# Patient Record
Sex: Male | Born: 1940 | Race: White | Hispanic: No | Smoking: Former smoker
Health system: Southern US, Community
[De-identification: ages and names within clinical notes are randomized; demographics above are authoritative.]

## PROBLEM LIST (undated history)

## (undated) DIAGNOSIS — I723 Aneurysm of iliac artery: Secondary | ICD-10-CM

## (undated) DIAGNOSIS — I10 Essential (primary) hypertension: Secondary | ICD-10-CM

## (undated) DIAGNOSIS — I6529 Occlusion and stenosis of unspecified carotid artery: Secondary | ICD-10-CM

## (undated) DIAGNOSIS — I219 Acute myocardial infarction, unspecified: Secondary | ICD-10-CM

## (undated) DIAGNOSIS — I2510 Atherosclerotic heart disease of native coronary artery without angina pectoris: Secondary | ICD-10-CM

## (undated) DIAGNOSIS — I779 Disorder of arteries and arterioles, unspecified: Secondary | ICD-10-CM

## (undated) DIAGNOSIS — K635 Polyp of colon: Secondary | ICD-10-CM

## (undated) DIAGNOSIS — I739 Peripheral vascular disease, unspecified: Secondary | ICD-10-CM

## (undated) DIAGNOSIS — E785 Hyperlipidemia, unspecified: Secondary | ICD-10-CM

## (undated) DIAGNOSIS — J449 Chronic obstructive pulmonary disease, unspecified: Secondary | ICD-10-CM

## (undated) HISTORY — DX: Peripheral vascular disease, unspecified: I73.9

## (undated) HISTORY — PX: HIATAL HERNIA REPAIR: SHX195

## (undated) HISTORY — DX: Essential (primary) hypertension: I10

## (undated) HISTORY — DX: Occlusion and stenosis of unspecified carotid artery: I65.29

## (undated) HISTORY — DX: Aneurysm of iliac artery: I72.3

## (undated) HISTORY — DX: Hyperlipidemia, unspecified: E78.5

## (undated) HISTORY — DX: Acute myocardial infarction, unspecified: I21.9

## (undated) HISTORY — DX: Disorder of arteries and arterioles, unspecified: I77.9

## (undated) HISTORY — DX: Polyp of colon: K63.5

---

## 2003-07-18 ENCOUNTER — Ambulatory Visit (HOSPITAL_COMMUNITY): Admission: RE | Admit: 2003-07-18 | Discharge: 2003-07-18 | Payer: Self-pay | Admitting: Internal Medicine

## 2008-05-31 ENCOUNTER — Encounter: Payer: Self-pay | Admitting: Gastroenterology

## 2008-05-31 ENCOUNTER — Encounter (INDEPENDENT_AMBULATORY_CARE_PROVIDER_SITE_OTHER): Payer: Self-pay | Admitting: *Deleted

## 2008-06-27 ENCOUNTER — Encounter: Payer: Self-pay | Admitting: Internal Medicine

## 2008-06-27 ENCOUNTER — Ambulatory Visit: Payer: Self-pay | Admitting: Gastroenterology

## 2008-06-27 DIAGNOSIS — F1010 Alcohol abuse, uncomplicated: Secondary | ICD-10-CM | POA: Insufficient documentation

## 2008-06-27 DIAGNOSIS — R161 Splenomegaly, not elsewhere classified: Secondary | ICD-10-CM | POA: Insufficient documentation

## 2008-06-27 DIAGNOSIS — Z860100 Personal history of colon polyps, unspecified: Secondary | ICD-10-CM | POA: Insufficient documentation

## 2008-06-27 DIAGNOSIS — I10 Essential (primary) hypertension: Secondary | ICD-10-CM | POA: Insufficient documentation

## 2008-06-27 DIAGNOSIS — E785 Hyperlipidemia, unspecified: Secondary | ICD-10-CM | POA: Insufficient documentation

## 2008-06-29 ENCOUNTER — Encounter: Payer: Self-pay | Admitting: Internal Medicine

## 2008-07-11 ENCOUNTER — Ambulatory Visit (HOSPITAL_COMMUNITY): Admission: RE | Admit: 2008-07-11 | Discharge: 2008-07-11 | Payer: Self-pay | Admitting: Internal Medicine

## 2008-07-11 ENCOUNTER — Encounter: Payer: Self-pay | Admitting: Internal Medicine

## 2008-07-11 ENCOUNTER — Ambulatory Visit: Payer: Self-pay | Admitting: Internal Medicine

## 2008-07-12 ENCOUNTER — Encounter: Payer: Self-pay | Admitting: Internal Medicine

## 2008-07-26 ENCOUNTER — Telehealth: Payer: Self-pay | Admitting: Gastroenterology

## 2008-07-27 ENCOUNTER — Encounter: Payer: Self-pay | Admitting: Internal Medicine

## 2008-08-02 ENCOUNTER — Ambulatory Visit (HOSPITAL_COMMUNITY): Admission: RE | Admit: 2008-08-02 | Discharge: 2008-08-02 | Payer: Self-pay | Admitting: Internal Medicine

## 2009-04-12 ENCOUNTER — Ambulatory Visit: Payer: Self-pay | Admitting: Vascular Surgery

## 2009-04-23 ENCOUNTER — Ambulatory Visit: Payer: Self-pay | Admitting: Vascular Surgery

## 2009-04-23 ENCOUNTER — Ambulatory Visit (HOSPITAL_COMMUNITY)
Admission: RE | Admit: 2009-04-23 | Discharge: 2009-04-23 | Payer: Self-pay | Source: Home / Self Care | Admitting: Vascular Surgery

## 2009-04-23 HISTORY — PX: OTHER SURGICAL HISTORY: SHX169

## 2009-05-10 ENCOUNTER — Ambulatory Visit: Payer: Self-pay | Admitting: Vascular Surgery

## 2009-11-13 ENCOUNTER — Ambulatory Visit: Payer: Self-pay | Admitting: Vascular Surgery

## 2010-04-30 NOTE — Progress Notes (Signed)
Summary: large liver  ---- Converted from flag ---- ---- 07/26/2008 10:02 AM, Cloria Spring LPN wrote: Dr. Sherryll Burger left vm and asked for copy of Abd [place], lab work, and wanted to  know if we will be following up on pt's enlarged liver. I faxed copy of the  [place] to Dr. Sherryll Burger. I do not see that labs were ordered. ------------------------------  Yes, we will f/u on enlarged Right hepatic lobe.  We did not do any labs here.  Discussed with Dr. Jena Gauss, he recommends CT a/p with IV/Oral contrast for further evaluation of enlarged Right hepatic lobe in setting of etoh use. Please let Dr. Margaretmary Eddy nurse know.  Spoke with patient and wife.  Patient cut back alcohol to 2 beers per day, goal of no alcohol. Please schedule CT A/P with IV/ORAL contrast, patient not available May 12-16, May 26, June 5-12.  He will need f/u OV with RMR to go over results.    Appended Document: large liver Dr. Sherryll Burger aware- didnt receive U/S fax. Faxed U/S report again. He is requesting a copy of CT when available as well.   Appended Document: large liver Pt scheduled for CT on 07/31/08 at 1:00 pm

## 2010-06-17 LAB — POCT I-STAT, CHEM 8
BUN: 19 mg/dL (ref 6–23)
Calcium, Ion: 1.14 mmol/L (ref 1.12–1.32)
Chloride: 103 mEq/L (ref 96–112)
Creatinine, Ser: 1 mg/dL (ref 0.4–1.5)
Glucose, Bld: 104 mg/dL — ABNORMAL HIGH (ref 70–99)
HCT: 35 % — ABNORMAL LOW (ref 39.0–52.0)
Hemoglobin: 11.9 g/dL — ABNORMAL LOW (ref 13.0–17.0)
Potassium: 4.3 mEq/L (ref 3.5–5.1)
Sodium: 134 mEq/L — ABNORMAL LOW (ref 135–145)
TCO2: 26 mmol/L (ref 0–100)

## 2010-07-09 LAB — CREATININE, SERUM
Creatinine, Ser: 0.97 mg/dL (ref 0.4–1.5)
GFR calc Af Amer: >60 mL/min (ref >60)
GFR calc non Af Amer: >60 mL/min (ref >60)

## 2010-08-13 NOTE — Procedures (Signed)
CAROTID DUPLEX EXAM   INDICATION:  Evaluate carotid artery disease.   HISTORY:  Diabetes:  No.  Cardiac:  No.  Hypertension:  Yes.  Smoking:  Previous.  Previous Surgery:  No.  CV History:  No.  Amaurosis Fugax No, Paresthesias No, Hemiparesis No                                       RIGHT             LEFT  Brachial systolic pressure:         146               155  Brachial Doppler waveforms:         Triphasic         Triphasic  Vertebral direction of flow:        Antegrade         Antegrade  DUPLEX VELOCITIES (cm/sec)  CCA peak systolic                   101               101  ECA peak systolic                   242               197  ICA peak systolic                   215 mid           126 mid  ICA end diastolic                   77                52  PLAQUE MORPHOLOGY:                  Calcific          Heterogeneous  PLAQUE AMOUNT:                      Moderate          Moderate  PLAQUE LOCATION:                    ICA / ECA           ICA / ECA   IMPRESSION:  1. The right internal carotid artery suggests 60%-79% stenosis.  The      proximal internal carotid artery was not imaged due to shadowing.  2. Left internal carotid artery suggests 40%-59% stenosis.  3. Bilateral external carotid artery stenosis.  4. Antegrade flow in bilateral vertebrals.   ___________________________________________  Di Kindle. Edilia Bo, M.D.   CB/MEDQ  D:  05/10/2009  T:  05/10/2009  Job:  [id number]

## 2010-08-13 NOTE — Procedures (Signed)
CAROTID DUPLEX EXAM   INDICATION:  Follow up known carotid disease.   HISTORY:  Diabetes:  No.  Cardiac:  No.  Hypertension:  Yes.  Smoking:  Previous.  Previous Surgery:  No.  CV History:  Asymptomatic.  Amaurosis Fugax No, Paresthesias No, Hemiparesis No                                       RIGHT             LEFT  Brachial systolic pressure:         182               181  Brachial Doppler waveforms:         Normal.           Normal.  Vertebral direction of flow:        Antegrade.        Antegrade.  DUPLEX VELOCITIES (cm/sec)  CCA peak systolic                   95                104  ECA peak systolic                   193               230  ICA peak systolic                   205               128  ICA end diastolic                   67                55  PLAQUE MORPHOLOGY:                  Calcific          Calcific  PLAQUE AMOUNT:                      Moderate to severe                  Moderate  PLAQUE LOCATION:                    ICA/ECA           ICA/ECA   IMPRESSION:  1. Doppler velocities suggest 60-79% stenosis in the right ICA;      however, difficult to image due to calcific shadowing and high      bifurcation.  2. Doppler velocities suggest 40-59% stenosis in the left ICA.  3. Antegrade flow noted in the bilateral vertebral arteries.  4. No significant changes from previous exam.   ___________________________________________  Di Kindle. Edilia Bo, M.D.   NT/MEDQ  D:  11/13/2009  T:  11/13/2009  Job:  [id number]

## 2010-08-13 NOTE — Op Note (Signed)
NAME:  William [name], William [name]               ACCOUNT NO.:  [id number]   MEDICAL RECORD NO.:  [id number]          PATIENT TYPE:  AMB   LOCATION:  DAY                           FACILITY:  [facility]   PHYSICIAN:  R. Roetta Sessions, M.D. DATE OF BIRTH:  1940-08-24   DATE OF PROCEDURE:  07/10/2008  DATE OF DISCHARGE:                               OPERATIVE REPORT   PROCEDURE:  Colonoscopy with biopsy.   INDICATIONS FOR PROCEDURE:  A 69 year old gentleman with a history  colonic adenomas.  Last colonoscopy was 5 years ago.  He is devoid of  any lower GI tract symptoms.  Now, he is here for surveillance.  The  risks, benefits, alternatives and limitations have been reviewed,  questions answered.  Please see the documentation in the medical record.   PROCEDURE NOTE:  O2 saturation, blood pressure, pulse and respirations  were monitored throughout the entire procedure.  Conscious sedation;  Versed 4 mg IV, Demerol 75 mg IV in divided doses.   INSTRUMENT:  Pentax video chip system.   FINDINGS:  Digital rectal exam revealed no abnormalities.   ENDOSCOPIC FINDINGS:  The prep was adequate.  Colon:  The colonic mucosa  was surveyed from the rectosigmoid junction through the left transverse  and right colon, appendiceal orifice, ileocecal valve and cecum.  These  structures well seen and photographed for the record.  From this level,  the scope was slowly and cautiously withdrawn.  All previous mentioned  mucosal surfaces were again seen.  The patient was noted to have a  pancolonic diverticula and three diminutive polyps in the mid sigmoid.  The remainder colonic mucosa appeared normal.  These polyps were cold  biopsy/removed.  The scope was pulled down the rectum where a thorough  examination of the rectal mucosa, including a retroflexed view of the  anal verge demonstrated no abnormalities.  The patient tolerated the  procedure well and was reacted in endoscopy.  Cecal withdrawal time 9  minutes.   IMPRESSION:  1. Normal rectum.  2. Diminutive sigmoid polyp, status post cold biopsy removal.  3. Pancolonic diverticula.  The remaining colonic mucosa appeared      normal.   RECOMMENDATIONS:  1. Diverticulosis literature provided to Mr. Heckmann.  2. Follow-up on path.  3. Further recommendations to follow.      Jonathon Bellows, M.D.  Electronically Signed     RMR/MEDQ  D:  07/11/2008  T:  07/11/2008  Job:  [id number]   cc:   Kirstie Peri, MD  Fax: [phone]

## 2010-08-13 NOTE — Assessment & Plan Note (Signed)
OFFICE VISIT   William [name], William [name]  DOB:  [birth year]                                       05/10/2009  ZOXWR#:[id number]   I saw the patient in the office today for continued follow-up of his  peripheral vascular disease.  This is a pleasant 70 year old gentleman  who I had seen in consultation on April 12, 2009 with bilateral lower  extremity claudication.  His symptoms have progressed significantly and  he had an ABI of 50% on the right and 40% on the left.  Therefore we  pursued arteriography to further evaluate his peripheral vascular  disease.  He underwent an arteriogram on April 23, 2009.  He comes in  for follow-up visit.  He states that actually his symptoms in his legs  have improved slightly especially on the right side.  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  He states that he can walk approximately a quarter of mile  before experiencing bilateral calf claudication symptoms.  His symptoms  are more significant on the left side at this point.  He has no  significant thigh or hip claudication.  He has no significant rest pain.  He can now walk further than when he was initially evaluated in January.  The symptoms are aggravated by walking and alleviated by rest.  There  are no other associated symptoms.  Of note, he also has had a carotid  duplex scan today as he had a left carotid bruit previously.  He has no  history of stroke, TIAs, expressive or receptive aphasia, or amaurosis  fugax.   SOCIAL HISTORY:  He is married, he has two children.  He quit tobacco in  1993.   REVIEW OF SYSTEMS:  CARDIOVASCULAR:  He has had no recent chest pain,  chest pressure, palpitations or arrhythmias.  He has had no history of  stroke or TIAs.  He has had no DVT or phlebitis.  PULMONARY:  He has had no productive cough, bronchitis, asthma or  wheezing recently.   PHYSICAL EXAMINATION:  This is a  pleasant 70 year old gentleman who  appears stated age.  Blood pressure 157/64, heart rate is 80,  temperature is 98.2.  Lungs are clear bilaterally to auscultation  without rales, rhonchi or wheezing.  Cardiovascular examination; he has  a left carotid bruit.  He has a regular rate and rhythm without murmur  appreciated.  He has palpable femoral pulses.  I cannot palpate  popliteal or pedal pulses on either side.  He has no significant lower  extremity swelling.  Abdomen:  Soft and nontender with normal pitched  bowel sounds.  No masses appreciated.  Neurologic:  He has no focal  weakness or paresthesias.   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  He had some  moderate superficial femoral artery occlusive disease on the right with  reconstitution of the below knee popliteal artery and three-vessel  runoff.  On the left side he had more diffuse disease with a long  segment superficial femoral artery occlusion.  There was reconstitution  of the distal SFA in above knee popliteal artery which had moderate  diffuse disease,  but then a reoccluded popliteal artery at the level of  the knee and only reconstituted his distal posterior tibial on the left.   Carotid duplex scan was done today.  I independently interpreted it and  this shows a 60%-79% right carotid stenosis with a 40%-59% left carotid  stenosis.  Both vertebral arteries are patent with normally directed  flow and arm pressures are essentially equal.   With respect to his carotid stenosis he understands we would not  consider carotid endarterectomy unless the stenosis progressed to  greater than 80% or he developed new neurologic symptoms.  I have  ordered follow-up duplex scan in 6 months and I will see him back at  that time.  He does know to continue taking his  aspirin.   With respect to his peripheral vascular disease, his symptoms have  actually improved and I have encouraged him to do as much walking as  possible.  If symptoms progress on the left side, he could potentially  require a fem to distal posterior tibial bypass versus bypass into a  blind above knee popliteal segment.  However, currently his symptoms are  improving and hopefully will continue to improve with continued  structured walking program.   I will see him back in 6 months with follow-up ABIs and carotid duplex  scan.  He knows to call sooner if he has problems.     Di Kindle. Edilia Bo, M.D.  Electronically Signed   CSD/MEDQ  D:  05/10/2009  T:  05/11/2009  Job:  [id number]   cc:   Kirstie Peri, MD

## 2010-08-13 NOTE — Consult Note (Signed)
NEW PATIENT CONSULTATION   William [name], William [name]  DOB:  02-25-1941                                       04/12/2009  ZOXWR#:[id number]   I saw the patient in the office today in consultation concerning his  bilateral lower extremity claudication.  He was referred by Dr. Sherryll Burger.  I  had actually seen him in the past with bilateral extremity claudication  which was quite stable and he was last seen in October of 2007.  He was  then lost to followup.  He presents now with a 4-5 year history of  bilateral calf claudication which is equal on both sides.  He has no  significant thigh or hip claudication.  His symptoms occur at  approximately 1 block and are brought on by ambulation and relieved with  rest.  There are no other aggravating or alleviating factors.  There are  no associated symptoms.  Over the last several months the claudication  symptoms have gradually progressed and he can only walk a short  distance.  He has had no history of rest pain and no history of  nonhealing ulcers.   PAST MEDICAL HISTORY:  Is significant for type 2 diabetes, hypertension  and hypercholesterolemia.  He denies any history of previous myocardial  infarction, history of congestive heart failure or history of COPD.   FAMILY HISTORY:  His father died from a pulmonary embolus.  He is  unaware of any history of premature cardiovascular disease.   SOCIAL HISTORY:  He is married.  He has two children.  He works as an  Theatre manager.  He quit tobacco in August of 1993.  He has two to  four drinks a day.   MEDICATIONS:  Are documented on the medical history form in his chart.  He is on aspirin.   REVIEW OF SYSTEMS:  GENERAL:  He has had some weight gain recently  because he has been unable to walk because of his claudication symptoms.  He has had no change in his appetite.  He is 165 pounds and 5 feet 7  inches tall.  CARDIOVASCULAR:  He has had no chest pain, chest pressure,  palpitations  or arrhythmias.  He has had no history of stroke, TIAs or amaurosis  fugax.  He has had no history of DVT or phlebitis.  Pulmonary, GI, GU, neurologic, musculoskeletal, psychiatric, ENT,  hematologic and integumentary review of systems is unremarkable and is  documented on the medical history form in his chart.   PHYSICAL EXAMINATION:  General:  This is a pleasant 70 year old  gentleman who appears his stated age.  Vital signs:  Heart rate is 72,  blood pressure 158/81, sat is 94%.  HEENT:  Pupils are equal, round and  reactive to light and accommodation.  Extraocular motions are intact.  Conjunctivae are normal.  Neck:  Neck is supple with no jugular venous  distention.  Lungs:  Are clear bilaterally to auscultation without  rales, rhonchi or wheezing.  Cardiovascular:  He has a soft left carotid  bruit.  He has a regular rate and rhythm without murmur or gallop  appreciated.  He has palpable radial pulses.  He has a slightly  diminished right femoral pulse and a normal left femoral pulse.  Cannot  palpate popliteal or pedal pulses on either side.  Abdomen:  Soft and  nontender with normal pitched bowel sounds.  No masses appreciated.  I  cannot appreciate an aneurysm.  Musculoskeletal:  He has no major  deformities or cyanosis.  Neurological:  He has no focal weakness or  paresthesias.  Lymphatics:  He has no significant cervical, axillary or  inguinal lymphadenopathy.  Skin:  He has no ulcers or rashes.   I have reviewed his arterial Doppler study which was done at [facility] and this shows an ABI of 50% on the right and 40% on the left.  He has diffuse monophasic waveforms throughout the right lower extremity  with monophasic waveforms in the right groin consistent with some  proximal iliac disease.  On the left side he has a triphasic signal in  the groin with dampened signals below this.   Given the progression of his symptoms and now with an ABI of 50% on  the  right and 40% on the left I think he has had progression of his  peripheral vascular disease.  I suspect he has proximal iliac artery  occlusive disease on the right and perhaps some infrainguinal disease  below this.  On the left side I think he has predominantly infrainguinal  arterial occlusive disease.  His symptoms are preventing him from  working some and also caused him to gain some weight because he is  unable to ambulate which he used to do quite a bit.  For this reason he  would like to pursue arteriography to see what options he has for  revascularization.  We have discussed the indications for arteriography  and the potential complications including but not limited to bleeding,  arterial injury or renal insufficiency.  In addition, if he has an iliac  lesion amenable to angioplasty I have explained that we could  potentially address this at the same time.  We have discussed the  indications for iliac angioplasty and potential stenting and the  potential complications including but not limited to arterial injury,  dissection or arterial thrombosis.  All of his questions were answered  and he is agreeable to proceed.  The procedure has been scheduled for  04/23/2009.  We will make further recommendations pending these results.  If he does have an iliac lesion amenable to angioplasty and stenting  then he will probably need to be on Plavix for 3-4 months after.  In  addition, when he comes in for his next visit he will need to have a  carotid duplex scan to follow up his left carotid bruit.     Di Kindle. Edilia Bo, M.[name].  Electronically Signed   CSD/MEDQ  [name]:  04/12/2009  T:  04/13/2009  Job:  [id number]   cc:   Kirstie Peri, MD

## 2010-08-16 NOTE — Op Note (Signed)
NAME:  William [name], William [name]                         ACCOUNT NO.:  [id number]   MEDICAL RECORD NO.:  [id number]                   PATIENT TYPE:  AMB   LOCATION:  DAY                                  FACILITY:  [facility]   PHYSICIAN:  R. Roetta Sessions, M.D.              DATE OF BIRTH:  [birth year]   DATE OF PROCEDURE:  07/18/2003  DATE OF DISCHARGE:                                 OPERATIVE REPORT   PROCEDURE:  Colonoscopy and snare polypectomy.   ENDOSCOPIST:  Gerrit Friends. Rourk, M.D.   INDICATIONS FOR PROCEDURE:  The patient is a 70 year old gentleman sent over  by Dr. Sherryll Burger for colonoscopy for cancer screening purposes.  He had  sigmoidoscopy with Dr. Angela Nevin in [place], 5 years ago.  He was told that  he had polyps but did not require colonoscopy.  He is devoid of any lower GI  tract symptoms.  There is no family history of colorectal neoplasia.  Colonoscopy is now being done.  This approach has been discussed with the  patient at length.  The potential risks, benefits, and alternatives have  been reviewed; questions answered.  Please see my handwritten H&P.   PROCEDURE NOTE:  O2 saturation, blood pressure, pulse and respirations were  monitored throughout the entirety of the procedure.  Conscious sedation:  Versed 3 mg IV, Demerol 50 mg IV in divided doses.   INSTRUMENT:  Olympus adult __________ colonoscope.   FINDINGS:  A digital rectal exam revealed no abnormalities.   ENDOSCOPIC FINDINGS:  The prep was good.   RECTUM:  Examination of the rectal mucosa including the retroflex view of  the anal verge revealed no abnormalities.   COLON:  The colonic mucosa was surveyed from the rectosigmoid junction  through the left transverse and right colon to the area of the appendiceal  orifice, ileocecal valve, and cecum.  These structures were well seen and  photographed for the record.   From this level the scope was slowly withdrawn.  All previously mentioned  mucosal surfaces were again  seen.  The patient was noted to have left-sided  and right-sided diverticula.  There was a 1.5 cm pedunculated polyp on a  fold just immediately distal to the ileocecal valve.  There were 2 smaller 6  mm polyps on opposite walls at the hepatic flexure.  The 1.5 cm polyp, just  distal to the ileocecal valve, was removed with a single pass of snare  cautery.  Two cc of sterile saline was used to lift the polyp away from the  colonic wall prior to polypectomy.  It was recovered with a Lucina Mellow net through  the scope.  The 2 smaller polyps at the hepatic flexure were cold snared.  There was a questionable, small fleshy sessile polyp between the 2 folds in  the midascending colon which could not be confirmed.  The remainder of the  colonic mucosa appeared normal.   The patient  tolerated the procedure well and was reacted in endoscopy.   IMPRESSION:  1. Normal rectum.  2. Left-sided and right-sided diverticula.  3. Polyps at the hepatic flexure and proximal right colon as described     above.  Removed with a snare.  4. Persisting small, sessile polyp at midright colon could not be confirmed.  5. The remainder of the colonic mucosa appeared normal.   RECOMMENDATIONS:  1. No aspirin or arthritis medications for the next 10 days.  2. Follow up on path.  3. Diverticulosis literature provided to Mr. Nanna.  4. Further recommendations to follow.      ___________________________________________                                            Jonathon Bellows, M.D.   RMR/MEDQ  D:  07/18/2003  T:  07/18/2003  Job:  [id number]   cc:   R. Roetta Sessions, M.D.  [address]  [place]  [place] [address]  Fax: [phone]   Kirstie Peri, MD  [address]  [place] [address]  Fax: [phone]

## 2010-11-19 ENCOUNTER — Encounter (INDEPENDENT_AMBULATORY_CARE_PROVIDER_SITE_OTHER): Payer: Medicare Other

## 2010-11-19 ENCOUNTER — Other Ambulatory Visit (INDEPENDENT_AMBULATORY_CARE_PROVIDER_SITE_OTHER): Payer: Medicare Other

## 2010-11-19 DIAGNOSIS — Z48812 Encounter for surgical aftercare following surgery on the circulatory system: Secondary | ICD-10-CM

## 2010-11-19 DIAGNOSIS — I6529 Occlusion and stenosis of unspecified carotid artery: Secondary | ICD-10-CM

## 2010-11-19 DIAGNOSIS — I70219 Atherosclerosis of native arteries of extremities with intermittent claudication, unspecified extremity: Secondary | ICD-10-CM

## 2010-11-28 NOTE — Procedures (Unsigned)
CAROTID DUPLEX EXAM  INDICATION:  Carotid disease.  HISTORY: Diabetes:  No Cardiac:  No Hypertension:  Yes Smoking:  Previous Previous Surgery:  No CV History:  Currently asymptomatic Amaurosis Fugax No, Paresthesias No, Hemiparesis No                                      RIGHT             LEFT Brachial systolic pressure:         157               153 Brachial Doppler waveforms:         Antegrade         Antegrade Vertebral direction of flow: DUPLEX VELOCITIES (cm/sec) CCA peak systolic                   81                82 ECA peak systolic                   161               224 ICA peak systolic                   172               119 ICA end diastolic                   56                40 PLAQUE MORPHOLOGY:                  Calcific          Calcific PLAQUE AMOUNT:                      Moderate          Moderate PLAQUE LOCATION:                    ICA/ECA/CCA       ICA/ECA/CCA  IMPRESSION: 1. Doppler velocities suggest 40% to 59% stenoses of the bilateral     proximal internal carotid arteries.  However, the percentage of     stenosis of the right internal carotid artery may be underestimated     due to calcific plaque shadowing. 2. Mild decrease of the right internal carotid artery velocities noted     when compared to the previous exam on 11/13/2009 with the left     internal carotid artery remaining stable.  ___________________________________________ Di Kindle. Edilia Bo, M.D.  CH/MEDQ  D:  11/20/2010  T:  11/20/2010  Job:  [id number]

## 2010-11-28 NOTE — Procedures (Unsigned)
AORTA-ILIAC DUPLEX EVALUATION  INDICATION:  Right external iliac artery stent.  HISTORY: Diabetes:  No. Cardiac:  No. Hypertension:  Yes. Smoking:  Previous. Previous Surgery:  Right external iliac artery stent on 04/23/2009.              SINGLE LEVEL ARTERIAL EXAM                             RIGHT                  LEFT Brachial: Anterior tibial: Posterior tibial: Peroneal: Ankle/brachial index: Previous ABI/date:  AORTA-ILIAC DUPLEX EXAM Aorta - Proximal     35 cm/s Aorta - Mid          52 cm/s Aorta - Distal       70 cm/s  RIGHT                                   LEFT 138 cm/s          CIA-PROXIMAL 90 cm/s           CIA-DISTAL                   HYPOGASTRIC 74 cm/s           EIA-PROXIMAL 73 cm/s           EIA-MID 105 cm/s          EIA-DISTAL  IMPRESSION: 1. Patent right external iliac artery stent with no increase in     Doppler velocities noted. 2. Bilateral ankle brachial indices noted on separate report.  ___________________________________________ Di Kindle. Edilia Bo, M.D.  CH/MEDQ  D:  11/20/2010  T:  11/20/2010  Job:  [id number]

## 2011-11-26 ENCOUNTER — Other Ambulatory Visit: Payer: Medicare Other

## 2011-11-26 ENCOUNTER — Ambulatory Visit: Payer: Medicare Other | Admitting: Vascular Surgery

## 2011-12-02 ENCOUNTER — Encounter: Payer: Self-pay | Admitting: Neurosurgery

## 2011-12-03 ENCOUNTER — Other Ambulatory Visit (INDEPENDENT_AMBULATORY_CARE_PROVIDER_SITE_OTHER): Payer: Medicare Other | Admitting: *Deleted

## 2011-12-03 ENCOUNTER — Ambulatory Visit (INDEPENDENT_AMBULATORY_CARE_PROVIDER_SITE_OTHER): Payer: Medicare Other | Admitting: Neurosurgery

## 2011-12-03 ENCOUNTER — Encounter: Payer: Self-pay | Admitting: Neurosurgery

## 2011-12-03 ENCOUNTER — Encounter (INDEPENDENT_AMBULATORY_CARE_PROVIDER_SITE_OTHER): Payer: Medicare Other | Admitting: *Deleted

## 2011-12-03 ENCOUNTER — Ambulatory Visit (INDEPENDENT_AMBULATORY_CARE_PROVIDER_SITE_OTHER): Payer: Medicare Other | Admitting: *Deleted

## 2011-12-03 VITALS — BP 130/84 | HR 68 | Resp 18 | Ht 66.0 in | Wt 168.5 lb

## 2011-12-03 DIAGNOSIS — I739 Peripheral vascular disease, unspecified: Secondary | ICD-10-CM

## 2011-12-03 DIAGNOSIS — Z48812 Encounter for surgical aftercare following surgery on the circulatory system: Secondary | ICD-10-CM

## 2011-12-03 DIAGNOSIS — I6529 Occlusion and stenosis of unspecified carotid artery: Secondary | ICD-10-CM

## 2011-12-03 NOTE — Progress Notes (Signed)
[facility] [place] Carotid Office Note  CC: Iliac stent, carotid surveillance with ABIs Referring Physician: Edilia Bo  History of Present Illness: 71 year old male patient of Dr. Edilia Bo who status post right external iliac artery stent placement in January 2011. Patient states he can ambulate but developed some claudication type symptoms around 100 yards but is able to continue to complete his ADLs in life without interruption. The patient denies any signs or symptoms of CVA, TIA, amaurosis fugax or any neural deficit. The patient has no rest pain or open ulcerations in his lower extremities.  Past Medical History  Diagnosis Date  . Hypertension   . Hyperlipidemia   . Peripheral arterial disease   . Diabetes mellitus     Type II  . Colon polyp   . Carotid artery occlusion     ROS: [x]  Positive   [ ]  Denies    General: [ ]  Weight loss, [ ]  Fever, [ ]  chills Neurologic: [ ]  Dizziness, [ ]  Blackouts, [ ]  Seizure [ ]  Stroke, [ ]  "Mini stroke", [ ]  Slurred speech, [ ]  Temporary blindness; [ ]  weakness in arms or legs, [ ]  Hoarseness Cardiac: [ ]  Chest pain/pressure, [ ]  Shortness of breath at rest [ ]  Shortness of breath with exertion, [ ]  Atrial fibrillation or irregular heartbeat Vascular: [x ] Pain in legs with walking, [ ]  Pain in legs at rest, [ ]  Pain in legs at night,  [ ]  Non-healing ulcer, [ ]  Blood clot in vein/DVT,   Pulmonary: [ ]  Home oxygen, [x ] Productive cough, [ ]  Coughing up blood, [ ]  Asthma,  [ ]  Wheezing Musculoskeletal:  [ ]  Arthritis, [ ]  Low back pain, [ ]  Joint pain Hematologic: [ ]  Easy Bruising, [ ]  Anemia; [ ]  Hepatitis Gastrointestinal: [ ]  Blood in stool, [ ]  Gastroesophageal Reflux/heartburn, [ ]  Trouble swallowing Urinary: [ ]  chronic Kidney disease, [ ]  on HD - [ ]  MWF or [ ]  TTHS, [ ]  Burning with urination, [ ]  Difficulty urinating Skin: [ ]  Rashes, [ ]  Wounds Psychological: [ ]  Anxiety, [ ]  Depression   Social  History History  Substance Use Topics  . Smoking status: Former Smoker -- 2.0 packs/day    Types: Cigarettes    Quit date: 11/17/1991  . Smokeless tobacco: Not on file  . Alcohol Use: 0.0 oz/week    2-4 drink(s) per week    Family History Family History  Problem Relation Age of Onset  . Heart disease Father     Heart Disease before age 18  . Pulmonary embolism Father   . Deep vein thrombosis Father   . Cancer Mother     Not on File  Current Outpatient Prescriptions  Medication Sig Dispense Refill  . Ascorbic Acid (VITAMIN C) 1000 MG tablet Take 1,000 mg by mouth daily.      Marland Kitchen aspirin 81 MG tablet Take 81 mg by mouth daily.      Marland Kitchen atorvastatin (LIPITOR) 10 MG tablet Take 10 mg by mouth daily.      . fish oil-omega-3 fatty acids 1000 MG capsule Take 1,000 g by mouth 2 (two) times daily.      . folic acid (FOLVITE) 800 MCG tablet Take 400 mcg by mouth daily.      Marland Kitchen gabapentin (NEURONTIN) 300 MG capsule Take 300 mg by mouth 3 (three) times daily.      . Garlic 1250 MG TABS Take by mouth.      Marland Kitchen  irbesartan-hydrochlorothiazide (AVALIDE) 300-12.5 MG per tablet Take 1 tablet by mouth daily.      Marland Kitchen labetalol (NORMODYNE) 300 MG tablet Take 300 mg by mouth 2 (two) times daily.      . Multiple Vitamin (MULTIVITAMIN) tablet Take 1 tablet by mouth daily.      Marland Kitchen thiamine (VITAMIN B-1) 100 MG tablet Take 100 mg by mouth daily.      . vitamin E 400 UNIT capsule Take 400 Units by mouth daily.      . cloNIDine (CATAPRES) 0.1 MG tablet Take 0.1 mg by mouth 2 (two) times daily.      . clopidogrel (PLAVIX) 75 MG tablet Take 75 mg by mouth daily.      Marland Kitchen NIFEdipine (PROCARDIA XL/ADALAT-CC) 90 MG 24 hr tablet Take 90 mg by mouth daily.      Marland Kitchen olmesartan-hydrochlorothiazide (BENICAR HCT) 40-25 MG per tablet Take 1 tablet by mouth daily.      . simvastatin (ZOCOR) 20 MG tablet Take 20 mg by mouth every evening.        Physical Examination  Filed Vitals:   12/03/11 1143  BP: 130/84  Pulse: 68   Resp:     Body mass index is 27.20 kg/(m^2).  General:  WDWN in NAD Gait: Normal HEENT: WNL Eyes: Pupils equal Pulmonary: normal non-labored breathing , without Rales, rhonchi,  wheezing Cardiac: RRR, without  Murmurs, rubs or gallops; Abdomen: soft, NT, no masses Skin: no rashes, ulcers noted  Vascular Exam Pulses: 2+ radial pulses bilaterally, femoral pulses are palpable bilaterally lower extremity pulses are not palpable but the lower stream these are well-perfused. Carotid bruits: Carotid pulses to auscultation no bruits are heard Extremities without ischemic changes, no Gangrene , no cellulitis; no open wounds;  Musculoskeletal: no muscle wasting or atrophy   Neurologic: A&O X 3; Appropriate Affect ; SENSATION: normal; MOTOR FUNCTION:  moving all extremities equally. Speech is fluent/normal  Non-Invasive Vascular Imaging CAROTID DUPLEX 12/03/2011  Right ICA 40 - 59 % stenosis Left ICA 20 - 39 % stenosis ABIs today are 0.56 and biphasic to monophasic on the right, 0.53 and monophasic on the left, aortoiliac duplex shows a patent right external iliac artery with 1 increased velocity of 203 cm/s noted at the distal right EIA/CFA  ASSESSMENT/PLAN: This is a patient with multiple vascular issues overall doing fairly well, the patient will followup in 6 months for repeat ABIs and iliac stent duplex in one year for repeat carotid duplex. The patient's questions were encouraged and answered, he is in agreement with this plan.   [name] ANP   Clinic M.D.: Edilia Bo

## 2012-06-02 ENCOUNTER — Other Ambulatory Visit: Payer: Medicare Other

## 2012-06-02 ENCOUNTER — Ambulatory Visit: Payer: Medicare Other | Admitting: Neurosurgery

## 2012-06-30 ENCOUNTER — Ambulatory Visit: Payer: Medicare Other | Admitting: Neurosurgery

## 2012-06-30 ENCOUNTER — Other Ambulatory Visit (INDEPENDENT_AMBULATORY_CARE_PROVIDER_SITE_OTHER): Payer: Medicare Other | Admitting: *Deleted

## 2012-06-30 ENCOUNTER — Encounter (INDEPENDENT_AMBULATORY_CARE_PROVIDER_SITE_OTHER): Payer: Medicare Other | Admitting: *Deleted

## 2012-06-30 DIAGNOSIS — Z48812 Encounter for surgical aftercare following surgery on the circulatory system: Secondary | ICD-10-CM

## 2012-06-30 DIAGNOSIS — I739 Peripheral vascular disease, unspecified: Secondary | ICD-10-CM

## 2012-07-02 ENCOUNTER — Other Ambulatory Visit: Payer: Self-pay

## 2012-07-02 DIAGNOSIS — I739 Peripheral vascular disease, unspecified: Secondary | ICD-10-CM

## 2012-07-05 ENCOUNTER — Encounter: Payer: Self-pay | Admitting: Vascular Surgery

## 2012-12-08 ENCOUNTER — Other Ambulatory Visit: Payer: Self-pay

## 2012-12-08 ENCOUNTER — Ambulatory Visit: Payer: Medicare Other | Admitting: Neurosurgery

## 2012-12-08 ENCOUNTER — Other Ambulatory Visit (INDEPENDENT_AMBULATORY_CARE_PROVIDER_SITE_OTHER): Payer: Medicare Other | Admitting: *Deleted

## 2012-12-08 DIAGNOSIS — I6529 Occlusion and stenosis of unspecified carotid artery: Secondary | ICD-10-CM

## 2012-12-09 ENCOUNTER — Other Ambulatory Visit: Payer: Self-pay | Admitting: *Deleted

## 2012-12-10 ENCOUNTER — Encounter: Payer: Self-pay | Admitting: Vascular Surgery

## 2013-04-29 ENCOUNTER — Other Ambulatory Visit: Payer: Self-pay | Admitting: Vascular Surgery

## 2013-04-29 DIAGNOSIS — I739 Peripheral vascular disease, unspecified: Secondary | ICD-10-CM

## 2013-04-29 DIAGNOSIS — Z48812 Encounter for surgical aftercare following surgery on the circulatory system: Secondary | ICD-10-CM

## 2013-05-23 ENCOUNTER — Other Ambulatory Visit: Payer: Self-pay | Admitting: Vascular Surgery

## 2013-05-23 DIAGNOSIS — I6529 Occlusion and stenosis of unspecified carotid artery: Secondary | ICD-10-CM

## 2013-07-05 ENCOUNTER — Encounter: Payer: Self-pay | Admitting: Family

## 2013-07-06 ENCOUNTER — Ambulatory Visit (INDEPENDENT_AMBULATORY_CARE_PROVIDER_SITE_OTHER): Payer: Medicare Other | Admitting: Family

## 2013-07-06 ENCOUNTER — Ambulatory Visit (INDEPENDENT_AMBULATORY_CARE_PROVIDER_SITE_OTHER)
Admission: RE | Admit: 2013-07-06 | Discharge: 2013-07-06 | Disposition: A | Discharge status: Home / Self Care | Payer: Medicare Other | Source: Ambulatory Visit | Attending: Vascular Surgery | Admitting: Vascular Surgery

## 2013-07-06 ENCOUNTER — Encounter: Payer: Self-pay | Admitting: Family

## 2013-07-06 ENCOUNTER — Ambulatory Visit (HOSPITAL_COMMUNITY)
Admission: RE | Admit: 2013-07-06 | Discharge: 2013-07-06 | Disposition: A | Discharge status: Home / Self Care | Payer: Medicare Other | Source: Ambulatory Visit | Attending: Family | Admitting: Family

## 2013-07-06 VITALS — BP 118/84 | HR 61 | Resp 14 | Ht 67.0 in | Wt 174.0 lb

## 2013-07-06 DIAGNOSIS — Z48812 Encounter for surgical aftercare following surgery on the circulatory system: Secondary | ICD-10-CM

## 2013-07-06 DIAGNOSIS — I701 Atherosclerosis of renal artery: Secondary | ICD-10-CM | POA: Insufficient documentation

## 2013-07-06 DIAGNOSIS — I739 Peripheral vascular disease, unspecified: Secondary | ICD-10-CM | POA: Insufficient documentation

## 2013-07-06 DIAGNOSIS — I723 Aneurysm of iliac artery: Secondary | ICD-10-CM | POA: Insufficient documentation

## 2013-07-06 NOTE — Progress Notes (Signed)
[facility] [place] HISTORY AND PHYSICAL -PAD  History of Present Illness William [name] is a 73 y.o. male patient of Dr. Scot Dock who is status post right external iliac artery stent placement in January 2011. He returns today for surveillance of PAD. Claudication pain in both calves after walking about 400 feet, admits that he has not been walking much this Winter, states he will. He denies non healing wounds. He denies any history of stroke of TIA, denies steal symptoms in either arm, denies dizziness.  The patient denies New Medical or Surgical History.  Pt Diabetic: No Pt smoker: former smoker, quit 20 years ago  Pt meds include: Statin :Yes ASA: Yes Other anticoagulants/antiplatelets: Plavix  Past Medical History  Diagnosis Date  . Hypertension   . Hyperlipidemia   . Peripheral arterial disease   . Diabetes mellitus     Type II  . Colon polyp   . Carotid artery occlusion     Social History History  Substance Use Topics  . Smoking status: Former Smoker -- 2.00 packs/day    Types: Cigarettes    Quit date: 11/17/1991  . Smokeless tobacco: Never Used  . Alcohol Use: 0.0 oz/week    2-4 drink(s) per week    Family History Family History  Problem Relation Age of Onset  . Heart disease Father     Heart Disease before age 69  . Pulmonary embolism Father   . Deep vein thrombosis Father   . Cancer Mother     Past Surgical History  Procedure Laterality Date  . Hiatal hernia repair    . Percutaneous translumnial angioplasty  04/23/2009    Catheterization of [place] external iliac artery with Left lower extremity runoff  . Bilateral renal  artery stenoses  04/23/2009    Not on File  Current Outpatient Prescriptions  Medication Sig Dispense Refill  . Ascorbic Acid (VITAMIN C) 1000 MG tablet Take 1,000 mg by mouth daily.      Marland Kitchen aspirin 81 MG tablet Take 81 mg by mouth daily.      Marland Kitchen atorvastatin (LIPITOR) 10 MG tablet Take 10 mg by mouth daily.       . cloNIDine (CATAPRES) 0.1 MG tablet Take 0.1 mg by mouth 2 (two) times daily.      . clopidogrel (PLAVIX) 75 MG tablet Take 75 mg by mouth daily.      . fish oil-omega-3 fatty acids 1000 MG capsule Take 1,000 g by mouth 2 (two) times daily.      . folic acid (FOLVITE) 379 MCG tablet Take 400 mcg by mouth daily.      Marland Kitchen gabapentin (NEURONTIN) 300 MG capsule Take 300 mg by mouth 3 (three) times daily.      . Garlic 0240 MG TABS Take by mouth.      . irbesartan-hydrochlorothiazide (AVALIDE) 300-12.5 MG per tablet Take 1 tablet by mouth daily.      Marland Kitchen labetalol (NORMODYNE) 300 MG tablet Take 300 mg by mouth 2 (two) times daily.      . Multiple Vitamin (MULTIVITAMIN) tablet Take 1 tablet by mouth daily.      Marland Kitchen NIFEdipine (PROCARDIA XL/ADALAT-CC) 90 MG 24 hr tablet Take 90 mg by mouth daily.      Marland Kitchen olmesartan-hydrochlorothiazide (BENICAR HCT) 40-25 MG per tablet Take 1 tablet by mouth daily.      . simvastatin (ZOCOR) 20 MG tablet Take 20 mg by mouth every evening.      . thiamine (VITAMIN B-1)  100 MG tablet Take 100 mg by mouth daily.      . vitamin E 400 UNIT capsule Take 400 Units by mouth daily.       No current facility-administered medications for this visit.    ROS: See HPI for pertinent positives and negatives.   Physical Examination  Filed Vitals:   07/06/13 1035  BP: 118/84  Pulse: 61  Resp: 14   Filed Weights   07/06/13 1035  Weight: 174 lb (78.926 kg)   Body mass index is 27.25 kg/(m^2).   General: A&O x 3, WDWN. Gait: normal Eyes: PERRLA. Pulmonary: CTAB, without wheezes , rales or rhonchi. Cardiac: regular Rythm , without detected murmur.         Carotid Bruits Left Right   Negative Negative  Aorta is not palpable. Radial pulses: 2+ right, 1+ Left palpable                           VASCULAR EXAM: Extremities without ischemic changes  without Gangrene; without open wounds.                                                                                                           LE Pulses LEFT RIGHT       FEMORAL   palpable  not palpable        POPLITEAL  not palpable   not palpable       POSTERIOR TIBIAL  not palpable   not palpable        DORSALIS PEDIS      ANTERIOR TIBIAL not palpable  Not  palpable    Abdomen: soft, NT, no masses. Skin: no rashes, no ulcers noted. Musculoskeletal: no muscle wasting or atrophy.  Neurologic: A&O X 3; Appropriate Affect ; SENSATION: normal; MOTOR FUNCTION:  moving all extremities equally, motor strength 5/5 throughout. Speech is fluent/normal. CN 2-12 intact.    Non-Invasive Vascular Imaging: DATE: 07/06/2013 ILIAC ARTERY STENT EVALUATION    INDICATION: Follow-up right external iliac artery stent    PREVIOUS INTERVENTION(S): Right external iliac artery stent placed 04/23/2009    DUPLEX EXAM:     RIGHT  LEFT   Peak Systolic Velocity (cm/s) Ratio (if abnormal) Waveform  Peak Systolic Velocity (cm/s) Ratio (if abnormal) Waveform  59  B Aorta - Distal     183  T Artery - Proximal to Stent     108  T Stent - Proximal     78  B Stent - Mid     72  B Stent - Distal     69  B Artery - Distal to Stent     .82 Today's ABI / TBI .58  .70 Previous ABI / TBI (06/30/2012 ) .68    Waveform:    M - Monophasic       B - Biphasic       T - Triphasic  If Ankle Brachial Index (ABI) or Toe Brachial Index (TBI) performed, please see complete report     ADDITIONAL  FINDINGS:     IMPRESSION: 1. Widely patent right external iliac artery stent without evidence of restenosis or hyperplasia.  2. There is mild (<50%) disease observed in the right common iliac artery.    Compared to the previous exam:  No significant change compared to prior exam.      ASSESSMENT: William [name] is a 73 y.o. male who is status post right external iliac artery stent placement in January 2011. Widely patent right external iliac artery stent without evidence of restenosis or hyperplasia.  There is mild (<50%) disease observed in  the right common iliac artery 12/03/2011 Carotid Duplex demonstrated mild/moderate right ICA stenosis and minimal left ICA stenosis, no stroke or TIA history. He does not have buttocks or thigh claudication, but does have moderate bilateral calf claudication with ABI's indicating severe bilateral arterial occlusive disease, which means his stenoses are likely in both SFA's; will add bilateral LE arterial Duplex on his return in one year.  Consider intensive statin therapy which is associated with a greater reduction in CVD risk and improved endothelial function , will defer to PCP.  PLAN:  Graduated walking program. I discussed in depth with the patient the nature of atherosclerosis, and emphasized the importance of maximal medical management including strict control of blood pressure, blood glucose, and lipid levels, obtaining regular exercise, and continuedcessation of smoking.  The patient is aware that without maximal medical management the underlying atherosclerotic disease process will progress, limiting the benefit of any interventions.  Based on the patient's vascular studies and examination, pt will return to clinic in 1 year for carotid Duplex, ABI's, bilateral LE arterial Duplex, and right iliac artery Duplex. He was advised to return sooner should he develop non healing wounds or worsening claudication.  The patient was given information about PAD including signs, symptoms, treatment, what symptoms should prompt the patient to seek immediate medical care, and risk reduction measures to take.  Clemon Chambers, RN, MSN, FNP-C [facility] Phone: [phone]  Clinic MD: Early  07/06/2013 10:50 AM

## 2013-07-06 NOTE — Patient Instructions (Signed)
Peripheral Vascular Disease Peripheral Vascular Disease (PVD), also called Peripheral Arterial Disease (PAD), is a circulation problem caused by cholesterol (atherosclerotic plaque) deposits in the arteries. PVD commonly occurs in the lower extremities (legs) but it can occur in other areas of the body, such as your arms. The cholesterol buildup in the arteries reduces blood flow which can cause pain and other serious problems. The presence of PVD can place a person at risk for Coronary Artery Disease (CAD).  CAUSES  Causes of PVD can be many. It is usually associated with more than one risk factor such as:   High Cholesterol.  Smoking.  Diabetes.  Lack of exercise or inactivity.  High blood pressure (hypertension).  Obesity.  Family history. SYMPTOMS   When the lower extremities are affected, patients with PVD may experience:  Leg pain with exertion or physical activity. This is called INTERMITTENT CLAUDICATION. This may present as cramping or numbness with physical activity. The location of the pain is associated with the level of blockage. For example, blockage at the abdominal level (distal abdominal aorta) may result in buttock or hip pain. Lower leg arterial blockage may result in calf pain.  As PVD becomes more severe, pain can develop with less physical activity.  In people with severe PVD, leg pain may occur at rest.  Other PVD signs and symptoms:  Leg numbness or weakness.  Coldness in the affected leg or foot, especially when compared to the other leg.  A change in leg color.  Patients with significant PVD are more prone to ulcers or sores on toes, feet or legs. These may take longer to heal or may reoccur. The ulcers or sores can become infected.  If signs and symptoms of PVD are ignored, gangrene may occur. This can result in the loss of toes or loss of an entire limb.  Not all leg pain is related to PVD. Other medical conditions can cause leg pain such  as:  Blood clots (embolism) or Deep Vein Thrombosis.  Inflammation of the blood vessels (vasculitis).  Spinal stenosis. DIAGNOSIS  Diagnosis of PVD can involve several different types of tests. These can include:  Pulse Volume Recording Method (PVR). This test is simple, painless and does not involve the use of X-rays. PVR involves measuring and comparing the blood pressure in the arms and legs. An ABI (Ankle-Brachial Index) is calculated. The normal ratio of blood pressures is 1. As this number becomes smaller, it indicates more severe disease.  < 0.95  indicates significant narrowing in one or more leg vessels.  <0.8 there will usually be pain in the foot, leg or buttock with exercise.  <0.4 will usually have pain in the legs at rest.  <0.25  usually indicates limb threatening PVD.  Doppler detection of pulses in the legs. This test is painless and checks to see if you have a pulses in your legs/feet.  A dye or contrast material (a substance that highlights the blood vessels so they show up on x-ray) may be given to help your caregiver better see the arteries for the following tests. The dye is eliminated from your body by the kidney's. Your caregiver may order blood work to check your kidney function and other laboratory values before the following tests are performed:  Magnetic Resonance Angiography (MRA). An MRA is a picture study of the blood vessels and arteries. The MRA machine uses a large magnet to produce images of the blood vessels.  Computed Tomography Angiography (CTA). A CTA is a   specialized x-ray that looks at how the blood flows in your blood vessels. An IV may be inserted into your arm so contrast dye can be injected.  Angiogram. Is a procedure that uses x-rays to look at your blood vessels. This procedure is minimally invasive, meaning a small incision (cut) is made in your groin. A small tube (catheter) is then inserted into the artery of your groin. The catheter is  guided to the blood vessel or artery your caregiver wants to examine. Contrast dye is injected into the catheter. X-rays are then taken of the blood vessel or artery. After the images are obtained, the catheter is taken out. TREATMENT  Treatment of PVD involves many interventions which may include:  Lifestyle changes:  Quitting smoking.  Exercise.  Following a low fat, low cholesterol diet.  Control of diabetes.  Foot care is very important to the PVD patient. Good foot care can help prevent infection.  Medication:  Cholesterol-lowering medicine.  Blood pressure medicine.  Anti-platelet drugs.  Certain medicines may reduce symptoms of Intermittent Claudication.  Interventional/Surgical options:  Angioplasty. An Angioplasty is a procedure that inflates a balloon in the blocked artery. This opens the blocked artery to improve blood flow.  Stent Implant. A wire mesh tube (stent) is placed in the artery. The stent expands and stays in place, allowing the artery to remain open.  Peripheral Bypass Surgery. This is a surgical procedure that reroutes the blood around a blocked artery to help improve blood flow. This type of procedure may be performed if Angioplasty or stent implants are not an option. SEEK IMMEDIATE MEDICAL CARE IF:   You develop pain or numbness in your arms or legs.  Your arm or leg turns cold, becomes blue in color.  You develop redness, warmth, swelling and pain in your arms or legs. MAKE SURE YOU:   Understand these instructions.  Will watch your condition.  Will get help right away if you are not doing well or get worse. Document Released: 04/24/2004 Document Revised: 06/09/2011 Document Reviewed: 03/21/2008 [employer] Patient Information 2014 [employer].  

## 2013-07-08 ENCOUNTER — Other Ambulatory Visit: Payer: Self-pay

## 2013-07-08 DIAGNOSIS — I70219 Atherosclerosis of native arteries of extremities with intermittent claudication, unspecified extremity: Secondary | ICD-10-CM

## 2013-07-08 DIAGNOSIS — I7090 Unspecified atherosclerosis: Secondary | ICD-10-CM

## 2013-12-08 ENCOUNTER — Other Ambulatory Visit (HOSPITAL_COMMUNITY): Payer: Medicare Other

## 2013-12-08 ENCOUNTER — Ambulatory Visit: Payer: Medicare Other | Admitting: Family

## 2014-04-06 DIAGNOSIS — J449 Chronic obstructive pulmonary disease, unspecified: Secondary | ICD-10-CM | POA: Diagnosis not present

## 2014-04-07 DIAGNOSIS — I10 Essential (primary) hypertension: Secondary | ICD-10-CM | POA: Diagnosis not present

## 2014-04-07 DIAGNOSIS — E780 Pure hypercholesterolemia: Secondary | ICD-10-CM | POA: Diagnosis not present

## 2014-04-07 DIAGNOSIS — Z87891 Personal history of nicotine dependence: Secondary | ICD-10-CM | POA: Diagnosis not present

## 2014-04-07 DIAGNOSIS — R5383 Other fatigue: Secondary | ICD-10-CM | POA: Diagnosis not present

## 2014-04-07 DIAGNOSIS — Z418 Encounter for other procedures for purposes other than remedying health state: Secondary | ICD-10-CM | POA: Diagnosis not present

## 2014-04-07 DIAGNOSIS — Z6829 Body mass index (BMI) 29.0-29.9, adult: Secondary | ICD-10-CM | POA: Diagnosis not present

## 2014-04-07 DIAGNOSIS — Z0000 Encounter for general adult medical examination without abnormal findings: Secondary | ICD-10-CM | POA: Diagnosis not present

## 2014-04-07 DIAGNOSIS — Z125 Encounter for screening for malignant neoplasm of prostate: Secondary | ICD-10-CM | POA: Diagnosis not present

## 2014-07-11 ENCOUNTER — Encounter: Payer: Self-pay | Admitting: Family

## 2014-07-12 ENCOUNTER — Other Ambulatory Visit (HOSPITAL_COMMUNITY): Payer: Medicare Other

## 2014-07-12 ENCOUNTER — Ambulatory Visit: Payer: Medicare Other | Admitting: Family

## 2014-07-12 ENCOUNTER — Encounter: Payer: Self-pay | Admitting: Family

## 2014-07-12 ENCOUNTER — Encounter (HOSPITAL_COMMUNITY): Payer: Medicare Other

## 2014-07-12 ENCOUNTER — Ambulatory Visit (INDEPENDENT_AMBULATORY_CARE_PROVIDER_SITE_OTHER): Payer: Medicare Other | Admitting: Family

## 2014-07-12 ENCOUNTER — Ambulatory Visit (INDEPENDENT_AMBULATORY_CARE_PROVIDER_SITE_OTHER)
Admission: RE | Admit: 2014-07-12 | Discharge: 2014-07-12 | Disposition: A | Discharge status: Home / Self Care | Payer: Medicare Other | Source: Ambulatory Visit | Attending: Family | Admitting: Family

## 2014-07-12 ENCOUNTER — Ambulatory Visit (HOSPITAL_COMMUNITY)
Admission: RE | Admit: 2014-07-12 | Discharge: 2014-07-12 | Disposition: A | Discharge status: Home / Self Care | Payer: Medicare Other | Source: Ambulatory Visit | Attending: Family | Admitting: Family

## 2014-07-12 VITALS — BP 100/60 | HR 71 | Resp 16 | Ht 65.25 in | Wt 165.0 lb

## 2014-07-12 DIAGNOSIS — I739 Peripheral vascular disease, unspecified: Secondary | ICD-10-CM

## 2014-07-12 DIAGNOSIS — Z95828 Presence of other vascular implants and grafts: Secondary | ICD-10-CM

## 2014-07-12 DIAGNOSIS — Z87891 Personal history of nicotine dependence: Secondary | ICD-10-CM | POA: Diagnosis not present

## 2014-07-12 DIAGNOSIS — I70219 Atherosclerosis of native arteries of extremities with intermittent claudication, unspecified extremity: Secondary | ICD-10-CM | POA: Diagnosis not present

## 2014-07-12 DIAGNOSIS — I749 Embolism and thrombosis of unspecified artery: Secondary | ICD-10-CM

## 2014-07-12 DIAGNOSIS — Z48812 Encounter for surgical aftercare following surgery on the circulatory system: Secondary | ICD-10-CM | POA: Diagnosis not present

## 2014-07-12 DIAGNOSIS — I70201 Unspecified atherosclerosis of native arteries of extremities, right leg: Secondary | ICD-10-CM | POA: Diagnosis not present

## 2014-07-12 DIAGNOSIS — I701 Atherosclerosis of renal artery: Secondary | ICD-10-CM | POA: Insufficient documentation

## 2014-07-12 DIAGNOSIS — I6523 Occlusion and stenosis of bilateral carotid arteries: Secondary | ICD-10-CM | POA: Diagnosis not present

## 2014-07-12 DIAGNOSIS — Z9889 Other specified postprocedural states: Secondary | ICD-10-CM

## 2014-07-12 DIAGNOSIS — I7090 Unspecified atherosclerosis: Secondary | ICD-10-CM

## 2014-07-12 NOTE — Progress Notes (Signed)
Filed Vitals:   07/12/14 1027 07/12/14 1029 07/12/14 1041 07/12/14 1047  BP: 160/72 118/62 128/66 100/60  Pulse: 65 65 71 71  Resp:  16    Height: 5' 5.25" (1.657 m) 5' 5.25" (1.657 m)    Weight: 165 lb (74.844 kg) 165 lb (74.844 kg)    SpO2: 98% 98%

## 2014-07-12 NOTE — Addendum Note (Signed)
Addended by: Mena Goes on: 07/12/2014 04:57 PM   Modules accepted: Orders

## 2014-07-12 NOTE — Progress Notes (Signed)
[facility] [place] HISTORY AND PHYSICAL   MRN : [id number]  History of Present Illness:   William [name] is a 74 y.o. male patient of Dr. Scot Dock who is status post right external iliac artery stent placement in January 2011. He also has bilateral carotid artery stenosis. He returns today for follow up. Claudication pain in both lower legs after walking about 50 feet, worse than a year ago, pain in feet at night which is alleviated by walking.  He denies non healing wounds. He denies any history of stroke or TIA, denies steal symptoms in either arm, denies dizziness.  The patient reports that Dr. Brigitte Pulse thinks that pt had [facility] spotted fever the Summer of 2015 for about a month, took an antibx tx.   Pt Diabetic: No Pt smoker: former smoker, quit in the 1990's  Pt meds include: Statin :Yes ASA: Yes Other anticoagulants/antiplatelets: Plavix    Current Outpatient Prescriptions  Medication Sig Dispense Refill  . Ascorbic Acid (VITAMIN C) 1000 MG tablet Take 1,000 mg by mouth daily.    Marland Kitchen aspirin 81 MG tablet Take 81 mg by mouth daily.    Marland Kitchen atorvastatin (LIPITOR) 10 MG tablet Take 10 mg by mouth daily.    . fish oil-omega-3 fatty acids 1000 MG capsule Take 1,000 g by mouth 2 (two) times daily.    . folic acid (FOLVITE) 132 MCG tablet Take 400 mcg by mouth daily.    Marland Kitchen gabapentin (NEURONTIN) 300 MG capsule Take 300 mg by mouth 3 (three) times daily.    . Garlic 4401 MG TABS Take by mouth.    . irbesartan-hydrochlorothiazide (AVALIDE) 300-12.5 MG per tablet Take 1 tablet by mouth daily.    Marland Kitchen labetalol (NORMODYNE) 300 MG tablet Take 300 mg by mouth 2 (two) times daily.    . Multiple Vitamin (MULTIVITAMIN) tablet Take 1 tablet by mouth daily.    Marland Kitchen SPIRIVA HANDIHALER 18 MCG inhalation capsule   8  . thiamine (VITAMIN B-1) 100 MG tablet Take 100 mg by mouth daily.    . vitamin E 400 UNIT capsule Take 400 Units by mouth daily.    . cloNIDine (CATAPRES) 0.1 MG  tablet Take 0.1 mg by mouth 2 (two) times daily.    . clopidogrel (PLAVIX) 75 MG tablet Take 75 mg by mouth daily.    Marland Kitchen NIFEdipine (PROCARDIA XL/ADALAT-CC) 90 MG 24 hr tablet Take 90 mg by mouth daily.    Marland Kitchen olmesartan-hydrochlorothiazide (BENICAR HCT) 40-25 MG per tablet Take 1 tablet by mouth daily.    . simvastatin (ZOCOR) 20 MG tablet Take 20 mg by mouth every evening.     No current facility-administered medications for this visit.    Past Medical History  Diagnosis Date  . Hypertension   . Hyperlipidemia   . Peripheral arterial disease   . Diabetes mellitus     Type II  . Colon polyp   . Carotid artery occlusion   . Iliac artery aneurysm     Social History History  Substance Use Topics  . Smoking status: Former Smoker -- 2.00 packs/day    Types: Cigarettes    Quit date: 11/17/1991  . Smokeless tobacco: Never Used  . Alcohol Use: 0.0 oz/week    2-4 Standard drinks or equivalent per week    Family History Family History  Problem Relation Age of Onset  . Heart disease Father     Heart Disease before age 62  . Pulmonary embolism Father   .  Deep vein thrombosis Father   . Cancer Mother     Sarcoma    Surgical History Past Surgical History  Procedure Laterality Date  . Hiatal hernia repair    . Percutaneous translumnial angioplasty  04/23/2009    Catheterization of [place] external iliac artery with Left lower extremity runoff  . Bilateral renal  artery stenoses  04/23/2009    Not on File  Current Outpatient Prescriptions  Medication Sig Dispense Refill  . Ascorbic Acid (VITAMIN C) 1000 MG tablet Take 1,000 mg by mouth daily.    Marland Kitchen aspirin 81 MG tablet Take 81 mg by mouth daily.    Marland Kitchen atorvastatin (LIPITOR) 10 MG tablet Take 10 mg by mouth daily.    . fish oil-omega-3 fatty acids 1000 MG capsule Take 1,000 g by mouth 2 (two) times daily.    . folic acid (FOLVITE) 229 MCG tablet Take 400 mcg by mouth daily.    Marland Kitchen gabapentin (NEURONTIN) 300 MG capsule Take 300 mg by  mouth 3 (three) times daily.    . Garlic 7989 MG TABS Take by mouth.    . irbesartan-hydrochlorothiazide (AVALIDE) 300-12.5 MG per tablet Take 1 tablet by mouth daily.    Marland Kitchen labetalol (NORMODYNE) 300 MG tablet Take 300 mg by mouth 2 (two) times daily.    . Multiple Vitamin (MULTIVITAMIN) tablet Take 1 tablet by mouth daily.    Marland Kitchen SPIRIVA HANDIHALER 18 MCG inhalation capsule   8  . thiamine (VITAMIN B-1) 100 MG tablet Take 100 mg by mouth daily.    . vitamin E 400 UNIT capsule Take 400 Units by mouth daily.    . cloNIDine (CATAPRES) 0.1 MG tablet Take 0.1 mg by mouth 2 (two) times daily.    . clopidogrel (PLAVIX) 75 MG tablet Take 75 mg by mouth daily.    Marland Kitchen NIFEdipine (PROCARDIA XL/ADALAT-CC) 90 MG 24 hr tablet Take 90 mg by mouth daily.    Marland Kitchen olmesartan-hydrochlorothiazide (BENICAR HCT) 40-25 MG per tablet Take 1 tablet by mouth daily.    . simvastatin (ZOCOR) 20 MG tablet Take 20 mg by mouth every evening.     No current facility-administered medications for this visit.     REVIEW OF SYSTEMS: See HPI for pertinent positives and negatives.  Physical Examination Filed Vitals:   07/12/14 1027 07/12/14 1029  BP: 160/72 118/62  Pulse: 65 65  Resp:  16  Height: 5' 5.25" (1.657 m) 5' 5.25" (1.657 m)  Weight: 165 lb (74.844 kg) 165 lb (74.844 kg)  SpO2: 98% 98%   Body mass index is 27.26 kg/(m^2).  General: A&O x 3, WDWN. Gait: normal Eyes: PERRLA. Pulmonary: CTAB, without wheezes , rales or rhonchi. Cardiac: regular Rythm , without detected murmur.     Carotid Bruits Left Right   Negative Negative  Aorta is not palpable. Radial pulses: 2+ right, 1+ Left palpable   VASCULAR EXAM: Extremities without ischemic changes  without Gangrene; without open wounds.     LE Pulses LEFT RIGHT   FEMORAL   palpable  palpable    POPLITEAL not palpable  not palpable   POSTERIOR TIBIAL not palpable  not palpable    DORSALIS PEDIS  ANTERIOR TIBIAL not palpable  Not palpable    Abdomen: soft, NT, no palpable masses. Skin: no rashes, no ulcers. Musculoskeletal: no muscle wasting or atrophy. Neurologic: A&O X 3; Appropriate Affect ; SENSATION: normal; MOTOR FUNCTION: moving all extremities equally, motor strength 5/5 throughout. Speech is fluent/normal. CN 2-12 intact.  Non-Invasive Vascular Imaging (07/12/2014):    ILIAC ARTERY STENT EVALUATION    INDICATION: Peripheral vascular disease     PREVIOUS INTERVENTION(S): Right external iliac artery stent placement 04/23/2009.    DUPLEX EXAM:     RIGHT  LEFT   Peak Systolic Velocity (cm/s) Ratio (if abnormal) Waveform  Peak Systolic Velocity (cm/s) Ratio (if abnormal) Waveform  67  B  Aorta - Distal     160  B Artery - Proximal to Stent     130  B Stent - Proximal     69  B Stent - Mid     76  B Stent - Distal     94  B Artery - Distal to Stent     0.34 Today's ABI / TBI 0.38  0.47 Previous ABI / TBI (07/03/2013  ) 0.58    Waveform:    M - Monophasic       B - Biphasic       T - Triphasic  If Ankle Brachial Index (ABI) or Toe Brachial Index (TBI) performed, please see complete report     ADDITIONAL FINDINGS:     IMPRESSION: Patent right external iliac artery stent without evidence of restenosis. The right common iliac artery was not well visualized due to overlying bowel gas.     Compared to the previous exam:  No significant change in comparison to the last exam on 07/03/2013.     LOWER EXTREMITY ARTERIAL DUPLEX EVALUATION    INDICATION: Claudication.    PREVIOUS INTERVENTION(S): Right external iliac artery stent 04/23/2009.    DUPLEX EXAM:     RIGHT  LEFT   Peak Systolic Velocity (cm/s) Ratio (if abnormal) Waveform  Peak Systolic Velocity (cm/s) Ratio (if abnormal)  Waveform  51:457 8.96 B  Common Femoral Artery 173  B   41  M  Deep Femoral Artery 136  B  52  M Superficial Femoral Artery Proximal 27  M  146 4.29 M Superficial Femoral Artery Mid 0    40  M Superficial Femoral Artery Distal 0    27  M Popliteal Artery 0    NV   Posterior Tibial Artery Dist NV    NV   Anterior Tibial Artery Distal NV    NV   Peroneal Artery Distal NV    0.34 Today's ABI / TBI 0.38  0.47 Previous ABI / TBI (07/06/2013  ) 0.58    Waveform:    M - Monophasic       B - Biphasic       T - Triphasic  If Ankle Brachial Index (ABI) or Toe Brachial Index (TBI) performed, please see complete report     ADDITIONAL FINDINGS:     IMPRESSION: 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 Patent right superficial femoral artery with evidence of hemodynamically stenosis in the mid segment; diffuse plaque throughout. There is no color or Doppler flow noted in the left superficial femoral artery.    Compared to the previous exam:  No prior exam performed at this facility for comparison.     CEREBROVASCULAR DUPLEX EVALUATION    INDICATION: Carotid artery disease     PREVIOUS INTERVENTION(S):     DUPLEX EXAM:     RIGHT  LEFT  Peak Systolic Velocities (cm/s) End Diastolic Velocities (cm/s) Plaque LOCATION Peak Systolic Velocities (cm/s) End Diastolic Velocities (cm/s) Plaque  76 14  CCA PROXIMAL 96 23   100 19 HT CCA MID 101 27 HT  87 14  HT CCA DISTAL 108 23 HT  161 17 CP ECA 548 31 HT    CP ICA PROXIMAL 53 7 CP  227 81 CP ICA MID 159 61   234 73  ICA DISTAL 124 29     2.34 ICA / CCA Ratio (PSV) 1.57  Antegrade  Vertebral Flow Retrograde  518 Brachial Systolic Pressure (mmHg) 841  Triphasic  Brachial Artery Waveforms Monophasic     Plaque Morphology:  HM = Homogeneous, HT = Heterogeneous, CP = Calcific Plaque, SP = Smooth Plaque, IP = Irregular Plaque     ADDITIONAL FINDINGS: Brachial pressure difference noted.    IMPRESSION: Right internal carotid artery  velocities suggest a 60-79% stenosis; however, the proximal segment velocities could not be obtained due to a large calcified plaque with acoustic shadowing which prevented visualization. Left internal carotid artery velocities suggest a 40-59% stenosis.      Compared to the previous exam:  No prior exam performed at this facility for comparison.       ASSESSMENT William [name] is a 74 y.o. male  who is status post right external iliac artery stent placement in January 2011. He has moderate to severe claudication in both lower legs with walking, no rest pain, no tissue loss. 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 He also has bilateral carotid artery stenosis with no history of stroke or TIA.  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 Patent right superficial femoral artery with evidence of hemodynamically stenosis in the mid segment; diffuse plaque throughout. There is no color or Doppler flow noted in the left superficial femoral artery suggesting occlusion.  Today's right iliac artery stent Duplex suggests a patent right external iliac artery stent without evidence of restenosis. The right common iliac artery was not well visualized due to overlying bowel gas. No significant change in comparison to the last Duplex exam on 07/03/2013. Bilateral ABI's indicate worsening arterial occlusive disease compared to a year ago, bilateral critical limb ischemia, no tissue loss however, no rest pain.  Today's carotid Duplex suggests 60-79% right ICA stenosis and 40-59% left ICA stenosis. 12/03/2011 Carotid Duplex demonstrated 40-59% right ICA stenosis and <40% left ICA stenosis; progression of bilateral ICA stenoses.    PLAN:   Graduated walking program.  Based on today's exam and non-invasive vascular lab results, and after discussing with Dr. Scot Dock, the patient will follow up in 6 months with the following tests: aortoiliac  Duplex, ABI's, and carotid Duplex.  I discussed in depth with the patient the nature of atherosclerosis, and emphasized the importance of maximal medical management including strict control of blood pressure, blood glucose, and lipid levels, obtaining regular exercise, and cessation of smoking.  The patient is aware that without maximal medical management the underlying atherosclerotic disease process will progress, limiting the benefit of any interventions.  The patient was given information about stroke prevention and what symptoms should prompt the patient to seek immediate medical care.  The patient was given information about PAD including signs, symptoms, treatment, what symptoms should prompt the patient to seek immediate medical care, and risk reduction measures to take. Thank you for allowing [place] to participate in this patient's care.  Clemon Chambers, RN, MSN, FNP-C [facility] Office: [phone]  Clinic MD: Scot Dock  07/12/2014 10:44 AM

## 2014-07-12 NOTE — Patient Instructions (Signed)

## 2014-08-16 DIAGNOSIS — I10 Essential (primary) hypertension: Secondary | ICD-10-CM | POA: Diagnosis not present

## 2014-10-06 DIAGNOSIS — I10 Essential (primary) hypertension: Secondary | ICD-10-CM | POA: Diagnosis not present

## 2014-10-06 DIAGNOSIS — Z87891 Personal history of nicotine dependence: Secondary | ICD-10-CM | POA: Diagnosis not present

## 2014-10-06 DIAGNOSIS — I739 Peripheral vascular disease, unspecified: Secondary | ICD-10-CM | POA: Diagnosis not present

## 2014-10-06 DIAGNOSIS — Z6827 Body mass index (BMI) 27.0-27.9, adult: Secondary | ICD-10-CM | POA: Diagnosis not present

## 2014-10-06 DIAGNOSIS — E780 Pure hypercholesterolemia: Secondary | ICD-10-CM | POA: Diagnosis not present

## 2015-01-03 DIAGNOSIS — I10 Essential (primary) hypertension: Secondary | ICD-10-CM | POA: Diagnosis not present

## 2015-01-05 DIAGNOSIS — Z23 Encounter for immunization: Secondary | ICD-10-CM | POA: Diagnosis not present

## 2015-01-16 ENCOUNTER — Encounter: Payer: Self-pay | Admitting: Family

## 2015-01-16 DIAGNOSIS — I10 Essential (primary) hypertension: Secondary | ICD-10-CM | POA: Diagnosis not present

## 2015-01-16 DIAGNOSIS — E7800 Pure hypercholesterolemia, unspecified: Secondary | ICD-10-CM | POA: Diagnosis not present

## 2015-01-17 ENCOUNTER — Ambulatory Visit (INDEPENDENT_AMBULATORY_CARE_PROVIDER_SITE_OTHER): Payer: Medicare Other | Admitting: Family

## 2015-01-17 ENCOUNTER — Ambulatory Visit (HOSPITAL_COMMUNITY)
Admission: RE | Admit: 2015-01-17 | Discharge: 2015-01-17 | Disposition: A | Discharge status: Home / Self Care | Payer: Medicare Other | Source: Ambulatory Visit | Attending: Family | Admitting: Family

## 2015-01-17 ENCOUNTER — Encounter: Payer: Self-pay | Admitting: Family

## 2015-01-17 ENCOUNTER — Ambulatory Visit (INDEPENDENT_AMBULATORY_CARE_PROVIDER_SITE_OTHER)
Admission: RE | Admit: 2015-01-17 | Discharge: 2015-01-17 | Disposition: A | Discharge status: Home / Self Care | Payer: Medicare Other | Source: Ambulatory Visit | Attending: Family | Admitting: Family

## 2015-01-17 VITALS — BP 154/71 | HR 71 | Temp 98.3°F | Resp 18 | Ht 67.0 in | Wt 161.0 lb

## 2015-01-17 DIAGNOSIS — Z95828 Presence of other vascular implants and grafts: Secondary | ICD-10-CM | POA: Diagnosis not present

## 2015-01-17 DIAGNOSIS — I6523 Occlusion and stenosis of bilateral carotid arteries: Secondary | ICD-10-CM | POA: Insufficient documentation

## 2015-01-17 DIAGNOSIS — I739 Peripheral vascular disease, unspecified: Secondary | ICD-10-CM

## 2015-01-17 DIAGNOSIS — Z87891 Personal history of nicotine dependence: Secondary | ICD-10-CM

## 2015-01-17 DIAGNOSIS — I70213 Atherosclerosis of native arteries of extremities with intermittent claudication, bilateral legs: Secondary | ICD-10-CM

## 2015-01-17 DIAGNOSIS — I749 Embolism and thrombosis of unspecified artery: Secondary | ICD-10-CM

## 2015-01-17 DIAGNOSIS — I70219 Atherosclerosis of native arteries of extremities with intermittent claudication, unspecified extremity: Secondary | ICD-10-CM

## 2015-01-17 NOTE — Patient Instructions (Signed)
Stroke Prevention Some medical conditions and behaviors are associated with an increased chance of having a stroke. You may prevent a stroke by making healthy choices and managing medical conditions. HOW CAN I REDUCE MY RISK OF HAVING A STROKE?   Stay physically active. Get at least 30 minutes of activity on most or all days.  Do not smoke. It may also be helpful to avoid exposure to secondhand smoke.  Limit alcohol use. Moderate alcohol use is considered to be:  No more than 2 drinks per day for men.  No more than 1 drink per day for nonpregnant women.  Eat healthy foods. This involves:  Eating 5 or more servings of fruits and vegetables a day.  Making dietary changes that address high blood pressure (hypertension), high cholesterol, diabetes, or obesity.  Manage your cholesterol levels.  Making food choices that are high in fiber and low in saturated fat, trans fat, and cholesterol may control cholesterol levels.  Take any prescribed medicines to control cholesterol as directed by your health care provider.  Manage your diabetes.  Controlling your carbohydrate and sugar intake is recommended to manage diabetes.  Take any prescribed medicines to control diabetes as directed by your health care provider.  Control your hypertension.  Making food choices that are low in salt (sodium), saturated fat, trans fat, and cholesterol is recommended to manage hypertension.  Ask your health care provider if you need treatment to lower your blood pressure. Take any prescribed medicines to control hypertension as directed by your health care provider.  If you are 18-39 years of age, have your blood pressure checked every 3-5 years. If you are 40 years of age or older, have your blood pressure checked every year.  Maintain a healthy weight.  Reducing calorie intake and making food choices that are low in sodium, saturated fat, trans fat, and cholesterol are recommended to manage  weight.  Stop drug abuse.  Avoid taking birth control pills.  Talk to your health care provider about the risks of taking birth control pills if you are over 35 years old, smoke, get migraines, or have ever had a blood clot.  Get evaluated for sleep disorders (sleep apnea).  Talk to your health care provider about getting a sleep evaluation if you snore a lot or have excessive sleepiness.  Take medicines only as directed by your health care provider.  For some people, aspirin or blood thinners (anticoagulants) are helpful in reducing the risk of forming abnormal blood clots that can lead to stroke. If you have the irregular heart rhythm of atrial fibrillation, you should be on a blood thinner unless there is a good reason you cannot take them.  Understand all your medicine instructions.  Make sure that other conditions (such as anemia or atherosclerosis) are addressed. SEEK IMMEDIATE MEDICAL CARE IF:   You have sudden weakness or numbness of the face, arm, or leg, especially on one side of the body.  Your face or eyelid droops to one side.  You have sudden confusion.  You have trouble speaking (aphasia) or understanding.  You have sudden trouble seeing in one or both eyes.  You have sudden trouble walking.  You have dizziness.  You have a loss of balance or coordination.  You have a sudden, severe headache with no known cause.  You have new chest pain or an irregular heartbeat. Any of these symptoms may represent a serious problem that is an emergency. Do not wait to see if the symptoms will   go away. Get medical help at once. Call your local emergency services (911 in [place] Do not drive yourself to the hospital.   This information is not intended to replace advice given to you by your health care provider. Make sure you discuss any questions you have with your health care provider.   Document Released: 04/24/2004 Document Revised: 04/07/2014 Document Reviewed:  09/17/2012 Elsevier Interactive Patient Education 2016 [employer] Inc[employer]    Peripheral Vascular Disease Peripheral vascular disease (PVD) is a disease of the blood vessels that are not part of your heart and brain. A simple term for PVD is poor circulation. In most cases, PVD narrows the blood vessels that carry blood from your heart to the rest of your body. This can result in a decreased supply of blood to your arms, legs, and internal organs, like your stomach or kidneys. However, it most often affects a person's lower legs and feet. There are two types of PVD.  Organic PVD. This is the more common type. It is caused by damage to the structure of blood vessels.  Functional PVD. This is caused by conditions that make blood vessels contract and tighten (spasm). Without treatment, PVD tends to get worse over time. PVD can also lead to acute ischemic limb. This is when an arm or limb suddenly has trouble getting enough blood. This is a medical emergency. CAUSES Each type of PVD has many different causes. The most common cause of PVD is buildup of a fatty material (plaque) inside of your arteries (atherosclerosis). Small amounts of plaque can break off from the walls of the blood vessels and become lodged in a smaller artery. This blocks blood flow and can cause acute ischemic limb. Other common causes of PVD include:  Blood clots that form inside of blood vessels.  Injuries to blood vessels.  Diseases that cause inflammation of blood vessels or cause blood vessel spasms.  Health behaviors and health history that increase your risk of developing PVD. RISK FACTORS  You may have a greater risk of PVD if you:  Have a family history of PVD.  Have certain medical conditions, including:  High cholesterol.  Diabetes.  High blood pressure (hypertension).  Coronary heart disease.  Past problems with blood clots.  Past injury, such as burns or a broken bone. These may have damaged blood  vessels in your limbs.  Buerger disease. This is caused by inflamed blood vessels in your hands and feet.  Some forms of arthritis.  Rare birth defects that affect the arteries in your legs.  Use tobacco.  Do not get enough exercise.  Are obese.  Are age 50 or older. SIGNS AND SYMPTOMS  PVD may cause many different symptoms. Your symptoms depend on what part of your body is not getting enough blood. Some common signs and symptoms include:  Cramps in your lower legs. This may be a symptom of poor leg circulation (claudication).  Pain and weakness in your legs while you are physically active that goes away when you rest (intermittent claudication).  Leg pain when at rest.  Leg numbness, tingling, or weakness.  Coldness in a leg or foot, especially when compared with the other leg.  Skin or hair changes. These can include:  Hair loss.  Shiny skin.  Pale or bluish skin.  Thick toenails.  Inability to get or maintain an erection (erectile dysfunction). People with PVD are more prone to developing ulcers and sores on their toes, feet, or legs. These may take longer than   normal to heal. DIAGNOSIS Your health care provider may diagnose PVD from your signs and symptoms. The health care provider will also do a physical exam. You may have tests to find out what is causing your PVD and determine its severity. Tests may include:  Blood pressure recordings from your arms and legs and measurements of the strength of your pulses (pulse volume recordings).  Imaging studies using sound waves to take pictures of the blood flow through your blood vessels (Doppler ultrasound).  Injecting a dye into your blood vessels before having imaging studies using:  X-rays (angiogram or arteriogram).  Computer-generated X-rays (CT angiogram).  A powerful electromagnetic field and a computer (magnetic resonance angiogram or MRA). TREATMENT Treatment for PVD depends on the cause of your condition  and the severity of your symptoms. It also depends on your age. Underlying causes need to be treated and controlled. These include long-lasting (chronic) conditions, such as diabetes, high cholesterol, and high blood pressure. You may need to first try making lifestyle changes and taking medicines. Surgery may be needed if these do not work. Lifestyle changes may include:  Quitting smoking.  Exercising regularly.  Following a low-fat, low-cholesterol diet. Medicines may include:  Blood thinners to prevent blood clots.  Medicines to improve blood flow.  Medicines to improve your blood cholesterol levels. Surgical procedures may include:  A procedure that uses an inflated balloon to open a blocked artery and improve blood flow (angioplasty).  A procedure to put in a tube (stent) to keep a blocked artery open (stent implant).  Surgery to reroute blood flow around a blocked artery (peripheral bypass surgery).  Surgery to remove dead tissue from an infected wound on the affected limb.  Amputation. This is surgical removal of the affected limb. This may be necessary in cases of acute ischemic limb that are not improved through medical or surgical treatments. HOME CARE INSTRUCTIONS  Take medicines only as directed by your health care provider.  Do not use any tobacco products, including cigarettes, chewing tobacco, or electronic cigarettes. If you need help quitting, ask your health care provider.  Lose weight if you are overweight, and maintain a healthy weight as directed by your health care provider.  Eat a diet that is low in fat and cholesterol. If you need help, ask your health care provider.  Exercise regularly. Ask your health care provider to suggest some good activities for you.  Use compression stockings or other mechanical devices as directed by your health care provider.  Take good care of your feet.  Wear comfortable shoes that fit well.  Check your feet often for  any cuts or sores. SEEK MEDICAL CARE IF:  You have cramps in your legs while walking.  You have leg pain when you are at rest.  You have coldness in a leg or foot.  Your skin changes.  You have erectile dysfunction.  You have cuts or sores on your feet that are not healing. SEEK IMMEDIATE MEDICAL CARE IF:  Your arm or leg turns cold and blue.  Your arms or legs become red, warm, swollen, painful, or numb.  You have chest pain or trouble breathing.  You suddenly have weakness in your face, arm, or leg.  You become very confused or lose the ability to speak.  You suddenly have a very bad headache or lose your vision.   This information is not intended to replace advice given to you by your health care provider. Make sure you discuss any questions   you have with your health care provider.   Document Released: 04/24/2004 Document Revised: 04/07/2014 Document Reviewed: 08/25/2013 Elsevier Interactive Patient Education 2016 [employer]vier Inc.  

## 2015-01-17 NOTE — Progress Notes (Signed)
[facility] [place] HISTORY AND PHYSICAL   MRN : [id number]  History of Present Illness:   William [name] is a 74 y.o. male patient of Dr. Scot Dock who is status post right external iliac artery stent placement in January 2011.  He also has bilateral carotid artery stenosis.  He returns today for follow up.  Claudication pain in both lower legs after walking about 50 feet, worse than a year ago, pain in feet at night which is alleviated by walking.  He denies non healing wounds.  He denies any history of stroke or TIA, denies steal symptoms in either arm, denies dizziness.  The patient reports that Dr. Brigitte Pulse thinks that pt had [facility] spotted fever the Summer of 2015 for about a month, treated with antibiotics.   Pt Diabetic: No  Pt smoker: former smoker, quit in the 1990's   Pt meds include:  Statin :Yes  ASA: Yes  Other anticoagulants/antiplatelets: no     Current Outpatient Prescriptions  Medication Sig Dispense Refill  . Ascorbic Acid (VITAMIN C) 1000 MG tablet Take 1,000 mg by mouth daily.    Marland Kitchen aspirin 81 MG tablet Take 81 mg by mouth daily.    Marland Kitchen atorvastatin (LIPITOR) 10 MG tablet Take 10 mg by mouth daily.    . fish oil-omega-3 fatty acids 1000 MG capsule Take 1,000 g by mouth 2 (two) times daily.    . folic acid (FOLVITE) 818 MCG tablet Take 400 mcg by mouth daily.    Marland Kitchen gabapentin (NEURONTIN) 300 MG capsule Take 300 mg by mouth 3 (three) times daily.    . Garlic 2993 MG TABS Take by mouth.    . irbesartan-hydrochlorothiazide (AVALIDE) 300-12.5 MG per tablet Take 1 tablet by mouth daily.    Marland Kitchen labetalol (NORMODYNE) 300 MG tablet Take 300 mg by mouth 2 (two) times daily.    . Multiple Vitamin (MULTIVITAMIN) tablet Take 1 tablet by mouth daily.    Marland Kitchen SPIRIVA HANDIHALER 18 MCG inhalation capsule   8  . thiamine (VITAMIN B-1) 100 MG tablet Take 100 mg by mouth daily.    . vitamin E 400 UNIT capsule Take 400 Units by mouth daily.    . cloNIDine  (CATAPRES) 0.1 MG tablet Take 0.1 mg by mouth 2 (two) times daily.    . clopidogrel (PLAVIX) 75 MG tablet Take 75 mg by mouth daily.    Marland Kitchen NIFEdipine (PROCARDIA XL/ADALAT-CC) 90 MG 24 hr tablet Take 90 mg by mouth daily.    Marland Kitchen olmesartan-hydrochlorothiazide (BENICAR HCT) 40-25 MG per tablet Take 1 tablet by mouth daily.    . simvastatin (ZOCOR) 20 MG tablet Take 20 mg by mouth every evening.     No current facility-administered medications for this visit.    Past Medical History  Diagnosis Date  . Hypertension   . Hyperlipidemia   . Peripheral arterial disease ([place])   . Diabetes mellitus     Type II  . Colon polyp   . Carotid artery occlusion   . 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 Smoking status: Former Smoker -- 2.00 packs/day    Types: Cigarettes    Quit date: 11/17/1991  . Smokeless tobacco: Never Used  . Alcohol Use: 1.2 - 2.4 oz/week    2-4 Standard drinks or equivalent per week    Family History Family History  Problem Relation Age of Onset  . Heart disease Father  Heart Disease before age 77  . Pulmonary embolism Father   . Deep vein thrombosis Father   . Cancer Mother     Sarcoma    Surgical History Past Surgical History  Procedure Laterality Date  . Hiatal hernia repair    . Percutaneous translumnial angioplasty  04/23/2009    Catheterization of [place]st external iliac artery with Left lower extremity runoff  . Bilateral renal  artery stenoses  04/23/2009    Not on File  Current Outpatient Prescriptions  Medication Sig Dispense Refill  . Ascorbic Acid (VITAMIN C) 1000 MG tablet Take 1,000 mg by mouth daily.    Marland Kitchen aspirin 81 MG tablet Take 81 mg by mouth daily.    Marland Kitchen atorvastatin (LIPITOR) 10 MG tablet Take 10 mg by mouth daily.    . fish oil-omega-3 fatty acids 1000 MG capsule Take 1,000 g by mouth 2 (two) times daily.    . folic acid (FOLVITE) 341 MCG tablet Take 400 mcg by mouth daily.    Marland Kitchen  gabapentin (NEURONTIN) 300 MG capsule Take 300 mg by mouth 3 (three) times daily.    . Garlic 9379 MG TABS Take by mouth.    . irbesartan-hydrochlorothiazide (AVALIDE) 300-12.5 MG per tablet Take 1 tablet by mouth daily.    Marland Kitchen labetalol (NORMODYNE) 300 MG tablet Take 300 mg by mouth 2 (two) times daily.    . Multiple Vitamin (MULTIVITAMIN) tablet Take 1 tablet by mouth daily.    Marland Kitchen SPIRIVA HANDIHALER 18 MCG inhalation capsule   8  . thiamine (VITAMIN B-1) 100 MG tablet Take 100 mg by mouth daily.    . vitamin E 400 UNIT capsule Take 400 Units by mouth daily.    . cloNIDine (CATAPRES) 0.1 MG tablet Take 0.1 mg by mouth 2 (two) times daily.    . clopidogrel (PLAVIX) 75 MG tablet Take 75 mg by mouth daily.    Marland Kitchen NIFEdipine (PROCARDIA XL/ADALAT-CC) 90 MG 24 hr tablet Take 90 mg by mouth daily.    Marland Kitchen olmesartan-hydrochlorothiazide (BENICAR HCT) 40-25 MG per tablet Take 1 tablet by mouth daily.    . simvastatin (ZOCOR) 20 MG tablet Take 20 mg by mouth every evening.     No current facility-administered medications for this visit.     REVIEW OF SYSTEMS: See HPI for pertinent positives and negatives.  Physical Examination Filed Vitals:   01/17/15 1122 01/17/15 1124 01/17/15 1128  BP: 126/78 157/74 154/71  Pulse: 70 71 71  Temp: 98.3 F (36.8 C)    Resp: 18    Height: 5\' 7"  (1.702 m)    Weight: 161 lb (73.029 kg)    SpO2: 95%     Body mass index is 25.21 kg/(m^2).  General: A&O x 3, WDWN. Gait: normal Eyes: PERRLA. Pulmonary: CTAB, without wheezes , rales or rhonchi. Cardiac: regular rhythm, no detected murmur.     Carotid Bruits Left Right   Negative Negative  Aorta is not palpable. Radial pulses: 2+ right, 1+ Left palpable   VASCULAR EXAM: Extremities without ischemic changes  without Gangrene; without open wounds.      LE Pulses LEFT RIGHT   FEMORAL  palpable palpable    POPLITEAL not palpable  not palpable   POSTERIOR TIBIAL not palpable  not palpable    DORSALIS PEDIS  ANTERIOR TIBIAL not palpable  Not palpable    Abdomen: soft, NT, no palpable masses. Skin: no rashes, no ulcers. Musculoskeletal: no muscle wasting or atrophy. Neurologic: A&O X 3; Appropriate Affect ;  SENSATION: normal; MOTOR FUNCTION: moving all extremities equally, motor strength 5/5 throughout. Speech is fluent/normal. CN 2-12 intact.                Non-Invasive Vascular Imaging (01/17/2015):  Aortoiliac Duplex: Suboptimal exam due to bowel gas and known distal occlusions that affect waveform analysis. Bilateral iliac arteries are patent with disease present in the right proximal CIA, unable to categorize disease severity.  Right external iliac artery stent edges cannot be identified, flow is monophasic and dampened throughout the external iliac artery without significant stenosis. Comparison to previous exam is not appropriate due to technical limitations of today's exam.   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 No significant change compared to prior exam.     ASSESSMENT:  William [name] is a 74 y.o. male who is status post right external iliac artery stent placement in January 2011. He has moderate to severe claudication in both lower legs with walking, no rest pain, no tissue loss. 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 He also has  bilateral carotid artery stenosis with no history of stroke or TIA.  Today's carotid duplex suggests 60-79% right ICA stenosis, 40-59% left ICA stenosis, and retrogradel eft vertebral artery with monophasic subclavian artery consistent with significant proximal stenosis/occlusion. No significant change compared to prior exam six months prior.  Aortoiliac duplex today was suboptimal due to bowel gas and known distal occlusions that affect waveform analysis. Bilateral iliac arteries are patent with disease present in the right proximal CIA, unable to categorize disease severity.  Right external iliac artery stent edges cannot be identified, flow is monophasic and dampened throughout the external iliac artery without significant stenosis. Comparison to previous exam is not appropriate due to technical limitations of today's exam.   There has been a slight improvement in the left ABI from severe to moderate arterial occlusive disease, right ABI remains at a severe decrease in arterial perfusion. All monophasic waveforms.    Face to face time with patient was 25 minutes. Over 50% of this time was spent on counseling and coordination of care.   PLAN:   Based on today's exam and non-invasive vascular lab results, the patient will follow up in 6 months with the following tests: ABI's and carotid duplex. I discussed in depth with the patient the nature of atherosclerosis, and emphasized the importance of maximal medical management including strict control of blood pressure, blood glucose, and lipid levels, obtaining regular exercise, and continued cessation of smoking.  The patient is aware that without maximal medical management the underlying atherosclerotic disease process will progress, limiting the benefit of any interventions.  The patient was given information about stroke prevention and what symptoms should prompt the patient to seek immediate medical care.  The patient was given information about  PAD including signs, symptoms, treatment, what symptoms should prompt the patient to seek immediate medical care, and risk reduction measures to take. Thank you for allowing [place] to participate in this patient's care.  Clemon Chambers, RN, MSN, FNP-C [facility] Office: [phone]  Clinic MD: [facility]  01/17/2015 11:37 AM

## 2015-01-23 NOTE — Addendum Note (Signed)
Addended by: Dorthula Rue L on: 01/23/2015 10:20 AM   Modules accepted: Orders

## 2015-04-09 DIAGNOSIS — Z79899 Other long term (current) drug therapy: Secondary | ICD-10-CM | POA: Diagnosis not present

## 2015-04-09 DIAGNOSIS — Z1389 Encounter for screening for other disorder: Secondary | ICD-10-CM | POA: Diagnosis not present

## 2015-04-09 DIAGNOSIS — R5383 Other fatigue: Secondary | ICD-10-CM | POA: Diagnosis not present

## 2015-04-09 DIAGNOSIS — Z125 Encounter for screening for malignant neoplasm of prostate: Secondary | ICD-10-CM | POA: Diagnosis not present

## 2015-04-09 DIAGNOSIS — Z1211 Encounter for screening for malignant neoplasm of colon: Secondary | ICD-10-CM | POA: Diagnosis not present

## 2015-04-09 DIAGNOSIS — Z0000 Encounter for general adult medical examination without abnormal findings: Secondary | ICD-10-CM | POA: Diagnosis not present

## 2015-04-09 DIAGNOSIS — Z418 Encounter for other procedures for purposes other than remedying health state: Secondary | ICD-10-CM | POA: Diagnosis not present

## 2015-04-09 DIAGNOSIS — Z7189 Other specified counseling: Secondary | ICD-10-CM | POA: Diagnosis not present

## 2015-04-09 DIAGNOSIS — E7800 Pure hypercholesterolemia, unspecified: Secondary | ICD-10-CM | POA: Diagnosis not present

## 2015-05-11 DIAGNOSIS — Z87891 Personal history of nicotine dependence: Secondary | ICD-10-CM | POA: Diagnosis not present

## 2015-05-11 DIAGNOSIS — E7800 Pure hypercholesterolemia, unspecified: Secondary | ICD-10-CM | POA: Diagnosis not present

## 2015-05-11 DIAGNOSIS — J449 Chronic obstructive pulmonary disease, unspecified: Secondary | ICD-10-CM | POA: Diagnosis not present

## 2015-05-11 DIAGNOSIS — Z6830 Body mass index (BMI) 30.0-30.9, adult: Secondary | ICD-10-CM | POA: Diagnosis not present

## 2015-05-11 DIAGNOSIS — R079 Chest pain, unspecified: Secondary | ICD-10-CM | POA: Diagnosis not present

## 2015-05-11 DIAGNOSIS — K219 Gastro-esophageal reflux disease without esophagitis: Secondary | ICD-10-CM | POA: Diagnosis not present

## 2015-05-18 DIAGNOSIS — Z79899 Other long term (current) drug therapy: Secondary | ICD-10-CM | POA: Diagnosis not present

## 2015-05-18 DIAGNOSIS — I110 Hypertensive heart disease with heart failure: Secondary | ICD-10-CM | POA: Diagnosis not present

## 2015-05-18 DIAGNOSIS — R079 Chest pain, unspecified: Secondary | ICD-10-CM | POA: Diagnosis not present

## 2015-05-18 DIAGNOSIS — J449 Chronic obstructive pulmonary disease, unspecified: Secondary | ICD-10-CM | POA: Diagnosis not present

## 2015-05-18 DIAGNOSIS — E7800 Pure hypercholesterolemia, unspecified: Secondary | ICD-10-CM | POA: Diagnosis not present

## 2015-05-18 DIAGNOSIS — K219 Gastro-esophageal reflux disease without esophagitis: Secondary | ICD-10-CM | POA: Diagnosis not present

## 2015-05-18 DIAGNOSIS — I214 Non-ST elevation (NSTEMI) myocardial infarction: Secondary | ICD-10-CM | POA: Diagnosis not present

## 2015-05-18 DIAGNOSIS — I509 Heart failure, unspecified: Secondary | ICD-10-CM | POA: Diagnosis not present

## 2015-05-18 DIAGNOSIS — Z87891 Personal history of nicotine dependence: Secondary | ICD-10-CM | POA: Diagnosis not present

## 2015-05-19 ENCOUNTER — Encounter (HOSPITAL_COMMUNITY): Payer: Self-pay | Admitting: Neurology

## 2015-05-19 ENCOUNTER — Inpatient Hospital Stay (HOSPITAL_COMMUNITY): Payer: Medicare Other

## 2015-05-19 ENCOUNTER — Inpatient Hospital Stay (HOSPITAL_COMMUNITY)
Admission: AD | Admit: 2015-05-19 | Discharge: 2015-05-29 | DRG: 234 | Disposition: A | Discharge status: Home Health | Payer: Medicare Other | Source: Other Acute Inpatient Hospital | Attending: Cardiothoracic Surgery | Admitting: Cardiothoracic Surgery

## 2015-05-19 DIAGNOSIS — I252 Old myocardial infarction: Secondary | ICD-10-CM | POA: Diagnosis not present

## 2015-05-19 DIAGNOSIS — I2510 Atherosclerotic heart disease of native coronary artery without angina pectoris: Secondary | ICD-10-CM

## 2015-05-19 DIAGNOSIS — I739 Peripheral vascular disease, unspecified: Secondary | ICD-10-CM | POA: Diagnosis not present

## 2015-05-19 DIAGNOSIS — Z6826 Body mass index (BMI) 26.0-26.9, adult: Secondary | ICD-10-CM | POA: Diagnosis not present

## 2015-05-19 DIAGNOSIS — Z09 Encounter for follow-up examination after completed treatment for conditions other than malignant neoplasm: Secondary | ICD-10-CM

## 2015-05-19 DIAGNOSIS — I340 Nonrheumatic mitral (valve) insufficiency: Secondary | ICD-10-CM | POA: Diagnosis present

## 2015-05-19 DIAGNOSIS — I214 Non-ST elevation (NSTEMI) myocardial infarction: Secondary | ICD-10-CM | POA: Diagnosis not present

## 2015-05-19 DIAGNOSIS — Z8249 Family history of ischemic heart disease and other diseases of the circulatory system: Secondary | ICD-10-CM

## 2015-05-19 DIAGNOSIS — I351 Nonrheumatic aortic (valve) insufficiency: Secondary | ICD-10-CM | POA: Diagnosis present

## 2015-05-19 DIAGNOSIS — R079 Chest pain, unspecified: Secondary | ICD-10-CM | POA: Diagnosis not present

## 2015-05-19 DIAGNOSIS — E785 Hyperlipidemia, unspecified: Secondary | ICD-10-CM | POA: Diagnosis present

## 2015-05-19 DIAGNOSIS — Z79899 Other long term (current) drug therapy: Secondary | ICD-10-CM | POA: Diagnosis not present

## 2015-05-19 DIAGNOSIS — I071 Rheumatic tricuspid insufficiency: Secondary | ICD-10-CM | POA: Diagnosis present

## 2015-05-19 DIAGNOSIS — I10 Essential (primary) hypertension: Secondary | ICD-10-CM | POA: Diagnosis not present

## 2015-05-19 DIAGNOSIS — E1151 Type 2 diabetes mellitus with diabetic peripheral angiopathy without gangrene: Secondary | ICD-10-CM | POA: Diagnosis present

## 2015-05-19 DIAGNOSIS — I080 Rheumatic disorders of both mitral and aortic valves: Secondary | ICD-10-CM | POA: Diagnosis not present

## 2015-05-19 DIAGNOSIS — I6523 Occlusion and stenosis of bilateral carotid arteries: Secondary | ICD-10-CM | POA: Diagnosis present

## 2015-05-19 DIAGNOSIS — K5900 Constipation, unspecified: Secondary | ICD-10-CM | POA: Diagnosis present

## 2015-05-19 DIAGNOSIS — Z87891 Personal history of nicotine dependence: Secondary | ICD-10-CM | POA: Diagnosis not present

## 2015-05-19 DIAGNOSIS — I701 Atherosclerosis of renal artery: Secondary | ICD-10-CM | POA: Diagnosis present

## 2015-05-19 DIAGNOSIS — Z7982 Long term (current) use of aspirin: Secondary | ICD-10-CM

## 2015-05-19 DIAGNOSIS — J449 Chronic obstructive pulmonary disease, unspecified: Secondary | ICD-10-CM | POA: Diagnosis not present

## 2015-05-19 DIAGNOSIS — Z4682 Encounter for fitting and adjustment of non-vascular catheter: Secondary | ICD-10-CM | POA: Diagnosis not present

## 2015-05-19 DIAGNOSIS — J90 Pleural effusion, not elsewhere classified: Secondary | ICD-10-CM | POA: Diagnosis not present

## 2015-05-19 DIAGNOSIS — I708 Atherosclerosis of other arteries: Secondary | ICD-10-CM | POA: Diagnosis present

## 2015-05-19 DIAGNOSIS — J9811 Atelectasis: Secondary | ICD-10-CM | POA: Diagnosis not present

## 2015-05-19 DIAGNOSIS — Z951 Presence of aortocoronary bypass graft: Secondary | ICD-10-CM

## 2015-05-19 DIAGNOSIS — D62 Acute posthemorrhagic anemia: Secondary | ICD-10-CM | POA: Diagnosis not present

## 2015-05-19 DIAGNOSIS — Z9689 Presence of other specified functional implants: Secondary | ICD-10-CM

## 2015-05-19 DIAGNOSIS — E871 Hypo-osmolality and hyponatremia: Secondary | ICD-10-CM | POA: Diagnosis not present

## 2015-05-19 DIAGNOSIS — I25110 Atherosclerotic heart disease of native coronary artery with unstable angina pectoris: Secondary | ICD-10-CM | POA: Diagnosis not present

## 2015-05-19 DIAGNOSIS — I493 Ventricular premature depolarization: Secondary | ICD-10-CM | POA: Diagnosis present

## 2015-05-19 DIAGNOSIS — R918 Other nonspecific abnormal finding of lung field: Secondary | ICD-10-CM | POA: Diagnosis not present

## 2015-05-19 HISTORY — DX: Chronic obstructive pulmonary disease, unspecified: J44.9

## 2015-05-19 LAB — COMPREHENSIVE METABOLIC PANEL
ALT: 23 U/L (ref 17–63)
AST: 103 U/L — ABNORMAL HIGH (ref 15–41)
Albumin: 3.5 g/dL (ref 3.5–5.0)
Alkaline Phosphatase: 45 U/L (ref 38–126)
Anion gap: 13 (ref 5–15)
BUN: 15 mg/dL (ref 6–20)
CO2: 28 mmol/L (ref 22–32)
Calcium: 9.4 mg/dL (ref 8.9–10.3)
Chloride: 92 mmol/L — ABNORMAL LOW (ref 101–111)
Creatinine, Ser: 1.06 mg/dL (ref 0.61–1.24)
GFR calc Af Amer: >60 mL/min (ref >60)
GFR calc non Af Amer: >60 mL/min (ref >60)
Glucose, Bld: 99 mg/dL (ref 65–99)
Potassium: 3.9 mmol/L (ref 3.5–5.1)
Sodium: 133 mmol/L — ABNORMAL LOW (ref 135–145)
Total Bilirubin: 0.7 mg/dL (ref 0.3–1.2)
Total Protein: 5.9 g/dL — ABNORMAL LOW (ref 6.5–8.1)

## 2015-05-19 LAB — LIPID PANEL
Cholesterol: 137 mg/dL (ref 0–200)
HDL: 33 mg/dL — ABNORMAL LOW (ref >40)
LDL Cholesterol: 89 mg/dL (ref 0–99)
Total CHOL/HDL Ratio: 4.2 RATIO
Triglycerides: 74 mg/dL (ref <150)
VLDL: 15 mg/dL (ref 0–40)

## 2015-05-19 LAB — APTT: aPTT: 108 seconds — ABNORMAL HIGH (ref 24–37)

## 2015-05-19 LAB — CBC WITH DIFFERENTIAL/PLATELET
Basophils Absolute: 0 10*3/uL (ref 0.0–0.1)
Basophils Relative: 0 %
Eosinophils Absolute: 0.7 10*3/uL (ref 0.0–0.7)
Eosinophils Relative: 9 %
HCT: 35.6 % — ABNORMAL LOW (ref 39.0–52.0)
Hemoglobin: 12.1 g/dL — ABNORMAL LOW (ref 13.0–17.0)
Lymphocytes Relative: 20 %
Lymphs Abs: 1.6 10*3/uL (ref 0.7–4.0)
MCH: 30.2 pg (ref 26.0–34.0)
MCHC: 34 g/dL (ref 30.0–36.0)
MCV: 88.8 fL (ref 78.0–100.0)
Monocytes Absolute: 0.7 10*3/uL (ref 0.1–1.0)
Monocytes Relative: 8 %
Neutro Abs: 5 10*3/uL (ref 1.7–7.7)
Neutrophils Relative %: 63 %
Platelets: 307 10*3/uL (ref 150–400)
RBC: 4.01 MIL/uL — ABNORMAL LOW (ref 4.22–5.81)
RDW: 12.6 % (ref 11.5–15.5)
WBC: 8 10*3/uL (ref 4.0–10.5)

## 2015-05-19 LAB — TSH: TSH: 2.894 u[IU]/mL (ref 0.350–4.500)

## 2015-05-19 LAB — MAGNESIUM: Magnesium: 1.9 mg/dL (ref 1.7–2.4)

## 2015-05-19 LAB — TROPONIN I
Troponin I: 12.59 ng/mL (ref <0.031)
Troponin I: 24.01 ng/mL (ref <0.031)
Troponin I: 25.15 ng/mL (ref <0.031)

## 2015-05-19 LAB — HEPARIN LEVEL (UNFRACTIONATED)
Heparin Unfractionated: 0.28 IU/mL — ABNORMAL LOW (ref 0.30–0.70)
Heparin Unfractionated: 0.33 IU/mL (ref 0.30–0.70)

## 2015-05-19 LAB — BRAIN NATRIURETIC PEPTIDE: B Natriuretic Peptide: 406 pg/mL — ABNORMAL HIGH (ref 0.0–100.0)

## 2015-05-19 LAB — MRSA PCR SCREENING: MRSA by PCR: NEGATIVE

## 2015-05-19 LAB — PROTIME-INR
INR: 1.25 (ref 0.00–1.49)
Prothrombin Time: 15.9 seconds — ABNORMAL HIGH (ref 11.6–15.2)

## 2015-05-19 MED ORDER — LABETALOL HCL 200 MG PO TABS: 300.0000 mg | ORAL_TABLET | Freq: Two times a day (BID) | ORAL | Status: DC

## 2015-05-19 MED ORDER — TIOTROPIUM BROMIDE MONOHYDRATE 18 MCG IN CAPS
18.0000 ug | ORAL_CAPSULE | Freq: Every day | RESPIRATORY_TRACT | Status: DC
  Administered 2015-05-19 – 2015-05-21 (×3): 18 ug via RESPIRATORY_TRACT
  Filled 2015-05-19: qty 5

## 2015-05-19 MED ORDER — ASPIRIN EC 81 MG PO TBEC
81.0000 mg | DELAYED_RELEASE_TABLET | Freq: Every day | ORAL | Status: DC
  Administered 2015-05-19 – 2015-05-20 (×2): 81 mg via ORAL
  Filled 2015-05-19 (×2): qty 1

## 2015-05-19 MED ORDER — HEPARIN (PORCINE) IN NACL 100-0.45 UNIT/ML-% IJ SOLN
1200.0000 [IU]/h | INTRAMUSCULAR | Status: DC
  Administered 2015-05-19: 1200 [IU]/h via INTRAVENOUS
  Administered 2015-05-19: 1000 [IU]/h via INTRAVENOUS
  Administered 2015-05-20: 1200 [IU]/h via INTRAVENOUS
  Filled 2015-05-19 (×2): qty 250

## 2015-05-19 MED ORDER — ROSUVASTATIN CALCIUM 10 MG PO TABS
10.0000 mg | ORAL_TABLET | Freq: Every day | ORAL | Status: DC
  Administered 2015-05-19 – 2015-05-21 (×4): 10 mg via ORAL
  Filled 2015-05-19 (×5): qty 1

## 2015-05-19 MED ORDER — METOPROLOL TARTRATE 25 MG PO TABS
25.0000 mg | ORAL_TABLET | Freq: Four times a day (QID) | ORAL | Status: DC
  Administered 2015-05-19 – 2015-05-22 (×13): 25 mg via ORAL
  Filled 2015-05-19 (×14): qty 1

## 2015-05-19 MED ORDER — NITROGLYCERIN 0.4 MG SL SUBL: 0.4000 mg | SUBLINGUAL_TABLET | SUBLINGUAL | Status: DC | PRN

## 2015-05-19 MED ORDER — NITROGLYCERIN IN D5W 200-5 MCG/ML-% IV SOLN
0.0000 ug/min | INTRAVENOUS | Status: DC
  Administered 2015-05-19: 10 ug/min via INTRAVENOUS
  Administered 2015-05-20: 25 ug/min via INTRAVENOUS
  Filled 2015-05-19: qty 250

## 2015-05-19 MED ORDER — LABETALOL HCL 200 MG PO TABS
300.0000 mg | ORAL_TABLET | Freq: Two times a day (BID) | ORAL | Status: DC
  Filled 2015-05-19: qty 1

## 2015-05-19 MED ORDER — CETYLPYRIDINIUM CHLORIDE 0.05 % MT LIQD
7.0000 mL | Freq: Two times a day (BID) | OROMUCOSAL | Status: DC
Start: 2015-05-19 — End: 2015-05-22
  Administered 2015-05-19 – 2015-05-20 (×2): 7 mL via OROMUCOSAL

## 2015-05-19 MED ORDER — GABAPENTIN 300 MG PO CAPS
300.0000 mg | ORAL_CAPSULE | Freq: Three times a day (TID) | ORAL | Status: DC
  Administered 2015-05-19 – 2015-05-21 (×9): 300 mg via ORAL
  Filled 2015-05-19 (×9): qty 1

## 2015-05-19 MED ORDER — ACETAMINOPHEN 325 MG PO TABS
650.0000 mg | ORAL_TABLET | ORAL | Status: DC | PRN
  Administered 2015-05-20: 650 mg via ORAL
  Filled 2015-05-19: qty 2

## 2015-05-19 MED ORDER — ONDANSETRON HCL 4 MG/2ML IJ SOLN: 4.0000 mg | Freq: Four times a day (QID) | INTRAMUSCULAR | Status: DC | PRN

## 2015-05-19 NOTE — H&P (Addendum)
Cardiology History and Physical  HPI:  Patient is a 75 yo [place] M with h/o HTN, HLD, COPD, tobacco abuse, previous alcohol abuse, PVD s/p right external iliac stent, and carotid stenosis who presented to [facility] with chest pain.  Patient reports chest pain over the last month, however, his episode of chest pain was more severe and lasted longer today, prompting him to seek medical care.  This episode started at 4 pm on 2/17 and feels like a pressure across his chest, radiating to his left shoulder/arm and occasionally to the back of his neck.  He has SOB at baseline, which he attributes to his COPD, but denies increased SOB.  He denies N/V or diaphoresis.  He denies any history of coronary disease.  He took 1 aspirin on the way to the ED and the chest pain improved.  In the ED, he was started on a nitro gtt at 10 mcg and his symptoms completely resolved.  On arrival to [facility], he is chest pain free and has no complaints.  Work-up at OSH ED: Troponin T 0.35 (uln 0.01) Pro-BNP 1950 ABG: 7.48/40/72 (RA) CXR - no acute process Initial EKG at 2124 with inferolateral ST depressions with elevated in V1 and aVR; repeat EKG at 2205 and 2324 - ST depressions had resolved He was given morphine, Zofran, ASA 162 mg, metoprolol 5 mg IV x1, and nitro   Review of Systems:     Cardiac Review of Systems: {Y] = yes [ ]  = no  Chest Pain [ y   ]  Resting SOB [ n  ] Exertional SOB  [ y ]  Orthopnea [ n ]   Pedal Edema [ n  ]    Palpitations [n  ] Syncope  n[  ]   Presyncope [   ]  General Review of Systems: [Y] = yes [  ]=no Constitional: recent weight change [  ]; anorexia [n  ]; fatigue [ n ]; nausea [  ]; night sweats [  ]; fever [  ]; or chills [  ];                                                                     Eyes : blurred vision [  ]; diplopia [   ]; vision changes [  ];  Amaurosis fugax[  ]; Resp: cough [  ];  wheezing[  y];  hemoptysis[  ];  PND [  ];  GI:  gallstones[   ], vomiting[  ];  dysphagia[  ]; melena[  ];  hematochezia [  ]; heartburn[  ];   GU: kidney stones [  ]; hematuria[  ];   dysuria [  ];  nocturia[  ]; incontinence [  ];             Skin: rash, swelling[  ];, hair loss[  ];  peripheral edema[  ];  or itching[  ]; Musculosketetal: myalgias[  ];  joint swelling[  ];  joint erythema[  ];  joint pain[  ];  back pain[  ];  Heme/Lymph: bruising[  ];  bleeding[  ];  anemia[  ];  Neuro: TIA[  ];  headaches[  ];  stroke[  ];  vertigo[  ];  seizures[  ];   paresthesias[  ];  difficulty walking[  ];  Psych:depression[  ]; anxiety[  ];  Endocrine: diabetes[n  ];  thyroid dysfunction[  ];  Other:  Past Medical History  Diagnosis Date  . Hypertension   . Hyperlipidemia   . Peripheral arterial disease ([place])   . Diabetes mellitus     Type II  . Colon polyp   . Carotid artery occlusion   . 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 Marital Status: Married    Spouse Name: N/A  . Number of Children: N/A  . Years of Education: N/A   Occupational History  . Not on file.   Social History Main Topics  . Smoking status: Former Smoker -- 2.00 packs/day    Types: Cigarettes    Quit date: 11/17/1991  . Smokeless tobacco: Never Used  . Alcohol Use: 1.2 - 2.4 oz/week    2-4 Standard drinks or equivalent per week  . Drug Use: No  . Sexual Activity: Not on file   Other Topics Concern  . Not on file   Social History Narrative    Family History  Problem Relation Age of Onset  . Heart disease Father     Heart Disease before age 84  . Pulmonary embolism Father   . Deep vein thrombosis Father   . Cancer Mother     Sarcoma    PHYSICAL EXAM: Filed Vitals:   05/19/15  0100  BP: 134/108  Pulse: 100  Temp: 97.3 F (36.3 C)  Resp: 15   BP left arm 89/60; right arm 139/79  General:  Well appearing. No respiratory difficulty HEENT: normal Neck: supple. no JVD. Carotids 2+ bilat. Cor: PMI nondisplaced. Distant heart sounds.Tachycardic, regular rhythm. No rubs, gallops or murmurs. Lungs: clear, but decreased breath sounds at the bases Abdomen: soft, nontender, obese. No hepatosplenomegaly. No bruits or masses. Good bowel sounds. Extremities: no cyanosis, clubbing, rash, edema. Well-perfused.2+ right radial pulse, 1+ left radial pulse Neuro: alert & oriented x 3, cranial nerves grossly intact. moves all 4 extremities w/o difficulty. Affect pleasant.  ECG: 05/19/15 at 0121 - NSR, non-specific ST abnormality  CXR at OSH: No focal consolidation.  Linear lingular and left lung base densities, likely atelectasis/scarring.  Pneumonia less likely.  ASSESSMENT: 1. NSTEMI 2. Hypertension 3. Hyperlipidemia 4. PVD 5. Caroid stenosis 6. 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 NSTEMI. Chest pain, inferolateral ST depressions, and mildly elevated troponin at OSH. EKG normalized once patient was pain free.  Patient has many risk factors for coronary disease. TIMI score = 6. No chest pain free on low dose nitro gtt.  ASA 81 mg Continue heparin gtt (ACS nomogram) Continue nitroglycerin gtt, titrate to remain chest pain free Continue home Crestor Start metoprolol 25 mg q6hr Will defer Plavix loading, as patient is high risk for triple vessel or LM disease given risk factors and EKG. Trend troponins Check pro-BNP, lipid panel, HgbA1c, and TSH NPO for cath later today, will move toward urgent cath if chest pain recurs Perform TTE  2. Hypertension. Mildly elevated on admission.  Patient and family are unsure of home medications  Metoprolol and nitro gtt, as  above Confirm home medications  3. Hyperlipidemia.  Continue home  Crestor Check lipid panel  4. PVD s/p post right external iliac artery stent placement in January 201.1 Continue asa, statin.  5. Carotid stenosis. Continue asa, statin, antihypertensives.  6. BP and pulse differential - significantly lower on left arm compared to right, raising the concern for left subclavian stenosis.  Consider upper extremity duplex for further evaluation  FULL CODE Daughter and Son would be medical decision makers, if he was unable to speak for himself - he wants to meet with case management to establish an advanced directive

## 2015-05-19 NOTE — Progress Notes (Signed)
Pt. Troponin 12.59.  Cardiology made aware, pt. not having chest pain ans is asymptomatic.

## 2015-05-19 NOTE — Progress Notes (Signed)
*  PRELIMINARY RESULTS* Echocardiogram 2D Echocardiogram has been performed.  William [name] 05/19/2015, 3:07 PM

## 2015-05-19 NOTE — Progress Notes (Signed)
ANTICOAGULATION CONSULT NOTE - Initial Consult  Pharmacy Consult for Heparin Indication: chest pain/ACS  No Known Allergies  Patient Measurements: Height: 5\' 6"  (167.6 cm) Weight: 164 lb 12.8 oz (74.753 kg) IBW/kg (Calculated) : 63.8  Vital Signs: Temp: 97.3 F (36.3 C) (02/18 0100) Temp Source: Oral (02/18 0100) BP: 134/108 mmHg (02/18 0100) Pulse Rate: 100 (02/18 0100)  Labs (at [facility]): WBC  9.0 Hgb  13.1 Hct  37.9 Plt  340  SCr  0.94 No results for input(s): HGB, HCT, PLT, APTT, LABPROT, INR, HEPARINUNFRC, HEPRLOWMOCWT, CREATININE, CKTOTAL, CKMB, TROPONINI in the last 72 hours.  CrCl cannot be calculated (Patient has no serum creatinine result on file.).   Medical History: Past Medical History  Diagnosis Date  . Hypertension   . Hyperlipidemia   . Peripheral arterial disease ([place])   . Diabetes mellitus     Type II  . Colon polyp   . Carotid artery occlusion   . 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 male with chest pain/elevated cardiac markers for heparin.  Heparin 1000 units/hr started at [facility] at 10 pm  Goal of Therapy:  Heparin level 0.3-0.7 units/ml Monitor platelets by anticoagulation protocol: Yes   Plan:  Continue Heparin 1000 units/hr Follow-up am labs.   Deshanda Molitor, Bronson Curb 05/19/2015,1:58 AM

## 2015-05-19 NOTE — Progress Notes (Signed)
Patient's son, Nicole Kindred, given advance directives packet. [place], Ardeth Sportsman

## 2015-05-19 NOTE — Progress Notes (Signed)
Pt seen, examined, and admit note reviewed.  Chest pain free though significantly elevated troponin.  Will monitor closely but can transfer to stepdown once bed available. If he has additional chest pain, would have low threshold for urgent cath.  As long as he remains stable, will plan cath for Monday.  Thompson Grayer MD, [facility] 05/19/2015 6:53 AM

## 2015-05-19 NOTE — Progress Notes (Signed)
[place] for Heparin Indication: chest pain/ACS  Allergies  Allergen Reactions  . Lipitor [Atorvastatin] Other (See Comments)    Leg pain  . Zocor [Simvastatin] Other (See Comments)    Leg pain    Patient Measurements: Height: 5\' 6"  (167.6 cm) Weight: 164 lb 12.8 oz (74.753 kg) IBW/kg (Calculated) : 63.8  Vital Signs: Temp: 97.6 F (36.4 C) (02/18 1548) Temp Source: Oral (02/18 1548) BP: 94/46 mmHg (02/18 1600) Pulse Rate: 64 (02/18 1600)   Recent Labs  05/19/15 0224 05/19/15 0657 05/19/15 1440  HGB 12.1*  --   --   HCT 35.6*  --   --   PLT 307  --   --   APTT 108*  --   --   LABPROT 15.9*  --   --   INR 1.25  --   --   HEPARINUNFRC  --  0.28* 0.33  CREATININE 1.06  --   --   TROPONINI 12.59* 24.01* 25.15*    Estimated Creatinine Clearance: 56 mL/min (by C-G formula based on Cr of 1.06).  Assessment: 75 y.o. male presented w/ chest pain, found to have NSTEMI w/ trop elevated to 12.59. Planning for cath on Monday. Heparin level is now at goal on 1200 units/hr.   Goal of Therapy:  Heparin level 0.3-0.7 units/ml Monitor platelets by anticoagulation protocol: Yes   Plan:  -no heparin changes needed -Daily heparin level and CBC  Hildred Laser, Pharm D 05/19/2015 5:31 PM

## 2015-05-19 NOTE — Progress Notes (Signed)
ANTICOAGULATION CONSULT NOTE - Initial Consult  Pharmacy Consult for Heparin Indication: chest pain/ACS  No Known Allergies  Patient Measurements: Height: 5\' 6"  (167.6 cm) Weight: 164 lb 12.8 oz (74.753 kg) IBW/kg (Calculated) : 63.8  Vital Signs: Temp: 96.8 F (36 C) (02/18 0800) Temp Source: Axillary (02/18 0800) BP: 125/56 mmHg (02/18 0800) Pulse Rate: 66 (02/18 0800)  Labs (at [facility]): WBC  9.0 Hgb  13.1 Hct  37.9 Plt  340  SCr  0.94  Recent Labs  05/19/15 0224 05/19/15 0657  HGB 12.1*  --   HCT 35.6*  --   PLT 307  --   APTT 108*  --   LABPROT 15.9*  --   INR 1.25  --   HEPARINUNFRC  --  0.28*  CREATININE 1.06  --   TROPONINI 12.59*  --     Estimated Creatinine Clearance: 56 mL/min (by C-G formula based on Cr of 1.06).  Assessment: 75 y.o. male presented w/ chest pain, found to have NSTEMI w/ trop elevated to 12.59. Planning for cath on Monday. Heparin level subtherapeutic on 1000 units/hr. Hgb slightly low, Plts wnl. No bleeding noted.  Goal of Therapy:  Heparin level 0.3-0.7 units/ml Monitor platelets by anticoagulation protocol: Yes   Plan:  Increase heparin to 1200 units/hr F/u heparin level Monitor for s/sx bleeding  Joya San, PharmD Clinical Pharmacy Resident Pager # [phone] 05/19/2015 8:58 AM

## 2015-05-20 DIAGNOSIS — I10 Essential (primary) hypertension: Secondary | ICD-10-CM

## 2015-05-20 DIAGNOSIS — I214 Non-ST elevation (NSTEMI) myocardial infarction: Principal | ICD-10-CM

## 2015-05-20 DIAGNOSIS — E785 Hyperlipidemia, unspecified: Secondary | ICD-10-CM

## 2015-05-20 LAB — BASIC METABOLIC PANEL
Anion gap: 9 (ref 5–15)
BUN: 16 mg/dL (ref 6–20)
CO2: 27 mmol/L (ref 22–32)
Calcium: 9 mg/dL (ref 8.9–10.3)
Chloride: 98 mmol/L — ABNORMAL LOW (ref 101–111)
Creatinine, Ser: 1.05 mg/dL (ref 0.61–1.24)
GFR calc Af Amer: >60 mL/min (ref >60)
GFR calc non Af Amer: >60 mL/min (ref >60)
Glucose, Bld: 98 mg/dL (ref 65–99)
Potassium: 4 mmol/L (ref 3.5–5.1)
Sodium: 134 mmol/L — ABNORMAL LOW (ref 135–145)

## 2015-05-20 LAB — CBC
HCT: 34.7 % — ABNORMAL LOW (ref 39.0–52.0)
Hemoglobin: 11.6 g/dL — ABNORMAL LOW (ref 13.0–17.0)
MCH: 30.3 pg (ref 26.0–34.0)
MCHC: 33.4 g/dL (ref 30.0–36.0)
MCV: 90.6 fL (ref 78.0–100.0)
Platelets: 317 10*3/uL (ref 150–400)
RBC: 3.83 MIL/uL — ABNORMAL LOW (ref 4.22–5.81)
RDW: 12.9 % (ref 11.5–15.5)
WBC: 10 10*3/uL (ref 4.0–10.5)

## 2015-05-20 LAB — MAGNESIUM: Magnesium: 1.8 mg/dL (ref 1.7–2.4)

## 2015-05-20 LAB — HEPARIN LEVEL (UNFRACTIONATED): Heparin Unfractionated: 0.65 IU/mL (ref 0.30–0.70)

## 2015-05-20 MED ORDER — SODIUM CHLORIDE 0.9 % IV SOLN: 250.0000 mL | INTRAVENOUS | Status: DC | PRN

## 2015-05-20 MED ORDER — SODIUM CHLORIDE 0.9% FLUSH
3.0000 mL | Freq: Two times a day (BID) | INTRAVENOUS | Status: DC
  Administered 2015-05-20 – 2015-05-21 (×2): 3 mL via INTRAVENOUS

## 2015-05-20 MED ORDER — ASPIRIN 81 MG PO CHEW
81.0000 mg | CHEWABLE_TABLET | ORAL | Status: AC
  Administered 2015-05-21: 81 mg via ORAL
  Filled 2015-05-20: qty 1

## 2015-05-20 MED ORDER — SODIUM CHLORIDE 0.9 % WEIGHT BASED INFUSION
1.0000 mL/kg/h | INTRAVENOUS | Status: DC
  Administered 2015-05-21: 150 mL via INTRAVENOUS

## 2015-05-20 MED ORDER — SODIUM CHLORIDE 0.9% FLUSH: 3.0000 mL | INTRAVENOUS | Status: DC | PRN

## 2015-05-20 MED ORDER — SODIUM CHLORIDE 0.9 % WEIGHT BASED INFUSION
3.0000 mL/kg/h | INTRAVENOUS | Status: DC
  Administered 2015-05-21: 3 mL/kg/h via INTRAVENOUS

## 2015-05-20 NOTE — Progress Notes (Signed)
SUBJECTIVE: The patient is doing well today.  At this time, he denies chest pain, shortness of breath, or any new concerns.  Marland Kitchen antiseptic oral rinse  7 mL Mouth Rinse BID  . aspirin EC  81 mg Oral Daily  . gabapentin  300 mg Oral TID  . metoprolol tartrate  25 mg Oral 4 times per day  . rosuvastatin  10 mg Oral q1800  . tiotropium  18 mcg Inhalation Daily   . heparin 1,200 Units/hr (05/20/15 0700)  . nitroGLYCERIN 25 mcg/min (05/20/15 0700)    OBJECTIVE: Physical Exam: Filed Vitals:   05/20/15 0425 05/20/15 0426 05/20/15 0800 05/20/15 0857  BP:  134/68 122/78   Pulse:  70 64   Temp:  97.5 F (36.4 C) 97.5 F (36.4 C)   TempSrc:  Oral Oral   Resp:  17 12   Height:      Weight: 162 lb 0.6 oz (73.5 kg)     SpO2:  93% 96% 96%    Intake/Output Summary (Last 24 hours) at 05/20/15 0914 Last data filed at 05/20/15 0800  Gross per 24 hour  Intake 1101.96 ml  Output    900 ml  Net 201.96 ml    Telemetry reveals sinus rhythm  GEN- The patient is well appearing, alert and oriented x 3 today.   Head- normocephalic, atraumatic Eyes-  Sclera clear, conjunctiva pink Ears- hearing intact Oropharynx- clear Neck- supple,   Lungs- Clear to ausculation bilaterally, normal work of breathing Heart- Regular rate and rhythm, no murmurs, rubs or gallops, PMI not laterally displaced GI- soft, NT, ND, + BS Extremities- no clubbing, cyanosis, or edema Skin- no rash or lesion Psych- euthymic mood, full affect Neuro- strength and sensation are intact  LABS: Basic Metabolic Panel:  Recent Labs  05/19/15 0224 05/20/15 0231  NA 133* 134*  K 3.9 4.0  CL 92* 98*  CO2 28 27  GLUCOSE 99 98  BUN 15 16  CREATININE 1.06 1.05  CALCIUM 9.4 9.0  MG 1.9 1.8   Liver Function Tests:  Recent Labs  05/19/15 0224  AST 103*  ALT 23  ALKPHOS 45  BILITOT 0.7  PROT 5.9*  ALBUMIN 3.5   No results for input(s): LIPASE, AMYLASE in the last 72 hours. CBC:  Recent Labs  05/19/15 0224  05/20/15 0231  WBC 8.0 10.0  NEUTROABS 5.0  --   HGB 12.1* 11.6*  HCT 35.6* 34.7*  MCV 88.8 90.6  PLT 307 317   Cardiac Enzymes:  Recent Labs  05/19/15 0224 05/19/15 0657 05/19/15 1440  TROPONINI 12.59* 24.01* 25.15*   Fasting Lipid Panel:  Recent Labs  05/19/15 0224  CHOL 137  HDL 33*  LDLCALC 89  TRIG 74  CHOLHDL 4.2   Thyroid Function Tests:  Recent Labs  05/19/15 0224  TSH 2.894    ASSESSMENT AND PLAN:  1. NSTEMI. Chest pain, inferolateral ST depressions, and robust elevated in troponin. Now chest pain free on low dose nitro gtt.  EKG has improved. ASA 81 mg Continue heparin gtt (ACS nomogram) Continue nitroglycerin gtt, titrate to remain chest pain free Continue home Crestor Continue metoprolol 25 mg q6hr Will defer Plavix loading, as patient is high risk for triple vessel or LM disease given risk factors and EKG. Perform TTE  I would recommend left heart catheterization with possible PCI.  Discussed the cath with the patient. The patient understands that risks included but are not limited to stroke (1 in 1000), death (1 in 62),  kidney failure [usually temporary] (1 in 500), bleeding (1 in 200), allergic reaction [possibly serious] (1 in 200). The patient understands and agrees to proceed.  Orders placed and patient is on cath board.   2. Hypertension. Stable No change required today   3. Hyperlipidemia. Continue home Crestor  4. PVD s/p post right external iliac artery stent placement in January 201.1 Continue asa, statin.  5. Carotid stenosis. Continue asa, statin, antihypertensives.   6. BP and pulse differential - significantly lower on left arm compared to right, raising the concern for left subclavian stenosis.    Thompson Grayer, MD 05/20/2015 9:14 AM

## 2015-05-20 NOTE — Progress Notes (Signed)
Appeared to have EKG changes on monitor. Patient denies any chest pain. Obtained a 12 Lead EKG to compare to AM EKG. No signes of changes from AM. Will continue to monitor patient. Gildardo Cranker, RN

## 2015-05-20 NOTE — Progress Notes (Signed)
[facility] [name] for Heparin Indication: chest pain/ACS  Allergies  Allergen Reactions  . Lipitor [Atorvastatin] Other (See Comments)    Leg pain  . Zocor [Simvastatin] Other (See Comments)    Leg pain    Patient Measurements: Height: 5\' 6"  (167.6 cm) Weight: 162 lb 0.6 oz (73.5 kg) IBW/kg (Calculated) : 63.8  Vital Signs: Temp: 97.5 F (36.4 C) (02/19 0800) Temp Source: Oral (02/19 0800) BP: 122/78 mmHg (02/19 0800) Pulse Rate: 64 (02/19 0800)   Recent Labs  05/19/15 0224 05/19/15 0657 05/19/15 1440 05/20/15 0231  HGB 12.1*  --   --  11.6*  HCT 35.6*  --   --  34.7*  PLT 307  --   --  317  APTT 108*  --   --   --   LABPROT 15.9*  --   --   --   INR 1.25  --   --   --   HEPARINUNFRC  --  0.28* 0.33 0.65  CREATININE 1.06  --   --  1.05  TROPONINI 12.59* 24.01* 25.15*  --     Estimated Creatinine Clearance: 56.5 mL/min (by C-G formula based on Cr of 1.05).  Assessment: 75 y.o. male presented w/ chest pain, found to have NSTEMI w/ trop elevated to 25.15. Planning for cath on Monday. Heparin level is now at goal on 1200 units/hr.   Goal of Therapy:  Heparin level 0.3-0.7 units/ml Monitor platelets by anticoagulation protocol: Yes   Plan:  - Continue heparin 1200 units/hr -Daily heparin level and CBC  Joya San, PharmD Clinical Pharmacy Resident Pager # [phone] 05/20/2015 9:06 AM

## 2015-05-21 ENCOUNTER — Encounter (HOSPITAL_COMMUNITY)
Admission: AD | Disposition: A | Discharge status: Home Health | Payer: Self-pay | Source: Other Acute Inpatient Hospital | Attending: Cardiothoracic Surgery

## 2015-05-21 ENCOUNTER — Inpatient Hospital Stay (HOSPITAL_COMMUNITY): Payer: Medicare Other

## 2015-05-21 ENCOUNTER — Encounter (HOSPITAL_COMMUNITY): Payer: Self-pay | Admitting: Certified Registered Nurse Anesthetist

## 2015-05-21 ENCOUNTER — Other Ambulatory Visit: Payer: Self-pay | Admitting: *Deleted

## 2015-05-21 DIAGNOSIS — I25110 Atherosclerotic heart disease of native coronary artery with unstable angina pectoris: Secondary | ICD-10-CM

## 2015-05-21 DIAGNOSIS — I2510 Atherosclerotic heart disease of native coronary artery without angina pectoris: Secondary | ICD-10-CM

## 2015-05-21 HISTORY — PX: CARDIAC CATHETERIZATION: SHX172

## 2015-05-21 LAB — SURGICAL PCR SCREEN
MRSA, PCR: NEGATIVE
Staphylococcus aureus: NEGATIVE

## 2015-05-21 LAB — SPIROMETRY WITH GRAPH
FEF 25-75 Post: 0.97 L/sec
FEF 25-75 Pre: 0.97 L/sec
FEF2575-%Change-Post: 0 %
FEF2575-%Pred-Post: 50 %
FEF2575-%Pred-Pre: 50 %
FEV1-%Change-Post: 0 %
FEV1-%Pred-Post: 74 %
FEV1-%Pred-Pre: 73 %
FEV1-Post: 1.95 L
FEV1-Pre: 1.93 L
FEV1FVC-%Change-Post: 1 %
FEV1FVC-%Pred-Pre: 88 %
FEV6-%Change-Post: 0 %
FEV6-%Pred-Post: 85 %
FEV6-%Pred-Pre: 86 %
FEV6-Post: 2.9 L
FEV6-Pre: 2.92 L
FEV6FVC-%Change-Post: 0 %
FEV6FVC-%Pred-Post: 105 %
FEV6FVC-%Pred-Pre: 105 %
FVC-%Change-Post: 0 %
FVC-%Pred-Post: 81 %
FVC-%Pred-Pre: 82 %
FVC-Post: 2.96 L
FVC-Pre: 2.98 L
Post FEV1/FVC ratio: 66 %
Post FEV6/FVC ratio: 98 %
Pre FEV1/FVC ratio: 65 %
Pre FEV6/FVC Ratio: 98 %

## 2015-05-21 LAB — URINALYSIS, ROUTINE W REFLEX MICROSCOPIC
Bilirubin Urine: NEGATIVE
Glucose, UA: NEGATIVE mg/dL
Hgb urine dipstick: NEGATIVE
Ketones, ur: NEGATIVE mg/dL
Nitrite: NEGATIVE
Protein, ur: NEGATIVE mg/dL
Specific Gravity, Urine: 1.013 (ref 1.005–1.030)
pH: 7.5 (ref 5.0–8.0)

## 2015-05-21 LAB — CBC
HCT: 36.9 % — ABNORMAL LOW (ref 39.0–52.0)
Hemoglobin: 12.3 g/dL — ABNORMAL LOW (ref 13.0–17.0)
MCH: 30.3 pg (ref 26.0–34.0)
MCHC: 33.3 g/dL (ref 30.0–36.0)
MCV: 90.9 fL (ref 78.0–100.0)
Platelets: 315 10*3/uL (ref 150–400)
RBC: 4.06 MIL/uL — ABNORMAL LOW (ref 4.22–5.81)
RDW: 12.9 % (ref 11.5–15.5)
WBC: 9.5 10*3/uL (ref 4.0–10.5)

## 2015-05-21 LAB — URINE MICROSCOPIC-ADD ON

## 2015-05-21 LAB — BASIC METABOLIC PANEL
Anion gap: 10 (ref 5–15)
BUN: 18 mg/dL (ref 6–20)
CO2: 26 mmol/L (ref 22–32)
Calcium: 9.2 mg/dL (ref 8.9–10.3)
Chloride: 98 mmol/L — ABNORMAL LOW (ref 101–111)
Creatinine, Ser: 1.13 mg/dL (ref 0.61–1.24)
GFR calc Af Amer: >60 mL/min (ref >60)
GFR calc non Af Amer: >60 mL/min (ref >60)
Glucose, Bld: 100 mg/dL — ABNORMAL HIGH (ref 65–99)
Potassium: 4.4 mmol/L (ref 3.5–5.1)
Sodium: 134 mmol/L — ABNORMAL LOW (ref 135–145)

## 2015-05-21 LAB — TYPE AND SCREEN
ABO/RH(D): A POS
Antibody Screen: NEGATIVE

## 2015-05-21 LAB — BLOOD GAS, ARTERIAL
Acid-base deficit: 1.1 mmol/L (ref 0.0–2.0)
Bicarbonate: 22.4 mEq/L (ref 20.0–24.0)
Drawn by: 23588
FIO2: 0.21
O2 Saturation: 93.1 %
Patient temperature: 98.6
TCO2: 23.4 mmol/L (ref 0–100)
pCO2 arterial: 32.6 mmHg — ABNORMAL LOW (ref 35.0–45.0)
pH, Arterial: 7.451 — ABNORMAL HIGH (ref 7.350–7.450)
pO2, Arterial: 66.3 mmHg — ABNORMAL LOW (ref 80.0–100.0)

## 2015-05-21 LAB — HEMOGLOBIN A1C
Hgb A1c MFr Bld: 5.9 % — ABNORMAL HIGH (ref 4.8–5.6)
Mean Plasma Glucose: 123 mg/dL

## 2015-05-21 LAB — MAGNESIUM: Magnesium: 1.9 mg/dL (ref 1.7–2.4)

## 2015-05-21 LAB — ABO/RH: ABO/RH(D): A POS

## 2015-05-21 LAB — HEPARIN LEVEL (UNFRACTIONATED): Heparin Unfractionated: 0.68 IU/mL (ref 0.30–0.70)

## 2015-05-21 SURGERY — LEFT HEART CATH AND CORONARY ANGIOGRAPHY
Anesthesia: LOCAL

## 2015-05-21 MED ORDER — ACETAMINOPHEN 325 MG PO TABS: 650.0000 mg | ORAL_TABLET | ORAL | Status: DC | PRN

## 2015-05-21 MED ORDER — LIDOCAINE HCL (PF) 1 % IJ SOLN
INTRAMUSCULAR | Status: DC | PRN
  Administered 2015-05-21: 2 mL via SUBCUTANEOUS

## 2015-05-21 MED ORDER — HEPARIN SODIUM (PORCINE) 1000 UNIT/ML IJ SOLN
INTRAMUSCULAR | Status: DC | PRN
  Administered 2015-05-21: 3500 [IU] via INTRAVENOUS

## 2015-05-21 MED ORDER — SODIUM CHLORIDE 0.9 % IV SOLN
INTRAVENOUS | Status: DC
  Filled 2015-05-21: qty 30

## 2015-05-21 MED ORDER — PLASMA-LYTE 148 IV SOLN
INTRAVENOUS | Status: AC
  Administered 2015-05-22: 500 mL
  Filled 2015-05-21: qty 2.5

## 2015-05-21 MED ORDER — SODIUM CHLORIDE 0.9 % IV SOLN: INTRAVENOUS | Status: AC

## 2015-05-21 MED ORDER — VANCOMYCIN HCL 10 G IV SOLR
1250.0000 mg | INTRAVENOUS | Status: AC
  Administered 2015-05-22: 1000 mg via INTRAVENOUS
  Filled 2015-05-21: qty 1250

## 2015-05-21 MED ORDER — CHLORHEXIDINE GLUCONATE CLOTH 2 % EX PADS
6.0000 | MEDICATED_PAD | Freq: Once | CUTANEOUS | Status: AC
  Administered 2015-05-22: 6 via TOPICAL

## 2015-05-21 MED ORDER — MAGNESIUM SULFATE 50 % IJ SOLN
40.0000 meq | INTRAMUSCULAR | Status: DC
  Filled 2015-05-21: qty 10

## 2015-05-21 MED ORDER — HEPARIN (PORCINE) IN NACL 2-0.9 UNIT/ML-% IJ SOLN
INTRAMUSCULAR | Status: DC | PRN
  Administered 2015-05-21: 1500 mL via INTRA_ARTERIAL

## 2015-05-21 MED ORDER — HEPARIN (PORCINE) IN NACL 100-0.45 UNIT/ML-% IJ SOLN
1100.0000 [IU]/h | INTRAMUSCULAR | Status: DC
  Administered 2015-05-21: 1100 [IU]/h via INTRAVENOUS
  Filled 2015-05-21: qty 250

## 2015-05-21 MED ORDER — SODIUM CHLORIDE 0.9 % IV SOLN: 250.0000 mL | INTRAVENOUS | Status: DC | PRN

## 2015-05-21 MED ORDER — HEPARIN SODIUM (PORCINE) 1000 UNIT/ML IJ SOLN
INTRAMUSCULAR | Status: AC
  Filled 2015-05-21: qty 1

## 2015-05-21 MED ORDER — ONDANSETRON HCL 4 MG/2ML IJ SOLN: 4.0000 mg | Freq: Four times a day (QID) | INTRAMUSCULAR | Status: DC | PRN

## 2015-05-21 MED ORDER — SODIUM CHLORIDE 0.9% FLUSH: 3.0000 mL | INTRAVENOUS | Status: DC | PRN

## 2015-05-21 MED ORDER — METOPROLOL TARTRATE 12.5 MG HALF TABLET
12.5000 mg | ORAL_TABLET | Freq: Once | ORAL | Status: AC
  Administered 2015-05-22: 12.5 mg via ORAL
  Filled 2015-05-21: qty 1

## 2015-05-21 MED ORDER — DEXMEDETOMIDINE HCL IN NACL 400 MCG/100ML IV SOLN
0.1000 ug/kg/h | INTRAVENOUS | Status: AC
  Administered 2015-05-22: 11:00:00 via INTRAVENOUS
  Administered 2015-05-22: .3 ug/kg/h via INTRAVENOUS
  Filled 2015-05-21: qty 100

## 2015-05-21 MED ORDER — CHLORHEXIDINE GLUCONATE 0.12 % MT SOLN
15.0000 mL | Freq: Once | OROMUCOSAL | Status: AC
  Administered 2015-05-22: 15 mL via OROMUCOSAL

## 2015-05-21 MED ORDER — SODIUM CHLORIDE 0.9% FLUSH
3.0000 mL | Freq: Two times a day (BID) | INTRAVENOUS | Status: DC
  Administered 2015-05-21: 3 mL via INTRAVENOUS

## 2015-05-21 MED ORDER — DOPAMINE-DEXTROSE 3.2-5 MG/ML-% IV SOLN
0.0000 ug/kg/min | INTRAVENOUS | Status: AC
  Administered 2015-05-22: 3 ug/kg/min via INTRAVENOUS
  Filled 2015-05-21: qty 250

## 2015-05-21 MED ORDER — EPINEPHRINE HCL 1 MG/ML IJ SOLN
0.0000 ug/min | INTRAVENOUS | Status: DC
  Filled 2015-05-21: qty 4

## 2015-05-21 MED ORDER — HEPARIN (PORCINE) IN NACL 2-0.9 UNIT/ML-% IJ SOLN
INTRAMUSCULAR | Status: AC
  Filled 2015-05-21: qty 1500

## 2015-05-21 MED ORDER — SODIUM CHLORIDE 0.9 % IV BOLUS (SEPSIS)
250.0000 mL | Freq: Once | INTRAVENOUS | Status: AC
Start: 2015-05-21 — End: 2015-05-21
  Administered 2015-05-21: 250 mL via INTRAVENOUS

## 2015-05-21 MED ORDER — NITROGLYCERIN IN D5W 200-5 MCG/ML-% IV SOLN
2.0000 ug/min | INTRAVENOUS | Status: DC
  Filled 2015-05-21: qty 250

## 2015-05-21 MED ORDER — PHENYLEPHRINE HCL 10 MG/ML IJ SOLN
30.0000 ug/min | INTRAMUSCULAR | Status: DC
  Filled 2015-05-21: qty 2

## 2015-05-21 MED ORDER — CEFUROXIME SODIUM 1.5 G IJ SOLR
1.5000 g | INTRAMUSCULAR | Status: AC
  Administered 2015-05-22: 1.5 g via INTRAVENOUS
  Administered 2015-05-22: .75 g via INTRAVENOUS
  Filled 2015-05-21: qty 1.5

## 2015-05-21 MED ORDER — ASPIRIN 81 MG PO CHEW: 81.0000 mg | CHEWABLE_TABLET | Freq: Every day | ORAL | Status: DC

## 2015-05-21 MED ORDER — POTASSIUM CHLORIDE 2 MEQ/ML IV SOLN
80.0000 meq | INTRAVENOUS | Status: DC
  Filled 2015-05-21: qty 40

## 2015-05-21 MED ORDER — ALBUTEROL SULFATE (2.5 MG/3ML) 0.083% IN NEBU
2.5000 mg | INHALATION_SOLUTION | Freq: Once | RESPIRATORY_TRACT | Status: AC
  Administered 2015-05-21: 2.5 mg via RESPIRATORY_TRACT

## 2015-05-21 MED ORDER — VERAPAMIL HCL 2.5 MG/ML IV SOLN
INTRA_ARTERIAL | Status: DC | PRN
  Administered 2015-05-21 (×2): via INTRA_ARTERIAL

## 2015-05-21 MED ORDER — SODIUM CHLORIDE 0.9 % IV SOLN
INTRAVENOUS | Status: AC
  Administered 2015-05-22: 69.8 mL/h via INTRAVENOUS
  Filled 2015-05-21: qty 40

## 2015-05-21 MED ORDER — BISACODYL 5 MG PO TBEC: 5.0000 mg | DELAYED_RELEASE_TABLET | Freq: Once | ORAL | Status: DC

## 2015-05-21 MED ORDER — NITROGLYCERIN 1 MG/10 ML FOR IR/CATH LAB
INTRA_ARTERIAL | Status: AC
  Filled 2015-05-21: qty 10

## 2015-05-21 MED ORDER — VERAPAMIL HCL 2.5 MG/ML IV SOLN
INTRAVENOUS | Status: AC
  Filled 2015-05-21: qty 2

## 2015-05-21 MED ORDER — CHLORHEXIDINE GLUCONATE CLOTH 2 % EX PADS
6.0000 | MEDICATED_PAD | Freq: Once | CUTANEOUS | Status: AC
  Administered 2015-05-21: 6 via TOPICAL

## 2015-05-21 MED ORDER — TEMAZEPAM 15 MG PO CAPS
15.0000 mg | ORAL_CAPSULE | Freq: Once | ORAL | Status: AC | PRN
  Administered 2015-05-22: 15 mg via ORAL
  Filled 2015-05-21: qty 1

## 2015-05-21 MED ORDER — LIDOCAINE HCL (PF) 1 % IJ SOLN
INTRAMUSCULAR | Status: AC
  Filled 2015-05-21: qty 30

## 2015-05-21 MED ORDER — SALINE SPRAY 0.65 % NA SOLN
1.0000 | NASAL | Status: DC | PRN
  Administered 2015-05-21: 1 via NASAL
  Filled 2015-05-21: qty 44

## 2015-05-21 MED ORDER — SODIUM CHLORIDE 0.9 % IV SOLN
INTRAVENOUS | Status: AC
  Administered 2015-05-22: 1 [IU]/h via INTRAVENOUS
  Filled 2015-05-21: qty 2.5

## 2015-05-21 MED ORDER — MORPHINE SULFATE (PF) 2 MG/ML IV SOLN: 2.0000 mg | INTRAVENOUS | Status: DC | PRN

## 2015-05-21 MED ORDER — DEXTROSE 5 % IV SOLN
750.0000 mg | INTRAVENOUS | Status: DC
  Filled 2015-05-21: qty 750

## 2015-05-21 MED FILL — Nitroglycerin IV Soln 200 MCG/ML in D5W: INTRAVENOUS | Qty: 250 | Status: AC

## 2015-05-21 MED FILL — Heparin Sodium (Porcine) 100 Unt/ML in Sodium Chloride 0.45%: INTRAMUSCULAR | Qty: 250 | Status: AC

## 2015-05-21 SURGICAL SUPPLY — 12 items
CATH INFINITI 5FR ANG PIGTAIL (CATHETERS) ×1 IMPLANT
CATH INFINITI JR4 5F (CATHETERS) ×1 IMPLANT
CATH OPTITORQUE TIG 4.0 5F (CATHETERS) ×1 IMPLANT
DEVICE RAD COMP TR BAND LRG (VASCULAR PRODUCTS) ×2 IMPLANT
GLIDESHEATH SLEND A-KIT 6F 22G (SHEATH) ×2 IMPLANT
KIT HEART LEFT (KITS) ×2 IMPLANT
PACK CARDIAC CATHETERIZATION (CUSTOM PROCEDURE TRAY) ×2 IMPLANT
SYR MEDRAD MARK V 150ML (SYRINGE) ×1 IMPLANT
TRANSDUCER W/STOPCOCK (MISCELLANEOUS) ×2 IMPLANT
TUBING CIL FLEX 10 FLL-RA (TUBING) ×2 IMPLANT
WIRE HI TORQ VERSACORE-J 145CM (WIRE) ×2 IMPLANT
WIRE SAFE-T 1.5MM-J .035X260CM (WIRE) ×2 IMPLANT

## 2015-05-21 NOTE — Progress Notes (Signed)
[place] for Heparin Indication: chest pain/ACS  Allergies  Allergen Reactions  . Lipitor [Atorvastatin] Other (See Comments)    Leg pain  . Zocor [Simvastatin] Other (See Comments)    Leg pain    Patient Measurements: Height: 5\' 6"  (167.6 cm) Weight: 162 lb 0.6 oz (73.5 kg) IBW/kg (Calculated) : 63.8  Vital Signs: Temp: 97.8 F (36.6 C) (02/20 1200) Temp Source: Oral (02/20 1200) BP: 106/85 mmHg (02/20 1400) Pulse Rate: 79 (02/20 1400)   Recent Labs  05/19/15 0224  05/19/15 0657 05/19/15 1440 05/20/15 0231 05/21/15 0252  HGB 12.1*  --   --   --  11.6* 12.3*  HCT 35.6*  --   --   --  34.7* 36.9*  PLT 307  --   --   --  317 315  APTT 108*  --   --   --   --   --   LABPROT 15.9*  --   --   --   --   --   INR 1.25  --   --   --   --   --   HEPARINUNFRC  --   < > 0.28* 0.33 0.65 0.68  CREATININE 1.06  --   --   --  1.05 1.13  TROPONINI 12.59*  --  24.01* 25.15*  --   --   < > = values in this interval not displayed.  Estimated Creatinine Clearance: 52.5 mL/min (by C-G formula based on Cr of 1.13).  Assessment: 75 y.o. male presented w/ chest pain, found to have NSTEMI w/ trop elevated to 25.15.  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 Orders to restart heparin tonight for planned CABG in am. HL at upper end of goal this am prior to cath will reduce rate at restart post-cath.   Goal of Therapy:  Heparin level 0.3-0.7 units/ml Monitor platelets by anticoagulation protocol: Yes   Plan:  - Reduce heparin to 1100 units/hr at restart - Daily heparin level and CBC  Erin Hearing PharmD., BCPS Clinical Pharmacist Pager [phone] 05/21/2015 3:49 PM

## 2015-05-21 NOTE — Interval H&P Note (Signed)
Cath Lab Visit (complete for each Cath Lab visit)  Clinical Evaluation Leading to the Procedure:   ACS: Yes.    Non-ACS:    Anginal Classification: CCS IV  Anti-ischemic medical therapy: No Therapy  Non-Invasive Test Results: No non-invasive testing performed  Prior CABG: No previous CABG      History and Physical Interval Note:  05/21/2015 11:03 AM  William [name]  has presented today for surgery, with the diagnosis of HF  The various methods of treatment have been discussed with the patient and family. After consideration of risks, benefits and other options for treatment, the patient has consented to  Procedure(s): Left Heart Cath and Coronary Angiography (N/A) as a surgical intervention .  The patient's history has been reviewed, patient examined, no change in status, stable for surgery.  I have reviewed the patient's chart and labs.  Questions were answered to the patient's satisfaction.     [name]

## 2015-05-21 NOTE — Progress Notes (Addendum)
Patient ID: William [name], male   DOB: January 02, 1941, 75 y.o.   MRN: [id number]      [address]       [place] [address]             [id number]        [facility] Record [id number] Date of Birth: 06-06-1940  Referring: No ref. provider found Primary Care: Monico Blitz, MD  Chief Complaint:   No chief complaint on file.   History of Present Illness:     Patient is a 75 yo  Male  with known  HTN, HLD, COPD, tobacco abuse stopped 1993, previous alcohol abuse, PVD s/p right external iliac stent, and carotid stenosis who presented to [facility] with chest pain on 2/18. Patient reports chest pain over the last month, episode on the 2/17 was more severe and lasted longer today, prompting him to seek medical care in the early morning of 2/18. This episode started at 4 pm on 2/17 and feels like a pressure across his chest, radiating to his left shoulder/arm and occasionally to the back of his neck. He has SOB at baseline, which he attributes to his COPD, but denies increased SOB. He denies N/V or diaphoresis. He denies any history of coronary disease.  He took 1 aspirin on the way to the ED and the chest pain improved. In the ED, he was started on a nitro gtt at 10 mcg and his symptoms completely resolved.  On arrival to [facility], he is chest pain free and has no complaints.  Work-up at  [facility] ED on the 2/18 : Troponin T 0.35 (uln 0.01) Pro-BNP 1950 ABG: 7.48/40/72 (RA) CXR - no acute process Initial EKG at 2124 with inferolateral ST depressions with elevated in V1 and aVR; repeat EKG at 2205 and 2324 - ST depressions had resolved He was given morphine, Zofran, ASA 162 mg, metoprolol 5 mg IV x1, and nitro, No Plavix was given  Lab Results  Component Value Date   TROPONINI 25.15* 05/19/2015   He has  Previous history of  right external iliac artery stent placement in January 2011.  He also has bilateral carotid artery  stenosis.   Claudication pain in both lower legs after walking about 50 feet, worse than year ago.  Current Activity/ Functional Status: Patient is independent with mobility/ambulation, transfers, ADL's, IADL's.   Zubrod Score: At the time of surgery this patient's most appropriate activity status/level should be described as: []     0    Normal activity, no symptoms [x]     1    Restricted in physical strenuous activity but ambulatory, able to do out light work []     2    Ambulatory and capable of self care, unable to do work activities, up and about                 more than 50%  Of the time                            []     3    Only limited self care, in bed greater than 50% of waking hours []     4    Completely disabled, no self care, confined to bed or chair []     5    Moribund  Past Medical History  Diagnosis Date  . Hypertension   . Hyperlipidemia   . Peripheral  arterial disease ([place])   . Diabetes mellitus     Type II  . Colon polyp   . Carotid artery occlusion   . 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 COPD (chronic obstructive pulmonary disease) [facility])     Past Surgical History  Procedure Laterality Date  . Hiatal hernia repair    . Percutaneous translumnial angioplasty  04/23/2009    Catheterization of [place] external iliac artery with Left lower extremity runoff  . Bilateral renal  artery stenoses  04/23/2009    History  Smoking status  . Former Smoker -- 2.00 packs/day  . Types: Cigarettes  . Quit date: 11/17/1991  Smokeless tobacco  . Never Used    History  Alcohol Use  . 1.2 - 2.4 oz/week  . 2-4 Standard drinks or equivalent per week    Social History   Social History  . Marital Status: Married    Spouse Name: N/A  . Number of Children: N/A  . Years of Education: N/A   Occupational History  . 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 Smoking status: Former Smoker -- 2.00 packs/day    Types: Cigarettes     Quit date: 11/17/1991  . Smokeless tobacco: Never Used  . Alcohol Use: 1.2 - 2.4 oz/week    2-4 Standard drinks or equivalent per week  . Drug Use: No  . Sexual Activity: Not on file            Allergies  Allergen Reactions  . Lipitor [Atorvastatin] Other (See Comments)    Leg pain  . Zocor [Simvastatin] Other (See Comments)    Leg pain    Current Facility-Administered Medications  Medication Dose Route Frequency Provider Last Rate Last Dose  . 0.9 %  sodium chloride infusion  250 mL Intravenous PRN Lorretta Harp, MD      . 0.9 %  sodium chloride infusion   Intravenous Continuous Lorretta Harp, MD      . acetaminophen (TYLENOL) tablet 650 mg  650 mg Oral Q4H PRN Soyla Dryer, MD   650 mg at 05/20/15 1832  . acetaminophen (TYLENOL) tablet 650 mg  650 mg Oral Q4H PRN Lorretta Harp, MD      . antiseptic oral rinse (CPC / CETYLPYRIDINIUM CHLORIDE 0.05%) solution 7 mL  7 mL Mouth Rinse BID Soyla Dryer, MD   7 mL at 05/20/15 2205  . aspirin chewable tablet 81 mg  81 mg Oral Daily Lorretta Harp, MD      . aspirin EC tablet 81 mg  81 mg Oral Daily Soyla Dryer, MD   81 mg at 05/20/15 1014  . gabapentin (NEURONTIN) capsule 300 mg  300 mg Oral TID Soyla Dryer, MD   300 mg at 05/21/15 [id number]  . heparin ADULT infusion 100 units/mL (25000 units/250 mL)  1,200 Units/hr Intravenous Continuous Roma Schanz, RPH   Stopped at 05/21/15 1000  . metoprolol tartrate (LOPRESSOR) tablet 25 mg  25 mg Oral 4 times per day Soyla Dryer, MD   25 mg at 05/21/15 0604  . morphine 2 MG/ML injection 2 mg  2 mg Intravenous Q1H PRN Lorretta Harp, MD      . nitroGLYCERIN (NITROSTAT) SL tablet 0.4 mg  0.4 mg Sublingual Q5 Min x 3 PRN Soyla Dryer, MD      . nitroGLYCERIN 50 mg in dextrose 5 % 250 mL (0.2 mg/mL) infusion  0-200 mcg/min Intravenous Titrated Soyla Dryer, MD 3 mL/hr at 05/21/15 1149 10 mcg/min at 05/21/15 1149  . ondansetron (ZOFRAN) injection 4 mg  4 mg  Intravenous Q6H PRN Soyla Dryer, MD      . ondansetron [facility]) injection 4 mg  4 mg Intravenous Q6H PRN Lorretta Harp, MD      . rosuvastatin (CRESTOR) tablet 10 mg  10 mg Oral q1800 Soyla Dryer, MD   10 mg at 05/20/15 1713  . sodium chloride (OCEAN) 0.65 % nasal spray 1 spray  1 spray Each Nare PRN Dellia Nims, MD   1 spray at 05/21/15 0904  . sodium chloride flush (NS) 0.9 % injection 3 mL  3 mL Intravenous Q12H Lorretta Harp, MD      . sodium chloride flush (NS) 0.9 % injection 3 mL  3 mL Intravenous PRN Lorretta Harp, MD      . tiotropium [facility]) inhalation capsule 18 mcg  18 mcg Inhalation Daily Soyla Dryer, MD   18 mcg at 05/21/15 0900    Prescriptions prior to admission  Medication Sig Dispense Refill Last Dose  . Ascorbic Acid (VITAMIN C) 1000 MG tablet Take 1,000 mg by mouth daily.   05/18/2015 at Unknown time  . aspirin 81 MG tablet Take 81 mg by mouth every evening.      . fish oil-omega-3 fatty acids 1000 MG capsule Take 1,000 g by mouth 2 (two) times daily.   05/18/2015 at Unknown time  . Garlic [id number] MG TABS Take 1,250 mg by mouth daily.    05/18/2015 at Unknown time  . losartan-hydrochlorothiazide (HYZAAR) 100-25 MG tablet Take 1 tablet by mouth daily.  3 05/17/2015  . Multiple Vitamin (MULTIVITAMIN) tablet Take 1 tablet by mouth daily.   05/18/2015 at Unknown time  . omeprazole (PRILOSEC) 20 MG capsule Take 20 mg by mouth daily.  5 05/18/2015 at Unknown time  . rosuvastatin (CRESTOR) 5 MG tablet Take 5 mg by mouth daily.   05/18/2015 at Unknown time  . SPIRIVA HANDIHALER 18 MCG inhalation capsule Place 18 mcg into inhaler and inhale daily.   8   . thiamine (VITAMIN B-1) 100 MG tablet Take 100 mg by mouth daily.   05/18/2015 at Unknown time  . vitamin E 400 UNIT capsule Take 400 Units by mouth daily.   05/18/2015 at Unknown time  . gabapentin (NEURONTIN) 300 MG capsule Take 600 mg by mouth every evening.    05/17/2015  . labetalol (NORMODYNE) 300 MG tablet Take  300 mg by mouth 2 (two) times daily.   unknown  . [DISCONTINUED] atorvastatin (LIPITOR) 10 MG tablet Take 10 mg by mouth daily.   Taking    Family History  Problem Relation Age of Onset  . Heart disease Father     Heart Disease before age 32  . Pulmonary embolism Father   . Deep vein thrombosis Father   . Cancer Mother     Sarcoma     Review of Systems:      Cardiac Review of Systems: Y or N  Chest Pain [  y  ]  Resting SOB [  n ] Exertional SOB  [ y ]  Orthopnea [ n ]   Pedal Edema [  n ]    Palpitations [ n ] Syncope  [ n ]   Presyncope [n   ]  General Review of Systems: [Y] = yes [  ]=no Constitional: recent weight change [ n ];  anorexia [  ]; fatigue [ y ]; nausea [  ]; night sweats [  ]; fever [  ]; or chills [  ]                                                               Dental: poor dentition[  ]; Last Dentist visit:   Eye : blurred vision [  ]; diplopia [   ]; vision changes [  ];  Amaurosis fugax[  ]; Resp: cough [ y ];  wheezing[y  ];  hemoptysis[  n]; shortness of breath[ y ]; paroxysmal nocturnal dyspnea[ n ]; dyspnea on exertion[y  ]; or orthopnea[  ];  GI:  gallstones[ n ], vomiting[n  ];  dysphagia[ n ]; melena[  ];  hematochezia [n  ]; heartburn[ y ];   Hx of  Colonoscopy[  ]; GU: kidney stones [  ]; hematuria[  ];   dysuria [  ];  nocturia[  ];  history of     obstruction [  ]; urinary frequency [  ]             Skin: rash, swelling[  ];, hair loss[  ];  peripheral edema[  ];  or itching[  ]; Musculosketetal: myalgias[ y ];  joint swelling[  ];  joint erythema[  ];  joint pain[  ];  back pain[  ];  Heme/Lymph: bruising[  ];  bleeding[  ];  anemia[  ];  Neuro: TIA[ n ];  headaches[  ];  stroke[  ];  vertigo[  ];  seizures[ n ];   paresthesias[  ];  difficulty walking[ n ];  Psych:depression[  ]; anxiety[  ];  Endocrine: diabetes[n  ];  thyroid dysfunction[n  ];  Immunizations: Flu [ n ]; Pneumococcal[ n ];  Other:  Physical Exam: BP 106/79 mmHg  Pulse 93   Temp(Src) 97.6 F (36.4 C) (Oral)  Resp 14  Ht 5\' 6"  (1.676 m)  Wt 162 lb 0.6 oz (73.5 kg)  BMI 26.17 kg/m2  SpO2 98%   General appearance: alert, cooperative, appears older than stated age and no distress Head: Normocephalic, without obvious abnormality, atraumatic Neck: no adenopathy, no carotid bruit, no JVD, supple, symmetrical, trachea midline and thyroid not enlarged, symmetric, no tenderness/mass/nodules Lymph nodes: Cervical, supraclavicular, and axillary nodes normal. Resp: diminished breath sounds bibasilar Back: symmetric, no curvature. ROM normal. No CVA tenderness. Cardio: regular rate and rhythm, S1, S2 normal, no murmur, click, rub or gallop GI: soft, non-tender; bowel sounds normal; no masses,  no organomegaly Extremities: see below Neurologic: Grossly normal  Appears to have adequate vein both legs  Right radial band on from cath Extremities without ischemic changes  without Gangrene; without open wounds.     LE Pulses LEFT RIGHT   FEMORAL  palpable palpable    POPLITEAL not palpable  not palpable   POSTERIOR TIBIAL not palpable  not palpable    DORSALIS PEDIS  ANTERIOR TIBIAL not palpable  Not palpable                       Diagnostic Studies & Laboratory data:     Recent Radiology Findings:  Dg Chest Port 1 View  05/19/2015  CLINICAL DATA:  MI EXAM: PORTABLE CHEST 1  VIEW COMPARISON:  Yesterday FINDINGS: Upper normal heart size. Left mid lung atelectasis for scar. Vascular congestion and basilar edema have resolved. No pneumothorax. IMPRESSION: Resolved vascular congestion and basilar edema. Electronically Signed   By: Marybelle Killings M.D.   On: 05/19/2015 08:20    I have independently reviewed the above radiologic studies.  Recent Lab  Findings: Lab Results  Component Value Date   WBC 9.5 05/21/2015   HGB 12.3* 05/21/2015   HCT 36.9* 05/21/2015   PLT 315 05/21/2015   GLUCOSE 100* 05/21/2015   CHOL 137 05/19/2015   TRIG 74 05/19/2015   HDL 33* 05/19/2015   LDLCALC 89 05/19/2015   ALT 23 05/19/2015   AST 103* 05/19/2015   NA 134* 05/21/2015   K 4.4 05/21/2015   CL 98* 05/21/2015   CREATININE 1.13 05/21/2015   BUN 18 05/21/2015   CO2 26 05/21/2015   TSH 2.894 05/19/2015   INR 1.25 05/19/2015   HGBA1C 5.9* 05/19/2015      ECHO: 05/19/2015 Study Conclusions  - Left ventricle: The cavity size was normal. Wall thickness was increased in a pattern of mild LVH. Systolic function was normal. The estimated ejection fraction was in the range of 50% to 55%. Severe hypokinesis of the mid-apicalanteroseptal myocardium. Doppler parameters are consistent with abnormal left ventricular relaxation (grade 1 diastolic dysfunction). - Aortic valve: There was mild to moderate regurgitation. - Left atrium: The atrium was moderately dilated. - Right atrium: The atrium was mildly dilated.  Transthoracic echocardiography. M-mode, complete 2D, spectral Doppler, and color Doppler. Birthdate: Patient birthdate: 1940/07/01. Age: Patient is 75 yr old. Sex: Gender: male. BMI: 25.7 kg/m^2. Blood pressure:   116/63 Patient status: Inpatient. Study date: Study date: 05/19/2015. Study time: 02:41 PM. Location: Bedside.  -------------------------------------------------------------------  ------------------------------------------------------------------- Left ventricle: The cavity size was normal. Wall thickness was increased in a pattern of mild LVH. Systolic function was normal. The estimated ejection fraction was in the range of 50% to 55%. Regional wall motion abnormalities:  Severe hypokinesis of the mid-apicalanteroseptal myocardium. Doppler parameters are consistent with abnormal left ventricular  relaxation (grade 1 diastolic dysfunction).  ------------------------------------------------------------------- Aortic valve:  The valve appears to be grossly normal.  Doppler: There was mild to moderate regurgitation.  ------------------------------------------------------------------- Aorta: Aortic root: The aortic root was normal in size. Ascending aorta: The ascending aorta was normal in size.  ------------------------------------------------------------------- Mitral valve:  The valve appears to be grossly normal.  Doppler: There was trivial regurgitation.  ------------------------------------------------------------------- Left atrium: The atrium was moderately dilated.  ------------------------------------------------------------------- Right ventricle: The cavity size was normal. Systolic function was normal.  ------------------------------------------------------------------- Pulmonic valve:  The valve appears to be grossly normal. Doppler: There was no significant regurgitation.  ------------------------------------------------------------------- Tricuspid valve:  Structurally normal valve.  Leaflet separation was normal. Doppler: Transvalvular velocity was within the normal range. There was no significant regurgitation.  ------------------------------------------------------------------- Right atrium: The atrium was mildly dilated.  ------------------------------------------------------------------- Pericardium: There was no pericardial effusion.  ------------------------------------------------------------------- Measurements  Left ventricle              Value    Reference LV ID, ED, PLAX chordal     (L)   41  mm   43 - 52 LV ID, ES, PLAX chordal         27  mm   23 - 38 LV fx shortening, PLAX chordal      34  %   >=29 LV PW thickness, ED  14  mm    --------- IVS/LV PW ratio, ED           1.07     <=1.3 LV e&', lateral              4.79 cm/s  --------- LV e&', medial              5.33 cm/s  --------- LV e&', average              5.06 cm/s  ---------  Ventricular septum            Value    Reference IVS thickness, ED            15  mm   ---------  Aortic valve               Value    Reference Aortic regurg pressure half-time     359  ms   ---------  Aorta                  Value    Reference Aortic root ID, ED            32  mm   ---------  Left atrium               Value    Reference LA ID, A-P, ES              49  mm   --------- LA ID/bsa, A-P          (H)   2.6  cm/m^2 <=2.2 LA volume, S               78.8 ml   --------- LA volume/bsa, S             41.8 ml/m^2 --------- LA volume, ES, 1-p A4C          84  ml   --------- LA volume/bsa, ES, 1-p A4C        44.5 ml/m^2 --------- LA volume, ES, 1-p A2C          62.6 ml   --------- LA volume/bsa, ES, 1-p A2C        33.2 ml/m^2 ---------  Mitral valve               Value    Reference Mitral deceleration time         183  ms   150 - 230 Mitral E/A ratio, peak          0.8     ---------  Systemic veins              Value    Reference Estimated CVP              3   mm Hg ---------  Right ventricle             Value    Reference TAPSE                  21.9 mm   ---------  Legend: (L) and (H) mark values outside specified reference range.  ------------------------------------------------------------------- Prepared and Electronically Authenticated by  Mertie Moores, M.D. 2017-02-18T17:02:45  CATH:   by Dr Gwenlyn Found  Coronary Findings    Dominance: Right   Left Main   . Ost LM lesion, 99% stenosed.     Left Anterior Descending   . Ost LAD to Prox LAD lesion, 95% stenosed.   . Mid LAD lesion,  95% stenosed.     I have independently reviewed the above  cath films and reviewed the findings with the  patient . 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 The risks of the procedure including death, infection, stroke, myocardial infarction, bleeding, blood transfusion have all been discussed specifically.  I have quoted Verlee Monte a 4 % of perioperative mortality and a complication rate as high as 40 %. The patient's questions have been answered.Verlee Monte is willing  to proceed with the planned procedure.  In addition to other potential risks and complications from the surgery, I have made the patient aware of the recent [place] concerning the risk of infection by Myocobacterium chimaera related to the use of [employer]ert 3T heater-cooler equipment during cardiac surgery. I discussed with the patient the low risk of infection, as well as our compliance with the most current FDA recommendations to minimize infection and testing of all devices for contamination. The patient has been made aware of the limited alternatives to immediately replacing the current equipment. The patient has been informed regarding  the risks associated with waiting to proceed with needed surgery and that such risks are greater than the risk of infection related to the use of the heater-cooler device. I did make the patient aware that after careful review of the patients having cardiac surgery at [facility] we have no evidence that heater/cooler related infections have occurred at [facility]. We discussed that this is a slow-growing bacterium, such that it can take some period of time for symptoms to develop.  I  spent 40 minutes counseling the patient face to face and 50% or more the  time was spent in counseling and coordination of care. The total time spent in the appointment was 60 minutes.    [name] MD      [address] [place] [address] Office [phone]   Murlean Hark [phone]  05/21/2015 12:18 PM    Follow up vascular study and review of previous outpatient studies reviewed, Patient has decreased BP in left arm , 2016 study had 70 mmHG difference , left bp lower then right. 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 Patience of IMA not assessed in cath lab today.

## 2015-05-21 NOTE — Progress Notes (Signed)
Paged PA [facility] regarding patient's blood pressure.  Patient had cardiac catheterization so unable to take blood pressure in right arm and it is known that blood pressure is lower in left arm compared to right as patient may have left subclavian stenosis, however BP significantly lower than it was originally.  Patient asymptomatic  PA [facility] requested patient receive a 250 NS bolus and post bolus to take BP in right arm once and then resume in left arm to compare BP readings.  PA Meng advised ok for SBP to be 90 or greater.  PA Eulas Post also advised that if patient experiences any chest pain to obtain an EKG and paged cardiology due to patient has left main disease.

## 2015-05-21 NOTE — Progress Notes (Addendum)
CARDIAC REHAB PHASE I   Pre-op education completed with pt and family at bedside. Reviewed sternal precautions, IS, activity progression, cardiac surgery booklet and cardiac surgery guidelines. Gave pt instructions to view cardiac surgery videos. Pt verbalized understanding. Pt states he lives alone, pt's daughter plans to provide care for him upon discharge from hospital. Pt in bed, call bell within reach. Will follow post-op.   [place], RN, BSN 05/21/2015 2:24 PM

## 2015-05-21 NOTE — Progress Notes (Signed)
[place] for Heparin Indication: chest pain/ACS  Allergies  Allergen Reactions  . Lipitor [Atorvastatin] Other (See Comments)    Leg pain  . Zocor [Simvastatin] Other (See Comments)    Leg pain    Patient Measurements: Height: 5\' 6"  (167.6 cm) Weight: 162 lb 0.6 oz (73.5 kg) IBW/kg (Calculated) : 63.8  Vital Signs: Temp: 97.6 F (36.4 C) (02/20 0800) Temp Source: Oral (02/20 0800) BP: 121/83 mmHg (02/20 0604) Pulse Rate: 75 (02/20 0604)   Recent Labs  05/19/15 0224  05/19/15 0657 05/19/15 1440 05/20/15 0231 05/21/15 0252  HGB 12.1*  --   --   --  11.6* 12.3*  HCT 35.6*  --   --   --  34.7* 36.9*  PLT 307  --   --   --  317 315  APTT 108*  --   --   --   --   --   LABPROT 15.9*  --   --   --   --   --   INR 1.25  --   --   --   --   --   HEPARINUNFRC  --   < > 0.28* 0.33 0.65 0.68  CREATININE 1.06  --   --   --  1.05 1.13  TROPONINI 12.59*  --  24.01* 25.15*  --   --   < > = values in this interval not displayed.  Estimated Creatinine Clearance: 52.5 mL/min (by C-G formula based on Cr of 1.13).  Assessment: 75 y.o. male presented w/ chest pain, found to have NSTEMI w/ trop elevated to 25.15. Planning for cath today. Heparin level is now at goal (0.68) on 1200 units/hr.   Hgb 12.2, PLT 315, no bleeding   Goal of Therapy:  Heparin level 0.3-0.7 units/ml Monitor platelets by anticoagulation protocol: Yes   Plan:  - Continue heparin 1200 units/hr - F/U heparin plans after cath   [place] Kirchner C. Lennox Grumbles, PharmD Pharmacy Resident  Pager: [phone] 05/21/2015 10:33 AM

## 2015-05-21 NOTE — H&P (View-Only) (Signed)
SUBJECTIVE: denies any chest pain, sob, n/v, cough, or any other symptom currently. Has been chest pain free since EMS arrived at his house on 2/17 night after giving him aspirin.   BP 121/83 mmHg  Pulse 75  Temp(Src) 97.6 F (36.4 C) (Oral)  Resp 17  Ht 5\' 6"  (1.676 m)  Wt 73.5 kg (162 lb 0.6 oz)  BMI 26.17 kg/m2  SpO2 95%  Intake/Output Summary (Last 24 hours) at 05/21/15 [id number] Last data filed at 05/21/15 0818  Gross per 24 hour  Intake   1696 ml  Output   2675 ml  Net   -979 ml    PHYSICAL EXAM General: Well developed, well nourished, in no acute distress. Alert and oriented x 3.  Psych:  Good affect, responds appropriately Neck: No JVD. No masses noted.  Lungs: Clear bilaterally with no wheezes or rhonci noted.  Heart: RRR with no murmurs noted. Abdomen: Bowel sounds are present. Soft, non-tender.  Extremities: No lower extremity edema.   LABS: Basic Metabolic Panel:  Recent Labs  05/20/15 0231 05/21/15 0252  NA 134* 134*  K 4.0 4.4  CL 98* 98*  CO2 27 26  GLUCOSE 98 100*  BUN 16 18  CREATININE 1.05 1.13  CALCIUM 9.0 9.2  MG 1.8 1.9   CBC:  Recent Labs  05/19/15 0224 05/20/15 0231 05/21/15 0252  WBC 8.0 10.0 9.5  NEUTROABS 5.0  --   --   HGB 12.1* 11.6* 12.3*  HCT 35.6* 34.7* 36.9*  MCV 88.8 90.6 90.9  PLT 307 317 315   Cardiac Enzymes:  Recent Labs  05/19/15 0224 05/19/15 0657 05/19/15 1440  TROPONINI 12.59* 24.01* 25.15*   Fasting Lipid Panel:  Recent Labs  05/19/15 0224  CHOL 137  HDL 33*  LDLCALC 89  TRIG 74  CHOLHDL 4.2    Current Meds: . antiseptic oral rinse  7 mL Mouth Rinse BID  . aspirin EC  81 mg Oral Daily  . gabapentin  300 mg Oral TID  . metoprolol tartrate  25 mg Oral 4 times per day  . rosuvastatin  10 mg Oral q1800  . sodium chloride flush  3 mL Intravenous Q12H  . tiotropium  18 mcg Inhalation Daily     ASSESSMENT AND PLAN:  Active Problems:   NSTEMI (non-ST elevated myocardial infarction)  ([place])   75 yo male with hx of COPD, HTN, HLD, PVD,  (ex smoker, quit 20 years ago), no prior cardiac hx, here with CP across top of his chest b/l with occasional radiation to the left arm resented to [place] initially on 2/17 night. His CP resolved with 1 ASA en route by EMS. At that time his EKG had inferolateral ST depression but resolved on repeat EKG. Initial trop was 0.35, and transferred to [place] for NSTEMI.  NSTEMI - TIMI score of 5. Initially had ST depression but has resolved on repeat and currently does not have any ST changes on EKG. Significant trop 25.15. Has been chest pain free in the hospital. Plan is for [facility] later this afternoon.  - continue heparin, asa, can d/c nitro gtt as currenlty CP free. Cont metoprolol  - on crestor 10mg , will need to increase this to 20mg  if CAD is diagnosed on Cath.   HTN - on home he's on losartan-HCTZ and labetalol. currenlty on metoprolol. Hold losartan-hctz for cath. HLD - on crestor 10mg  currenlty. May consdier increasing to 20mg  daily.  COPD - cont spiriva + prn  SABA  Ahmed, Chesley Mires  2/20/20178:24 AM  Attending Note:   The patient was seen and examined.  Agree with assessment and plan as noted above.  Changes made to the above note as needed.  Pt is pain free at this point.  Going for cath right now.    Agree with plans to increase his statin. LDL of 89 - goal of < 70 .   His HR is a bit fast for someone on metoprolol 100 mg a day .   May need to be increased.     Thayer Headings, Brooke Bonito., MD, [facility] 05/21/2015, 10:29 AM [address],  [place] Pager [phone]

## 2015-05-21 NOTE — Progress Notes (Signed)
SUBJECTIVE: denies any chest pain, sob, n/v, cough, or any other symptom currently. Has been chest pain free since EMS arrived at his house on 2/17 night after giving him aspirin.   BP 121/83 mmHg  Pulse 75  Temp(Src) 97.6 F (36.4 C) (Oral)  Resp 17  Ht 5\' 6"  (1.676 m)  Wt 73.5 kg (162 lb 0.6 oz)  BMI 26.17 kg/m2  SpO2 95%  Intake/Output Summary (Last 24 hours) at 05/21/15 [id number] Last data filed at 05/21/15 0818  Gross per 24 hour  Intake   1696 ml  Output   2675 ml  Net   -979 ml    PHYSICAL EXAM General: Well developed, well nourished, in no acute distress. Alert and oriented x 3.  Psych:  Good affect, responds appropriately Neck: No JVD. No masses noted.  Lungs: Clear bilaterally with no wheezes or rhonci noted.  Heart: RRR with no murmurs noted. Abdomen: Bowel sounds are present. Soft, non-tender.  Extremities: No lower extremity edema.   LABS: Basic Metabolic Panel:  Recent Labs  05/20/15 0231 05/21/15 0252  NA 134* 134*  K 4.0 4.4  CL 98* 98*  CO2 27 26  GLUCOSE 98 100*  BUN 16 18  CREATININE 1.05 1.13  CALCIUM 9.0 9.2  MG 1.8 1.9   CBC:  Recent Labs  05/19/15 0224 05/20/15 0231 05/21/15 0252  WBC 8.0 10.0 9.5  NEUTROABS 5.0  --   --   HGB 12.1* 11.6* 12.3*  HCT 35.6* 34.7* 36.9*  MCV 88.8 90.6 90.9  PLT 307 317 315   Cardiac Enzymes:  Recent Labs  05/19/15 0224 05/19/15 0657 05/19/15 1440  TROPONINI 12.59* 24.01* 25.15*   Fasting Lipid Panel:  Recent Labs  05/19/15 0224  CHOL 137  HDL 33*  LDLCALC 89  TRIG 74  CHOLHDL 4.2    Current Meds: . antiseptic oral rinse  7 mL Mouth Rinse BID  . aspirin EC  81 mg Oral Daily  . gabapentin  300 mg Oral TID  . metoprolol tartrate  25 mg Oral 4 times per day  . rosuvastatin  10 mg Oral q1800  . sodium chloride flush  3 mL Intravenous Q12H  . tiotropium  18 mcg Inhalation Daily     ASSESSMENT AND PLAN:  Active Problems:   NSTEMI (non-ST elevated myocardial infarction)  ([place])   75 yo male with hx of COPD, HTN, HLD, PVD,  (ex smoker, quit 20 years ago), no prior cardiac hx, here with CP across top of his chest b/l with occasional radiation to the left arm resented to [place] initially on 2/17 night. His CP resolved with 1 ASA en route by EMS. At that time his EKG had inferolateral ST depression but resolved on repeat EKG. Initial trop was 0.35, and transferred to [place] for NSTEMI.  NSTEMI - TIMI score of 5. Initially had ST depression but has resolved on repeat and currently does not have any ST changes on EKG. Significant trop 25.15. Has been chest pain free in the hospital. Plan is for [facility] later this afternoon.  - continue heparin, asa, can d/c nitro gtt as currenlty CP free. Cont metoprolol  - on crestor 10mg , will need to increase this to 20mg  if CAD is diagnosed on Cath.   HTN - on home he's on losartan-HCTZ and labetalol. currenlty on metoprolol. Hold losartan-hctz for cath. HLD - on crestor 10mg  currenlty. May consdier increasing to 20mg  daily.  COPD - cont spiriva + prn  SABA  Ahmed, Chesley Mires  2/20/20178:24 AM  Attending Note:   The patient was seen and examined.  Agree with assessment and plan as noted above.  Changes made to the above note as needed.  Pt is pain free at this point.  Going for cath right now.    Agree with plans to increase his statin. LDL of 89 - goal of < 70 .   His HR is a bit fast for someone on metoprolol 100 mg a day .   May need to be increased.     Thayer Headings, Brooke Bonito., MD, [facility] 05/21/2015, 10:29 AM [address]  [place] Pager [phone]

## 2015-05-21 NOTE — Progress Notes (Signed)
   05/21/15 0940  Clinical Encounter Type  Visited With Patient and family together;Health care provider  Visit Type Initial;Pre-op  Referral From Nurse;Patient  [place] responded to a request to complete an advance directive. Chaplain helped patient fill out forms and answered questions along the way. Chaplain will secure notary this morning before surgery to finalize. Chaplain support available as needed.   Jeri Lager, Chaplain 05/21/2015 9:42 AM

## 2015-05-21 NOTE — Progress Notes (Signed)
   05/21/15 1015  Clinical Encounter Type  Visited With Patient and family together;Health care provider  Visit Type Follow-up;Pre-op  Referral From Patient  William [name] was able to secure a notary and hospital volunteers to complete the advance directive paperwork. Patient's RN is aware of paperwork, and indicated she would handle the process of making copies, placing the form in patient's chart, etc. Patient expressed no other needs at this time, and chaplain services are available as needed.   Jeri Lager, Chaplain 05/21/2015 10:17 AM

## 2015-05-21 NOTE — Progress Notes (Signed)
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 Antegrade right vertebral. Retrograde left vertebral.  Upper Extremity Right Left  Brachial Pressures 135 89  Radial Waveforms Tri mono  Ulnar Waveforms Tri mono  Palmar Arch (Allen's Test) Obliterates with radial compression, normal with ulnar compression Obliterates with radial compression, normal with ulnar compression       Lower  Extremity Right Left  Dorsalis Pedis    Anterior Tibial 46, damp mono 28, damp mono  Posterior Tibial 30, damp mono 73, mono  Ankle/Brachial Indices 0.34 0.54    Landry Mellow, RDMS, RVT 05/21/2015

## 2015-05-21 NOTE — Anesthesia Preprocedure Evaluation (Addendum)
Anesthesia Evaluation  Patient identified by MRN, date of birth, ID band Patient awake    Reviewed: Allergy & Precautions, NPO status , Patient's Chart, lab work & pertinent test results, reviewed documented beta blocker date and time   Airway Mallampati: II  TM Distance: >3 FB Neck ROM: Full    Dental  (+) Teeth Intact   Pulmonary COPD, former smoker,    breath sounds clear to auscultation       Cardiovascular hypertension, Pt. on home beta blockers and Pt. on medications + Past MI and + Peripheral Vascular Disease   Rhythm:Regular Rate:Normal  Echo 05/19/2015 - Left ventricle: The cavity size was normal. Wall thickness wasincreased in a pattern of mild LVH. Systolic function was normal.The estimated ejection fraction was in the range of 50% to 55%.Severe hypokinesis of the mid-apicalanteroseptal myocardium. Doppler parameters are consistent with abnormal left ventricularrelaxation (grade 1 diastolic dysfunction). - Aortic valve: There was mild to moderate regurgitation. - Left atrium: The atrium was moderately dilated. - Right atrium: The atrium was mildly dilated.   Neuro/Psych negative neurological ROS  negative psych ROS   GI/Hepatic negative GI ROS, Neg liver ROS,   Endo/Other  diabetes  Renal/GU Renal disease     Musculoskeletal negative musculoskeletal ROS (+)   Abdominal   Peds  Hematology negative hematology ROS (+)   Anesthesia Other Findings   Reproductive/Obstetrics                          Anesthesia Physical Anesthesia Plan  ASA: IV  Anesthesia Plan: General   Post-op Pain Management:    Induction: Intravenous  Airway Management Planned: Oral ETT  Additional Equipment: Arterial line, PA Cath, CVP, TEE and Ultrasound Guidance Line Placement  Intra-op Plan:   Post-operative Plan: Post-operative intubation/ventilation  Informed Consent: I have reviewed the  patients History and Physical, chart, labs and discussed the procedure including the risks, benefits and alternatives for the proposed anesthesia with the patient or authorized representative who has indicated his/her understanding and acceptance.   Dental advisory given  Plan Discussed with: CRNA  Anesthesia Plan Comments:        Anesthesia Quick Evaluation

## 2015-05-22 ENCOUNTER — Encounter (HOSPITAL_COMMUNITY)
Admission: AD | Disposition: A | Discharge status: Home Health | Payer: Self-pay | Source: Other Acute Inpatient Hospital | Attending: Cardiothoracic Surgery

## 2015-05-22 ENCOUNTER — Inpatient Hospital Stay (HOSPITAL_COMMUNITY): Payer: Medicare Other

## 2015-05-22 ENCOUNTER — Inpatient Hospital Stay (HOSPITAL_COMMUNITY): Payer: Medicare Other | Admitting: Certified Registered Nurse Anesthetist

## 2015-05-22 DIAGNOSIS — Z951 Presence of aortocoronary bypass graft: Secondary | ICD-10-CM

## 2015-05-22 HISTORY — PX: TEE WITHOUT CARDIOVERSION: SHX5443

## 2015-05-22 HISTORY — PX: CORONARY ARTERY BYPASS GRAFT: SHX141

## 2015-05-22 LAB — POCT I-STAT 3, ART BLOOD GAS (G3+)
Acid-base deficit: 3 mmol/L — ABNORMAL HIGH (ref 0.0–2.0)
Acid-base deficit: 5 mmol/L — ABNORMAL HIGH (ref 0.0–2.0)
Acid-base deficit: 6 mmol/L — ABNORMAL HIGH (ref 0.0–2.0)
Acid-base deficit: 7 mmol/L — ABNORMAL HIGH (ref 0.0–2.0)
Bicarbonate: 23.1 mEq/L (ref 20.0–24.0)
Bicarbonate: 20.3 mEq/L (ref 20.0–24.0)
Bicarbonate: 19.3 mEq/L — ABNORMAL LOW (ref 20.0–24.0)
Bicarbonate: 17.9 mEq/L — ABNORMAL LOW (ref 20.0–24.0)
O2 Saturation: 100 %
O2 Saturation: 87 %
O2 Saturation: 89 %
O2 Saturation: 90 %
Patient temperature: 36.2
Patient temperature: 37.1
Patient temperature: 37.3
TCO2: 24 mmol/L (ref 0–100)
TCO2: 22 mmol/L (ref 0–100)
TCO2: 20 mmol/L (ref 0–100)
TCO2: 19 mmol/L (ref 0–100)
pCO2 arterial: 44.9 mmHg (ref 35.0–45.0)
pCO2 arterial: 37.3 mmHg (ref 35.0–45.0)
pCO2 arterial: 38.6 mmHg (ref 35.0–45.0)
pCO2 arterial: 35.2 mmHg (ref 35.0–45.0)
pH, Arterial: 7.319 — ABNORMAL LOW (ref 7.350–7.450)
pH, Arterial: 7.341 — ABNORMAL LOW (ref 7.350–7.450)
pH, Arterial: 7.308 — ABNORMAL LOW (ref 7.350–7.450)
pH, Arterial: 7.316 — ABNORMAL LOW (ref 7.350–7.450)
pO2, Arterial: 338 mmHg — ABNORMAL HIGH (ref 80.0–100.0)
pO2, Arterial: 54 mmHg — ABNORMAL LOW (ref 80.0–100.0)
pO2, Arterial: 62 mmHg — ABNORMAL LOW (ref 80.0–100.0)
pO2, Arterial: 63 mmHg — ABNORMAL LOW (ref 80.0–100.0)

## 2015-05-22 LAB — POCT I-STAT, CHEM 8
BUN: 14 mg/dL (ref 6–20)
BUN: 11 mg/dL (ref 6–20)
BUN: 12 mg/dL (ref 6–20)
BUN: 12 mg/dL (ref 6–20)
BUN: 12 mg/dL (ref 6–20)
BUN: 11 mg/dL (ref 6–20)
Calcium, Ion: 1.21 mmol/L (ref 1.13–1.30)
Calcium, Ion: 0.99 mmol/L — ABNORMAL LOW (ref 1.13–1.30)
Calcium, Ion: 1.11 mmol/L — ABNORMAL LOW (ref 1.13–1.30)
Calcium, Ion: 1.08 mmol/L — ABNORMAL LOW (ref 1.13–1.30)
Calcium, Ion: 1.1 mmol/L — ABNORMAL LOW (ref 1.13–1.30)
Calcium, Ion: 1.16 mmol/L (ref 1.13–1.30)
Chloride: 102 mmol/L (ref 101–111)
Chloride: 104 mmol/L (ref 101–111)
Chloride: 96 mmol/L — ABNORMAL LOW (ref 101–111)
Chloride: 102 mmol/L (ref 101–111)
Chloride: 100 mmol/L — ABNORMAL LOW (ref 101–111)
Chloride: 104 mmol/L (ref 101–111)
Creatinine, Ser: 0.7 mg/dL (ref 0.61–1.24)
Creatinine, Ser: 0.5 mg/dL — ABNORMAL LOW (ref 0.61–1.24)
Creatinine, Ser: 0.6 mg/dL — ABNORMAL LOW (ref 0.61–1.24)
Creatinine, Ser: 0.6 mg/dL — ABNORMAL LOW (ref 0.61–1.24)
Creatinine, Ser: 0.7 mg/dL (ref 0.61–1.24)
Creatinine, Ser: 0.8 mg/dL (ref 0.61–1.24)
Glucose, Bld: 102 mg/dL — ABNORMAL HIGH (ref 65–99)
Glucose, Bld: 108 mg/dL — ABNORMAL HIGH (ref 65–99)
Glucose, Bld: 89 mg/dL (ref 65–99)
Glucose, Bld: 102 mg/dL — ABNORMAL HIGH (ref 65–99)
Glucose, Bld: 146 mg/dL — ABNORMAL HIGH (ref 65–99)
Glucose, Bld: 128 mg/dL — ABNORMAL HIGH (ref 65–99)
HCT: 34 % — ABNORMAL LOW (ref 39.0–52.0)
HCT: 24 % — ABNORMAL LOW (ref 39.0–52.0)
HCT: 29 % — ABNORMAL LOW (ref 39.0–52.0)
HCT: 25 % — ABNORMAL LOW (ref 39.0–52.0)
HCT: 26 % — ABNORMAL LOW (ref 39.0–52.0)
HCT: 31 % — ABNORMAL LOW (ref 39.0–52.0)
Hemoglobin: 11.6 g/dL — ABNORMAL LOW (ref 13.0–17.0)
Hemoglobin: 8.2 g/dL — ABNORMAL LOW (ref 13.0–17.0)
Hemoglobin: 9.9 g/dL — ABNORMAL LOW (ref 13.0–17.0)
Hemoglobin: 8.5 g/dL — ABNORMAL LOW (ref 13.0–17.0)
Hemoglobin: 8.8 g/dL — ABNORMAL LOW (ref 13.0–17.0)
Hemoglobin: 10.5 g/dL — ABNORMAL LOW (ref 13.0–17.0)
Potassium: 3.8 mmol/L (ref 3.5–5.1)
Potassium: 3.9 mmol/L (ref 3.5–5.1)
Potassium: 4 mmol/L (ref 3.5–5.1)
Potassium: 4.7 mmol/L (ref 3.5–5.1)
Potassium: 4.3 mmol/L (ref 3.5–5.1)
Potassium: 4.5 mmol/L (ref 3.5–5.1)
Sodium: 136 mmol/L (ref 135–145)
Sodium: 134 mmol/L — ABNORMAL LOW (ref 135–145)
Sodium: 135 mmol/L (ref 135–145)
Sodium: 134 mmol/L — ABNORMAL LOW (ref 135–145)
Sodium: 134 mmol/L — ABNORMAL LOW (ref 135–145)
Sodium: 136 mmol/L (ref 135–145)
TCO2: 23 mmol/L (ref 0–100)
TCO2: 23 mmol/L (ref 0–100)
TCO2: 24 mmol/L (ref 0–100)
TCO2: 23 mmol/L (ref 0–100)
TCO2: 22 mmol/L (ref 0–100)
TCO2: 19 mmol/L (ref 0–100)

## 2015-05-22 LAB — GLUCOSE, CAPILLARY
Glucose-Capillary: 90 mg/dL (ref 65–99)
Glucose-Capillary: 102 mg/dL — ABNORMAL HIGH (ref 65–99)
Glucose-Capillary: 107 mg/dL — ABNORMAL HIGH (ref 65–99)
Glucose-Capillary: 96 mg/dL (ref 65–99)
Glucose-Capillary: 104 mg/dL — ABNORMAL HIGH (ref 65–99)
Glucose-Capillary: 119 mg/dL — ABNORMAL HIGH (ref 65–99)

## 2015-05-22 LAB — CREATININE, SERUM
Creatinine, Ser: 0.86 mg/dL (ref 0.61–1.24)
GFR calc Af Amer: >60 mL/min (ref >60)
GFR calc non Af Amer: >60 mL/min (ref >60)

## 2015-05-22 LAB — BASIC METABOLIC PANEL
Anion gap: 10 (ref 5–15)
BUN: 15 mg/dL (ref 6–20)
CO2: 21 mmol/L — ABNORMAL LOW (ref 22–32)
Calcium: 9 mg/dL (ref 8.9–10.3)
Chloride: 103 mmol/L (ref 101–111)
Creatinine, Ser: 0.92 mg/dL (ref 0.61–1.24)
GFR calc Af Amer: >60 mL/min (ref >60)
GFR calc non Af Amer: >60 mL/min (ref >60)
Glucose, Bld: 102 mg/dL — ABNORMAL HIGH (ref 65–99)
Potassium: 4 mmol/L (ref 3.5–5.1)
Sodium: 134 mmol/L — ABNORMAL LOW (ref 135–145)

## 2015-05-22 LAB — CBC
HCT: 36.2 % — ABNORMAL LOW (ref 39.0–52.0)
HCT: 29.9 % — ABNORMAL LOW (ref 39.0–52.0)
HCT: 31.7 % — ABNORMAL LOW (ref 39.0–52.0)
Hemoglobin: 12.2 g/dL — ABNORMAL LOW (ref 13.0–17.0)
Hemoglobin: 10.1 g/dL — ABNORMAL LOW (ref 13.0–17.0)
Hemoglobin: 10.5 g/dL — ABNORMAL LOW (ref 13.0–17.0)
MCH: 30.3 pg (ref 26.0–34.0)
MCH: 30.7 pg (ref 26.0–34.0)
MCH: 29.9 pg (ref 26.0–34.0)
MCHC: 33.7 g/dL (ref 30.0–36.0)
MCHC: 33.8 g/dL (ref 30.0–36.0)
MCHC: 33.1 g/dL (ref 30.0–36.0)
MCV: 90 fL (ref 78.0–100.0)
MCV: 90.9 fL (ref 78.0–100.0)
MCV: 90.3 fL (ref 78.0–100.0)
Platelets: 315 10*3/uL (ref 150–400)
Platelets: 174 10*3/uL (ref 150–400)
Platelets: 221 10*3/uL (ref 150–400)
RBC: 4.02 MIL/uL — ABNORMAL LOW (ref 4.22–5.81)
RBC: 3.29 MIL/uL — ABNORMAL LOW (ref 4.22–5.81)
RBC: 3.51 MIL/uL — ABNORMAL LOW (ref 4.22–5.81)
RDW: 12.9 % (ref 11.5–15.5)
RDW: 12.9 % (ref 11.5–15.5)
RDW: 12.9 % (ref 11.5–15.5)
WBC: 10.2 10*3/uL (ref 4.0–10.5)
WBC: 12.7 10*3/uL — ABNORMAL HIGH (ref 4.0–10.5)
WBC: 13.5 10*3/uL — ABNORMAL HIGH (ref 4.0–10.5)

## 2015-05-22 LAB — POCT I-STAT 4, (NA,K, GLUC, HGB,HCT)
Glucose, Bld: 113 mg/dL — ABNORMAL HIGH (ref 65–99)
HCT: 29 % — ABNORMAL LOW (ref 39.0–52.0)
Hemoglobin: 9.9 g/dL — ABNORMAL LOW (ref 13.0–17.0)
Potassium: 3.8 mmol/L (ref 3.5–5.1)
Sodium: 137 mmol/L (ref 135–145)

## 2015-05-22 LAB — PLATELET COUNT: Platelets: 191 10*3/uL (ref 150–400)

## 2015-05-22 LAB — PROTIME-INR
INR: 1.74 — ABNORMAL HIGH (ref 0.00–1.49)
Prothrombin Time: 20.3 seconds — ABNORMAL HIGH (ref 11.6–15.2)

## 2015-05-22 LAB — MAGNESIUM
Magnesium: 1.8 mg/dL (ref 1.7–2.4)
Magnesium: 2.5 mg/dL — ABNORMAL HIGH (ref 1.7–2.4)

## 2015-05-22 LAB — HEPARIN LEVEL (UNFRACTIONATED): Heparin Unfractionated: 0.19 IU/mL — ABNORMAL LOW (ref 0.30–0.70)

## 2015-05-22 LAB — HEMOGLOBIN AND HEMATOCRIT, BLOOD
HCT: 23.9 % — ABNORMAL LOW (ref 39.0–52.0)
Hemoglobin: 8.2 g/dL — ABNORMAL LOW (ref 13.0–17.0)

## 2015-05-22 LAB — HEMOGLOBIN A1C
Hgb A1c MFr Bld: 6 % — ABNORMAL HIGH (ref 4.8–5.6)
Mean Plasma Glucose: 126 mg/dL

## 2015-05-22 LAB — APTT: aPTT: 43 seconds — ABNORMAL HIGH (ref 24–37)

## 2015-05-22 SURGERY — CORONARY ARTERY BYPASS GRAFTING (CABG)
Anesthesia: General | Site: Chest

## 2015-05-22 MED ORDER — FAMOTIDINE IN NACL 20-0.9 MG/50ML-% IV SOLN
20.0000 mg | Freq: Two times a day (BID) | INTRAVENOUS | Status: AC
  Administered 2015-05-22 (×2): 20 mg via INTRAVENOUS

## 2015-05-22 MED ORDER — LIDOCAINE HCL (CARDIAC) 20 MG/ML IV SOLN
INTRAVENOUS | Status: AC
  Filled 2015-05-22: qty 5

## 2015-05-22 MED ORDER — SODIUM CHLORIDE 0.9% FLUSH: 3.0000 mL | INTRAVENOUS | Status: DC | PRN

## 2015-05-22 MED ORDER — VANCOMYCIN HCL IN DEXTROSE 1-5 GM/200ML-% IV SOLN
1000.0000 mg | Freq: Once | INTRAVENOUS | Status: AC
  Administered 2015-05-22: 1000 mg via INTRAVENOUS
  Filled 2015-05-22: qty 200

## 2015-05-22 MED ORDER — SODIUM CHLORIDE 0.9% FLUSH
3.0000 mL | Freq: Two times a day (BID) | INTRAVENOUS | Status: DC
  Administered 2015-05-23 – 2015-05-24 (×3): 3 mL via INTRAVENOUS

## 2015-05-22 MED ORDER — PHENYLEPHRINE HCL 10 MG/ML IJ SOLN
20.0000 mg | INTRAVENOUS | Status: DC | PRN
  Administered 2015-05-22: 20 ug/min via INTRAVENOUS

## 2015-05-22 MED ORDER — HEPARIN SODIUM (PORCINE) 1000 UNIT/ML IJ SOLN
INTRAMUSCULAR | Status: AC
  Filled 2015-05-22: qty 1

## 2015-05-22 MED ORDER — PHENYLEPHRINE HCL 10 MG/ML IJ SOLN
10.0000 mg | INTRAVENOUS | Status: DC | PRN
  Administered 2015-05-22: 60 ug/min via INTRAVENOUS

## 2015-05-22 MED ORDER — SODIUM CHLORIDE 0.9 % IV SOLN
INTRAVENOUS | Status: DC | PRN
  Administered 2015-05-22 (×3): via INTRAVENOUS

## 2015-05-22 MED ORDER — SODIUM CHLORIDE 0.9 % IV SOLN
INTRAVENOUS | Status: DC
  Filled 2015-05-22: qty 2.5

## 2015-05-22 MED ORDER — LACTATED RINGERS IV SOLN
INTRAVENOUS | Status: DC | PRN
  Administered 2015-05-22: 07:00:00 via INTRAVENOUS

## 2015-05-22 MED ORDER — FENTANYL CITRATE (PF) 250 MCG/5ML IJ SOLN
INTRAMUSCULAR | Status: AC
  Filled 2015-05-22: qty 25

## 2015-05-22 MED ORDER — ARTIFICIAL TEARS OP OINT
TOPICAL_OINTMENT | OPHTHALMIC | Status: DC | PRN
  Administered 2015-05-22: 1 via OPHTHALMIC

## 2015-05-22 MED ORDER — GLYCOPYRROLATE 0.2 MG/ML IJ SOLN
INTRAMUSCULAR | Status: AC
  Filled 2015-05-22: qty 1

## 2015-05-22 MED ORDER — ROCURONIUM BROMIDE 50 MG/5ML IV SOLN
INTRAVENOUS | Status: AC
  Filled 2015-05-22: qty 2

## 2015-05-22 MED ORDER — MAGNESIUM SULFATE 4 GM/100ML IV SOLN
4.0000 g | Freq: Once | INTRAVENOUS | Status: AC
  Administered 2015-05-22: 4 g via INTRAVENOUS
  Filled 2015-05-22: qty 100

## 2015-05-22 MED ORDER — ASPIRIN 81 MG PO CHEW: 324.0000 mg | CHEWABLE_TABLET | Freq: Every day | ORAL | Status: DC

## 2015-05-22 MED ORDER — ACETAMINOPHEN 500 MG PO TABS
1000.0000 mg | ORAL_TABLET | Freq: Four times a day (QID) | ORAL | Status: DC
  Administered 2015-05-23 – 2015-05-25 (×10): 1000 mg via ORAL
  Filled 2015-05-22 (×10): qty 2

## 2015-05-22 MED ORDER — LEVALBUTEROL HCL 0.63 MG/3ML IN NEBU
0.6300 mg | INHALATION_SOLUTION | Freq: Three times a day (TID) | RESPIRATORY_TRACT | Status: DC
  Administered 2015-05-22 – 2015-05-25 (×9): 0.63 mg via RESPIRATORY_TRACT
  Filled 2015-05-22 (×9): qty 3

## 2015-05-22 MED ORDER — SODIUM CHLORIDE 0.9 % IV SOLN: 250.0000 mL | INTRAVENOUS | Status: DC

## 2015-05-22 MED ORDER — DOCUSATE SODIUM 100 MG PO CAPS
200.0000 mg | ORAL_CAPSULE | Freq: Every day | ORAL | Status: DC
  Administered 2015-05-23 – 2015-05-24 (×2): 200 mg via ORAL
  Filled 2015-05-22 (×2): qty 2

## 2015-05-22 MED ORDER — PROTAMINE SULFATE 10 MG/ML IV SOLN
INTRAVENOUS | Status: AC
  Filled 2015-05-22: qty 5

## 2015-05-22 MED ORDER — PROPOFOL 10 MG/ML IV BOLUS
INTRAVENOUS | Status: AC
  Filled 2015-05-22: qty 40

## 2015-05-22 MED ORDER — HEMOSTATIC AGENTS (NO CHARGE) OPTIME
TOPICAL | Status: DC | PRN
  Administered 2015-05-22 (×2): 1 via TOPICAL

## 2015-05-22 MED ORDER — SODIUM CHLORIDE 0.9 % IJ SOLN
INTRAMUSCULAR | Status: AC
  Filled 2015-05-22: qty 10

## 2015-05-22 MED ORDER — NITROGLYCERIN IN D5W 200-5 MCG/ML-% IV SOLN: 0.0000 ug/min | INTRAVENOUS | Status: DC

## 2015-05-22 MED ORDER — CHLORHEXIDINE GLUCONATE 0.12 % MT SOLN
15.0000 mL | OROMUCOSAL | Status: AC
  Administered 2015-05-22: 15 mL via OROMUCOSAL
  Filled 2015-05-22: qty 15

## 2015-05-22 MED ORDER — BISACODYL 10 MG RE SUPP: 10.0000 mg | Freq: Every day | RECTAL | Status: DC

## 2015-05-22 MED ORDER — PHENYLEPHRINE 40 MCG/ML (10ML) SYRINGE FOR IV PUSH (FOR BLOOD PRESSURE SUPPORT)
PREFILLED_SYRINGE | INTRAVENOUS | Status: AC
  Filled 2015-05-22: qty 10

## 2015-05-22 MED ORDER — PHENYLEPHRINE HCL 10 MG/ML IJ SOLN
0.0000 ug/min | INTRAMUSCULAR | Status: DC
  Administered 2015-05-22: 15 ug/min via INTRAVENOUS
  Filled 2015-05-22 (×3): qty 2

## 2015-05-22 MED ORDER — EPHEDRINE SULFATE 50 MG/ML IJ SOLN
INTRAMUSCULAR | Status: AC
  Filled 2015-05-22: qty 2

## 2015-05-22 MED ORDER — ALBUTEROL SULFATE HFA 108 (90 BASE) MCG/ACT IN AERS
INHALATION_SPRAY | RESPIRATORY_TRACT | Status: DC | PRN
  Administered 2015-05-22: 8 via RESPIRATORY_TRACT

## 2015-05-22 MED ORDER — METOPROLOL TARTRATE 12.5 MG HALF TABLET
12.5000 mg | ORAL_TABLET | Freq: Two times a day (BID) | ORAL | Status: DC
  Administered 2015-05-23 (×2): 12.5 mg via ORAL
  Filled 2015-05-22 (×2): qty 1

## 2015-05-22 MED ORDER — LACTATED RINGERS IV SOLN
INTRAVENOUS | Status: DC
Start: 2015-05-22 — End: 2015-05-25

## 2015-05-22 MED ORDER — TRAMADOL HCL 50 MG PO TABS
50.0000 mg | ORAL_TABLET | ORAL | Status: DC | PRN
Start: 2015-05-22 — End: 2015-05-25

## 2015-05-22 MED ORDER — CEFUROXIME SODIUM 1.5 G IJ SOLR
1.5000 g | Freq: Two times a day (BID) | INTRAMUSCULAR | Status: AC
  Administered 2015-05-22 – 2015-05-24 (×4): 1.5 g via INTRAVENOUS
  Filled 2015-05-22 (×4): qty 1.5

## 2015-05-22 MED ORDER — FENTANYL CITRATE (PF) 100 MCG/2ML IJ SOLN
INTRAMUSCULAR | Status: DC | PRN
  Administered 2015-05-22 (×2): 50 ug via INTRAVENOUS
  Administered 2015-05-22 (×2): 100 ug via INTRAVENOUS
  Administered 2015-05-22: 50 ug via INTRAVENOUS
  Administered 2015-05-22: 100 ug via INTRAVENOUS
  Administered 2015-05-22: 25 ug via INTRAVENOUS
  Administered 2015-05-22 (×3): 100 ug via INTRAVENOUS
  Administered 2015-05-22: 150 ug via INTRAVENOUS
  Administered 2015-05-22: 100 ug via INTRAVENOUS
  Administered 2015-05-22: 50 ug via INTRAVENOUS
  Administered 2015-05-22: 25 ug via INTRAVENOUS
  Administered 2015-05-22 (×2): 100 ug via INTRAVENOUS
  Administered 2015-05-22: 200 ug via INTRAVENOUS

## 2015-05-22 MED ORDER — PROTAMINE SULFATE 10 MG/ML IV SOLN
INTRAVENOUS | Status: DC | PRN
Start: 2015-05-22 — End: 2015-05-22
  Administered 2015-05-22: 250 mg via INTRAVENOUS

## 2015-05-22 MED ORDER — SODIUM CHLORIDE 0.45 % IV SOLN
INTRAVENOUS | Status: DC | PRN
  Administered 2015-05-22: 10 mL/h via INTRAVENOUS

## 2015-05-22 MED ORDER — POTASSIUM CHLORIDE 10 MEQ/50ML IV SOLN
10.0000 meq | INTRAVENOUS | Status: AC
  Administered 2015-05-22 (×3): 10 meq via INTRAVENOUS

## 2015-05-22 MED ORDER — PROTAMINE SULFATE 10 MG/ML IV SOLN
INTRAVENOUS | Status: AC
  Filled 2015-05-22: qty 25

## 2015-05-22 MED ORDER — DEXMEDETOMIDINE HCL IN NACL 200 MCG/50ML IV SOLN
INTRAVENOUS | Status: AC
  Filled 2015-05-22: qty 50

## 2015-05-22 MED ORDER — LACTATED RINGERS IV SOLN
INTRAVENOUS | Status: DC
  Administered 2015-05-22: 14:00:00 via INTRAVENOUS

## 2015-05-22 MED ORDER — SODIUM CHLORIDE 0.9 % IJ SOLN
OROMUCOSAL | Status: DC | PRN
  Administered 2015-05-22 (×3): 4 mL via TOPICAL

## 2015-05-22 MED ORDER — SUCCINYLCHOLINE CHLORIDE 20 MG/ML IJ SOLN
INTRAMUSCULAR | Status: AC
  Filled 2015-05-22: qty 1

## 2015-05-22 MED ORDER — MILRINONE IN DEXTROSE 20 MG/100ML IV SOLN
0.1250 ug/kg/min | INTRAVENOUS | Status: AC
  Administered 2015-05-22: .3 ug/kg/min via INTRAVENOUS
  Filled 2015-05-22: qty 100

## 2015-05-22 MED ORDER — ACETAMINOPHEN 160 MG/5ML PO SOLN: 650.0000 mg | Freq: Once | ORAL | Status: AC

## 2015-05-22 MED ORDER — LACTATED RINGERS IV SOLN
INTRAVENOUS | Status: DC | PRN
  Administered 2015-05-22 (×2): via INTRAVENOUS

## 2015-05-22 MED ORDER — PHENYLEPHRINE HCL 10 MG/ML IJ SOLN
INTRAMUSCULAR | Status: DC | PRN
  Administered 2015-05-22: 80 ug via INTRAVENOUS
  Administered 2015-05-22: 160 ug via INTRAVENOUS

## 2015-05-22 MED ORDER — METOPROLOL TARTRATE 25 MG/10 ML ORAL SUSPENSION: 12.5000 mg | Freq: Two times a day (BID) | ORAL | Status: DC

## 2015-05-22 MED ORDER — INSULIN ASPART 100 UNIT/ML ~~LOC~~ SOLN
0.0000 [IU] | SUBCUTANEOUS | Status: DC
  Administered 2015-05-22: 2 [IU] via SUBCUTANEOUS

## 2015-05-22 MED ORDER — METOPROLOL TARTRATE 1 MG/ML IV SOLN
2.5000 mg | INTRAVENOUS | Status: DC | PRN
  Administered 2015-05-23 (×2): 2.5 mg via INTRAVENOUS
  Administered 2015-05-24: 4 mg via INTRAVENOUS
  Filled 2015-05-22 (×3): qty 5

## 2015-05-22 MED ORDER — MORPHINE SULFATE (PF) 2 MG/ML IV SOLN
2.0000 mg | INTRAVENOUS | Status: DC | PRN
Start: 2015-05-22 — End: 2015-05-25
  Administered 2015-05-22 – 2015-05-24 (×10): 2 mg via INTRAVENOUS
  Filled 2015-05-22 (×10): qty 1

## 2015-05-22 MED ORDER — MIDAZOLAM HCL 2 MG/2ML IJ SOLN: 2.0000 mg | INTRAMUSCULAR | Status: DC | PRN

## 2015-05-22 MED ORDER — DOPAMINE-DEXTROSE 3.2-5 MG/ML-% IV SOLN: 0.0000 ug/kg/min | INTRAVENOUS | Status: DC

## 2015-05-22 MED ORDER — BISACODYL 5 MG PO TBEC
10.0000 mg | DELAYED_RELEASE_TABLET | Freq: Every day | ORAL | Status: DC
  Administered 2015-05-23 – 2015-05-24 (×2): 10 mg via ORAL
  Filled 2015-05-22 (×2): qty 2

## 2015-05-22 MED ORDER — ARTIFICIAL TEARS OP OINT
TOPICAL_OINTMENT | OPHTHALMIC | Status: AC
  Filled 2015-05-22: qty 3.5

## 2015-05-22 MED ORDER — INSULIN REGULAR BOLUS VIA INFUSION
0.0000 [IU] | Freq: Three times a day (TID) | INTRAVENOUS | Status: DC
  Filled 2015-05-22: qty 10

## 2015-05-22 MED ORDER — 0.9 % SODIUM CHLORIDE (POUR BTL) OPTIME
TOPICAL | Status: DC | PRN
  Administered 2015-05-22: 1000 mL
  Administered 2015-05-22: 5000 mL

## 2015-05-22 MED ORDER — MIDAZOLAM HCL 10 MG/2ML IJ SOLN
INTRAMUSCULAR | Status: AC
  Filled 2015-05-22: qty 2

## 2015-05-22 MED ORDER — ALBUMIN HUMAN 5 % IV SOLN
250.0000 mL | INTRAVENOUS | Status: AC | PRN
  Administered 2015-05-22 (×2): 250 mL via INTRAVENOUS

## 2015-05-22 MED ORDER — DEXMEDETOMIDINE HCL IN NACL 200 MCG/50ML IV SOLN
0.0000 ug/kg/h | INTRAVENOUS | Status: DC
  Filled 2015-05-22: qty 50

## 2015-05-22 MED ORDER — ROCURONIUM BROMIDE 100 MG/10ML IV SOLN
INTRAVENOUS | Status: DC | PRN
  Administered 2015-05-22: 30 mg via INTRAVENOUS
  Administered 2015-05-22: 50 mg via INTRAVENOUS
  Administered 2015-05-22: 20 mg via INTRAVENOUS
  Administered 2015-05-22: 50 mg via INTRAVENOUS

## 2015-05-22 MED ORDER — ACETAMINOPHEN 650 MG RE SUPP
650.0000 mg | Freq: Once | RECTAL | Status: AC
  Administered 2015-05-22: 650 mg via RECTAL

## 2015-05-22 MED ORDER — PROPOFOL 10 MG/ML IV BOLUS
INTRAVENOUS | Status: DC | PRN
  Administered 2015-05-22: 40 mg via INTRAVENOUS

## 2015-05-22 MED ORDER — ROCURONIUM BROMIDE 50 MG/5ML IV SOLN
INTRAVENOUS | Status: AC
  Filled 2015-05-22: qty 1

## 2015-05-22 MED ORDER — ONDANSETRON HCL 4 MG/2ML IJ SOLN
4.0000 mg | Freq: Four times a day (QID) | INTRAMUSCULAR | Status: DC | PRN
  Administered 2015-05-22 – 2015-05-25 (×3): 4 mg via INTRAVENOUS
  Filled 2015-05-22 (×3): qty 2

## 2015-05-22 MED ORDER — MIDAZOLAM HCL 5 MG/5ML IJ SOLN
INTRAMUSCULAR | Status: DC | PRN
  Administered 2015-05-22: 2 mg via INTRAVENOUS
  Administered 2015-05-22: 1 mg via INTRAVENOUS
  Administered 2015-05-22 (×2): 2 mg via INTRAVENOUS
  Administered 2015-05-22: 1 mg via INTRAVENOUS

## 2015-05-22 MED ORDER — SODIUM CHLORIDE 0.9 % IV SOLN
INTRAVENOUS | Status: DC
  Administered 2015-05-22: 14:00:00 via INTRAVENOUS

## 2015-05-22 MED ORDER — ACETAMINOPHEN 160 MG/5ML PO SOLN: 1000.0000 mg | Freq: Four times a day (QID) | ORAL | Status: DC

## 2015-05-22 MED ORDER — HEPARIN SODIUM (PORCINE) 1000 UNIT/ML IJ SOLN
INTRAMUSCULAR | Status: DC | PRN
  Administered 2015-05-22: 27000 [IU] via INTRAVENOUS

## 2015-05-22 MED ORDER — LACTATED RINGERS IV SOLN: 500.0000 mL | Freq: Once | INTRAVENOUS | Status: DC | PRN

## 2015-05-22 MED ORDER — MILRINONE IN DEXTROSE 20 MG/100ML IV SOLN
0.3000 ug/kg/min | INTRAVENOUS | Status: AC
  Administered 2015-05-22 – 2015-05-24 (×3): 0.3 ug/kg/min via INTRAVENOUS
  Filled 2015-05-22 (×3): qty 100

## 2015-05-22 MED ORDER — OXYCODONE HCL 5 MG PO TABS
5.0000 mg | ORAL_TABLET | ORAL | Status: DC | PRN
Start: 2015-05-22 — End: 2015-05-25
  Administered 2015-05-23 (×3): 10 mg via ORAL
  Administered 2015-05-23: 5 mg via ORAL
  Administered 2015-05-24 – 2015-05-25 (×3): 10 mg via ORAL
  Filled 2015-05-22: qty 1
  Filled 2015-05-22 (×6): qty 2

## 2015-05-22 MED ORDER — MORPHINE SULFATE (PF) 2 MG/ML IV SOLN
1.0000 mg | INTRAVENOUS | Status: DC | PRN
Start: 2015-05-22 — End: 2015-05-23

## 2015-05-22 MED ORDER — FENTANYL CITRATE (PF) 250 MCG/5ML IJ SOLN
INTRAMUSCULAR | Status: AC
  Filled 2015-05-22: qty 5

## 2015-05-22 MED ORDER — PANTOPRAZOLE SODIUM 40 MG PO TBEC
40.0000 mg | DELAYED_RELEASE_TABLET | Freq: Every day | ORAL | Status: DC
  Administered 2015-05-24: 40 mg via ORAL
  Filled 2015-05-22: qty 1

## 2015-05-22 MED ORDER — ASPIRIN EC 325 MG PO TBEC
325.0000 mg | DELAYED_RELEASE_TABLET | Freq: Every day | ORAL | Status: DC
  Administered 2015-05-23 – 2015-05-24 (×2): 325 mg via ORAL
  Filled 2015-05-22 (×2): qty 1

## 2015-05-22 MED FILL — Heparin Sodium (Porcine) Inj 1000 Unit/ML: INTRAMUSCULAR | Qty: 30 | Status: AC

## 2015-05-22 MED FILL — Magnesium Sulfate Inj 50%: INTRAMUSCULAR | Qty: 10 | Status: AC

## 2015-05-22 MED FILL — Potassium Chloride Inj 2 mEq/ML: INTRAVENOUS | Qty: 40 | Status: AC

## 2015-05-22 SURGICAL SUPPLY — 73 items
ADAPTER CARDIO PERF ANTE/RETRO (ADAPTER) ×1 IMPLANT
ADPR PRFSN 84XANTGRD RTRGD (ADAPTER) ×2
ADPR TBG 2 MALE LL ART (MISCELLANEOUS) ×2
BAG DECANTER FOR FLEXI CONT (MISCELLANEOUS) ×3 IMPLANT
BANDAGE ELASTIC 4 VELCRO ST LF (GAUZE/BANDAGES/DRESSINGS) ×3 IMPLANT
BANDAGE ELASTIC 6 VELCRO ST LF (GAUZE/BANDAGES/DRESSINGS) ×3 IMPLANT
BLADE STERNUM SYSTEM 6 (BLADE) ×3 IMPLANT
BNDG GAUZE ELAST 4 BULKY (GAUZE/BANDAGES/DRESSINGS) ×4 IMPLANT
CANISTER SUCTION 2500CC (MISCELLANEOUS) ×3 IMPLANT
CANNULA GUNDRY RCSP 15FR (MISCELLANEOUS) ×1 IMPLANT
CATH CPB KIT GERHARDT (MISCELLANEOUS) ×3 IMPLANT
CATH THORACIC 28FR (CATHETERS) ×3 IMPLANT
CRADLE DONUT ADULT HEAD (MISCELLANEOUS) ×3 IMPLANT
DRAIN CHANNEL 28F RND 3/8 FF (WOUND CARE) ×3 IMPLANT
DRAPE CARDIOVASCULAR INCISE (DRAPES) ×3
DRAPE SLUSH/WARMER DISC (DRAPES) ×3 IMPLANT
DRAPE SRG 135X102X78XABS (DRAPES) ×2 IMPLANT
DRSG AQUACEL AG ADV 3.5X14 (GAUZE/BANDAGES/DRESSINGS) ×3 IMPLANT
ELECT BLADE 4.0 EZ CLEAN MEGAD (MISCELLANEOUS) ×3
ELECT REM PT RETURN 9FT ADLT (ELECTROSURGICAL) ×6
ELECTRODE BLDE 4.0 EZ CLN MEGD (MISCELLANEOUS) ×2 IMPLANT
ELECTRODE REM PT RTRN 9FT ADLT (ELECTROSURGICAL) ×4 IMPLANT
GAUZE SPONGE 4X4 12PLY STRL (GAUZE/BANDAGES/DRESSINGS) ×6 IMPLANT
GLOVE BIO SURGEON STRL SZ 6.5 (GLOVE) ×21 IMPLANT
GLOVE BIO SURGEON STRL SZ7 (GLOVE) ×8 IMPLANT
GLOVE BIO SURGEON STRL SZ7.5 (GLOVE) ×4 IMPLANT
GLOVE BIOGEL PI IND STRL 6 (GLOVE) IMPLANT
GLOVE BIOGEL PI INDICATOR 6 (GLOVE) ×6
GOWN STRL REUS W/ TWL LRG LVL3 (GOWN DISPOSABLE) ×8 IMPLANT
GOWN STRL REUS W/TWL LRG LVL3 (GOWN DISPOSABLE) ×45
HEMOSTAT POWDER SURGIFOAM 1G (HEMOSTASIS) ×9 IMPLANT
HEMOSTAT SURGICEL 2X14 (HEMOSTASIS) ×3 IMPLANT
IV ADAPTER SYR DOUBLE MALE LL (MISCELLANEOUS) ×1 IMPLANT
KIT BASIN OR (CUSTOM PROCEDURE TRAY) ×3 IMPLANT
KIT CATH SUCT 8FR (CATHETERS) ×3 IMPLANT
KIT ROOM TURNOVER OR (KITS) ×3 IMPLANT
KIT SUCTION CATH 14FR (SUCTIONS) ×6 IMPLANT
KIT VASOVIEW W/TROCAR VH 2000 (KITS) ×3 IMPLANT
LEAD PACING MYOCARDI (MISCELLANEOUS) ×3 IMPLANT
MARKER GRAFT CORONARY BYPASS (MISCELLANEOUS) ×9 IMPLANT
NS IRRIG 1000ML POUR BTL (IV SOLUTION) ×15 IMPLANT
PACK OPEN HEART (CUSTOM PROCEDURE TRAY) ×3 IMPLANT
PAD ARMBOARD 7.5X6 YLW CONV (MISCELLANEOUS) ×6 IMPLANT
PAD ELECT DEFIB RADIOL ZOLL (MISCELLANEOUS) ×3 IMPLANT
PENCIL BUTTON HOLSTER BLD 10FT (ELECTRODE) ×3 IMPLANT
PUNCH AORTIC ROT 4.0MM RCL 40 (MISCELLANEOUS) ×1 IMPLANT
SET CARDIOPLEGIA MPS 5001102 (MISCELLANEOUS) ×1 IMPLANT
SPONGE GAUZE 4X4 12PLY STER LF (GAUZE/BANDAGES/DRESSINGS) ×1 IMPLANT
SPONGE LAP 18X18 X RAY DECT (DISPOSABLE) ×2 IMPLANT
STOPCOCK 4 WAY LG BORE MALE ST (IV SETS) ×1 IMPLANT
SURGIFLO W/THROMBIN 8M KIT (HEMOSTASIS) ×1 IMPLANT
SUT BONE WAX W31G (SUTURE) ×3 IMPLANT
SUT PROLENE 3 0 SH1 36 (SUTURE) ×3 IMPLANT
SUT PROLENE 4 0 TF (SUTURE) ×6 IMPLANT
SUT PROLENE 6 0 CC (SUTURE) ×7 IMPLANT
SUT PROLENE 7 0 BV1 MDA (SUTURE) ×4 IMPLANT
SUT STEEL 6MS V (SUTURE) ×3 IMPLANT
SUT STEEL SZ 6 DBL 3X14 BALL (SUTURE) ×3 IMPLANT
SUT VIC AB 1 CTX 18 (SUTURE) ×6 IMPLANT
SUT VIC AB 2-0 CT1 27 (SUTURE) ×3
SUT VIC AB 2-0 CT1 TAPERPNT 27 (SUTURE) IMPLANT
SUT VIC AB 3-0 X1 27 (SUTURE) ×1 IMPLANT
SUTURE E-PAK OPEN HEART (SUTURE) ×3 IMPLANT
SYSTEM SAHARA CHEST DRAIN ATS (WOUND CARE) ×3 IMPLANT
TAPE CLOTH SURG 4X10 WHT LF (GAUZE/BANDAGES/DRESSINGS) ×1 IMPLANT
TAPE PAPER 3X10 WHT MICROPORE (GAUZE/BANDAGES/DRESSINGS) ×1 IMPLANT
TOWEL OR 17X24 6PK STRL BLUE (TOWEL DISPOSABLE) ×6 IMPLANT
TOWEL OR 17X26 10 PK STRL BLUE (TOWEL DISPOSABLE) ×6 IMPLANT
TRAY FOLEY IC TEMP SENS 16FR (CATHETERS) ×3 IMPLANT
TUBING ART PRESS 48 MALE/FEM (TUBING) ×2 IMPLANT
TUBING INSUFFLATION (TUBING) ×3 IMPLANT
UNDERPAD 30X30 INCONTINENT (UNDERPADS AND DIAPERS) ×3 IMPLANT
WATER STERILE IRR 1000ML POUR (IV SOLUTION) ×6 IMPLANT

## 2015-05-22 NOTE — Progress Notes (Signed)
Patient ID: William [name], male   DOB: 1941-03-12, 75 y.o.   MRN: [id number]  SICU Evening Rounds:   Hemodynamically stable  CI = 3.3 on dop 3, milrinone 0.3  Just extubated, alert, no complaints  Urine output good  CT output low  CBC    Component Value Date/Time   WBC 12.7* 05/22/2015 1313   RBC 3.29* 05/22/2015 1313   HGB 9.9* 05/22/2015 1319   HCT 29.0* 05/22/2015 1319   PLT 174 05/22/2015 1313   MCV 90.9 05/22/2015 1313   MCH 30.7 05/22/2015 1313   MCHC 33.8 05/22/2015 1313   RDW 12.9 05/22/2015 1313   LYMPHSABS 1.6 05/19/2015 0224   MONOABS 0.7 05/19/2015 0224   EOSABS 0.7 05/19/2015 0224   BASOSABS 0.0 05/19/2015 0224     BMET    Component Value Date/Time   NA 137 05/22/2015 1319   K 3.8 05/22/2015 1319   CL 100* 05/22/2015 1156   CO2 21* 05/22/2015 0306   GLUCOSE 113* 05/22/2015 1319   BUN 12 05/22/2015 1156   CREATININE 0.70 05/22/2015 1156   CALCIUM 9.0 05/22/2015 0306   GFRNONAA >60 05/22/2015 0306   GFRAA >60 05/22/2015 0306     A/P:  Stable postop course. Continue current plans

## 2015-05-22 NOTE — Procedures (Signed)
Extubation Procedure Note  Patient Details:   Name: DELAINE SAUBER DOB: [birth year] MRN: [id number]   Airway Documentation:     Evaluation  O2 sats: stable throughout Complications: No apparent complications Patient did tolerate procedure well. Bilateral Breath Sounds: Clear   Yes   Pt. Was extubated to a 6L  without any complications, dyspnea or stridor noted. Pt. Achieved 800L on VC & -25 on NIF. Pt. Was instructed on IS x 5, highest goal reached was 1237mL.  Claretta Fraise 05/22/2015, 6:40 PM

## 2015-05-22 NOTE — OR Nursing (Signed)
1st call made @1142  to [facility] on 2S, providing a patient update.   2nd call made @1210  to [facility] on 2S, providing a patient update.  Plans for transport to Room 4 postoperatively. 3rd call made @1224  to [facility] on 2S, providing a patient update (20 minutes away from transport). 4th call made @1250  to Altha Harm, plans for transport to Room 4.

## 2015-05-22 NOTE — Anesthesia Procedure Notes (Signed)
Procedure Name: MAC Date/Time: 05/22/2015 8:00 AM Performed by: Merdis Delay Pre-anesthesia Checklist: Patient identified, Emergency Drugs available, Suction available, Patient being monitored and Timeout performed Patient Re-evaluated:Patient Re-evaluated prior to inductionOxygen Delivery Method: Circle system utilized Preoxygenation: Pre-oxygenation with 100% oxygen Intubation Type: IV induction and Cricoid Pressure applied Ventilation: Two handed mask ventilation required and Oral airway inserted - appropriate to patient size Laryngoscope Size: Mac and 4 Grade View: Grade II Tube type: Oral Tube size: 8.0 mm Number of attempts: 1 Airway Equipment and Method: Stylet Placement Confirmation: ETT inserted through vocal cords under direct vision,  positive ETCO2,  CO2 detector and breath sounds checked- equal and bilateral Secured at: 22 (incisor) cm Tube secured with: Tape Dental Injury: Teeth and Oropharynx as per pre-operative assessment

## 2015-05-22 NOTE — Transfer of Care (Signed)
Immediate Anesthesia Transfer of Care Note  Patient: William [name]  Procedure(s) Performed: Procedure(s): CORONARY ARTERY BYPASS GRAFTING (CABG) LIMA-LAD and SVG-OM EVH RIGHT THIGH GREATER SAPHENOUS VEIN (N/A) TRANSESOPHAGEAL ECHOCARDIOGRAM (TEE) (N/A)  Patient Location: SICU  Anesthesia Type:General  Level of Consciousness: sedated and Patient remains intubated per anesthesia plan  Airway & Oxygen Therapy: Patient remains intubated per anesthesia plan and Patient placed on Ventilator (see vital sign flow sheet for setting)  Post-op Assessment: Report given to RN and Post -op Vital signs reviewed and stable  Post vital signs: Reviewed and stable  Last Vitals:  Filed Vitals:   05/22/15 0300 05/22/15 0400  BP: 91/69 107/91  Pulse: 65 87  Temp:  36.7 C  Resp:      Complications: No apparent anesthesia complications   Pt VSS during transport to ICU. Pt met my ICU RN and RT. Report given to RN/RT and all questions answered. Pt connected to ICU monitors and continued stability confirmed.

## 2015-05-22 NOTE — Brief Op Note (Addendum)
05/19/2015 - 05/22/2015  11:21 AM  PATIENT:  William [name]  75 y.o. male  PRE-OPERATIVE DIAGNOSIS:  CAD LEFT MAIN  POST-OPERATIVE DIAGNOSIS:  CAD LEFT MAIN  PROCEDURE:  Procedure(s): CORONARY ARTERY BYPASS GRAFTING (CABG) (N/A) TRANSESOPHAGEAL ECHOCARDIOGRAM (TEE) (N/A) LIMA-LAD; SVG-OM EVH RIGHT THIGH GREATER SAPHENOUS VEIN  SURGEON:  Surgeon(s) and Role:    * [name], MD - Primary  PHYSICIAN ASSISTANT: WAYNE GOLD PA-C  ANESTHESIA:   general  EBL:  Total I/O In: 500 [I.V.:500] Out: 1300 [Urine:1300]  BLOOD ADMINISTERED:SEE ANEST /PERFUSION RECORDS  DRAINS: ROUTINE   LOCAL MEDICATIONS USED:  NONE  SPECIMEN:  No Specimen  DISPOSITION OF SPECIMEN:  N/A  COUNTS:  YES   DICTATION: .Other Dictation: Dictation Number PENDING  PLAN OF CARE: Admit to inpatient   PATIENT DISPOSITION:  ICU - intubated and hemodynamically stable.   Delay start of Pharmacological VTE agent (>24hrs) due to surgical blood loss or risk of bleeding: yes

## 2015-05-22 NOTE — Progress Notes (Signed)
  Echocardiogram Echocardiogram Transesophageal has been performed.  Diamond Nickel 05/22/2015, 8:38 AM

## 2015-05-23 ENCOUNTER — Encounter (HOSPITAL_COMMUNITY): Payer: Self-pay | Admitting: Cardiothoracic Surgery

## 2015-05-23 ENCOUNTER — Inpatient Hospital Stay (HOSPITAL_COMMUNITY): Payer: Medicare Other

## 2015-05-23 LAB — GLUCOSE, CAPILLARY
Glucose-Capillary: 120 mg/dL — ABNORMAL HIGH (ref 65–99)
Glucose-Capillary: 126 mg/dL — ABNORMAL HIGH (ref 65–99)
Glucose-Capillary: 127 mg/dL — ABNORMAL HIGH (ref 65–99)
Glucose-Capillary: 149 mg/dL — ABNORMAL HIGH (ref 65–99)

## 2015-05-23 LAB — POCT I-STAT, CHEM 8
BUN: 15 mg/dL (ref 6–20)
Calcium, Ion: 1.19 mmol/L (ref 1.13–1.30)
Chloride: 98 mmol/L — ABNORMAL LOW (ref 101–111)
Creatinine, Ser: 1 mg/dL (ref 0.61–1.24)
Glucose, Bld: 158 mg/dL — ABNORMAL HIGH (ref 65–99)
HCT: 28 % — ABNORMAL LOW (ref 39.0–52.0)
Hemoglobin: 9.5 g/dL — ABNORMAL LOW (ref 13.0–17.0)
Potassium: 4.6 mmol/L (ref 3.5–5.1)
Sodium: 133 mmol/L — ABNORMAL LOW (ref 135–145)
TCO2: 23 mmol/L (ref 0–100)

## 2015-05-23 LAB — BASIC METABOLIC PANEL
Anion gap: 7 (ref 5–15)
BUN: 10 mg/dL (ref 6–20)
CO2: 23 mmol/L (ref 22–32)
Calcium: 8 mg/dL — ABNORMAL LOW (ref 8.9–10.3)
Chloride: 105 mmol/L (ref 101–111)
Creatinine, Ser: 0.87 mg/dL (ref 0.61–1.24)
GFR calc Af Amer: >60 mL/min (ref >60)
GFR calc non Af Amer: >60 mL/min (ref >60)
Glucose, Bld: 129 mg/dL — ABNORMAL HIGH (ref 65–99)
Potassium: 4.5 mmol/L (ref 3.5–5.1)
Sodium: 135 mmol/L (ref 135–145)

## 2015-05-23 LAB — CBC
HCT: 30 % — ABNORMAL LOW (ref 39.0–52.0)
HCT: 29.1 % — ABNORMAL LOW (ref 39.0–52.0)
Hemoglobin: 9.9 g/dL — ABNORMAL LOW (ref 13.0–17.0)
Hemoglobin: 9.5 g/dL — ABNORMAL LOW (ref 13.0–17.0)
MCH: 30.1 pg (ref 26.0–34.0)
MCH: 30 pg (ref 26.0–34.0)
MCHC: 33 g/dL (ref 30.0–36.0)
MCHC: 32.6 g/dL (ref 30.0–36.0)
MCV: 91.2 fL (ref 78.0–100.0)
MCV: 91.8 fL (ref 78.0–100.0)
Platelets: 234 10*3/uL (ref 150–400)
Platelets: 223 10*3/uL (ref 150–400)
RBC: 3.29 MIL/uL — ABNORMAL LOW (ref 4.22–5.81)
RBC: 3.17 MIL/uL — ABNORMAL LOW (ref 4.22–5.81)
RDW: 13 % (ref 11.5–15.5)
RDW: 13.2 % (ref 11.5–15.5)
WBC: 12.7 10*3/uL — ABNORMAL HIGH (ref 4.0–10.5)
WBC: 11.7 10*3/uL — ABNORMAL HIGH (ref 4.0–10.5)

## 2015-05-23 LAB — MAGNESIUM
Magnesium: 2.2 mg/dL (ref 1.7–2.4)
Magnesium: 2.1 mg/dL (ref 1.7–2.4)

## 2015-05-23 LAB — CREATININE, SERUM
Creatinine, Ser: 1.01 mg/dL (ref 0.61–1.24)
GFR calc Af Amer: >60 mL/min (ref >60)
GFR calc non Af Amer: >60 mL/min (ref >60)

## 2015-05-23 MED ORDER — VITAMIN B-1 100 MG PO TABS
100.0000 mg | ORAL_TABLET | Freq: Every day | ORAL | Status: DC
  Administered 2015-05-23 – 2015-05-29 (×7): 100 mg via ORAL
  Filled 2015-05-23 (×7): qty 1

## 2015-05-23 MED ORDER — INSULIN ASPART 100 UNIT/ML ~~LOC~~ SOLN: 0.0000 [IU] | SUBCUTANEOUS | Status: DC

## 2015-05-23 MED ORDER — ENOXAPARIN SODIUM 30 MG/0.3ML ~~LOC~~ SOLN
30.0000 mg | Freq: Every day | SUBCUTANEOUS | Status: DC
  Administered 2015-05-23 – 2015-05-24 (×2): 30 mg via SUBCUTANEOUS
  Filled 2015-05-23 (×2): qty 0.3

## 2015-05-23 MED ORDER — INSULIN ASPART 100 UNIT/ML ~~LOC~~ SOLN
0.0000 [IU] | SUBCUTANEOUS | Status: DC
  Administered 2015-05-23 – 2015-05-24 (×5): 2 [IU] via SUBCUTANEOUS

## 2015-05-23 MED ORDER — GABAPENTIN 600 MG PO TABS
600.0000 mg | ORAL_TABLET | Freq: Every day | ORAL | Status: DC
  Administered 2015-05-23 – 2015-05-28 (×6): 600 mg via ORAL
  Filled 2015-05-23 (×6): qty 1

## 2015-05-23 MED ORDER — ENOXAPARIN SODIUM 30 MG/0.3ML ~~LOC~~ SOLN: 30.0000 mg | Freq: Every day | SUBCUTANEOUS | Status: DC

## 2015-05-23 MED ORDER — ROSUVASTATIN CALCIUM 10 MG PO TABS
10.0000 mg | ORAL_TABLET | Freq: Every day | ORAL | Status: DC
  Administered 2015-05-23 – 2015-05-28 (×6): 10 mg via ORAL
  Filled 2015-05-23 (×8): qty 1

## 2015-05-23 NOTE — Progress Notes (Signed)
Pt found on room air sat 86-88%. RT placed pt back on  2 Lpm after neb treatment

## 2015-05-23 NOTE — Anesthesia Postprocedure Evaluation (Signed)
Anesthesia Post Note  Patient: MARQUE GUILMETTE  Procedure(s) Performed: Procedure(s) (LRB): CORONARY ARTERY BYPASS GRAFTING (CABG) LIMA-LAD and SVG-OM EVH RIGHT THIGH GREATER SAPHENOUS VEIN (N/A) TRANSESOPHAGEAL ECHOCARDIOGRAM (TEE) (N/A)  Patient location during evaluation: SICU Anesthesia Type: General Level of consciousness: awake, awake and alert and oriented Pain management: pain level controlled Vital Signs Assessment: post-procedure vital signs reviewed and stable Respiratory status: spontaneous breathing, nonlabored ventilation and respiratory function stable Cardiovascular status: blood pressure returned to baseline Anesthetic complications: no    Last Vitals:  Filed Vitals:   05/23/15 2100 05/23/15 2200  BP: 103/63 125/65  Pulse: 119 121  Temp:    Resp: 18 21    Last Pain:  Filed Vitals:   05/23/15 2236  PainSc: 5                  Lenola Lockner COKER

## 2015-05-23 NOTE — Progress Notes (Addendum)
Patient ID: William [name], male   DOB: [birth year], 75 y.o.   MRN: [id number] EVENING ROUNDS NOTE :     [address]       [place] [address]             [phone]                 1 Day Post-Op Procedure(s) (LRB): CORONARY ARTERY BYPASS GRAFTING (CABG) LIMA-LAD and SVG-OM EVH RIGHT THIGH GREATER SAPHENOUS VEIN (N/A) TRANSESOPHAGEAL ECHOCARDIOGRAM (TEE) (N/A)  Total Length of Stay:  LOS: 4 days  BP 143/78 mmHg  Pulse 112  Temp(Src) 98 F (36.7 C) (Oral)  Resp 18  Ht 5\' 6"  (1.676 m)  Wt 165 lb 12.6 oz (75.2 kg)  BMI 26.77 kg/m2  SpO2 95%  .Intake/Output      02/21 0701 - 02/22 0700 02/22 0701 - 02/23 0700   P.O. 400    I.V. (mL/kg) 5045.7 (67.1) 327.2 (4.4)   Blood 510    IV Piggyback 1000 50   Total Intake(mL/kg) 6955.7 (92.5) 377.2 (5)   Urine (mL/kg/hr) 3825 (2.1) 420 (0.5)   Emesis/NG output 0 (0)    Blood 1520 (0.8)    Chest Tube 350 (0.2) 270 (0.3)   Total Output 5695 690   Net +1260.7 -312.8          . sodium chloride 20 mL/hr at 05/22/15 1400  . sodium chloride    . sodium chloride 20 mL/hr at 05/22/15 1400  . insulin (NOVOLIN-R) infusion Stopped (05/22/15 1730)  . lactated ringers 20 mL/hr at 05/23/15 1800  . lactated ringers 20 mL/hr at 05/22/15 1400  . milrinone 0.3 mcg/kg/min (05/23/15 1800)  . nitroGLYCERIN Stopped (05/22/15 1300)  . phenylephrine (NEO-SYNEPHRINE) Adult infusion Stopped (05/23/15 0600)     Lab Results  Component Value Date   WBC 11.7* 05/23/2015   HGB 9.5* 05/23/2015   HCT 29.1* 05/23/2015   PLT 223 05/23/2015   GLUCOSE 158* 05/23/2015   CHOL 137 05/19/2015   TRIG 74 05/19/2015   HDL 33* 05/19/2015   LDLCALC 89 05/19/2015   ALT 23 05/19/2015   AST 103* 05/19/2015   NA 133* 05/23/2015   K 4.6 05/23/2015   CL 98* 05/23/2015   CREATININE 1.01 05/23/2015   BUN 15 05/23/2015   CO2 23 05/23/2015   TSH 2.894 05/19/2015   INR 1.74* 05/22/2015   HGBA1C 6.0* 05/21/2015   Walked in unit today Stable     [name] MD  Beeper [phone] Office [phone] 05/23/2015 6:40 PM

## 2015-05-23 NOTE — CV Procedure (Signed)
Intra-operative Transesophageal Echocardiography Report:  William [name] is a 75 year old male with a history of hypertension, hyperlipidemia, peripheral vascular disease and a previous history of smoking who presented to [facility] on 2/18 with chest pain. The Troponins peaked at 25 ng/ml and he was determined to have suffered a non-STEMI. Cardiac catheterization revealed 99% ostial left main stenosis and proximal 95% LAD and mid 95% LAD stenosis.  Distal anterior wall, anterior septal, and apical akinesis were noted on echocardiography. He is now scheduled to undergo coronary artery bypass grafting by Dr. Servando Snare. Intra-operative transesophageal echocardiography was indicated to evaluate the left and right ventricular function, to serve as a monitor for intraoperative  volume status and intracardiac air, and to assess for any valvular pathology.  The patient was brought to the operating room at [facility] and general anesthesia was induced without difficulty. Following endotracheal intubation and orogastric suctioning, the transesophageal echocardiography probe was inserted in the esophagus without difficulty.  Impression: Pre-bypass findings:  1. Aortic valve: The aortic valve was trileaflet. The leaflets opened normally without restriction. There was 1+ aortic insufficiency. The leaflets were mildly thickened.  2. Mitral valve: The mitral leaflets opened without restriction. There was a reduced zone of leaflet coaptation  due to left ventricular dilatation.This resulted in a central jet of mitral regurgitation. The degree of mitral regurgitation was highly variable depending on upon the hemodynamic state. When the left ventricle was dilated, there was marked mitral regurgitation which was graded as 3+. However when his blood pressure was lower and the left ventricle was reduced in size, the mitral regurgitation was graded as 1-2+. There were no prolapsing or flail leaflet segments  noted.  3. Left ventricle: The mid-to distal anterior wall, apex, and anterior septum were akinetic but with normal wall thickness. The remainder of the left ventricular segments appear to contract adequately. The ejection fraction was estimated at 40-45%. There was no thrombus noted in the apex.  4. Right ventricle: The right ventricular cavity was mildly enlarged but there appeared to be adequate contractility the right ventricular free wall and adequate tricuspid annular plane systolic excursion. This was consistent with normal right ventricle systolic function.  5. Tricuspid valve: The tricuspid valve appeared structurally normal. There was trace to 1+ tricuspid insufficiency.  6. Interatrial septum: The interatrial septum was intact without evidence of patent foramen ovale or atrial septal defect by color Doppler.  7. Left atrium: There was no thrombus noted within the left atrial cavity or left atrial appendage.  Post-bypass findings:  1. Aortic valve: The aortic valve appeared unchanged from the pre-bypass study. There was 1+ aortic insufficiency. The leaflets opened normally without restriction.  2. Mitral valve: There was 1-2+ mitral insufficiency in the post-bypass period. There appeared to be improved coaptation of the mitral leaflets due to reduced left ventricular volume. There were no prolapsing or flail leaflet segments noted.  3. Left ventricle: There was persistent akinesis of the distal anterior wall, apex, and anterior septum which was unchanged from the pre-bypass study. There was adequate contractility of the remaining myocardial segments and the ejection fraction was estimated at 45-50%.  4. Right ventricle: There appeared to be normal right ventricular systolic function.  5. Tricuspid valve: The tricuspid valve appeared structurally unremarkable with 1+ tricuspid insufficiency.  Roberts Gaudy, M.D.

## 2015-05-23 NOTE — Progress Notes (Signed)
Anesthesiology Follow-up:  Awake and alert, neuro intact, breathing unlabored, in good spirits. Hemodynamically stable on dopamine 3 mcg/kg/min and milrinone 0.3 mcg/kg/min for inotropic support.  VS: T- 37 BP- 117/44 HR- 110 (SR) RR 16 O2 sat 98% on 4L PA 34/19   K-4.5 BUN/Cr. 10/0.87 glucose 126 H/H- 9.9/30.0 platelets- 234,000  Extubated 5 1/2 hours post-op  75 year male 1 day S/P CABG following admission for MI 2/18. Stable post-op course as yet.  William [name]

## 2015-05-23 NOTE — Care Management Note (Signed)
Case Management Note  Patient Details  Name: William [name] MRN: [id number] Date of Birth: 1941-03-02  Subjective/Objective:    Per pt, he has cane and walker @ home although he currently uses neither one.  States his daughter has already arranged to take FMLA to provide 24/7 assistance when he is stable for discharge.                         Expected Discharge Plan:  Home/Self Care  Discharge planning Services  CM Consult  Status of Service:  In process, will continue to follow  Medicare Important Message Given:  Yes  Girard Cooter, RN 05/23/2015, 2:02 PM

## 2015-05-23 NOTE — Op Note (Signed)
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  SURGICAL PROCEDURE:  Coronary artery bypass grafting x2 with the left internal mammary to the left anterior descending coronary artery, and reverse saphenous vein graft to the first obtuse marginal coronary artery with right thigh endo vein greater saphenous harvesting.  SURGEON:  Lanelle Bal, MD  FIRST ASSISTANT:  John Giovanni, P.A.-C.  BRIEF HISTORY:  The patient is a 75 year old male, who presents with acute myocardial infarction on February 17th.  Troponins elevated to 25. The patient was treated medically initially and then underwent cardiac catheterization by Dr. Gwenlyn Found on the afternoon of February 20th.  Limited shots were done, but the patient had a significant calcified eccentric plaque in the left main and a 90% proximal LAD stenosis.  The right coronary artery had luminal irregularities, but no high-grade stenosis. The ostium of the right coronary artery appeared patent.  The patient had known up to a 70-mm gradient difference between lower blood pressure in the left arm than the right arm.  No injections of the arches, subclavian were performed.  Overall, ventricular function was mildly depressed with anterior hypokinesis on echocardiogram.  Because of the patient's critical anatomy, urgent coronary artery bypass grafting was recommended.  The patient agreed and signed informed consent.  He has been followed by vascular surgeons both for peripheral vascular and cerebral  vascular disease, having had stents placed in his right iliac artery in the past.  He has no distal pedal pulses.  Preop Dopplers confirmed lower blood pressure in the left arm than the right.  The patient agreed to proceed with surgery and signed informed consent.  DESCRIPTION OF PROCEDURE:  With Swan-Ganz and arterial line monitor in place with A-line in the right side, the patient underwent general endotracheal anesthesia without incident and he remained hemodynamically stable.  TEE probe was passed and demonstrated very trace aortic insufficiency, anterior apical hypokinesis.  After appropriate time-out was performed, we proceeded with endo vein harvesting of segment of vein from the right thigh endoscopically, removing the thigh segment of the right greater saphenous vein.  The median sternotomy was performed. Knowing that the patient had a difference in the blood pressure in left arm, we proceeded with dissecting down the left internal mammary artery. The artery itself was of good quality.  The distal vessel was divided and had good free flow measured at 55 mL/minute.  To resolve whether to use the mammary artery as a free graft or leave it as a pedicle, we also after hydrostatically dilating the vessel with heparinized saline took a small catheter and measured simultaneous blood pressures in the left internal mammary and the right radial artery and consistently there was no more than a 10-mm gradient.  With excellent flow  in the mammary and little blood pressure gradient, we decided to use it as a pedicle graft. The pericardium was opened.  On examination of the heart, it was obvious that the patient had not had a subendocardial myocardial infarction, but a transmural myocardial infarction involving the anteroapical segments of the left ventricle.  The patient was systemically heparinized.  The ascending aorta was cannulated.  The right atrium was cannulated. Retrograde  cardioplegia catheter was placed.  The patient was placed on cardiopulmonary bypass 2.4 L/min/m2.  The heart was elevated.  The first obtuse marginal and dominant supplier of the lateral wall was located and an intramyocardial position was dissected out and it was a good- sized vessel.  The distal circ branches as they came from the AV groove were dissected out, but decided they were too small for bypass being less than 1 mm.  The LAD was an excellent quality vessel and midportion of the vessel was identified and dissected out of the epicardium.  The patient's body temperature was cooled to 32 degrees.  Aortic crossclamp was applied.  Myocardial septal temperature was monitored throughout crossclamp.  500 mL of cold blood potassium cardioplegia was administered antegrade.  Additional cardioplegia was administered retrograde with diastolic arrest of the heart.  The heart was elevated and the first obtuse marginal vessel was opened and admitted a 1.5-mm probe distally using a running 7-0 Prolene.  Distal anastomosis was performed with a segment of reverse saphenous vein graft.  We then turned our attention to the left anterior descending coronary artery, which was opened in the midportion.  The LAD was trimmed to the appropriate length.  Again noted the free flow was excellent.  We used a running 8-0 Prolene and anastomosed the left internal mammary artery to the left anterior descending coronary artery.  With release of the bulldog on the mammary artery, there was prompt rise in myocardial septal temperature.  Bulldog was placed back on the mammary artery. Additional cold blood cardioplegia was administered.  With the crossclamp still in place, single punch aortotomy was performed and the vein graft was anastomosed to the ascending aorta.  Air was allowed to passively fill and de-aired the aorta and the aortic crossclamp.  The bulldog was removed from the mammary artery with prompt rise  in myocardial septal temperature.  The crossclamp was then removed with total crossclamp time was 53 minutes.  The patient required electric defibrillation to return to a sinus rhythm.  Atrial and ventricular pacing wires were applied.  The retrograde cardioplegia catheter was removed.  The patient was started on low-dose dopamine and milrinone infusion.  He was then ventilated.  With his body temperature rewarmed to 37 degrees, he was then ventilated and weaned from cardiopulmonary bypass without difficulty.  He remained hemodynamically stable, decannulated in the usual fashion.  Protamine sulfate was administered with operative field hemostatic.  Atrial and ventricular pacing wires were applied.  Graft marker was applied.  Pericardium was loosely reapproximated.  Left pleural tube and a Blake mediastinal drain were left in place.  Sternum was closed with #6 stainless steel wire.  Fascia was closed with interrupted 0 Vicryl, running 3-0 Vicryl in subcutaneous tissue, 4-0 subcuticular stitch in skin edges.  Dry dressings were applied.  Sponge and needle count was reported as correct at completion of procedure.  The patient tolerated the procedure without obvious complication.     Lanelle Bal, MD     EG/MEDQ  D:  05/23/2015  T:  05/23/2015  Job:  [id number] 

## 2015-05-23 NOTE — Progress Notes (Signed)
Patient ID: HUBBERT DEGRAW, male   DOB: [birth year], 75 y.o.   MRN: [id number] TCTS DAILY ICU PROGRESS NOTE                   [address]            [place] [address]          [phone]   1 Day Post-Op Procedure(s) (LRB): CORONARY ARTERY BYPASS GRAFTING (CABG) LIMA-LAD and SVG-OM EVH RIGHT THIGH GREATER SAPHENOUS VEIN (N/A) TRANSESOPHAGEAL ECHOCARDIOGRAM (TEE) (N/A)  Total Length of Stay:  LOS: 4 days   Subjective: Awake and alert, extubated   Objective: Vital signs in last 24 hours: Temp:  [96.8 F (36 C)-99.3 F (37.4 C)] 98.6 F (37 C) (02/22 0700) Pulse Rate:  [89-110] 110 (02/22 0715) Cardiac Rhythm:  [-] Sinus tachycardia (02/22 0729) Resp:  [10-23] 16 (02/22 0715) BP: (90-147)/(43-78) 109/63 mmHg (02/22 0715) SpO2:  [91 %-99 %] 93 % (02/22 0725) Arterial Line BP: (83-163)/(41-152) 117/44 mmHg (02/22 0700) FiO2 (%):  [40 %-70 %] 40 % (02/21 1755) Weight:  [165 lb 12.6 oz (75.2 kg)] 165 lb 12.6 oz (75.2 kg) (02/22 0545)  Filed Weights   05/21/15 0432 05/22/15 0457 05/23/15 0545  Weight: 162 lb 0.6 oz (73.5 kg) 160 lb 14.4 oz (72.984 kg) 165 lb 12.6 oz (75.2 kg)    Weight change: 4 lb 14.2 oz (2.216 kg)   Hemodynamic parameters for last 24 hours: PAP: (27-49)/(13-31) 34/19 mmHg CO:  [5.5 L/min-6.6 L/min] 6.6 L/min CI:  [3 L/min/m2-3.6 L/min/m2] 3.6 L/min/m2  Intake/Output from previous day: 02/21 0701 - 02/22 0700 In: 6955.7 [P.O.:400; I.V.:5045.7; Blood:510; IV [id number] Out: [id number] [Urine:3825; Blood:1520; Chest Tube:350]  Intake/Output this shift:    Current Meds: Scheduled Meds: . acetaminophen  1,000 mg Oral 4 times per day   Or  . acetaminophen (TYLENOL) oral liquid 160 mg/5 mL  1,000 mg Per Tube 4 times per day  . aspirin EC  325 mg Oral Daily   Or  . aspirin  324 mg Per Tube Daily  . bisacodyl  10 mg Oral Daily   Or  . bisacodyl  10 mg Rectal Daily  . cefUROXime (ZINACEF)  IV  1.5 g Intravenous Q12H  . docusate sodium   200 mg Oral Daily  . insulin aspart  0-24 Units Subcutaneous 6 times per day  . insulin regular  0-10 Units Intravenous TID WC  . levalbuterol  0.63 mg Nebulization TID  . metoprolol tartrate  12.5 mg Oral BID   Or  . metoprolol tartrate  12.5 mg Per Tube BID  . [START ON 05/24/2015] pantoprazole  40 mg Oral Daily  . sodium chloride flush  3 mL Intravenous Q12H   Continuous Infusions: . sodium chloride 20 mL/hr at 05/22/15 1400  . sodium chloride    . sodium chloride 20 mL/hr at 05/22/15 1400  . dexmedetomidine Stopped (05/23/15 0400)  . DOPamine 3 mcg/kg/min (05/23/15 0700)  . insulin (NOVOLIN-R) infusion Stopped (05/22/15 1730)  . lactated ringers 20 mL/hr at 05/22/15 1400  . lactated ringers 20 mL/hr at 05/22/15 1400  . milrinone 0.3 mcg/kg/min (05/23/15 0700)  . nitroGLYCERIN Stopped (05/22/15 1300)  . phenylephrine (NEO-SYNEPHRINE) Adult infusion Stopped (05/23/15 0600)   PRN Meds:.sodium chloride, albumin human, lactated ringers, metoprolol, midazolam, morphine injection, ondansetron (ZOFRAN) IV, oxyCODONE, sodium chloride flush, traMADol  General appearance: alert, cooperative and appears older than stated age Neurologic: intact Heart: regular rate and rhythm, S1, S2 normal,  no murmur, click, rub or gallop Lungs: diminished breath sounds bibasilar Abdomen: soft, non-tender; bowel sounds normal; no masses,  no organomegaly Extremities: extremities normal, atraumatic, no cyanosis or edema and Homans sign is negative, no sign of DVT Wound: sternum stable, silver dressing intact  Lab Results: CBC: Recent Labs  05/22/15 1916 05/23/15 0416  WBC 13.5* 12.7*  HGB 10.5* 9.9*  HCT 31.7* 30.0*  PLT 221 234   BMET:  Recent Labs  05/22/15 0306  05/22/15 1913 05/22/15 1916 05/23/15 0416  NA 134*  < > 136  --  135  K 4.0  < > 4.5  --  4.5  CL 103  < > 104  --  105  CO2 21*  --   --   --  23  GLUCOSE 102*  < > 128*  --  129*  BUN 15  < > 11  --  10  CREATININE 0.92  < >  0.80 0.86 0.87  CALCIUM 9.0  --   --   --  8.0*  < > = values in this interval not displayed.  PT/INR:  Recent Labs  05/22/15 1313  LABPROT 20.3*  INR 1.74*   Radiology: Dg Chest Port 1 View  05/23/2015  CLINICAL DATA:  75 year old male status post CABG. Initial encounter. EXAM: PORTABLE CHEST 1 VIEW COMPARISON:  05/22/2015 and earlier. FINDINGS: Portable AP semi upright view at 0610 hours. Extubated and enteric tube removed. Stable lung volumes. Stable right IJ Swan-Ganz catheter, tip remains at the level of the right main pulmonary artery. Stable left chest tube. Mediastinal tube also faintly visible. Stable cardiac size and mediastinal contours. No pneumothorax or pulmonary edema. Left greater than right pleural effusions and lung base atelectasis. Mildly increased gaseous distension of the stomach. IMPRESSION: 1. Extubated and enteric tube removed. Otherwise, stable lines and tubes. 2. No pneumothorax or pulmonary edema. Left greater than right pleural effusions and atelectasis. Electronically Signed   By: Genevie Ann M.D.   On: 05/23/2015 07:35   Dg Chest Port 1 View  05/22/2015  CLINICAL DATA:  Post CABG EXAM: PORTABLE CHEST 1 VIEW COMPARISON:  05/19/2015 FINDINGS: Borderline cardiomegaly. Status post CABG. Endotracheal tube in place with tip 3.6 cm above the carina. NG tube in place. There is right IJ Swan-Ganz catheter with the tip in the region of main pulmonary artery. No pneumothorax. Left side chest tubes are noted. There is small right pleural effusion right basilar atelectasis. No pulmonary edema. IMPRESSION: Status post CABG. Right IJ Swan-Ganz catheter in place. Endotracheal and NG tube in place. Left side chest tubes. No pneumothorax. Small right pleural effusion with right basilar atelectasis. No pulmonary edema. Electronically Signed   By: Lahoma Crocker M.D.   On: 05/22/2015 13:27     Assessment/Plan: S/P Procedure(s) (LRB): CORONARY ARTERY BYPASS GRAFTING (CABG) LIMA-LAD and SVG-OM  EVH RIGHT THIGH GREATER SAPHENOUS VEIN (N/A) TRANSESOPHAGEAL ECHOCARDIOGRAM (TEE) (N/A) Mobilize Diuresis Diabetes control d/c tubes/lines See progression orders Expected Acute  Blood - loss Anemia At time of surgery, appears transmural MI at time of admission not subendocardial see op note  Expected Acute  Blood - loss Anemia  Will need ACE as tolerated - cr normal  Wean off dopamine , continue low dose milrinone for now  Was on low dose Crestor pre op, noted Lipitor made legs hurt, but does have claudication which may confound symptoms of statin intolerance  [name] 05/23/2015 7:50 AM

## 2015-05-23 NOTE — Care Management Important Message (Signed)
Important Message  Patient Details  Name: William [name] MRN: [id number] Date of Birth: [birth year]   Medicare Important Message Given:  Yes    Nathen May 05/23/2015, 12:07 PM

## 2015-05-24 ENCOUNTER — Inpatient Hospital Stay (HOSPITAL_COMMUNITY): Payer: Medicare Other

## 2015-05-24 LAB — GLUCOSE, CAPILLARY
Glucose-Capillary: 124 mg/dL — ABNORMAL HIGH (ref 65–99)
Glucose-Capillary: 98 mg/dL (ref 65–99)
Glucose-Capillary: 116 mg/dL — ABNORMAL HIGH (ref 65–99)
Glucose-Capillary: 130 mg/dL — ABNORMAL HIGH (ref 65–99)
Glucose-Capillary: 119 mg/dL — ABNORMAL HIGH (ref 65–99)

## 2015-05-24 LAB — CBC
HCT: 28.6 % — ABNORMAL LOW (ref 39.0–52.0)
Hemoglobin: 9.3 g/dL — ABNORMAL LOW (ref 13.0–17.0)
MCH: 29.9 pg (ref 26.0–34.0)
MCHC: 32.5 g/dL (ref 30.0–36.0)
MCV: 92 fL (ref 78.0–100.0)
Platelets: 245 10*3/uL (ref 150–400)
RBC: 3.11 MIL/uL — ABNORMAL LOW (ref 4.22–5.81)
RDW: 13.3 % (ref 11.5–15.5)
WBC: 13.4 10*3/uL — ABNORMAL HIGH (ref 4.0–10.5)

## 2015-05-24 LAB — BASIC METABOLIC PANEL
Anion gap: 8 (ref 5–15)
BUN: 16 mg/dL (ref 6–20)
CO2: 25 mmol/L (ref 22–32)
Calcium: 8.1 mg/dL — ABNORMAL LOW (ref 8.9–10.3)
Chloride: 98 mmol/L — ABNORMAL LOW (ref 101–111)
Creatinine, Ser: 1.09 mg/dL (ref 0.61–1.24)
GFR calc Af Amer: >60 mL/min (ref >60)
GFR calc non Af Amer: >60 mL/min (ref >60)
Glucose, Bld: 110 mg/dL — ABNORMAL HIGH (ref 65–99)
Potassium: 4.9 mmol/L (ref 3.5–5.1)
Sodium: 131 mmol/L — ABNORMAL LOW (ref 135–145)

## 2015-05-24 MED ORDER — METOPROLOL TARTRATE 25 MG PO TABS
25.0000 mg | ORAL_TABLET | Freq: Two times a day (BID) | ORAL | Status: DC
  Administered 2015-05-24 (×2): 25 mg via ORAL
  Filled 2015-05-24 (×2): qty 1

## 2015-05-24 MED ORDER — METOPROLOL TARTRATE 25 MG/10 ML ORAL SUSPENSION: 25.0000 mg | Freq: Two times a day (BID) | ORAL | Status: DC

## 2015-05-24 MED ORDER — FUROSEMIDE 10 MG/ML IJ SOLN
40.0000 mg | Freq: Once | INTRAMUSCULAR | Status: AC
  Administered 2015-05-24: 40 mg via INTRAVENOUS
  Filled 2015-05-24: qty 4

## 2015-05-24 NOTE — Progress Notes (Signed)
Patient ID: William [name], male   DOB: 1940-06-27, 75 y.o.   MRN: [id number]  SICU Evening Rounds:  Hemodynamically stable  Walked twice today  Urine output good.  Continue current plans

## 2015-05-24 NOTE — Progress Notes (Signed)
Right IJ introducer dressing changed by Norberta Keens, RN and Paris Lore, RN.

## 2015-05-24 NOTE — Progress Notes (Signed)
Patient ID: William [name], male   DOB: 03/25/1941, 75 y.o.   MRN: [id number] TCTS DAILY ICU PROGRESS NOTE                   [address]            [place] [address]          [phone]   2 Days Post-Op Procedure(s) (LRB): CORONARY ARTERY BYPASS GRAFTING (CABG) LIMA-LAD and SVG-OM EVH RIGHT THIGH GREATER SAPHENOUS VEIN (N/A) TRANSESOPHAGEAL ECHOCARDIOGRAM (TEE) (N/A)  Total Length of Stay:  LOS: 5 days   Subjective: Awake and alert, walked in unit 100 feet  Objective: Vital signs in last 24 hours: Temp:  [97.5 F (36.4 C)-99.4 F (37.4 C)] 98.8 F (37.1 C) (02/23 0300) Pulse Rate:  [102-135] 125 (02/23 0800) Cardiac Rhythm:  [-] Sinus tachycardia (02/23 0800) Resp:  [0-35] 15 (02/23 0800) BP: (99-144)/(58-95) 120/68 mmHg (02/23 0800) SpO2:  [84 %-100 %] 97 % (02/23 0800) Arterial Line BP: (111-163)/(38-54) 139/45 mmHg (02/22 1630) Weight:  [172 lb 9.9 oz (78.3 kg)] 172 lb 9.9 oz (78.3 kg) (02/23 0600)  Filed Weights   05/22/15 0457 05/23/15 0545 05/24/15 0600  Weight: 160 lb 14.4 oz (72.984 kg) 165 lb 12.6 oz (75.2 kg) 172 lb 9.9 oz (78.3 kg)    Weight change: 6 lb 13.4 oz (3.1 kg)   Hemodynamic parameters for last 24 hours:    Intake/Output from previous day: 02/22 0701 - 02/23 0700 In: 773 [I.V.:673; IV Piggyback:100] Out: 1305 [Urine:825; Chest Tube:480]  Intake/Output this shift: Total I/O In: 76.6 [I.V.:26.6; IV Piggyback:50] Out: -   Current Meds: Scheduled Meds: . acetaminophen  1,000 mg Oral 4 times per day   Or  . acetaminophen (TYLENOL) oral liquid 160 mg/5 mL  1,000 mg Per Tube 4 times per day  . aspirin EC  325 mg Oral Daily   Or  . aspirin  324 mg Per Tube Daily  . bisacodyl  10 mg Oral Daily   Or  . bisacodyl  10 mg Rectal Daily  . docusate sodium  200 mg Oral Daily  . enoxaparin (LOVENOX) injection  30 mg Subcutaneous QHS  . gabapentin  600 mg Oral QHS  . insulin aspart  0-24 Units Subcutaneous 6 times per day  .  insulin regular  0-10 Units Intravenous TID WC  . levalbuterol  0.63 mg Nebulization TID  . metoprolol tartrate  12.5 mg Oral BID   Or  . metoprolol tartrate  12.5 mg Per Tube BID  . pantoprazole  40 mg Oral Daily  . rosuvastatin  10 mg Oral q1800  . sodium chloride flush  3 mL Intravenous Q12H  . thiamine  100 mg Oral Daily   Continuous Infusions: . sodium chloride 20 mL/hr at 05/22/15 1400  . sodium chloride    . sodium chloride 20 mL/hr at 05/22/15 1400  . insulin (NOVOLIN-R) infusion Stopped (05/22/15 1730)  . lactated ringers 20 mL/hr at 05/24/15 0800  . lactated ringers 20 mL/hr at 05/22/15 1400  . milrinone 0.3 mcg/kg/min (05/24/15 0800)  . nitroGLYCERIN Stopped (05/22/15 1300)  . phenylephrine (NEO-SYNEPHRINE) Adult infusion Stopped (05/23/15 0600)   PRN Meds:.sodium chloride, lactated ringers, metoprolol, midazolam, morphine injection, ondansetron (ZOFRAN) IV, oxyCODONE, sodium chloride flush, traMADol  General appearance: alert and cooperative Neurologic: intact Heart: regular rate and rhythm, S1, S2 normal, no murmur, click, rub or gallop and sinus tachycardia 110 /120 in chair  Lungs: diminished breath sounds  bibasilar Abdomen: soft, non-tender; bowel sounds normal; no masses,  no organomegaly Extremities: extremities normal, atraumatic, no cyanosis or edema and Homans sign is negative, no sign of DVT Wound: sternum stable  Lab Results: CBC: Recent Labs  05/23/15 1620 05/24/15 0345  WBC 11.7* 13.4*  HGB 9.5* 9.3*  HCT 29.1* 28.6*  PLT 223 245   BMET:  Recent Labs  05/23/15 0416 05/23/15 1616 05/23/15 1620 05/24/15 0345  NA 135 133*  --  131*  K 4.5 4.6  --  4.9  CL 105 98*  --  98*  CO2 23  --   --  25  GLUCOSE 129* 158*  --  110*  BUN 10 15  --  16  CREATININE 0.87 1.00 1.01 1.09  CALCIUM 8.0*  --   --  8.1*    PT/INR:  Recent Labs  05/22/15 1313  LABPROT 20.3*  INR 1.74*   Radiology: Dg Chest Port 1 View  05/24/2015  CLINICAL DATA:   Status post CABG 2 days ago EXAM: PORTABLE CHEST 1 VIEW COMPARISON:  Portable chest x-rays of February 21 and May 23, 2015. FINDINGS: The lungs are adequately inflated. There are persistent small bilateral pleural effusions. No pneumothorax or pneumomediastinum is observed. There is stable left lower lobe atelectasis. The cardiac silhouette remains enlarged. The pulmonary vascularity is less engorged and the pulmonary interstitial markings are normal. The sternal wires are intact. The Swan-Ganz catheter is been removed. The internal jugular Cordis sheath on the right is in stable position. The mediastinal drain appears to been removed. The left-sided chest tube is unchanged with its tip projecting over the lateral aspect of the left fourth rib. IMPRESSION: Stable cardiomegaly with interval improvement in the appearance of the pulmonary interstitium and decreased pulmonary vascular congestion. Persistent left lower lobe atelectasis and small bilateral pleural effusions. There is no pneumothorax. The remaining support tubes are in reasonable position. Electronically Signed   By: David  Martinique M.D.   On: 05/24/2015 07:28     Assessment/Plan: S/P Procedure(s) (LRB): CORONARY ARTERY BYPASS GRAFTING (CABG) LIMA-LAD and SVG-OM EVH RIGHT THIGH GREATER SAPHENOUS VEIN (N/A) TRANSESOPHAGEAL ECHOCARDIOGRAM (TEE) (N/A) Mobilize Diuresis Diabetes control Increase beta blocker Wean milrinone today   William [name] 05/24/2015 8:40 AM

## 2015-05-25 ENCOUNTER — Inpatient Hospital Stay (HOSPITAL_COMMUNITY): Payer: Medicare Other

## 2015-05-25 LAB — BASIC METABOLIC PANEL
Anion gap: 6 (ref 5–15)
BUN: 21 mg/dL — ABNORMAL HIGH (ref 6–20)
CO2: 27 mmol/L (ref 22–32)
Calcium: 8.2 mg/dL — ABNORMAL LOW (ref 8.9–10.3)
Chloride: 97 mmol/L — ABNORMAL LOW (ref 101–111)
Creatinine, Ser: 0.93 mg/dL (ref 0.61–1.24)
GFR calc Af Amer: >60 mL/min (ref >60)
GFR calc non Af Amer: >60 mL/min (ref >60)
Glucose, Bld: 104 mg/dL — ABNORMAL HIGH (ref 65–99)
Potassium: 4.7 mmol/L (ref 3.5–5.1)
Sodium: 130 mmol/L — ABNORMAL LOW (ref 135–145)

## 2015-05-25 LAB — GLUCOSE, CAPILLARY
Glucose-Capillary: 102 mg/dL — ABNORMAL HIGH (ref 65–99)
Glucose-Capillary: 106 mg/dL — ABNORMAL HIGH (ref 65–99)
Glucose-Capillary: 107 mg/dL — ABNORMAL HIGH (ref 65–99)
Glucose-Capillary: 107 mg/dL — ABNORMAL HIGH (ref 65–99)
Glucose-Capillary: 108 mg/dL — ABNORMAL HIGH (ref 65–99)
Glucose-Capillary: 119 mg/dL — ABNORMAL HIGH (ref 65–99)
Glucose-Capillary: 131 mg/dL — ABNORMAL HIGH (ref 65–99)
Glucose-Capillary: 97 mg/dL (ref 65–99)
Glucose-Capillary: 99 mg/dL (ref 65–99)

## 2015-05-25 LAB — CBC
HCT: 26.8 % — ABNORMAL LOW (ref 39.0–52.0)
Hemoglobin: 9 g/dL — ABNORMAL LOW (ref 13.0–17.0)
MCH: 31.5 pg (ref 26.0–34.0)
MCHC: 33.6 g/dL (ref 30.0–36.0)
MCV: 93.7 fL (ref 78.0–100.0)
Platelets: 218 10*3/uL (ref 150–400)
RBC: 2.86 MIL/uL — ABNORMAL LOW (ref 4.22–5.81)
RDW: 13.3 % (ref 11.5–15.5)
WBC: 9.2 10*3/uL (ref 4.0–10.5)

## 2015-05-25 MED ORDER — HYDROCHLOROTHIAZIDE 25 MG PO TABS
25.0000 mg | ORAL_TABLET | Freq: Every day | ORAL | Status: DC
  Administered 2015-05-25: 25 mg via ORAL
  Filled 2015-05-25: qty 1

## 2015-05-25 MED ORDER — ONDANSETRON HCL 4 MG PO TABS: 4.0000 mg | ORAL_TABLET | Freq: Four times a day (QID) | ORAL | Status: DC | PRN

## 2015-05-25 MED ORDER — CETYLPYRIDINIUM CHLORIDE 0.05 % MT LIQD
7.0000 mL | Freq: Two times a day (BID) | OROMUCOSAL | Status: DC
  Administered 2015-05-25 – 2015-05-29 (×8): 7 mL via OROMUCOSAL

## 2015-05-25 MED ORDER — DOCUSATE SODIUM 100 MG PO CAPS
200.0000 mg | ORAL_CAPSULE | Freq: Every day | ORAL | Status: DC
  Administered 2015-05-25 – 2015-05-29 (×5): 200 mg via ORAL
  Filled 2015-05-25 (×5): qty 2

## 2015-05-25 MED ORDER — ENOXAPARIN SODIUM 30 MG/0.3ML ~~LOC~~ SOLN
30.0000 mg | SUBCUTANEOUS | Status: DC
  Administered 2015-05-25 – 2015-05-29 (×5): 30 mg via SUBCUTANEOUS
  Filled 2015-05-25 (×8): qty 0.3

## 2015-05-25 MED ORDER — LOSARTAN POTASSIUM 50 MG PO TABS
100.0000 mg | ORAL_TABLET | Freq: Every day | ORAL | Status: DC
  Administered 2015-05-25: 100 mg via ORAL
  Filled 2015-05-25: qty 2

## 2015-05-25 MED ORDER — ONDANSETRON HCL 4 MG/2ML IJ SOLN
4.0000 mg | Freq: Four times a day (QID) | INTRAMUSCULAR | Status: DC | PRN
  Filled 2015-05-25: qty 2

## 2015-05-25 MED ORDER — BISACODYL 5 MG PO TBEC
10.0000 mg | DELAYED_RELEASE_TABLET | Freq: Every day | ORAL | Status: DC | PRN
  Administered 2015-05-25: 10 mg via ORAL
  Filled 2015-05-25 (×2): qty 2

## 2015-05-25 MED ORDER — OXYCODONE HCL 5 MG PO TABS
5.0000 mg | ORAL_TABLET | ORAL | Status: DC | PRN
  Administered 2015-05-25: 5 mg via ORAL
  Administered 2015-05-28 (×2): 10 mg via ORAL
  Administered 2015-05-28 – 2015-05-29 (×3): 5 mg via ORAL
  Filled 2015-05-25 (×2): qty 1
  Filled 2015-05-25 (×2): qty 2
  Filled 2015-05-25 (×2): qty 1

## 2015-05-25 MED ORDER — LEVALBUTEROL HCL 0.63 MG/3ML IN NEBU
0.6300 mg | INHALATION_SOLUTION | RESPIRATORY_TRACT | Status: DC | PRN
  Administered 2015-05-27 (×2): 0.63 mg via RESPIRATORY_TRACT
  Filled 2015-05-25 (×2): qty 3

## 2015-05-25 MED ORDER — LISINOPRIL 2.5 MG PO TABS: 2.5000 mg | ORAL_TABLET | Freq: Every day | ORAL | Status: DC

## 2015-05-25 MED ORDER — MOVING RIGHT ALONG BOOK
Freq: Once | Status: AC
  Administered 2015-05-25: 09:00:00
  Filled 2015-05-25: qty 1

## 2015-05-25 MED ORDER — INSULIN ASPART 100 UNIT/ML ~~LOC~~ SOLN: 0.0000 [IU] | Freq: Three times a day (TID) | SUBCUTANEOUS | Status: DC

## 2015-05-25 MED ORDER — TRAMADOL HCL 50 MG PO TABS
50.0000 mg | ORAL_TABLET | ORAL | Status: DC | PRN
  Administered 2015-05-25 – 2015-05-27 (×4): 100 mg via ORAL
  Filled 2015-05-25 (×5): qty 2

## 2015-05-25 MED ORDER — SODIUM CHLORIDE 0.9 % IV SOLN: 250.0000 mL | INTRAVENOUS | Status: DC | PRN

## 2015-05-25 MED ORDER — PANTOPRAZOLE SODIUM 40 MG PO TBEC
40.0000 mg | DELAYED_RELEASE_TABLET | Freq: Every day | ORAL | Status: DC
  Administered 2015-05-25 – 2015-05-29 (×5): 40 mg via ORAL
  Filled 2015-05-25 (×5): qty 1

## 2015-05-25 MED ORDER — METOPROLOL TARTRATE 25 MG PO TABS
25.0000 mg | ORAL_TABLET | Freq: Two times a day (BID) | ORAL | Status: DC
  Administered 2015-05-25 (×2): 25 mg via ORAL
  Filled 2015-05-25 (×2): qty 1

## 2015-05-25 MED ORDER — TIOTROPIUM BROMIDE MONOHYDRATE 18 MCG IN CAPS
18.0000 ug | ORAL_CAPSULE | Freq: Every day | RESPIRATORY_TRACT | Status: DC
  Administered 2015-05-25 – 2015-05-29 (×5): 18 ug via RESPIRATORY_TRACT
  Filled 2015-05-25: qty 5

## 2015-05-25 MED ORDER — GUAIFENESIN ER 600 MG PO TB12
600.0000 mg | ORAL_TABLET | Freq: Two times a day (BID) | ORAL | Status: DC | PRN
  Administered 2015-05-25: 600 mg via ORAL
  Filled 2015-05-25: qty 1

## 2015-05-25 MED ORDER — BISACODYL 10 MG RE SUPP: 10.0000 mg | Freq: Every day | RECTAL | Status: DC | PRN

## 2015-05-25 MED ORDER — ASPIRIN EC 325 MG PO TBEC
325.0000 mg | DELAYED_RELEASE_TABLET | Freq: Every day | ORAL | Status: DC
  Administered 2015-05-25 – 2015-05-29 (×5): 325 mg via ORAL
  Filled 2015-05-25 (×5): qty 1

## 2015-05-25 MED ORDER — SODIUM CHLORIDE 0.9% FLUSH
3.0000 mL | Freq: Two times a day (BID) | INTRAVENOUS | Status: DC
  Administered 2015-05-25 – 2015-05-29 (×9): 3 mL via INTRAVENOUS

## 2015-05-25 MED ORDER — LOSARTAN POTASSIUM-HCTZ 100-25 MG PO TABS: 1.0000 | ORAL_TABLET | Freq: Every day | ORAL | Status: DC

## 2015-05-25 MED ORDER — SODIUM CHLORIDE 0.9% FLUSH: 3.0000 mL | INTRAVENOUS | Status: DC | PRN

## 2015-05-25 MED FILL — Mannitol IV Soln 20%: INTRAVENOUS | Qty: 500 | Status: AC

## 2015-05-25 MED FILL — Heparin Sodium (Porcine) Inj 1000 Unit/ML: INTRAMUSCULAR | Qty: 10 | Status: AC

## 2015-05-25 MED FILL — Electrolyte-R (PH 7.4) Solution: INTRAVENOUS | Qty: 3000 | Status: AC

## 2015-05-25 MED FILL — Sodium Bicarbonate IV Soln 8.4%: INTRAVENOUS | Qty: 50 | Status: AC

## 2015-05-25 MED FILL — Sodium Chloride IV Soln 0.9%: INTRAVENOUS | Qty: 2000 | Status: AC

## 2015-05-25 MED FILL — Lidocaine HCl IV Inj 20 MG/ML: INTRAVENOUS | Qty: 10 | Status: AC

## 2015-05-25 NOTE — Progress Notes (Signed)
CARDIAC REHAB PHASE I   PRE:  Rate/Rhythm: 35 SR  BP:  Sitting: 121/66        SaO2: 100 2L  MODE:  Ambulation: 150 ft   POST:  Rate/Rhythm: 116 ST  BP:  Sitting: 91/67, 132/63 after cuff readjustment         SaO2: 95 2L  Pt recently transferred from ICU, c/o fatigue, states he did not sleep well in the ICU. Pt ambulated 150 ft on 2L O2, rolling walker, assist x1, fairly steady gait, tolerated well. Pt c/o fatigue, generalized weakness, leg pain (has hx PVD), mild dizziness, DOE, standing rest x1. Pt BP decreased upon returning to room, cuff readjusted, BP reading improved. Pt daughter at bedside, states she will be here after 12pm tomorrow to review discharge education. Discussed phase 2 cardiac rehab, pt agreeable to referral, will send to [place], [place]. Encouraged ambulation as tolerated, IS. Pt verbalized understanding. Pt to recliner after walk, feet elevated, call bell within reach.  Will follow.   [id number] Lenna Sciara, RN, BSN 05/25/2015 12:16 PM

## 2015-05-25 NOTE — Progress Notes (Signed)
Transferred to 2W24 via wheelchair. Portable oxygen and monitor on. No changes. Report given to Sonia Baller, [place].

## 2015-05-25 NOTE — Progress Notes (Signed)
Ambulated with Pt with front wheel walker and 02 @ 2L.  Pt ambulated around 30-40 feet when he indicated he could go no further.  Pt lowered into wheelchair and assisted back to room.  Pt heart rate into 140's when ambulating.  Pt assisted back to room and into bed.  Medication given for pain.  Will continue with IS hourly and continue pain management.

## 2015-05-25 NOTE — Progress Notes (Signed)
Pt arrived to unit with daughter.  Telemetry applied and CCMD notified.  Pt denies pain at this time.  Pt and daughter oriented to unit including call light and telephone.  Will cont to monitor.

## 2015-05-25 NOTE — Progress Notes (Signed)
Patient sitting up in bed, no distress at this time. Call light within reach

## 2015-05-25 NOTE — Progress Notes (Signed)
Patient ID: William [name], male   DOB: 02-Jun-1940, 75 y.o.   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 EVH RIGHT THIGH GREATER SAPHENOUS VEIN (N/A) TRANSESOPHAGEAL ECHOCARDIOGRAM (TEE) (N/A)  Total Length of Stay:  LOS: 6 days   Subjective: Alert, in chair, walked 100 feet today  Objective: Vital signs in last 24 hours: Temp:  [97.5 F (36.4 C)-99.5 F (37.5 C)] 98.5 F (36.9 C) (02/24 0733) Pulse Rate:  [93-142] 93 (02/24 0800) Cardiac Rhythm:  [-] Normal sinus rhythm (02/24 0800) Resp:  [11-22] 20 (02/24 0800) BP: (101-153)/(55-93) 141/71 mmHg (02/24 0800) SpO2:  [92 %-98 %] 98 % (02/24 0800) Weight:  [172 lb 9.9 oz (78.3 kg)] 172 lb 9.9 oz (78.3 kg) (02/24 0600)  Filed Weights   05/23/15 0545 05/24/15 0600 05/25/15 0600  Weight: 165 lb 12.6 oz (75.2 kg) 172 lb 9.9 oz (78.3 kg) 172 lb 9.9 oz (78.3 kg)    Weight change: 0 lb (0 kg)   Hemodynamic parameters for last 24 hours:    Intake/Output from previous day: 02/23 0701 - 02/24 0700 In: 810.2 [P.O.:240; I.V.:520.2; IV Piggyback:50] Out: [id number] [Urine:925; Chest Tube:90]  Intake/Output this shift: Total I/O In: 20 [I.V.:20] Out: 350 [Urine:350]  Current Meds: Scheduled Meds: . acetaminophen  1,000 mg Oral 4 times per day   Or  . acetaminophen (TYLENOL) oral liquid 160 mg/5 mL  1,000 mg Per Tube 4 times per day  . aspirin EC  325 mg Oral Daily   Or  . aspirin  324 mg Per Tube Daily  . bisacodyl  10 mg Oral Daily   Or  . bisacodyl  10 mg Rectal Daily  . docusate sodium  200 mg Oral Daily  . enoxaparin (LOVENOX) injection  30 mg Subcutaneous QHS  . gabapentin  600 mg Oral QHS  . insulin aspart  0-24 Units Subcutaneous 6 times per day  . levalbuterol  0.63 mg Nebulization TID  . metoprolol tartrate  25 mg Oral  BID   Or  . metoprolol tartrate  25 mg Per Tube BID  . pantoprazole  40 mg Oral Daily  . rosuvastatin  10 mg Oral q1800  . sodium chloride flush  3 mL Intravenous Q12H  . thiamine  100 mg Oral Daily   Continuous Infusions: . sodium chloride 20 mL/hr at 05/22/15 1400  . sodium chloride    . sodium chloride 20 mL/hr at 05/22/15 1400  . insulin (NOVOLIN-R) infusion Stopped (05/22/15 1730)  . lactated ringers 20 mL/hr at 05/25/15 0800  . lactated ringers 20 mL/hr at 05/22/15 1400  . nitroGLYCERIN Stopped (05/22/15 1300)  . phenylephrine (NEO-SYNEPHRINE) Adult infusion Stopped (05/23/15 0600)   PRN Meds:.sodium chloride, lactated ringers, metoprolol, midazolam, morphine injection, ondansetron (ZOFRAN) IV, oxyCODONE, sodium chloride flush, traMADol  General appearance: alert, cooperative and no distress Neurologic: intact Heart: regular rate and rhythm, S1, S2 normal, no murmur, click, rub or gallop Lungs: diminished breath sounds bibasilar Abdomen: soft, non-tender; bowel sounds normal; no masses,  no organomegaly Extremities: extremities normal, atraumatic, no cyanosis or edema and Homans sign is negative, no sign of DVT Wound: sternum stable silver dressing  in place  Lab Results: CBC: Recent Labs  05/24/15 0345 05/25/15 0535  WBC 13.4* 9.2  HGB 9.3* 9.0*  HCT 28.6* 26.8*  PLT 245 218   BMET:  Recent Labs  05/24/15 0345 05/25/15 0535  NA 131* 130*  K 4.9 4.7  CL 98* 97*  CO2 25 27  GLUCOSE 110* 104*  BUN 16 21*  CREATININE 1.09 0.93  CALCIUM 8.1* 8.2*    PT/INR:  Recent Labs  05/22/15 1313  LABPROT 20.3*  INR 1.74*   Radiology: Dg Chest Port 1 View  05/25/2015  CLINICAL DATA:  Chest tube. EXAM: PORTABLE CHEST 1 VIEW COMPARISON:  05/24/2015, 05/23/2015, 05/22/2015, 05/18/2015 FINDINGS: Right IJ sheath in stable position. Interim removal of left chest tube. No definite pneumothorax. Left apical pleural thickening unchanged from prior exams. Prior CABG. Stable  cardiomegaly. Persistent left lower lobe atelectasis and/or infiltrate. Progressive right base atelectasis and/or infiltrate. Bilateral pleural effusions again noted. IMPRESSION: 1. Interim removal of left chest tube. No definite pneumothorax. Left apical pleural markings are stable from multiple prior exams and most likely related to scarring. 2. Persistent left lower lobe atelectasis and/or infiltrate. Progressive right lower lobe atelectasis and/or infiltrate. Small bilateral pleural effusions again noted. 3. Prior CABG. Stable cardiomegaly. No pulmonary venous congestion . Electronically Signed   By: William [name]  Register   On: 05/25/2015 07:48     Assessment/Plan: S/P Procedure(s) (LRB): CORONARY ARTERY BYPASS GRAFTING (CABG) LIMA-LAD and SVG-OM EVH RIGHT THIGH GREATER SAPHENOUS VEIN (N/A) TRANSESOPHAGEAL ECHOCARDIOGRAM (TEE) (N/A) Mobilize Diuresis Diabetes control Plan for transfer to step-down: see transfer orders Add low dose ace with recent MI , and significant anterior apical hypokenesis   William [name] 05/25/2015 8:33 AM

## 2015-05-26 ENCOUNTER — Inpatient Hospital Stay (HOSPITAL_COMMUNITY): Payer: Medicare Other

## 2015-05-26 LAB — BASIC METABOLIC PANEL
Anion gap: 8 (ref 5–15)
BUN: 35 mg/dL — ABNORMAL HIGH (ref 6–20)
CO2: 25 mmol/L (ref 22–32)
Calcium: 8.5 mg/dL — ABNORMAL LOW (ref 8.9–10.3)
Chloride: 95 mmol/L — ABNORMAL LOW (ref 101–111)
Creatinine, Ser: 1.54 mg/dL — ABNORMAL HIGH (ref 0.61–1.24)
GFR calc Af Amer: 50 mL/min — ABNORMAL LOW (ref >60)
GFR calc non Af Amer: 43 mL/min — ABNORMAL LOW (ref >60)
Glucose, Bld: 112 mg/dL — ABNORMAL HIGH (ref 65–99)
Potassium: 4.8 mmol/L (ref 3.5–5.1)
Sodium: 128 mmol/L — ABNORMAL LOW (ref 135–145)

## 2015-05-26 LAB — GLUCOSE, CAPILLARY
Glucose-Capillary: 109 mg/dL — ABNORMAL HIGH (ref 65–99)
Glucose-Capillary: 89 mg/dL (ref 65–99)
Glucose-Capillary: 98 mg/dL (ref 65–99)
Glucose-Capillary: 97 mg/dL (ref 65–99)

## 2015-05-26 LAB — CBC
HCT: 27.4 % — ABNORMAL LOW (ref 39.0–52.0)
Hemoglobin: 9.2 g/dL — ABNORMAL LOW (ref 13.0–17.0)
MCH: 31 pg (ref 26.0–34.0)
MCHC: 33.6 g/dL (ref 30.0–36.0)
MCV: 92.3 fL (ref 78.0–100.0)
Platelets: 288 10*3/uL (ref 150–400)
RBC: 2.97 MIL/uL — ABNORMAL LOW (ref 4.22–5.81)
RDW: 13.2 % (ref 11.5–15.5)
WBC: 8.8 10*3/uL (ref 4.0–10.5)

## 2015-05-26 MED ORDER — METOPROLOL TARTRATE 12.5 MG HALF TABLET
37.5000 mg | ORAL_TABLET | Freq: Two times a day (BID) | ORAL | Status: DC
Start: 2015-05-26 — End: 2015-05-27
  Administered 2015-05-26 (×2): 37.5 mg via ORAL
  Filled 2015-05-26 (×2): qty 1

## 2015-05-26 NOTE — Progress Notes (Signed)
[id number] Patient ambulated with RN. Continued CABG d/c education with patient's daughter present including sternal precautions, signs/sx's of infections, daily weights, heart healthy eating and activity progressions. Pt states he has no questions at this time, recovery from heart surgery has been viewed.  Sol Passer, MS, ACSM CCEP

## 2015-05-26 NOTE — Progress Notes (Addendum)
[address]       [place] [address]             [phone]      4 Days Post-Op Procedure(s) (LRB): CORONARY ARTERY BYPASS GRAFTING (CABG) LIMA-LAD and SVG-OM EVH RIGHT THIGH GREATER SAPHENOUS VEIN (N/A) TRANSESOPHAGEAL ECHOCARDIOGRAM (TEE) (N/A) Subjective: Slept well, feels fairly well   Objective: Vital signs in last 24 hours: Temp:  [97.6 F (36.4 C)-100.2 F (37.9 C)] 98 F (36.7 C) (02/25 0554) Pulse Rate:  [96-120] 107 (02/25 0554) Cardiac Rhythm:  [-] Sinus tachycardia (02/25 0700) Resp:  [18-19] 18 (02/25 0554) BP: (120-137)/(59-80) 120/64 mmHg (02/25 0554) SpO2:  [89 %-97 %] 97 % (02/25 0554) Weight:  [168 lb 6.4 oz (76.386 kg)] 168 lb 6.4 oz (76.386 kg) (02/25 0554)  Hemodynamic parameters for last 24 hours:    Intake/Output from previous day: 02/24 0701 - 02/25 0700 In: 260 [P.O.:240; I.V.:20] Out: 800 [Urine:800] Intake/Output this shift:    General appearance: alert, cooperative and no distress Heart: regular rate and rhythm Lungs: dim in lower fields Abdomen: benign Extremities: no edema Wound: incis healing well a little sternal drainage, bone is stable, no redness   Lab Results:  Recent Labs  05/25/15 0535 05/26/15 0406  WBC 9.2 8.8  HGB 9.0* 9.2*  HCT 26.8* 27.4*  PLT 218 288   BMET:  Recent Labs  05/25/15 0535 05/26/15 0406  NA 130* 128*  K 4.7 4.8  CL 97* 95*  CO2 27 25  GLUCOSE 104* 112*  BUN 21* 35*  CREATININE 0.93 1.54*  CALCIUM 8.2* 8.5*    PT/INR: No results for input(s): LABPROT, INR in the last 72 hours. ABG    Component Value Date/Time   PHART 7.316* 05/22/2015 2010   HCO3 17.9* 05/22/2015 2010   TCO2 23 05/23/2015 1616   ACIDBASEDEF 7.0* 05/22/2015 2010   O2SAT 90.0 05/22/2015 2010   CBG (last 3)   Recent Labs  05/25/15 1625 05/25/15 2112 05/26/15 0625  GLUCAP 97 99 109*    Meds Scheduled Meds: . antiseptic oral rinse  7 mL Mouth Rinse BID  . aspirin EC  325 mg Oral  Daily  . docusate sodium  200 mg Oral Daily  . enoxaparin (LOVENOX) injection  30 mg Subcutaneous Q24H  . gabapentin  600 mg Oral QHS  . losartan  100 mg Oral Daily   And  . hydrochlorothiazide  25 mg Oral Daily  . insulin aspart  0-24 Units Subcutaneous TID AC & HS  . metoprolol tartrate  25 mg Oral BID  . pantoprazole  40 mg Oral QAC breakfast  . rosuvastatin  10 mg Oral q1800  . sodium chloride flush  3 mL Intravenous Q12H  . thiamine  100 mg Oral Daily  . tiotropium  18 mcg Inhalation Daily   Continuous Infusions:  PRN Meds:.sodium chloride, bisacodyl **OR** bisacodyl, guaiFENesin, levalbuterol, ondansetron **OR** ondansetron (ZOFRAN) IV, oxyCODONE, sodium chloride flush, traMADol  Xrays Dg Chest Port 1 View  05/25/2015  CLINICAL DATA:  Chest tube. EXAM: PORTABLE CHEST 1 VIEW COMPARISON:  05/24/2015, 05/23/2015, 05/22/2015, 05/18/2015 FINDINGS: Right IJ sheath in stable position. Interim removal of left chest tube. No definite pneumothorax. Left apical pleural thickening unchanged from prior exams. Prior CABG. Stable cardiomegaly. Persistent left lower lobe atelectasis and/or infiltrate. Progressive right base atelectasis and/or infiltrate. Bilateral pleural effusions again noted. IMPRESSION: 1. Interim removal of left chest tube. No definite pneumothorax. Left apical pleural markings are stable  from multiple prior exams and most likely related to scarring. 2. Persistent left lower lobe atelectasis and/or infiltrate. Progressive right lower lobe atelectasis and/or infiltrate. Small bilateral pleural effusions again noted. 3. Prior CABG. Stable cardiomegaly. No pulmonary venous congestion . Electronically Signed   By: Marcello Moores  Register   On: 05/25/2015 07:48    Assessment/Plan: S/P Procedure(s) (LRB): CORONARY ARTERY BYPASS GRAFTING (CABG) LIMA-LAD and SVG-OM EVH RIGHT THIGH GREATER SAPHENOUS VEIN (N/A) TRANSESOPHAGEAL ECHOCARDIOGRAM (TEE) (N/A)  1 good progress 2 progressive  hyponatremia and renal insufficiency- I believe we should hold ARB/diuretic for now. Recheck BMET in am and then poss start lasix. No vasc congestion or significant edema but does have peural effusions 3 sinus tach/PVC's- will increase beta blocker 4 ABL anemia is stable 5 push rehab/pulm toilet  LOS: 7 days    GOLD,WAYNE E 05/26/2015  I have seen and examined the patient and agree with the assessment and plan as outlined.  Rexene Alberts, MD 05/26/2015 12:49 PM

## 2015-05-27 LAB — GLUCOSE, CAPILLARY
Glucose-Capillary: 106 mg/dL — ABNORMAL HIGH (ref 65–99)
Glucose-Capillary: 96 mg/dL (ref 65–99)
Glucose-Capillary: 116 mg/dL — ABNORMAL HIGH (ref 65–99)
Glucose-Capillary: 107 mg/dL — ABNORMAL HIGH (ref 65–99)

## 2015-05-27 LAB — BASIC METABOLIC PANEL
Anion gap: 12 (ref 5–15)
BUN: 46 mg/dL — ABNORMAL HIGH (ref 6–20)
CO2: 26 mmol/L (ref 22–32)
Calcium: 8.5 mg/dL — ABNORMAL LOW (ref 8.9–10.3)
Chloride: 92 mmol/L — ABNORMAL LOW (ref 101–111)
Creatinine, Ser: 1.22 mg/dL (ref 0.61–1.24)
GFR calc Af Amer: >60 mL/min (ref >60)
GFR calc non Af Amer: 57 mL/min — ABNORMAL LOW (ref >60)
Glucose, Bld: 116 mg/dL — ABNORMAL HIGH (ref 65–99)
Potassium: 4.4 mmol/L (ref 3.5–5.1)
Sodium: 130 mmol/L — ABNORMAL LOW (ref 135–145)

## 2015-05-27 MED ORDER — METOPROLOL TARTRATE 50 MG PO TABS
50.0000 mg | ORAL_TABLET | Freq: Two times a day (BID) | ORAL | Status: DC
  Administered 2015-05-27 – 2015-05-29 (×4): 50 mg via ORAL
  Filled 2015-05-27 (×5): qty 1

## 2015-05-27 MED ORDER — POTASSIUM CHLORIDE CRYS ER 10 MEQ PO TBCR
10.0000 meq | EXTENDED_RELEASE_TABLET | Freq: Every day | ORAL | Status: DC
  Administered 2015-05-27: 10 meq via ORAL
  Filled 2015-05-27: qty 1

## 2015-05-27 MED ORDER — LACTULOSE 10 GM/15ML PO SOLN: 30.0000 g | Freq: Every day | ORAL | Status: DC | PRN

## 2015-05-27 MED ORDER — FUROSEMIDE 40 MG PO TABS
40.0000 mg | ORAL_TABLET | Freq: Every day | ORAL | Status: DC
  Administered 2015-05-27 – 2015-05-28 (×2): 40 mg via ORAL
  Filled 2015-05-27 (×2): qty 1

## 2015-05-27 NOTE — Progress Notes (Addendum)
[address]       [place] [address]             [phone]      5 Days Post-Op Procedure(s) (LRB): CORONARY ARTERY BYPASS GRAFTING (CABG) LIMA-LAD and SVG-OM EVH RIGHT THIGH GREATER SAPHENOUS VEIN (N/A) TRANSESOPHAGEAL ECHOCARDIOGRAM (TEE) (N/A) Subjective: Feels fairly well, constipated but did have a BM this am  Objective: Vital signs in last 24 hours: Temp:  [98 F (36.7 C)-98.6 F (37 C)] 98 F (36.7 C) (02/26 0459) Pulse Rate:  [97-109] 103 (02/26 0459) Cardiac Rhythm:  [-] Sinus tachycardia (02/26 0712) Resp:  [18-20] 20 (02/26 0459) BP: (105-137)/(57-70) 137/70 mmHg (02/26 0459) SpO2:  [95 %-99 %] 98 % (02/26 0459) Weight:  [171 lb 12.8 oz (77.928 kg)] 171 lb 12.8 oz (77.928 kg) (02/26 0459)  Hemodynamic parameters for last 24 hours:    Intake/Output from previous day: 02/25 0701 - 02/26 0700 In: 480 [P.O.:480] Out: -  Intake/Output this shift:    General appearance: alert, cooperative and no distress Heart: regular rate and rhythm and tachy Lungs: coarse upper airway BS Abdomen: Mod distension, + BS, non-tender Extremities: min edema Wound: incis healing well  Lab Results:  Recent Labs  05/25/15 0535 05/26/15 0406  WBC 9.2 8.8  HGB 9.0* 9.2*  HCT 26.8* 27.4*  PLT 218 288   BMET:  Recent Labs  05/26/15 0406 05/27/15 0303  NA 128* 130*  K 4.8 4.4  CL 95* 92*  CO2 25 26  GLUCOSE 112* 116*  BUN 35* 46*  CREATININE 1.54* 1.22  CALCIUM 8.5* 8.5*    PT/INR: No results for input(s): LABPROT, INR in the last 72 hours. ABG    Component Value Date/Time   PHART 7.316* 05/22/2015 2010   HCO3 17.9* 05/22/2015 2010   TCO2 23 05/23/2015 1616   ACIDBASEDEF 7.0* 05/22/2015 2010   O2SAT 90.0 05/22/2015 2010   CBG (last 3)   Recent Labs  05/26/15 1607 05/26/15 2056 05/27/15 0645  GLUCAP 98 97 106*    Meds Scheduled Meds: . antiseptic oral rinse  7 mL Mouth Rinse BID  . aspirin EC  325 mg Oral Daily  . docusate  sodium  200 mg Oral Daily  . enoxaparin (LOVENOX) injection  30 mg Subcutaneous Q24H  . gabapentin  600 mg Oral QHS  . insulin aspart  0-24 Units Subcutaneous TID AC & HS  . metoprolol tartrate  37.5 mg Oral BID  . pantoprazole  40 mg Oral QAC breakfast  . rosuvastatin  10 mg Oral q1800  . sodium chloride flush  3 mL Intravenous Q12H  . thiamine  100 mg Oral Daily  . tiotropium  18 mcg Inhalation Daily   Continuous Infusions:  PRN Meds:.sodium chloride, bisacodyl **OR** bisacodyl, guaiFENesin, levalbuterol, ondansetron **OR** ondansetron (ZOFRAN) IV, oxyCODONE, sodium chloride flush, traMADol  Xrays Dg Chest 2 View  05/26/2015  CLINICAL DATA:  75 year old male - status post CABG postop day 4. EXAM: CHEST  2 VIEW COMPARISON:  05/25/2015 and prior exams FINDINGS: Cardiomegaly, CABG changes, small-moderate bilateral pleural effusions and bibasilar atelectasis again noted. There is no evidence of pulmonary edema or pneumothorax. No other changes are identified. IMPRESSION: Little significant change with cardiomegaly, small to moderate bilateral pleural effusions and bibasilar atelectasis. No evidence of pneumothorax. Electronically Signed   By: Margarette Canada M.D.   On: 05/26/2015 09:43    Assessment/Plan: S/P Procedure(s) (LRB): CORONARY ARTERY BYPASS GRAFTING (CABG) LIMA-LAD and SVG-OM EVH  RIGHT THIGH GREATER SAPHENOUS VEIN (N/A) TRANSESOPHAGEAL ECHOCARDIOGRAM (TEE) (N/A)  1 sodium and creat improved off of ARB/HCTZ- will give some lasix today 2 needs continued agressive pulm toilet , wean O2 off 3 fairly tachy at times to 120's, should be able to tolerate a little higher beta blocker dose - watch for bronchospasm. Cont spiriva 4 repeat bmet in am 5 ambulate 6 lactulose for constipation  LOS: 8 days    GOLD,WAYNE E 05/27/2015  I have seen and examined the patient and agree with the assessment and plan as outlined.  Rexene Alberts, MD 05/27/2015 10:29 AM

## 2015-05-28 LAB — BASIC METABOLIC PANEL
Anion gap: 12 (ref 5–15)
BUN: 33 mg/dL — ABNORMAL HIGH (ref 6–20)
CO2: 28 mmol/L (ref 22–32)
Calcium: 8.4 mg/dL — ABNORMAL LOW (ref 8.9–10.3)
Chloride: 90 mmol/L — ABNORMAL LOW (ref 101–111)
Creatinine, Ser: 0.93 mg/dL (ref 0.61–1.24)
GFR calc Af Amer: >60 mL/min (ref >60)
GFR calc non Af Amer: >60 mL/min (ref >60)
Glucose, Bld: 108 mg/dL — ABNORMAL HIGH (ref 65–99)
Potassium: 3.3 mmol/L — ABNORMAL LOW (ref 3.5–5.1)
Sodium: 130 mmol/L — ABNORMAL LOW (ref 135–145)

## 2015-05-28 LAB — GLUCOSE, CAPILLARY
Glucose-Capillary: 91 mg/dL (ref 65–99)
Glucose-Capillary: 94 mg/dL (ref 65–99)
Glucose-Capillary: 92 mg/dL (ref 65–99)
Glucose-Capillary: 100 mg/dL — ABNORMAL HIGH (ref 65–99)

## 2015-05-28 MED ORDER — POTASSIUM CHLORIDE CRYS ER 20 MEQ PO TBCR
40.0000 meq | EXTENDED_RELEASE_TABLET | Freq: Two times a day (BID) | ORAL | Status: DC
  Administered 2015-05-28 (×2): 40 meq via ORAL
  Filled 2015-05-28 (×2): qty 2

## 2015-05-28 NOTE — Care Management Important Message (Signed)
Important Message  Patient Details  Name: William [name] MRN: [id number] Date of Birth: 11-30-1940   Medicare Important Message Given:  Yes    Airlie Blumenberg Abena 05/28/2015, 11:06 AM

## 2015-05-28 NOTE — Progress Notes (Addendum)
Pt ambulated 768ft on RA and maintained O2 sat of 90% during ambulation. SaO2 returned to 92% while at rest. Pt denies SOB od dizziness . Will continue to monitor

## 2015-05-28 NOTE — Progress Notes (Addendum)
PT ambulated 777ft on RA, pt SaO2 at rest before ambulation was 94% however during ambulation it dropped to 76%,pt did denies SOB or dizziness. on returning back to the room pt SaO2 was 94% while lying in bed. Pt resting quietly in bed Will continue to monitor.

## 2015-05-28 NOTE — Progress Notes (Signed)
[address]       [place] [address]             [phone]      6 Days Post-Op Procedure(s) (LRB): CORONARY ARTERY BYPASS GRAFTING (CABG) LIMA-LAD and SVG-OM EVH RIGHT THIGH GREATER SAPHENOUS VEIN (N/A) TRANSESOPHAGEAL ECHOCARDIOGRAM (TEE) (N/A) Subjective: Feels better each day, O2 being weaned, sinus tachy with PAC's  Objective: Vital signs in last 24 hours: Temp:  [98.2 F (36.8 C)-98.7 F (37.1 C)] 98.2 F (36.8 C) (02/27 0608) Pulse Rate:  [77-110] 77 (02/27 0608) Cardiac Rhythm:  [-] Sinus tachycardia (02/26 1900) Resp:  [18-20] 20 (02/27 0608) BP: (91-104)/(59-80) 104/80 mmHg (02/27 0608) SpO2:  [91 %-98 %] 92 % (02/27 0608) Weight:  [166 lb 1.6 oz (75.342 kg)] 166 lb 1.6 oz (75.342 kg) (02/27 [id number])  Hemodynamic parameters for last 24 hours:    Intake/Output from previous day: 02/26 0701 - 02/27 0700 In: 720 [P.O.:720] Out: 1653 [Urine:1653] Intake/Output this shift:    General appearance: alert, cooperative and no distress Heart: regular rate and rhythm Lungs: mildly dim in bases Abdomen: benign Extremities: no edema Wound: incis healing well  Lab Results:  Recent Labs  05/26/15 0406  WBC 8.8  HGB 9.2*  HCT 27.4*  PLT 288   BMET:  Recent Labs  05/27/15 0303 05/28/15 0324  NA 130* 130*  K 4.4 3.3*  CL 92* 90*  CO2 26 28  GLUCOSE 116* 108*  BUN 46* 33*  CREATININE 1.22 0.93  CALCIUM 8.5* 8.4*    PT/INR: No results for input(s): LABPROT, INR in the last 72 hours. ABG    Component Value Date/Time   PHART 7.316* 05/22/2015 2010   HCO3 17.9* 05/22/2015 2010   TCO2 23 05/23/2015 1616   ACIDBASEDEF 7.0* 05/22/2015 2010   O2SAT 90.0 05/22/2015 2010   CBG (last 3)   Recent Labs  05/27/15 1600 05/27/15 2059 05/28/15 0540  GLUCAP 116* 107* 91    Meds Scheduled Meds: . antiseptic oral rinse  7 mL Mouth Rinse BID  . aspirin EC  325 mg Oral Daily  . docusate sodium  200 mg Oral Daily  . enoxaparin  (LOVENOX) injection  30 mg Subcutaneous Q24H  . furosemide  40 mg Oral Daily  . gabapentin  600 mg Oral QHS  . insulin aspart  0-24 Units Subcutaneous TID AC & HS  . metoprolol tartrate  50 mg Oral BID  . pantoprazole  40 mg Oral QAC breakfast  . potassium chloride  10 mEq Oral Daily  . rosuvastatin  10 mg Oral q1800  . sodium chloride flush  3 mL Intravenous Q12H  . thiamine  100 mg Oral Daily  . tiotropium  18 mcg Inhalation Daily   Continuous Infusions:  PRN Meds:.sodium chloride, bisacodyl **OR** bisacodyl, guaiFENesin, lactulose, levalbuterol, ondansetron **OR** ondansetron (ZOFRAN) IV, oxyCODONE, sodium chloride flush, traMADol  Xrays Dg Chest 2 View  05/26/2015  CLINICAL DATA:  75 year old male - status post CABG postop day 4. EXAM: CHEST  2 VIEW COMPARISON:  05/25/2015 and prior exams FINDINGS: Cardiomegaly, CABG changes, small-moderate bilateral pleural effusions and bibasilar atelectasis again noted. There is no evidence of pulmonary edema or pneumothorax. No other changes are identified. IMPRESSION: Little significant change with cardiomegaly, small to moderate bilateral pleural effusions and bibasilar atelectasis. No evidence of pneumothorax. Electronically Signed   By: Margarette Canada M.D.   On: 05/26/2015 09:43    Assessment/Plan: S/P Procedure(s) (LRB): CORONARY ARTERY BYPASS  GRAFTING (CABG) LIMA-LAD and SVG-OM EVH RIGHT THIGH GREATER SAPHENOUS VEIN (N/A) TRANSESOPHAGEAL ECHOCARDIOGRAM (TEE) (N/A)  1 doing well with steady progress 2 cont O2 wean and pulm toilet-much improved 3 freq PAC's , will cont beta blocker and replace K+ 4 sodium stable and creat now normal, cont lasix short term, lasix short term 5 sugars ok 6 BP low - no ARB/ACE-I for now 6 poss d/c 1-2 days  LOS: 9 days    Samyra Limb E 05/28/2015

## 2015-05-28 NOTE — Progress Notes (Signed)
CARDIAC REHAB PHASE I   PRE:  Rate/Rhythm: 110 ST c/ PACs  BP:  Sitting: 90/66 (L arm), 153/60 (R arm)       SaO2: 95 RA  MODE:  Ambulation: 150 ft   POST:  Rate/Rhythm: 144 ST c/ PACs, 120 ST c/ PACs after 2 minutes rest  BP:  Sitting: 157/85         SaO2: 94 RA  Pt in bed on RA, c/o incisional pain, productive cough. Pt states he walked once early this morning and has not received anything for pain since yesterday evening.  Pt agreeable to walk. Pt needed some assistance to stand, good use of sternal precautions. Pt ambulated 150 ft on RA, rolling walker, condom catheter, assist x1, slow, steady gait, tolerated fair. Pt c/o pain, DOE, fatigue, standing rest x1, O2 sats 94% on RA with ambulation. Pt HR elevated 140s. Of note, pt did not receive his metoprolol last night, however there is a significant difference in BP readings between the L/R arm. Pt to recliner after walk, feet elevated, call bell within reach. Encouraged better pain control, IS, additional ambulation x1 more today. Will follow.     [place], RN, BSN  05/28/2015 11:29 AM

## 2015-05-29 LAB — BASIC METABOLIC PANEL
Anion gap: 12 (ref 5–15)
BUN: 23 mg/dL — ABNORMAL HIGH (ref 6–20)
CO2: 27 mmol/L (ref 22–32)
Calcium: 8.9 mg/dL (ref 8.9–10.3)
Chloride: 93 mmol/L — ABNORMAL LOW (ref 101–111)
Creatinine, Ser: 0.95 mg/dL (ref 0.61–1.24)
GFR calc Af Amer: >60 mL/min (ref >60)
GFR calc non Af Amer: >60 mL/min (ref >60)
Glucose, Bld: 101 mg/dL — ABNORMAL HIGH (ref 65–99)
Potassium: 4.2 mmol/L (ref 3.5–5.1)
Sodium: 132 mmol/L — ABNORMAL LOW (ref 135–145)

## 2015-05-29 LAB — GLUCOSE, CAPILLARY
Glucose-Capillary: 90 mg/dL (ref 65–99)
Glucose-Capillary: 81 mg/dL (ref 65–99)

## 2015-05-29 LAB — MAGNESIUM: Magnesium: 1.8 mg/dL (ref 1.7–2.4)

## 2015-05-29 MED ORDER — OXYCODONE HCL 5 MG PO TABS: 5.0000 mg | ORAL_TABLET | Freq: Four times a day (QID) | ORAL | Status: DC | PRN

## 2015-05-29 MED ORDER — FUROSEMIDE 40 MG PO TABS: 40.0000 mg | ORAL_TABLET | Freq: Every day | ORAL | Status: DC

## 2015-05-29 MED ORDER — ROSUVASTATIN CALCIUM 10 MG PO TABS: 10.0000 mg | ORAL_TABLET | Freq: Every day | ORAL | Status: DC

## 2015-05-29 MED ORDER — FUROSEMIDE 40 MG PO TABS
40.0000 mg | ORAL_TABLET | Freq: Every day | ORAL | Status: DC
  Administered 2015-05-29: 40 mg via ORAL
  Filled 2015-05-29: qty 1

## 2015-05-29 MED ORDER — POTASSIUM CHLORIDE CRYS ER 20 MEQ PO TBCR: 20.0000 meq | EXTENDED_RELEASE_TABLET | Freq: Every day | ORAL | Status: DC

## 2015-05-29 MED ORDER — FUROSEMIDE 10 MG/ML IJ SOLN
20.0000 mg | Freq: Once | INTRAMUSCULAR | Status: AC
  Administered 2015-05-29: 20 mg via INTRAVENOUS
  Filled 2015-05-29: qty 2

## 2015-05-29 MED ORDER — METOPROLOL TARTRATE 50 MG PO TABS: 50.0000 mg | ORAL_TABLET | Freq: Two times a day (BID) | ORAL | Status: DC

## 2015-05-29 MED ORDER — POTASSIUM CHLORIDE CRYS ER 20 MEQ PO TBCR
20.0000 meq | EXTENDED_RELEASE_TABLET | Freq: Every day | ORAL | Status: DC
  Administered 2015-05-29: 20 meq via ORAL
  Filled 2015-05-29: qty 1

## 2015-05-29 MED ORDER — ASPIRIN 325 MG PO TBEC: 325.0000 mg | DELAYED_RELEASE_TABLET | Freq: Every day | ORAL | Status: DC

## 2015-05-29 NOTE — Progress Notes (Signed)
Pt education was given for removal of pacing wires, pt verbalised understanding, 4 pacing wires removed, pt remained stable, will continue to monitor.

## 2015-05-29 NOTE — Discharge Instructions (Signed)
Endoscopic Saphenous Vein Harvesting, Care After °Refer to this sheet in the next few weeks. These instructions provide you with information on caring for yourself after your procedure. Your health care provider may also give you more specific instructions. Your treatment has been planned according to current medical practices, but problems sometimes occur. Call your health care provider if you have any problems or questions after your procedure. °HOME CARE INSTRUCTIONS °Medicine °· Take whatever pain medicine your surgeon prescribes. Follow the directions carefully. Do not take over-the-counter pain medicine unless your surgeon says it is okay. Some pain medicine can cause bleeding problems for several weeks after surgery. °· Follow your surgeon's instructions about driving. You will probably not be permitted to drive after heart surgery. °· Take any medicines your surgeon prescribes. Any medicines you took before your heart surgery should be checked with your health care provider before you start taking them again. °Wound care °· If your surgeon has prescribed an elastic bandage or stocking, ask how long you should wear it. °· Check the area around your surgical cuts (incisions) whenever your bandages (dressings) are changed. Look for any redness or swelling. °· You will need to return to have the stitches (sutures) or staples taken out. Ask your surgeon when to do that. °· Ask your surgeon when you can shower or bathe. °Activity °· Try to keep your legs raised when you are sitting. °· Do any exercises your health care providers have given you. These may include deep breathing exercises, coughing, walking, or other exercises. °SEEK MEDICAL CARE IF: °· You have any questions about your medicines. °· You have more leg pain, especially if your pain medicine stops working. °· New or growing bruises develop on your leg. °· Your leg swells, feels tight, or becomes red. °· You have numbness in your leg. °SEEK IMMEDIATE  MEDICAL CARE IF: °· Your pain gets much worse. °· Blood or fluid leaks from any of the incisions. °· Your incisions become warm, swollen, or red. °· You have chest pain. °· You have trouble breathing. °· You have a fever. °· You have more pain near your leg incision. °MAKE SURE YOU: °· Understand these instructions. °· Will watch your condition. °· Will get help right away if you are not doing well or get worse. °  °This information is not intended to replace advice given to you by your health care provider. Make sure you discuss any questions you have with your health care provider. °  °Document Released: 11/27/2010 Document Revised: 04/07/2014 Document Reviewed: 11/27/2010 °Elsevier Interactive Patient Education ©2016 Else[employer] Inc[employer] °Coronary Artery Bypass Grafting, Care After °These instructions give you information on caring for yourself after your procedure. Your doctor may also give you more specific instructions. Call your doctor if you have any problems or questions after your procedure.  °HOME CARE °· Only take medicine as told by your doctor. Take medicines exactly as told. Do not stop taking medicines or start any new medicines without talking to your doctor first. °· Take your pulse as told by your doctor. °· Do deep breathing as told by your doctor. Use your breathing device (incentive spirometer), if given, to practice deep breathing several times a day. Support your chest with a pillow or your arms when you take deep breaths or cough. °· Keep the area clean, dry, and protected where the surgery cuts (incisions) were made. Remove bandages (dressings) only as told by your doctor. If strips were applied to surgical area, do not take   them off. They fall off on their own. °· Check the surgery area daily for puffiness (swelling), redness, or leaking fluid. °· If surgery cuts were made in your legs: °· Avoid crossing your legs. °· Avoid sitting for long periods of time. Change positions every 30  minutes. °· Raise your legs when you are sitting. Place them on pillows. °· Wear stockings that help keep blood clots from forming in your legs (compression stockings). °· Only take sponge baths until your doctor says it is okay to take showers. Pat the surgery area dry. Do not rub the surgery area with a washcloth or towel. Do not bathe, swim, or use a hot tub until your doctor says it is okay. °· Eat foods that are high in fiber. These include raw fruits and vegetables, whole grains, beans, and nuts. Choose lean meats. Avoid canned, processed, and fried foods. °· Drink enough fluids to keep your pee (urine) clear or pale yellow. °· Weigh yourself every day. °· Rest and limit activity as told by your doctor. You may be told to: °· Stop any activity if you have chest pain, shortness of breath, changes in heartbeat, or dizziness. Get help right away if this happens. °· Move around often for short amounts of time or take short walks as told by your doctor. Gradually become more active. You may need help to strengthen your muscles and build endurance. °· Avoid lifting, pushing, or pulling anything heavier than 10 pounds (4.5 kg) for at least 6 weeks after surgery. °· Do not drive until your doctor says it is okay. °· Ask your doctor when you can go back to work. °· Ask your doctor when you can begin sexual activity again. °· Follow up with your doctor as told. °GET HELP IF: °· You have puffiness, redness, more pain, or fluid draining from the incision site. °· You have a fever. °· You have puffiness in your ankles or legs. °· You have pain in your legs. °· You gain 2 or more pounds (0.9 kg) a day. °· You feel sick to your stomach (nauseous) or throw up (vomit). °· You have watery poop (diarrhea). °GET HELP RIGHT AWAY IF: °· You have chest pain that goes to your jaw or arms. °· You have shortness of breath. °· You have a fast or irregular heartbeat. °· You notice a "clicking" in your breastbone when you move. °· You  have numbness or weakness in your arms or legs. °· You feel dizzy or light-headed. °MAKE SURE YOU: °· Understand these instructions. °· Will watch your condition. °· Will get help right away if you are not doing well or get worse. °  °This information is not intended to replace advice given to you by your health care provider. Make sure you discuss any questions you have with your health care provider. °  °Document Released: 03/22/2013 Document Reviewed: 03/22/2013 °Elsevier Interactive Patient Education ©2016 Elsevier Inc. ° °

## 2015-05-29 NOTE — Discharge Summary (Signed)
Physician Discharge Summary  Patient ID: William [name] MRN: [id number] DOB/AGE: Dec 17, 1940 75 y.o.  Admit date: 05/19/2015 Discharge date: 05/29/2015  Admission Diagnoses:Npn-Stemi  Discharge Diagnoses:  Active Problems:   NSTEMI (non-ST elevated myocardial infarction) ([place])   S/P CABG x 2  Patient Active Problem List   Diagnosis Date Noted  . S/P CABG x 2 05/22/2015  . NSTEMI (non-ST elevated myocardial infarction) ([place]) 05/19/2015  . 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 Renal artery stenosis ([place]) 07/06/2013  . Peripheral vascular disease, unspecified ([place]) 12/03/2011  . Occlusion and stenosis of carotid artery without mention of cerebral infarction 12/03/2011  . HYPERLIPIDEMIA 06/27/2008  . ALCOHOL ABUSE 06/27/2008  . HYPERTENSION 06/27/2008  . SPLENOMEGALY 06/27/2008  . COLONIC POLYPS, ADENOMATOUS, HX OF 06/27/2008   HPI:at time of admission  Patient is a 75 yo [place] M with h/o HTN, HLD, COPD, tobacco abuse, previous alcohol abuse, PVD s/p right external iliac stent, and carotid stenosis who presented to [facility] with chest pain. Patient reports chest pain over the last month, however, his episode of chest pain was more severe and lasted longer today, prompting him to seek medical care. This episode started at 4 pm on 2/17 and feels like a pressure across his chest, radiating to his left shoulder/arm and occasionally to the back of his neck. He has SOB at baseline, which he attributes to his COPD, but denies increased SOB. He denies N/V or diaphoresis. He denies any history of coronary disease.  He took 1 aspirin on the way to the ED and the chest pain improved. In the ED, he was started on a nitro gtt at 10 mcg and his symptoms completely resolved.  On arrival to [facility], he is chest pain free and has no complaints.  He was admitted for further evaluation and treatment.  Discharged Condition: good  Hospital Course: The patient ruled in for  non-STEMI. He had inferolateral ST depressions and mildly elevated troponins. When he became pain-free his EKG normalized. He was admitted for further management, placed on intravenous heparin and nitroglycerin and scheduled for cardiac catheterization as well as TTE. His troponin I peaked at 25.15. Cardiac catheterization was done on 05/21/2015 and he was found to have a 99% left main stenosis as well as severe LAD disease. Due to these findings cardiothoracic surgical consultation was obtained with Lanelle Bal M.D. who evaluated the patient and his studies and agreed with recommendations to proceed with coronary artery surgical revascularization. On 05/22/2015 he underwent the below described surgical procedure. He tolerated well and was taken to the surgical intensive care unit in stable condition. Postoperatively, the patient has overall progressed fairly well. He was weaned from the ventilator using standard protocols without significant difficulty. All routine lines, monitors and drainage devices have been discontinued in the standard fashion. He has expected acute blood loss anemia which is stable. Renal function became elevated postoperatively but improved after discontinuing ARB. Most recent creatinine is 0.95. He has had some postoperative volume overload but this has responded well to diuretics. His blood pressure remains somewhat low most of the time so ACE inhibitor has not been reinitiated. Oxygen has been somewhat difficult to wean but he is responding to aggressive pulmonary toilet and active mobilization. Incisions are healing well without evidence of infection. He is tolerating diet. He could potentially go home later today if we can get him off the oxygen saturations are acceptable on room air.  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  SURGICAL PROCEDURE: Coronary artery bypass grafting x2 with the left internal mammary to the left anterior descending coronary artery, and reverse saphenous vein graft to the first obtuse marginal coronary artery with right thigh endo vein greater saphenous harvesting.  SURGEON: Lanelle Bal, MD  FIRST ASSISTANT: John Giovanni, P.A.-C.  Discharge Exam: Blood pressure 148/77, pulse 93, temperature 98.1 F (36.7 C), temperature source Oral, resp. rate 18, height 5\' 6"  (1.676 m), weight 165 lb 12.6 oz (75.2 kg), SpO2 98 %.   General appearance: alert, cooperative and no distress Heart: regular rate and rhythm Lungs: clear to auscultation bilaterally Abdomen: benign Extremities: no edema Wound: incis healing well   Disposition:discharged home in good condition      Discharge Instructions    Amb Referral to Cardiac Rehabilitation    Complete by:  As directed   To [place], [place]  Diagnosis:   CABG Myocardial Infarction              Medication List    STOP taking these medications        aspirin 81 MG tablet  Replaced by:  aspirin 325 MG EC tablet     labetalol 300 MG tablet  Commonly known as:  NORMODYNE     losartan-hydrochlorothiazide 100-25 MG tablet  Commonly known as:  HYZAAR      TAKE these medications        aspirin 325 MG EC tablet  Take 1 tablet (325 mg total) by mouth daily.     fish oil-omega-3 fatty acids 1000 MG capsule  Take 1,000 g by mouth 2 (two) times daily.     furosemide 40 MG tablet  Commonly known as:  LASIX  Take 1 tablet (40 mg total) by mouth daily.     gabapentin 300 MG capsule  Commonly known as:  NEURONTIN  Take 600 mg by mouth every evening.      Garlic [id number] MG Tabs  Take 1,250 mg by mouth daily.     metoprolol 50 MG tablet  Commonly known as:  LOPRESSOR  Take 1 tablet (50 mg total) by mouth 2 (two) times daily.     multivitamin tablet  Take 1 tablet by mouth daily.     omeprazole 20 MG capsule  Commonly known as:  PRILOSEC  Take 20 mg by mouth daily.     oxyCODONE 5 MG immediate release tablet  Commonly known as:  Oxy IR/ROXICODONE  Take 1-2 tablets (5-10 mg total) by mouth every 6 (six) hours as needed for severe pain.     potassium chloride SA 20 MEQ tablet  Commonly known as:  K-DUR,KLOR-CON  Take 1 tablet (20 mEq total) by mouth daily.     rosuvastatin 10 MG tablet  Commonly known as:  CRESTOR  Take 1 tablet (10 mg total) by mouth daily.     SPIRIVA HANDIHALER 18 MCG inhalation capsule  Generic drug:  tiotropium  Place 18 mcg into inhaler and inhale daily.     thiamine 100 MG tablet  Commonly known as:  VITAMIN B-1  Take 100 mg by mouth daily.     vitamin C 1000 MG tablet  Take 1,000 mg by mouth daily.  vitamin E 400 UNIT capsule  Take 400 Units by mouth daily.       Follow-up Information    Follow up with [name], MD.   Specialty:  Cardiothoracic Surgery   Why:  Appointment to see the surgeon on 06/21/2015 at 1:30 PM. Please obtain a chest x-ray at 1 PM at [facility]. [facility] is located in the same office complex.   Contact information:   [address] [place] [address] [phone]       Follow up with Nah[place], Wonda Cheng, MD.   Specialty:  Cardiology   Why:  06/11/15 cardiology appointment   Contact information:   [address] [place] [address] [phone]       Follow up with [facility].   Why:  RW and 3n1 arranged- to be delivered to room prior to discharge   Contact information:   [address] [place] [address] [phone]       Follow up with [facility].    Why:  HH-RN/PT arranged- they will call you to arrange visits [phone])   Contact information:   [address] [place] [address] [phone]     The patient has been discharged on:   1.Beta Blocker:  Yes Blue.Reese   ]                              No   [   ]                              If No, reason:  2.Ace Inhibitor/ARB: Yes [   ]                                     No  [  n  ]                                     If No, reason:low bp/acute renal insuff after starting ARB   3.Statin:   Yes [ y  ]                  No  [   ]                  If No, reason:  4.Ecasa:  Yes  [ y  ]                  No   [   ]                  If No, reason:   Signed: Jianna Drabik E 05/29/2015, 12:12 PM

## 2015-05-29 NOTE — Care Management Note (Signed)
Case Management Note Previous CM note initiated by [facility] RN, CM  Patient Details  Name: William [name] MRN: [id number] Date of Birth: 10/17/1940  Subjective/Objective:     Per pt, he has cane and walker @ home although he currently uses neither one.  States his daughter has already arranged to take FMLA to provide 24/7 assistance when he is stable for discharge.                             Action/Plan: Pt admitted with NSTEMI- s/p CABG- plan to d/c home with family- and [place] services- orders placed for HH-RN/PT- spoke with pt and son at bedside- choice offered for [facility] agencies in [place] area- per choice they would like to use [facility]- call made to [facility] [place]- spoke with Vaughan Basta who accepted referral- info faxed to [place]- at [phone]- pt also will need RW and 3n1- spoke with Brenton Grills at [facility] regarding DME needs- DME to be delivered to room prior to discharge- no further CM needs noted. Pt address and phone # confirmed in [place]  Expected Discharge Date:     05/29/15             Expected Discharge Plan:  [place]  In-House Referral:     Discharge planning Services  CM Consult  Post Acute Care Choice:  Durable Medical Equipment, Home Health Choice offered to:  Patient, Adult Children  DME Arranged:  3-N-1, Walker rolling DME Agency:  [place] Arranged:  RN, PT [facility] Agency:  [facility]  Status of Service:  Completed, signed off  Medicare Important Message Given:  Yes Date Medicare IM Given:    Medicare IM give by:    Date Additional Medicare IM Given:    Additional Medicare Important Message give by:     If discussed at [place] of Stay Meetings, dates discussed:   Discharge Disposition: Home/home health    Additional Comments:  Dawayne Patricia, RN 05/29/2015, 12:05 PM

## 2015-05-29 NOTE — Progress Notes (Signed)
Pt reports that he has been using his incentive spirometer but refuses using it while this RN is in the room. Pt education was given.

## 2015-05-29 NOTE — Progress Notes (Signed)
CARDIAC REHAB PHASE I   PRE:  Rate/Rhythm: 87 SR    BP: sitting 151/78    SaO2: 95 RA  MODE:  Ambulation: 300 ft   POST:  Rate/Rhythm: 103 ST    BP: sitting 161/81     SaO2: 90-92 RA  Pt assist x1 to stand (son sts he can help at home). Used RW, steady. Rest when he felt winded. SaO2 lowest at 90 RA, increased with rest and PLB. Pt able to increase distance. Reviewed ed with pt and son. Will refer to [place], ACSM 05/29/2015 11:42 AM

## 2015-05-29 NOTE — Progress Notes (Addendum)
Pt in stable condition. This RN went over discharge instructions and education with pt and family, they verbalised understanding, pt belonging at bedside, paper prescriptions given to pt,.IV taken out, cardiac monitor d/c ccmd notified. Pt taken off the floor on a wheelchair by a NT Will continue to monitor

## 2015-05-29 NOTE — Progress Notes (Addendum)
[address]       [place] [address]             [phone]      7 Days Post-Op Procedure(s) (LRB): CORONARY ARTERY BYPASS GRAFTING (CABG) LIMA-LAD and SVG-OM EVH RIGHT THIGH GREATER SAPHENOUS VEIN (N/A) TRANSESOPHAGEAL ECHOCARDIOGRAM (TEE) (N/A) Subjective: Feels ok, O2 back on currently, was off most of yesterday  Objective: Vital signs in last 24 hours: Temp:  [97.8 F (36.6 C)-98.2 F (36.8 C)] 98.1 F (36.7 C) (02/28 0614) Pulse Rate:  [80-112] 91 (02/28 0614) Cardiac Rhythm:  [-] Normal sinus rhythm (02/27 1900) Resp:  [18] 18 (02/28 0614) BP: (90-164)/(52-84) 164/83 mmHg (02/28 0614) SpO2:  [93 %-100 %] 100 % (02/28 0614) Weight:  [165 lb 12.6 oz (75.2 kg)] 165 lb 12.6 oz (75.2 kg) (02/28 [id number])  Hemodynamic parameters for last 24 hours:    Intake/Output from previous day: 02/27 0701 - 02/28 0700 In: 480 [P.O.:480] Out: 600 [Urine:600] Intake/Output this shift:    General appearance: alert, cooperative and no distress Heart: regular rate and rhythm Lungs: clear to auscultation bilaterally Abdomen: benign Extremities: no edema Wound: incis healing well  Lab Results: No results for input(s): WBC, HGB, HCT, PLT in the last 72 hours. BMET:  Recent Labs  05/28/15 0324 05/29/15 0520  NA 130* 132*  K 3.3* 4.2  CL 90* 93*  CO2 28 27  GLUCOSE 108* 101*  BUN 33* 23*  CREATININE 0.93 0.95  CALCIUM 8.4* 8.9    PT/INR: No results for input(s): LABPROT, INR in the last 72 hours. ABG    Component Value Date/Time   PHART 7.316* 05/22/2015 2010   HCO3 17.9* 05/22/2015 2010   TCO2 23 05/23/2015 1616   ACIDBASEDEF 7.0* 05/22/2015 2010   O2SAT 90.0 05/22/2015 2010   CBG (last 3)   Recent Labs  05/28/15 1216 05/28/15 1647 05/28/15 2158  GLUCAP 94 92 100*    Meds Scheduled Meds: . antiseptic oral rinse  7 mL Mouth Rinse BID  . aspirin EC  325 mg Oral Daily  . docusate sodium  200 mg Oral Daily  . enoxaparin (LOVENOX)  injection  30 mg Subcutaneous Q24H  . furosemide  40 mg Oral Daily  . gabapentin  600 mg Oral QHS  . insulin aspart  0-24 Units Subcutaneous TID AC & HS  . metoprolol tartrate  50 mg Oral BID  . pantoprazole  40 mg Oral QAC breakfast  . potassium chloride  40 mEq Oral BID  . rosuvastatin  10 mg Oral q1800  . sodium chloride flush  3 mL Intravenous Q12H  . thiamine  100 mg Oral Daily  . tiotropium  18 mcg Inhalation Daily   Continuous Infusions:  PRN Meds:.sodium chloride, bisacodyl **OR** bisacodyl, guaiFENesin, lactulose, levalbuterol, ondansetron **OR** ondansetron (ZOFRAN) IV, oxyCODONE, sodium chloride flush, traMADol  Xrays No results found.  Assessment/Plan: S/P Procedure(s) (LRB): CORONARY ARTERY BYPASS GRAFTING (CABG) LIMA-LAD and SVG-OM EVH RIGHT THIGH GREATER SAPHENOUS VEIN (N/A) TRANSESOPHAGEAL ECHOCARDIOGRAM (TEE) (N/A) d/c pacing wires will see how he does with ambulation- if no O2 needed can d/c today Will have physical therapy see - may benefit from hhpt Cont lasix/K+ short term  LOS: 10 days    [name],[name] 05/29/2015  Patient will need po lasix at discharge for 2 weeks Last cxr wet 3 days ago Iv lasix dose ordered this am DC home with rolling walker later today  patient examined and medical record reviewed,agree with  above note. Tharon Aquas Trigt III 05/29/2015

## 2015-05-30 DIAGNOSIS — Z48812 Encounter for surgical aftercare following surgery on the circulatory system: Secondary | ICD-10-CM | POA: Diagnosis not present

## 2015-05-30 DIAGNOSIS — I739 Peripheral vascular disease, unspecified: Secondary | ICD-10-CM | POA: Diagnosis not present

## 2015-05-30 DIAGNOSIS — I2510 Atherosclerotic heart disease of native coronary artery without angina pectoris: Secondary | ICD-10-CM | POA: Diagnosis not present

## 2015-05-30 DIAGNOSIS — I10 Essential (primary) hypertension: Secondary | ICD-10-CM | POA: Diagnosis not present

## 2015-05-30 DIAGNOSIS — J449 Chronic obstructive pulmonary disease, unspecified: Secondary | ICD-10-CM | POA: Diagnosis not present

## 2015-05-30 DIAGNOSIS — I214 Non-ST elevation (NSTEMI) myocardial infarction: Secondary | ICD-10-CM | POA: Diagnosis not present

## 2015-05-31 DIAGNOSIS — I2510 Atherosclerotic heart disease of native coronary artery without angina pectoris: Secondary | ICD-10-CM | POA: Diagnosis not present

## 2015-05-31 DIAGNOSIS — J449 Chronic obstructive pulmonary disease, unspecified: Secondary | ICD-10-CM | POA: Diagnosis not present

## 2015-05-31 DIAGNOSIS — I739 Peripheral vascular disease, unspecified: Secondary | ICD-10-CM | POA: Diagnosis not present

## 2015-05-31 DIAGNOSIS — I214 Non-ST elevation (NSTEMI) myocardial infarction: Secondary | ICD-10-CM | POA: Diagnosis not present

## 2015-05-31 DIAGNOSIS — Z48812 Encounter for surgical aftercare following surgery on the circulatory system: Secondary | ICD-10-CM | POA: Diagnosis not present

## 2015-05-31 DIAGNOSIS — I10 Essential (primary) hypertension: Secondary | ICD-10-CM | POA: Diagnosis not present

## 2015-06-01 DIAGNOSIS — I10 Essential (primary) hypertension: Secondary | ICD-10-CM | POA: Diagnosis not present

## 2015-06-01 DIAGNOSIS — I2510 Atherosclerotic heart disease of native coronary artery without angina pectoris: Secondary | ICD-10-CM | POA: Diagnosis not present

## 2015-06-01 DIAGNOSIS — I739 Peripheral vascular disease, unspecified: Secondary | ICD-10-CM | POA: Diagnosis not present

## 2015-06-01 DIAGNOSIS — I214 Non-ST elevation (NSTEMI) myocardial infarction: Secondary | ICD-10-CM | POA: Diagnosis not present

## 2015-06-01 DIAGNOSIS — Z48812 Encounter for surgical aftercare following surgery on the circulatory system: Secondary | ICD-10-CM | POA: Diagnosis not present

## 2015-06-01 DIAGNOSIS — J449 Chronic obstructive pulmonary disease, unspecified: Secondary | ICD-10-CM | POA: Diagnosis not present

## 2015-06-04 DIAGNOSIS — I214 Non-ST elevation (NSTEMI) myocardial infarction: Secondary | ICD-10-CM | POA: Diagnosis not present

## 2015-06-04 DIAGNOSIS — I10 Essential (primary) hypertension: Secondary | ICD-10-CM | POA: Diagnosis not present

## 2015-06-04 DIAGNOSIS — Z48812 Encounter for surgical aftercare following surgery on the circulatory system: Secondary | ICD-10-CM | POA: Diagnosis not present

## 2015-06-04 DIAGNOSIS — I739 Peripheral vascular disease, unspecified: Secondary | ICD-10-CM | POA: Diagnosis not present

## 2015-06-04 DIAGNOSIS — J449 Chronic obstructive pulmonary disease, unspecified: Secondary | ICD-10-CM | POA: Diagnosis not present

## 2015-06-04 DIAGNOSIS — I2510 Atherosclerotic heart disease of native coronary artery without angina pectoris: Secondary | ICD-10-CM | POA: Diagnosis not present

## 2015-06-05 ENCOUNTER — Ambulatory Visit (INDEPENDENT_AMBULATORY_CARE_PROVIDER_SITE_OTHER): Payer: Medicare Other | Admitting: Cardiovascular Disease

## 2015-06-05 ENCOUNTER — Encounter: Payer: Self-pay | Admitting: Cardiovascular Disease

## 2015-06-05 VITALS — BP 130/68 | HR 60 | Ht 66.0 in | Wt 174.0 lb

## 2015-06-05 DIAGNOSIS — J449 Chronic obstructive pulmonary disease, unspecified: Secondary | ICD-10-CM | POA: Diagnosis not present

## 2015-06-05 DIAGNOSIS — I214 Non-ST elevation (NSTEMI) myocardial infarction: Secondary | ICD-10-CM

## 2015-06-05 DIAGNOSIS — Z9289 Personal history of other medical treatment: Secondary | ICD-10-CM

## 2015-06-05 DIAGNOSIS — I10 Essential (primary) hypertension: Secondary | ICD-10-CM | POA: Diagnosis not present

## 2015-06-05 DIAGNOSIS — I739 Peripheral vascular disease, unspecified: Secondary | ICD-10-CM | POA: Diagnosis not present

## 2015-06-05 DIAGNOSIS — Z87898 Personal history of other specified conditions: Secondary | ICD-10-CM

## 2015-06-05 DIAGNOSIS — Z48812 Encounter for surgical aftercare following surgery on the circulatory system: Secondary | ICD-10-CM | POA: Diagnosis not present

## 2015-06-05 DIAGNOSIS — I2510 Atherosclerotic heart disease of native coronary artery without angina pectoris: Secondary | ICD-10-CM | POA: Diagnosis not present

## 2015-06-05 DIAGNOSIS — I25812 Atherosclerosis of bypass graft of coronary artery of transplanted heart without angina pectoris: Secondary | ICD-10-CM | POA: Diagnosis not present

## 2015-06-05 DIAGNOSIS — Z951 Presence of aortocoronary bypass graft: Secondary | ICD-10-CM | POA: Diagnosis not present

## 2015-06-05 MED ORDER — LISINOPRIL 5 MG PO TABS: 5.0000 mg | ORAL_TABLET | Freq: Every day | ORAL | Status: DC

## 2015-06-05 MED ORDER — ROSUVASTATIN CALCIUM 10 MG PO TABS: 10.0000 mg | ORAL_TABLET | Freq: Every day | ORAL | Status: DC

## 2015-06-05 NOTE — Patient Instructions (Signed)
   Begin Lisnopril 5mg  daily - new sent to [facility] today.   Continue all other medications.   Cardiac Rehab referral. Your physician wants you to follow up in: 6 months.  You will receive a reminder letter in the mail one-two months in advance.  If you don't receive a letter, please call our office to schedule the follow up appointment

## 2015-06-05 NOTE — Progress Notes (Signed)
Patient ID: William [name], male   DOB: Nov 18, 1940, 75 y.o.   MRN: [id number]      SUBJECTIVE: The patient presents for post-hospitalization follow up after undergoing 2-vessel CABG.  He was hospitalized in February with a non-STEMI. Coronary angiography demonstrated critical left main obstruction and thus underwent a LIMA to the LAD and saphenous vein graft to the first obtuse marginal branch. Additional past medical history includes hypertension, hyperlipidemia, COPD, tobacco abuse, and peripheral vascular disease with a history of right external iliac artery stenting.  Echocardiogram on 05/19/15 demonstrated normal left ventricular systolic function with ischemic wall motion abnormalities, EF 50-55%, mild LVH, grade 1 diastolic dysfunction, and mild to moderate aortic regurgitation.  He has some chest wall pain but denies exertional chest discomfort. SBP at home has been running in the 150-160 mmHg range. No leg swelling. Appetite has improved.  Has been doing some mild exercises at home.  Review of Systems: As per "subjective", otherwise negative.  Allergies  Allergen Reactions  . Lipitor [Atorvastatin] Other (See Comments)    Leg pain  . Zocor [Simvastatin] Other (See Comments)    Leg pain    Current Outpatient Prescriptions  Medication Sig Dispense Refill  . Ascorbic Acid (VITAMIN C) 1000 MG tablet Take 1,000 mg by mouth daily.    Marland Kitchen aspirin EC 325 MG EC tablet Take 1 tablet (325 mg total) by mouth daily.    Marland Kitchen docusate sodium (COLACE) 100 MG capsule Take 100 mg by mouth daily as needed for mild constipation.    . fish oil-omega-3 fatty acids 1000 MG capsule Take 1,000 g by mouth 2 (two) times daily.    . furosemide (LASIX) 40 MG tablet Take 1 tablet (40 mg total) by mouth daily. 14 tablet 0  . gabapentin (NEURONTIN) 300 MG capsule Take 600 mg by mouth every evening.     . Garlic [id number] MG TABS Take 1,250 mg by mouth daily.     . metoprolol (LOPRESSOR) 50 MG tablet Take 1 tablet (50  mg total) by mouth 2 (two) times daily. 60 tablet 1  . Multiple Vitamin (MULTIVITAMIN) tablet Take 1 tablet by mouth daily.    Marland Kitchen oxyCODONE (OXY IR/ROXICODONE) 5 MG immediate release tablet Take 1-2 tablets (5-10 mg total) by mouth every 6 (six) hours as needed for severe pain. 50 tablet 0  . potassium chloride SA (K-DUR,KLOR-CON) 20 MEQ tablet Take 1 tablet (20 mEq total) by mouth daily. 14 tablet 0  . rosuvastatin (CRESTOR) 10 MG tablet Take 1 tablet (10 mg total) by mouth daily. 30 tablet 1  . SPIRIVA HANDIHALER 18 MCG inhalation capsule Place 18 mcg into inhaler and inhale daily.   8  . thiamine (VITAMIN B-1) 100 MG tablet Take 100 mg by mouth daily.    . traMADol (ULTRAM) 50 MG tablet Take 50 mg by mouth every 6 (six) hours as needed.    . vitamin E 400 UNIT capsule Take 400 Units by mouth daily.     No current facility-administered medications for this visit.    Past Medical History  Diagnosis Date  . Hypertension   . Hyperlipidemia   . Peripheral arterial disease ([place])   . Diabetes mellitus     Type II  . Colon polyp   . Carotid artery occlusion   . 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 COPD (chronic obstructive pulmonary disease) [facility])     Past Surgical History  Procedure Laterality Date  . Hiatal hernia repair    .  Percutaneous translumnial angioplasty  04/23/2009    Catheterization of Lefst external iliac artery with Left lower extremity runoff  . Bilateral renal  artery stenoses  04/23/2009  . Cardiac catheterization N/A 05/21/2015    Procedure: Left Heart Cath and Coronary Angiography;  Surgeon: Lorretta Harp, MD;  Location: [place] [facility];  Service: Cardiovascular;  Laterality: N/A;  . Coronary artery bypass graft N/A 05/22/2015    Procedure: CORONARY ARTERY BYPASS GRAFTING (CABG) LIMA-LAD and SVG-OM EVH RIGHT THIGH GREATER SAPHENOUS VEIN;  Surgeon: [name], MD;  Location: [place];  Service: Open Heart Surgery;  Laterality: N/A;  . Tee without cardioversion N/A  05/22/2015    Procedure: TRANSESOPHAGEAL ECHOCARDIOGRAM (TEE);  Surgeon: [name], MD;  Location: [place];  Service: Open Heart Surgery;  Laterality: N/A;    Social History   Social History  . Marital Status: Married    Spouse Name: N/A  . Number of Children: N/A  . Years of Education: N/A   Occupational History  . Not on file.   Social History Main Topics  . Smoking status: Former Smoker -- 2.00 packs/day    Types: Cigarettes    Quit date: 11/17/1991  . Smokeless tobacco: Never Used  . Alcohol Use: 1.2 - 2.4 oz/week    2-4 Standard drinks or equivalent per week  . Drug Use: No  . Sexual Activity: Not on file   Other Topics Concern  . Not on file   Social History Narrative     Filed Vitals:   06/05/15 1344  BP: 130/68  Pulse: 60  Height: 5\' 6"  (1.676 m)  Weight: 174 lb (78.926 kg)  SpO2: 98%    PHYSICAL EXAM General: NAD HEENT: Normal. Neck: No JVD, no thyromegaly. Lungs: Clear to auscultation bilaterally with normal respiratory effort. CV: Nondisplaced PMI. Well healed midline sternotomy. Regular rate and rhythm, normal S1/S2, no S3/S4, no murmur. No pretibial or periankle edema.  No carotid bruit.   Abdomen: Soft, nontender, no distention.  Neurologic: Alert and oriented.  Psych: Normal affect. Skin: Normal. Musculoskeletal: No gross deformities.  ECG: Most recent ECG reviewed.      ASSESSMENT AND PLAN: 1. CAD s/p 2-vessel CABG with NSTEMI: Symptomatically stable. Currently taking ASA, metoprolol, and Crestor. Will add ACEI. Will make cardiac rehab referral.  2. Essential HTN: Controlled today but consistently elevated at home. Will add lisinopril 5 mg daily given NSTEMI.  3. Hyperlipidemia: LDL 89 on 05/19/15. Continue Crestor.  4. PVD: Follows with vascular surgery.  Dispo: f/u 6 months.  Kate Sable, M.D., F.A.C.C.

## 2015-06-06 DIAGNOSIS — I214 Non-ST elevation (NSTEMI) myocardial infarction: Secondary | ICD-10-CM | POA: Diagnosis not present

## 2015-06-06 DIAGNOSIS — I2510 Atherosclerotic heart disease of native coronary artery without angina pectoris: Secondary | ICD-10-CM | POA: Diagnosis not present

## 2015-06-06 DIAGNOSIS — J449 Chronic obstructive pulmonary disease, unspecified: Secondary | ICD-10-CM | POA: Diagnosis not present

## 2015-06-06 DIAGNOSIS — I739 Peripheral vascular disease, unspecified: Secondary | ICD-10-CM | POA: Diagnosis not present

## 2015-06-06 DIAGNOSIS — I10 Essential (primary) hypertension: Secondary | ICD-10-CM | POA: Diagnosis not present

## 2015-06-06 DIAGNOSIS — Z48812 Encounter for surgical aftercare following surgery on the circulatory system: Secondary | ICD-10-CM | POA: Diagnosis not present

## 2015-06-11 ENCOUNTER — Encounter: Payer: Medicare Other | Admitting: Physician Assistant

## 2015-06-12 ENCOUNTER — Telehealth: Payer: Self-pay | Admitting: Cardiovascular Disease

## 2015-06-12 DIAGNOSIS — I739 Peripheral vascular disease, unspecified: Secondary | ICD-10-CM | POA: Diagnosis not present

## 2015-06-12 DIAGNOSIS — I214 Non-ST elevation (NSTEMI) myocardial infarction: Secondary | ICD-10-CM | POA: Diagnosis not present

## 2015-06-12 DIAGNOSIS — J449 Chronic obstructive pulmonary disease, unspecified: Secondary | ICD-10-CM | POA: Diagnosis not present

## 2015-06-12 DIAGNOSIS — I2510 Atherosclerotic heart disease of native coronary artery without angina pectoris: Secondary | ICD-10-CM | POA: Diagnosis not present

## 2015-06-12 DIAGNOSIS — Z48812 Encounter for surgical aftercare following surgery on the circulatory system: Secondary | ICD-10-CM | POA: Diagnosis not present

## 2015-06-12 DIAGNOSIS — I10 Essential (primary) hypertension: Secondary | ICD-10-CM | POA: Diagnosis not present

## 2015-06-12 NOTE — Telephone Encounter (Signed)
Suggested Dr. Teryl Lucy in [place] since that is where patient lives.  Have several providers in that group that are good options.

## 2015-06-12 NOTE — Telephone Encounter (Signed)
William [name] would like a recommendation from Dr. Bronson Ing on establishing with a primary care doctor in [place].

## 2015-06-14 DIAGNOSIS — Z48812 Encounter for surgical aftercare following surgery on the circulatory system: Secondary | ICD-10-CM | POA: Diagnosis not present

## 2015-06-14 DIAGNOSIS — I2510 Atherosclerotic heart disease of native coronary artery without angina pectoris: Secondary | ICD-10-CM | POA: Diagnosis not present

## 2015-06-14 DIAGNOSIS — J449 Chronic obstructive pulmonary disease, unspecified: Secondary | ICD-10-CM | POA: Diagnosis not present

## 2015-06-14 DIAGNOSIS — I10 Essential (primary) hypertension: Secondary | ICD-10-CM | POA: Diagnosis not present

## 2015-06-14 DIAGNOSIS — I739 Peripheral vascular disease, unspecified: Secondary | ICD-10-CM | POA: Diagnosis not present

## 2015-06-14 DIAGNOSIS — I214 Non-ST elevation (NSTEMI) myocardial infarction: Secondary | ICD-10-CM | POA: Diagnosis not present

## 2015-06-15 ENCOUNTER — Telehealth: Payer: Self-pay | Admitting: *Deleted

## 2015-06-15 DIAGNOSIS — I2510 Atherosclerotic heart disease of native coronary artery without angina pectoris: Secondary | ICD-10-CM | POA: Diagnosis not present

## 2015-06-15 DIAGNOSIS — I739 Peripheral vascular disease, unspecified: Secondary | ICD-10-CM | POA: Diagnosis not present

## 2015-06-15 DIAGNOSIS — I214 Non-ST elevation (NSTEMI) myocardial infarction: Secondary | ICD-10-CM | POA: Diagnosis not present

## 2015-06-15 DIAGNOSIS — I10 Essential (primary) hypertension: Secondary | ICD-10-CM | POA: Diagnosis not present

## 2015-06-15 DIAGNOSIS — J449 Chronic obstructive pulmonary disease, unspecified: Secondary | ICD-10-CM | POA: Diagnosis not present

## 2015-06-15 DIAGNOSIS — Z48812 Encounter for surgical aftercare following surgery on the circulatory system: Secondary | ICD-10-CM | POA: Diagnosis not present

## 2015-06-15 MED ORDER — METOPROLOL TARTRATE 25 MG PO TABS: 25.0000 mg | ORAL_TABLET | Freq: Two times a day (BID) | ORAL | Status: DC

## 2015-06-15 NOTE — Telephone Encounter (Signed)
Will forward to DOD, Dr. Harl Bowie out of office this afternoon

## 2015-06-15 NOTE — Telephone Encounter (Signed)
Patient of Dr. Bronson Ing. Reviewed recent note. For now would recommend cutting Lopressor to 25 mg twice daily, keep an eye on heart rate. With recent cough would also be observant for worsening symptoms or any associated fever. He was placed on an ACE inhibitor recently and this is also a possibility. If symptoms worsen, would contact office again next week as he may need a chest x-ray.

## 2015-06-15 NOTE — Telephone Encounter (Signed)
Pt and son made aware and voiced understanding of decreasing Lopressor, monitoring HR and symptoms with cough. Pt will call with update next week. Also spoke with Quillian Quince, RN at [facility] and went over changes - scheduled to see pt next week as well and will call us with update. Will forward to Dr Court Joy nurse for f/u

## 2015-06-15 NOTE — Telephone Encounter (Signed)
Daniel nurse from home health says pt HR is 48 today (usually 60-70) c/o productive cough, some dizziness, and fatigue, no fever  - says symptoms started last night. BP was 136/54. Denies chest pain. Pt is post CABG - home health's last visit to pt will be next week. Will forward to Dr. Harl Bowie covering for Dr. Bronson Ing

## 2015-06-15 NOTE — Addendum Note (Signed)
Addended by: Julian Hy T on: 06/15/2015 05:02 PM   Modules accepted: Orders, Medications

## 2015-06-15 NOTE — Telephone Encounter (Signed)
Closed in error.

## 2015-06-18 DIAGNOSIS — Z48812 Encounter for surgical aftercare following surgery on the circulatory system: Secondary | ICD-10-CM | POA: Diagnosis not present

## 2015-06-18 DIAGNOSIS — I739 Peripheral vascular disease, unspecified: Secondary | ICD-10-CM | POA: Diagnosis not present

## 2015-06-18 DIAGNOSIS — I2510 Atherosclerotic heart disease of native coronary artery without angina pectoris: Secondary | ICD-10-CM | POA: Diagnosis not present

## 2015-06-18 DIAGNOSIS — I10 Essential (primary) hypertension: Secondary | ICD-10-CM | POA: Diagnosis not present

## 2015-06-18 DIAGNOSIS — J449 Chronic obstructive pulmonary disease, unspecified: Secondary | ICD-10-CM | POA: Diagnosis not present

## 2015-06-18 DIAGNOSIS — I214 Non-ST elevation (NSTEMI) myocardial infarction: Secondary | ICD-10-CM | POA: Diagnosis not present

## 2015-06-19 DIAGNOSIS — J449 Chronic obstructive pulmonary disease, unspecified: Secondary | ICD-10-CM | POA: Diagnosis not present

## 2015-06-19 DIAGNOSIS — Z48812 Encounter for surgical aftercare following surgery on the circulatory system: Secondary | ICD-10-CM | POA: Diagnosis not present

## 2015-06-19 DIAGNOSIS — I739 Peripheral vascular disease, unspecified: Secondary | ICD-10-CM | POA: Diagnosis not present

## 2015-06-19 DIAGNOSIS — I2510 Atherosclerotic heart disease of native coronary artery without angina pectoris: Secondary | ICD-10-CM | POA: Diagnosis not present

## 2015-06-19 DIAGNOSIS — I10 Essential (primary) hypertension: Secondary | ICD-10-CM | POA: Diagnosis not present

## 2015-06-19 DIAGNOSIS — I214 Non-ST elevation (NSTEMI) myocardial infarction: Secondary | ICD-10-CM | POA: Diagnosis not present

## 2015-06-20 ENCOUNTER — Other Ambulatory Visit: Payer: Self-pay | Admitting: Cardiothoracic Surgery

## 2015-06-20 DIAGNOSIS — Z951 Presence of aortocoronary bypass graft: Secondary | ICD-10-CM

## 2015-06-21 ENCOUNTER — Ambulatory Visit (INDEPENDENT_AMBULATORY_CARE_PROVIDER_SITE_OTHER): Payer: Self-pay | Admitting: Cardiothoracic Surgery

## 2015-06-21 ENCOUNTER — Ambulatory Visit
Admission: RE | Admit: 2015-06-21 | Discharge: 2015-06-21 | Disposition: A | Discharge status: Home / Self Care | Payer: Medicare Other | Source: Ambulatory Visit | Attending: Cardiothoracic Surgery | Admitting: Cardiothoracic Surgery

## 2015-06-21 ENCOUNTER — Encounter: Payer: Self-pay | Admitting: Cardiothoracic Surgery

## 2015-06-21 VITALS — BP 150/70 | HR 76 | Resp 16 | Ht 66.0 in | Wt 150.0 lb

## 2015-06-21 DIAGNOSIS — Z951 Presence of aortocoronary bypass graft: Secondary | ICD-10-CM

## 2015-06-21 DIAGNOSIS — I25110 Atherosclerotic heart disease of native coronary artery with unstable angina pectoris: Secondary | ICD-10-CM

## 2015-06-21 DIAGNOSIS — I739 Peripheral vascular disease, unspecified: Secondary | ICD-10-CM | POA: Diagnosis not present

## 2015-06-21 DIAGNOSIS — Z48812 Encounter for surgical aftercare following surgery on the circulatory system: Secondary | ICD-10-CM | POA: Diagnosis not present

## 2015-06-21 DIAGNOSIS — J449 Chronic obstructive pulmonary disease, unspecified: Secondary | ICD-10-CM | POA: Diagnosis not present

## 2015-06-21 DIAGNOSIS — I214 Non-ST elevation (NSTEMI) myocardial infarction: Secondary | ICD-10-CM | POA: Diagnosis not present

## 2015-06-21 DIAGNOSIS — J90 Pleural effusion, not elsewhere classified: Secondary | ICD-10-CM | POA: Diagnosis not present

## 2015-06-21 DIAGNOSIS — I2510 Atherosclerotic heart disease of native coronary artery without angina pectoris: Secondary | ICD-10-CM | POA: Diagnosis not present

## 2015-06-21 DIAGNOSIS — I10 Essential (primary) hypertension: Secondary | ICD-10-CM | POA: Diagnosis not present

## 2015-06-21 NOTE — Progress Notes (Signed)
[address]       [place] [address]             [phone]      Clifton D Benedicto Hatton Medical Record [id number] Date of Birth: 05/22/1940  Referring: Acie Fredrickson Wonda Cheng, MD Primary Care: No primary care provider on file.  Chief Complaint:   POST OP FOLLOW UP 05/22/2015  OPERATIVE REPORT POSTOPERATIVE DIAGNOSES: Critical left main obstruction and coronary occlusive disease. 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 SURGICAL PROCEDURE: Coronary artery bypass grafting x2 with the left internal mammary to the left anterior descending coronary artery, and reverse saphenous vein graft to the first obtuse marginal coronary artery with right thigh endo vein greater saphenous harvesting. SURGEON: Lanelle Bal, MD  History of Present Illness:     Patient doing well following recent emergency coronary artery bypass grafting for critical left main disease. He is making good progress postoperatively, he's had no recurrent angina. He's no longer smoking.      Past Medical History  Diagnosis Date  . Hypertension   . Hyperlipidemia   . Peripheral arterial disease ([place])   . Diabetes mellitus     Type II  . Colon polyp   . Carotid artery occlusion   . 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 COPD (chronic obstructive pulmonary disease) ([facility])      History  Smoking status  . Former Smoker -- 2.00 packs/day  . Types: Cigarettes  . Quit date: 11/17/1991  Smokeless tobacco  . Never Used    History  Alcohol Use  . 1.2 - 2.4 oz/week  . 2-4 Standard drinks or equivalent per week     Allergies  Allergen Reactions  . Lipitor [Atorvastatin] Other (See Comments)    Leg pain  . Zocor [Simvastatin] Other (See Comments)    Leg pain    Current Outpatient Prescriptions  Medication Sig Dispense Refill  . Ascorbic Acid (VITAMIN C) 1000 MG tablet Take 1,000 mg by mouth daily.    Marland Kitchen aspirin EC 325 MG EC tablet  Take 1 tablet (325 mg total) by mouth daily.    Marland Kitchen docusate sodium (COLACE) 100 MG capsule Take 100 mg by mouth daily as needed for mild constipation.    . fish oil-omega-3 fatty acids 1000 MG capsule Take 1,000 g by mouth 2 (two) times daily.    Marland Kitchen gabapentin (NEURONTIN) 300 MG capsule Take 600 mg by mouth every evening.     . Garlic [id number] MG TABS Take 1,250 mg by mouth daily.     Marland Kitchen lisinopril (PRINIVIL,ZESTRIL) 5 MG tablet Take 1 tablet (5 mg total) by mouth daily. 30 tablet 6  . metoprolol tartrate (LOPRESSOR) 25 MG tablet Take 1 tablet (25 mg total) by mouth 2 (two) times daily. 60 tablet 3  . Multiple Vitamin (MULTIVITAMIN) tablet Take 1 tablet by mouth daily.    . rosuvastatin (CRESTOR) 10 MG tablet Take 1 tablet (10 mg total) by mouth daily. 90 tablet 3  . SPIRIVA HANDIHALER 18 MCG inhalation capsule Place 18 mcg into inhaler and inhale daily.   8  . thiamine (VITAMIN B-1) 100 MG tablet Take 100 mg by mouth daily.    . vitamin E 400 UNIT capsule Take 400 Units by mouth daily.     No current facility-administered medications for this visit.       Physical Exam: BP 150/70 mmHg  Pulse 76  Resp 16  Ht 5\' 6"  (  1.676 m)  Wt 150 lb (68.04 kg)  BMI 24.22 kg/m2  SpO2 98%  General appearance: alert, cooperative, appears older than stated age and no distress Neurologic: intact Heart: regular rate and rhythm, S1, S2 normal, no murmur, click, rub or gallop Lungs: clear to auscultation bilaterally Abdomen: soft, non-tender; bowel sounds normal; no masses,  no organomegaly Extremities: extremities normal, atraumatic, no cyanosis or edema and Homans sign is negative, no sign of DVT Wound:  Patient sternum is stable well healed   Diagnostic Studies & Laboratory data:     Recent Radiology Findings:   Dg Chest 2 View  06/21/2015  CLINICAL DATA:  CABG. EXAM: CHEST  2 VIEW COMPARISON:  05/26/2015. FINDINGS: Prior CABG. Stable cardiomegaly. No pulmonary venous congestion. Interim clearing of  basilar infiltrates and/or edema. Mild residual basilar atelectasis. Tiny solitary nodular opacities projected over the lung bases on PA view only consistent with nipple shadows. Small left pleural effusion. No pneumothorax. IMPRESSION: 1. Prior CABG. Stable cardiomegaly. No pulmonary venous congestion. 2. Interim clearing of basilar infiltrates. Mild residual subsegmental atelectasis. Small left pleural effusion. Electronically Signed   By: Marcello Moores  Register   On: 06/21/2015 13:16      Recent Lab Findings: Lab Results  Component Value Date   WBC 8.8 05/26/2015   HGB 9.2* 05/26/2015   HCT 27.4* 05/26/2015   PLT 288 05/26/2015   GLUCOSE 101* 05/29/2015   CHOL 137 05/19/2015   TRIG 74 05/19/2015   HDL 33* 05/19/2015   LDLCALC 89 05/19/2015   ALT 23 05/19/2015   AST 103* 05/19/2015   NA 132* 05/29/2015   K 4.2 05/29/2015   CL 93* 05/29/2015   CREATININE 0.95 05/29/2015   BUN 23* 05/29/2015   CO2 27 05/29/2015   TSH 2.894 05/19/2015   INR 1.74* 05/22/2015   HGBA1C 6.0* 05/21/2015      Assessment / Plan:     Stable after recent coronary artery bypass grafting, 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

## 2015-06-22 ENCOUNTER — Encounter: Payer: Self-pay | Admitting: Family

## 2015-06-22 DIAGNOSIS — I10 Essential (primary) hypertension: Secondary | ICD-10-CM | POA: Diagnosis not present

## 2015-06-22 DIAGNOSIS — J449 Chronic obstructive pulmonary disease, unspecified: Secondary | ICD-10-CM | POA: Diagnosis not present

## 2015-06-22 DIAGNOSIS — I214 Non-ST elevation (NSTEMI) myocardial infarction: Secondary | ICD-10-CM | POA: Diagnosis not present

## 2015-06-22 DIAGNOSIS — I739 Peripheral vascular disease, unspecified: Secondary | ICD-10-CM | POA: Diagnosis not present

## 2015-06-22 DIAGNOSIS — I2510 Atherosclerotic heart disease of native coronary artery without angina pectoris: Secondary | ICD-10-CM | POA: Diagnosis not present

## 2015-06-22 DIAGNOSIS — Z48812 Encounter for surgical aftercare following surgery on the circulatory system: Secondary | ICD-10-CM | POA: Diagnosis not present

## 2015-06-25 ENCOUNTER — Ambulatory Visit (INDEPENDENT_AMBULATORY_CARE_PROVIDER_SITE_OTHER)
Admission: RE | Admit: 2015-06-25 | Discharge: 2015-06-25 | Disposition: A | Discharge status: Home / Self Care | Payer: Medicare Other | Source: Ambulatory Visit | Attending: Family | Admitting: Family

## 2015-06-25 ENCOUNTER — Ambulatory Visit (INDEPENDENT_AMBULATORY_CARE_PROVIDER_SITE_OTHER): Payer: Medicare Other | Admitting: Family

## 2015-06-25 ENCOUNTER — Other Ambulatory Visit: Payer: Self-pay | Admitting: *Deleted

## 2015-06-25 ENCOUNTER — Encounter: Payer: Self-pay | Admitting: Family

## 2015-06-25 ENCOUNTER — Ambulatory Visit (HOSPITAL_COMMUNITY)
Admission: RE | Admit: 2015-06-25 | Discharge: 2015-06-25 | Disposition: A | Discharge status: Home / Self Care | Payer: Medicare Other | Source: Ambulatory Visit | Attending: Family | Admitting: Family

## 2015-06-25 VITALS — BP 150/70 | HR 86 | Temp 96.9°F | Resp 18 | Ht 64.0 in | Wt 151.3 lb

## 2015-06-25 DIAGNOSIS — E785 Hyperlipidemia, unspecified: Secondary | ICD-10-CM | POA: Diagnosis not present

## 2015-06-25 DIAGNOSIS — I6523 Occlusion and stenosis of bilateral carotid arteries: Secondary | ICD-10-CM

## 2015-06-25 DIAGNOSIS — J449 Chronic obstructive pulmonary disease, unspecified: Secondary | ICD-10-CM | POA: Diagnosis not present

## 2015-06-25 DIAGNOSIS — Z87891 Personal history of nicotine dependence: Secondary | ICD-10-CM

## 2015-06-25 DIAGNOSIS — I739 Peripheral vascular disease, unspecified: Secondary | ICD-10-CM | POA: Diagnosis not present

## 2015-06-25 DIAGNOSIS — R938 Abnormal findings on diagnostic imaging of other specified body structures: Secondary | ICD-10-CM | POA: Insufficient documentation

## 2015-06-25 DIAGNOSIS — Z95828 Presence of other vascular implants and grafts: Secondary | ICD-10-CM | POA: Insufficient documentation

## 2015-06-25 DIAGNOSIS — I252 Old myocardial infarction: Secondary | ICD-10-CM | POA: Insufficient documentation

## 2015-06-25 DIAGNOSIS — I70213 Atherosclerosis of native arteries of extremities with intermittent claudication, bilateral legs: Secondary | ICD-10-CM

## 2015-06-25 DIAGNOSIS — I10 Essential (primary) hypertension: Secondary | ICD-10-CM | POA: Insufficient documentation

## 2015-06-25 DIAGNOSIS — I2510 Atherosclerotic heart disease of native coronary artery without angina pectoris: Secondary | ICD-10-CM | POA: Diagnosis not present

## 2015-06-25 DIAGNOSIS — R0989 Other specified symptoms and signs involving the circulatory and respiratory systems: Secondary | ICD-10-CM | POA: Diagnosis present

## 2015-06-25 DIAGNOSIS — E119 Type 2 diabetes mellitus without complications: Secondary | ICD-10-CM | POA: Insufficient documentation

## 2015-06-25 NOTE — Progress Notes (Signed)
Filed Vitals:   06/25/15 1125 06/25/15 1130  BP: 181/79 150/70  Pulse: 86   Temp: 96.9 F (36.1 C)   Resp: 18   Height: 5\' 4"  (1.626 m)   Weight: 151 lb 4.8 oz (68.629 kg)   SpO2: 98%

## 2015-06-25 NOTE — Progress Notes (Signed)
[facility] [place] [facility]ICAL   MRN : [id number]  History of Present Illness:   William [name] is a 76 y.o. male patient of Dr. Scot Dock who is status post right external iliac artery stent placement in January 2011.  He also has bilateral extracranial carotid artery stenosis.  He returns today for follow up.  Pt was having claudication pain in both lower legs after walking about 50 feet before his CABG in February 2017, has not been walking much since then.  He denies non healing wounds.  He denies any history of stroke or TIA, denies steal symptoms in either arm. He reports light headedness on standing since the CABG, usually just once/day in the morning getting OOB.  The patient reports that Dr. Brigitte Pulse thinks that pt had [facility] spotted fever the Summer of 2015 for about a month, treated with antibiotics.   Pt reports that he had a 4 vessel CABG in February 2017 after an MI; he will start cardiac rehab in April 2017. Vein in right leg and mammary artery were harvested for grafting per pt.  Pt Diabetic: No  Pt smoker: former smoker, quit in the 1990's   Pt meds include:  Statin :Yes  ASA: Yes  Other anticoagulants/antiplatelets: no     Current Outpatient Prescriptions  Medication Sig Dispense Refill  . Ascorbic Acid (VITAMIN C) 1000 MG tablet Take 1,000 mg by mouth daily.    Marland Kitchen aspirin EC 325 MG EC tablet Take 1 tablet (325 mg total) by mouth daily.    Marland Kitchen docusate sodium (COLACE) 100 MG capsule Take 100 mg by mouth daily as needed for mild constipation.    . fish oil-omega-3 fatty acids 1000 MG capsule Take 1,000 g by mouth 2 (two) times daily.    Marland Kitchen gabapentin (NEURONTIN) 300 MG capsule Take 600 mg by mouth every evening.     . Garlic [id number] MG TABS Take 1,250 mg by mouth daily.     Marland Kitchen lisinopril (PRINIVIL,ZESTRIL) 5 MG tablet Take 1 tablet (5 mg total) by mouth daily. 30 tablet 6  . metoprolol tartrate (LOPRESSOR) 25 MG tablet Take 1  tablet (25 mg total) by mouth 2 (two) times daily. 60 tablet 3  . Multiple Vitamin (MULTIVITAMIN) tablet Take 1 tablet by mouth daily.    . rosuvastatin (CRESTOR) 10 MG tablet Take 1 tablet (10 mg total) by mouth daily. 90 tablet 3  . SPIRIVA HANDIHALER 18 MCG inhalation capsule Place 18 mcg into inhaler and inhale daily.   8  . thiamine (VITAMIN B-1) 100 MG tablet Take 100 mg by mouth daily.    . vitamin E 400 UNIT capsule Take 400 Units by mouth daily.     No current facility-administered medications for this visit.    Past Medical History  Diagnosis Date  . Hypertension   . Hyperlipidemia   . Peripheral arterial disease ([place])   . Diabetes mellitus     Type II  . Colon polyp   . Carotid artery occlusion   . 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 COPD (chronic obstructive pulmonary disease) ([place])   . Myocardial infarction [facility])     Social History Social History  Substance Use Topics  . Smoking status: Former Smoker -- 2.00 packs/day    Types: Cigarettes    Quit date: 11/17/1991  . Smokeless tobacco: Never Used  . Alcohol Use: No    Family History Family History  Problem Relation Age of Onset  .  Heart disease Father     Heart Disease before age 80  . Pulmonary embolism Father   . Deep vein thrombosis Father   . Cancer Mother     Sarcoma    Surgical History Past Surgical History  Procedure Laterality Date  . Hiatal hernia repair    . Percutaneous translumnial angioplasty  04/23/2009    Catheterization of Lefst external iliac artery with Left lower extremity runoff  . Bilateral renal  artery stenoses  04/23/2009  . Cardiac catheterization N/A 05/21/2015    Procedure: Left Heart Cath and Coronary Angiography;  Surgeon: Lorretta Harp, MD;  Location: [place] [facility];  Service: Cardiovascular;  Laterality: N/A;  . Coronary artery bypass graft N/A 05/22/2015    Procedure: CORONARY ARTERY BYPASS GRAFTING (CABG) LIMA-LAD and SVG-OM EVH RIGHT THIGH GREATER SAPHENOUS VEIN;   Surgeon: [name], MD;  Location: [place];  Service: Open Heart Surgery;  Laterality: N/A;  . Tee without cardioversion N/A 05/22/2015    Procedure: TRANSESOPHAGEAL ECHOCARDIOGRAM (TEE);  Surgeon: [name], MD;  Location: [place];  Service: Open Heart Surgery;  Laterality: N/A;    Allergies  Allergen Reactions  . Lipitor [Atorvastatin] Other (See Comments)    Leg pain  . Zocor [Simvastatin] Other (See Comments)    Leg pain    Current Outpatient Prescriptions  Medication Sig Dispense Refill  . Ascorbic Acid (VITAMIN C) 1000 MG tablet Take 1,000 mg by mouth daily.    Marland Kitchen aspirin EC 325 MG EC tablet Take 1 tablet (325 mg total) by mouth daily.    Marland Kitchen docusate sodium (COLACE) 100 MG capsule Take 100 mg by mouth daily as needed for mild constipation.    . fish oil-omega-3 fatty acids 1000 MG capsule Take 1,000 g by mouth 2 (two) times daily.    Marland Kitchen gabapentin (NEURONTIN) 300 MG capsule Take 600 mg by mouth every evening.     . Garlic [id number] MG TABS Take 1,250 mg by mouth daily.     Marland Kitchen lisinopril (PRINIVIL,ZESTRIL) 5 MG tablet Take 1 tablet (5 mg total) by mouth daily. 30 tablet 6  . metoprolol tartrate (LOPRESSOR) 25 MG tablet Take 1 tablet (25 mg total) by mouth 2 (two) times daily. 60 tablet 3  . Multiple Vitamin (MULTIVITAMIN) tablet Take 1 tablet by mouth daily.    . rosuvastatin (CRESTOR) 10 MG tablet Take 1 tablet (10 mg total) by mouth daily. 90 tablet 3  . SPIRIVA HANDIHALER 18 MCG inhalation capsule Place 18 mcg into inhaler and inhale daily.   8  . thiamine (VITAMIN B-1) 100 MG tablet Take 100 mg by mouth daily.    . vitamin E 400 UNIT capsule Take 400 Units by mouth daily.     No current facility-administered medications for this visit.     REVIEW OF SYSTEMS: See HPI for pertinent positives and negatives.  Physical Examination Filed Vitals:   06/25/15 1125 06/25/15 1130  BP: 181/79 150/70  Pulse: 86   Temp: 96.9 F (36.1 C)   Resp: 18   Height: 5\' 4"  (1.626 m)    Weight: 151 lb 4.8 oz (68.629 kg)   SpO2: 98%    Body mass index is 25.96 kg/(m^2).  General: A&O x 3, WDWN. Gait: normal Eyes: PERRLA. Pulmonary: CTAB, without wheezes , rales or rhonchi. Cardiac: regular rhythm, no detected murmur.     Carotid Bruits Left Right   positive positive  Aorta is not palpable. Radial pulses: 3+ right, 2+ Left palpable  VASCULAR EXAM: Extremities without ischemic changes  without Gangrene; without open wounds.     LE Pulses LEFT RIGHT   FEMORAL  palpable palpable    POPLITEAL not palpable  not palpable   POSTERIOR TIBIAL not palpable  not palpable    DORSALIS PEDIS  ANTERIOR TIBIAL not palpable   palpable    Abdomen: soft, NT, no palpable masses. Skin: no rashes, no ulcers. Musculoskeletal: no muscle wasting or atrophy. Neurologic: A&O X 3; Appropriate Affect ; SENSATION: normal; MOTOR FUNCTION: moving all extremities equally, motor strength 4/5 in upper extremities, 5/5 in lower extremities. Speech is fluent/normal. CN 2-12 intact.                       Non-Invasive Vascular Imaging (06/25/2015):  Carotid Duplex: >50% bilateral ECA stenosis Retrograde left vertebral artery and monophasic left proximal subclavian artery flow patterns noted with a greater than 20 mm Hg difference in brachial pressures. 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 60-79% right ICA stenosis and 40-59% left ICA stenosis. No significant change compared to the previous exam on 01/17/15.  ABI   R: 0.36 (0.34, 01/17/15), DP: mono, PT: mono, TBI: 0.17  L: 0.47 (0.54), DP: not detected, PT: mono, peroneal: not detected, TBI: 0.32   ASSESSMENT:  William [name]  is a 75 y.o. male who is status post right external iliac artery stent placement in January 2011. He has moderate to severe claudication in both lower legs with walking, no rest pain, no tissue loss. Pt was having claudication pain in both lower legs after walking about 50 feet before his CABG in February 2017, has not been walking much since then. He also has bilateral carotid artery stenosis with no history of stroke or TIA.  Today's carotid duplex suggests >50% bilateral ECA stenosis Retrograde left vertebral artery and monophasic left proximal subclavian artery flow patterns noted with a greater than 20 mm Hg difference in brachial pressures. 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 60-79% right ICA stenosis and 40-59% left ICA stenosis. No significant change compared to the previous exam on 01/17/15.   ABI's remain stable with severely decreased arterial perfusion on both lower extremities.   PLAN:   Graduated walking program if his cardiologist agrees.  Based on today's exam and non-invasive vascular lab results, the patient will follow up in 6 months with the following tests: ABI's and carotid duplex.  I discussed in depth with the patient the nature of atherosclerosis, and emphasized the importance of maximal medical management including strict control of blood pressure, blood glucose, and lipid levels, obtaining regular exercise, and cessation of smoking.  The patient is aware that without maximal medical management the underlying atherosclerotic disease process will progress, limiting the benefit of any interventions.  The patient was given information about stroke prevention and what symptoms should prompt the patient to seek immediate medical care.  The patient was given information about PAD including signs, symptoms, treatment, what symptoms should prompt the patient to seek immediate medical care, and risk reduction measures to take.  Thank you for allowing  [place] to participate in this patient's care.  Clemon Chambers, RN, MSN, FNP-C [facility] Office: [phone]  Clinic MD: Trula Slade  06/25/2015 11:54 AM

## 2015-06-25 NOTE — Patient Instructions (Signed)
Stroke Prevention Some medical conditions and behaviors are associated with an increased chance of having a stroke. You may prevent a stroke by making healthy choices and managing medical conditions. HOW CAN I REDUCE MY RISK OF HAVING A STROKE?   Stay physically active. Get at least 30 minutes of activity on most or all days.  Do not smoke. It may also be helpful to avoid exposure to secondhand smoke.  Limit alcohol use. Moderate alcohol use is considered to be:  No more than 2 drinks per day for men.  No more than 1 drink per day for nonpregnant women.  Eat healthy foods. This involves:  Eating 5 or more servings of fruits and vegetables a day.  Making dietary changes that address high blood pressure (hypertension), high cholesterol, diabetes, or obesity.  Manage your cholesterol levels.  Making food choices that are high in fiber and low in saturated fat, trans fat, and cholesterol may control cholesterol levels.  Take any prescribed medicines to control cholesterol as directed by your health care provider.  Manage your diabetes.  Controlling your carbohydrate and sugar intake is recommended to manage diabetes.  Take any prescribed medicines to control diabetes as directed by your health care provider.  Control your hypertension.  Making food choices that are low in salt (sodium), saturated fat, trans fat, and cholesterol is recommended to manage hypertension.  Ask your health care provider if you need treatment to lower your blood pressure. Take any prescribed medicines to control hypertension as directed by your health care provider.  If you are 18-39 years of age, have your blood pressure checked every 3-5 years. If you are 40 years of age or older, have your blood pressure checked every year.  Maintain a healthy weight.  Reducing calorie intake and making food choices that are low in sodium, saturated fat, trans fat, and cholesterol are recommended to manage  weight.  Stop drug abuse.  Avoid taking birth control pills.  Talk to your health care provider about the risks of taking birth control pills if you are over 35 years old, smoke, get migraines, or have ever had a blood clot.  Get evaluated for sleep disorders (sleep apnea).  Talk to your health care provider about getting a sleep evaluation if you snore a lot or have excessive sleepiness.  Take medicines only as directed by your health care provider.  For some people, aspirin or blood thinners (anticoagulants) are helpful in reducing the risk of forming abnormal blood clots that can lead to stroke. If you have the irregular heart rhythm of atrial fibrillation, you should be on a blood thinner unless there is a good reason you cannot take them.  Understand all your medicine instructions.  Make sure that other conditions (such as anemia or atherosclerosis) are addressed. SEEK IMMEDIATE MEDICAL CARE IF:   You have sudden weakness or numbness of the face, arm, or leg, especially on one side of the body.  Your face or eyelid droops to one side.  You have sudden confusion.  You have trouble speaking (aphasia) or understanding.  You have sudden trouble seeing in one or both eyes.  You have sudden trouble walking.  You have dizziness.  You have a loss of balance or coordination.  You have a sudden, severe headache with no known cause.  You have new chest pain or an irregular heartbeat. Any of these symptoms may represent a serious problem that is an emergency. Do not wait to see if the symptoms will   go away. Get medical help at once. Call your local emergency services (911 in [place] Do not drive yourself to the hospital.   This information is not intended to replace advice given to you by your health care provider. Make sure you discuss any questions you have with your health care provider.   Document Released: 04/24/2004 Document Revised: 04/07/2014 Document Reviewed:  09/17/2012 Elsevier Interactive Patient Education 2016 [employer] Inc[employer]    Peripheral Vascular Disease Peripheral vascular disease (PVD) is a disease of the blood vessels that are not part of your heart and brain. A simple term for PVD is poor circulation. In most cases, PVD narrows the blood vessels that carry blood from your heart to the rest of your body. This can result in a decreased supply of blood to your arms, legs, and internal organs, like your stomach or kidneys. However, it most often affects a person's lower legs and feet. There are two types of PVD.  Organic PVD. This is the more common type. It is caused by damage to the structure of blood vessels.  Functional PVD. This is caused by conditions that make blood vessels contract and tighten (spasm). Without treatment, PVD tends to get worse over time. PVD can also lead to acute ischemic limb. This is when an arm or limb suddenly has trouble getting enough blood. This is a medical emergency. CAUSES Each type of PVD has many different causes. The most common cause of PVD is buildup of a fatty material (plaque) inside of your arteries (atherosclerosis). Small amounts of plaque can break off from the walls of the blood vessels and become lodged in a smaller artery. This blocks blood flow and can cause acute ischemic limb. Other common causes of PVD include:  Blood clots that form inside of blood vessels.  Injuries to blood vessels.  Diseases that cause inflammation of blood vessels or cause blood vessel spasms.  Health behaviors and health history that increase your risk of developing PVD. RISK FACTORS  You may have a greater risk of PVD if you:  Have a family history of PVD.  Have certain medical conditions, including:  High cholesterol.  Diabetes.  High blood pressure (hypertension).  Coronary heart disease.  Past problems with blood clots.  Past injury, such as burns or a broken bone. These may have damaged blood  vessels in your limbs.  Buerger disease. This is caused by inflamed blood vessels in your hands and feet.  Some forms of arthritis.  Rare birth defects that affect the arteries in your legs.  Use tobacco.  Do not get enough exercise.  Are obese.  Are age 50 or older. SIGNS AND SYMPTOMS  PVD may cause many different symptoms. Your symptoms depend on what part of your body is not getting enough blood. Some common signs and symptoms include:  Cramps in your lower legs. This may be a symptom of poor leg circulation (claudication).  Pain and weakness in your legs while you are physically active that goes away when you rest (intermittent claudication).  Leg pain when at rest.  Leg numbness, tingling, or weakness.  Coldness in a leg or foot, especially when compared with the other leg.  Skin or hair changes. These can include:  Hair loss.  Shiny skin.  Pale or bluish skin.  Thick toenails.  Inability to get or maintain an erection (erectile dysfunction). People with PVD are more prone to developing ulcers and sores on their toes, feet, or legs. These may take longer than   normal to heal. DIAGNOSIS Your health care provider may diagnose PVD from your signs and symptoms. The health care provider will also do a physical exam. You may have tests to find out what is causing your PVD and determine its severity. Tests may include:  Blood pressure recordings from your arms and legs and measurements of the strength of your pulses (pulse volume recordings).  Imaging studies using sound waves to take pictures of the blood flow through your blood vessels (Doppler ultrasound).  Injecting a dye into your blood vessels before having imaging studies using:  X-rays (angiogram or arteriogram).  Computer-generated X-rays (CT angiogram).  A powerful electromagnetic field and a computer (magnetic resonance angiogram or MRA). TREATMENT Treatment for PVD depends on the cause of your condition  and the severity of your symptoms. It also depends on your age. Underlying causes need to be treated and controlled. These include long-lasting (chronic) conditions, such as diabetes, high cholesterol, and high blood pressure. You may need to first try making lifestyle changes and taking medicines. Surgery may be needed if these do not work. Lifestyle changes may include:  Quitting smoking.  Exercising regularly.  Following a low-fat, low-cholesterol diet. Medicines may include:  Blood thinners to prevent blood clots.  Medicines to improve blood flow.  Medicines to improve your blood cholesterol levels. Surgical procedures may include:  A procedure that uses an inflated balloon to open a blocked artery and improve blood flow (angioplasty).  A procedure to put in a tube (stent) to keep a blocked artery open (stent implant).  Surgery to reroute blood flow around a blocked artery (peripheral bypass surgery).  Surgery to remove dead tissue from an infected wound on the affected limb.  Amputation. This is surgical removal of the affected limb. This may be necessary in cases of acute ischemic limb that are not improved through medical or surgical treatments. HOME CARE INSTRUCTIONS  Take medicines only as directed by your health care provider.  Do not use any tobacco products, including cigarettes, chewing tobacco, or electronic cigarettes. If you need help quitting, ask your health care provider.  Lose weight if you are overweight, and maintain a healthy weight as directed by your health care provider.  Eat a diet that is low in fat and cholesterol. If you need help, ask your health care provider.  Exercise regularly. Ask your health care provider to suggest some good activities for you.  Use compression stockings or other mechanical devices as directed by your health care provider.  Take good care of your feet.  Wear comfortable shoes that fit well.  Check your feet often for  any cuts or sores. SEEK MEDICAL CARE IF:  You have cramps in your legs while walking.  You have leg pain when you are at rest.  You have coldness in a leg or foot.  Your skin changes.  You have erectile dysfunction.  You have cuts or sores on your feet that are not healing. SEEK IMMEDIATE MEDICAL CARE IF:  Your arm or leg turns cold and blue.  Your arms or legs become red, warm, swollen, painful, or numb.  You have chest pain or trouble breathing.  You suddenly have weakness in your face, arm, or leg.  You become very confused or lose the ability to speak.  You suddenly have a very bad headache or lose your vision.   This information is not intended to replace advice given to you by your health care provider. Make sure you discuss any questions   you have with your health care provider.   Document Released: 04/24/2004 Document Revised: 04/07/2014 Document Reviewed: 08/25/2013 Elsevier Interactive Patient Education 2016 [employer]vier Inc.  

## 2015-06-26 DIAGNOSIS — J449 Chronic obstructive pulmonary disease, unspecified: Secondary | ICD-10-CM | POA: Diagnosis not present

## 2015-06-26 DIAGNOSIS — I10 Essential (primary) hypertension: Secondary | ICD-10-CM | POA: Diagnosis not present

## 2015-06-26 DIAGNOSIS — Z48812 Encounter for surgical aftercare following surgery on the circulatory system: Secondary | ICD-10-CM | POA: Diagnosis not present

## 2015-06-26 DIAGNOSIS — I739 Peripheral vascular disease, unspecified: Secondary | ICD-10-CM | POA: Diagnosis not present

## 2015-06-26 DIAGNOSIS — I2510 Atherosclerotic heart disease of native coronary artery without angina pectoris: Secondary | ICD-10-CM | POA: Diagnosis not present

## 2015-06-26 DIAGNOSIS — I214 Non-ST elevation (NSTEMI) myocardial infarction: Secondary | ICD-10-CM | POA: Diagnosis not present

## 2015-06-28 DIAGNOSIS — I739 Peripheral vascular disease, unspecified: Secondary | ICD-10-CM | POA: Diagnosis not present

## 2015-06-28 DIAGNOSIS — I10 Essential (primary) hypertension: Secondary | ICD-10-CM | POA: Diagnosis not present

## 2015-06-28 DIAGNOSIS — I2510 Atherosclerotic heart disease of native coronary artery without angina pectoris: Secondary | ICD-10-CM | POA: Diagnosis not present

## 2015-06-28 DIAGNOSIS — Z48812 Encounter for surgical aftercare following surgery on the circulatory system: Secondary | ICD-10-CM | POA: Diagnosis not present

## 2015-06-28 DIAGNOSIS — J449 Chronic obstructive pulmonary disease, unspecified: Secondary | ICD-10-CM | POA: Diagnosis not present

## 2015-06-28 DIAGNOSIS — I214 Non-ST elevation (NSTEMI) myocardial infarction: Secondary | ICD-10-CM | POA: Diagnosis not present

## 2015-07-03 ENCOUNTER — Encounter (HOSPITAL_COMMUNITY): Payer: Self-pay

## 2015-07-03 ENCOUNTER — Encounter (HOSPITAL_COMMUNITY)
Admission: RE | Admit: 2015-07-03 | Discharge: 2015-07-03 | Disposition: A | Discharge status: Home / Self Care | Payer: Medicare Other | Source: Ambulatory Visit | Attending: Cardiovascular Disease | Admitting: Cardiovascular Disease

## 2015-07-03 VITALS — BP 120/70 | HR 87 | Ht 66.0 in | Wt 149.9 lb

## 2015-07-03 DIAGNOSIS — Z951 Presence of aortocoronary bypass graft: Secondary | ICD-10-CM | POA: Insufficient documentation

## 2015-07-03 DIAGNOSIS — I10 Essential (primary) hypertension: Secondary | ICD-10-CM | POA: Insufficient documentation

## 2015-07-03 DIAGNOSIS — I252 Old myocardial infarction: Secondary | ICD-10-CM | POA: Diagnosis not present

## 2015-07-03 DIAGNOSIS — J449 Chronic obstructive pulmonary disease, unspecified: Secondary | ICD-10-CM | POA: Diagnosis not present

## 2015-07-03 DIAGNOSIS — Z7982 Long term (current) use of aspirin: Secondary | ICD-10-CM | POA: Insufficient documentation

## 2015-07-03 DIAGNOSIS — K635 Polyp of colon: Secondary | ICD-10-CM | POA: Insufficient documentation

## 2015-07-03 DIAGNOSIS — I214 Non-ST elevation (NSTEMI) myocardial infarction: Secondary | ICD-10-CM | POA: Insufficient documentation

## 2015-07-03 DIAGNOSIS — E785 Hyperlipidemia, unspecified: Secondary | ICD-10-CM | POA: Diagnosis not present

## 2015-07-03 DIAGNOSIS — I739 Peripheral vascular disease, unspecified: Secondary | ICD-10-CM | POA: Diagnosis not present

## 2015-07-03 DIAGNOSIS — Z87891 Personal history of nicotine dependence: Secondary | ICD-10-CM | POA: Insufficient documentation

## 2015-07-03 DIAGNOSIS — I2510 Atherosclerotic heart disease of native coronary artery without angina pectoris: Secondary | ICD-10-CM | POA: Insufficient documentation

## 2015-07-03 DIAGNOSIS — Z79899 Other long term (current) drug therapy: Secondary | ICD-10-CM | POA: Insufficient documentation

## 2015-07-03 HISTORY — DX: Atherosclerotic heart disease of native coronary artery without angina pectoris: I25.10

## 2015-07-03 NOTE — Patient Instructions (Signed)
Patient arrived for 1st visit/orientation/education at 1420. Patient was referred to CR by Dr. Bronson Ing due to MI (I21.4) and CABG (Z95.1). During orientation advised patient on arrival and appointment times what to wear, what to do before, during and after exercise. Reviewed attendance and class policy. Talked about inclement weather and class consultation policy. Pt is scheduled to return Cardiac Rehab on 07/09/15 at 0930. Pt was advised to come to class 15 minutes before class starts. He was also given instructions on meeting with the dietician and attending the Family Structure classes.  Entrance PHQ 2 score is 1 and PHQ 9 score is 0.   Pt is eager to get started. Patient was able to complete 6 minute walk test with assistance of pushing w/c.  Patient was measured for the equipment. Discussed equipment safety with patient. Took patient pre-anthropometric measurements. Patient finished visit at 1600.  Pt has been told to take their medications 1 hour prior to coming to class.  If the patient is not going to attend class, he has been instructed to call.

## 2015-07-03 NOTE — Progress Notes (Addendum)
Cardiac Individual Treatment Plan  Patient Details  Name: William [name] MRN: [id number] Date of Birth: 1940/12/06 Referring Provider:  Herminio Commons, MD  Initial Encounter Date:       CARDIAC REHAB PHASE II ORIENTATION from 07/03/2015 in [facility]   Date  07/03/15      Visit Diagnosis: NSTEMI (non-ST elevation myocardial infarction) ([place])  S/P CABG x 2  Patient's Home Medications on Admission:  Current outpatient prescriptions:  .  Ascorbic Acid (VITAMIN C) 1000 MG tablet, Take 1,000 mg by mouth daily., Disp: , Rfl:  .  aspirin EC 325 MG EC tablet, Take 1 tablet (325 mg total) by mouth daily., Disp: , Rfl:  .  docusate sodium (COLACE) 100 MG capsule, Take 100 mg by mouth daily as needed for mild constipation., Disp: , Rfl:  .  fish oil-omega-3 fatty acids 1000 MG capsule, Take 1,000 g by mouth 2 (two) times daily., Disp: , Rfl:  .  gabapentin (NEURONTIN) 300 MG capsule, Take 600 mg by mouth every evening. , Disp: , Rfl:  .  Garlic [id number] MG TABS, Take 1,250 mg by mouth daily. , Disp: , Rfl:  .  lisinopril (PRINIVIL,ZESTRIL) 5 MG tablet, Take 1 tablet (5 mg total) by mouth daily., Disp: 30 tablet, Rfl: 6 .  metoprolol tartrate (LOPRESSOR) 25 MG tablet, Take 1 tablet (25 mg total) by mouth 2 (two) times daily., Disp: 60 tablet, Rfl: 3 .  Multiple Vitamin (MULTIVITAMIN) tablet, Take 1 tablet by mouth daily., Disp: , Rfl:  .  rosuvastatin (CRESTOR) 10 MG tablet, Take 1 tablet (10 mg total) by mouth daily., Disp: 90 tablet, Rfl: 3 .  SPIRIVA HANDIHALER 18 MCG inhalation capsule, Place 18 mcg into inhaler and inhale daily. , Disp: , Rfl: 8 .  thiamine (VITAMIN B-1) 100 MG tablet, Take 100 mg by mouth daily., Disp: , Rfl:  .  vitamin E 400 UNIT capsule, Take 400 Units by mouth daily., Disp: , Rfl:   Past Medical History: Past Medical History  Diagnosis Date  . Hypertension   . Hyperlipidemia   . Peripheral arterial disease ([place])   . Colon polyp   . Carotid  artery occlusion   . 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 COPD (chronic obstructive pulmonary disease) ([place])   . Myocardial infarction ([place])   . Coronary artery disease     Tobacco Use: History  Smoking status  . Former Smoker -- 2.00 packs/day  . Types: Cigarettes  . Quit date: 11/17/1991  Smokeless tobacco  . Never Used    Labs:     Recent Review Flowsheet Data    Labs for ITP Cardiac and Pulmonary Rehab Latest Ref Rng 05/22/2015 05/22/2015 05/22/2015 05/22/2015 05/23/2015   PHART 7.350 - 7.450 7.341(L) 7.308(L) - 7.316(L) -   PCO2ART 35.0 - 45.0 mmHg 37.3 38.6 - 35.2 -   HCO3 20.0 - 24.0 mEq/L 20.3 19.3(L) - 17.9(L) -   TCO2 0 - 100 mmol/L 22 20 19 19 23    ACIDBASEDEF 0.0 - 2.0 mmol/L 5.0(H) 6.0(H) - 7.0(H) -   O2SAT - 87.0 89.0 - 90.0 -      Capillary Blood Glucose: Lab Results  Component Value Date   GLUCAP 81 05/29/2015   GLUCAP 90 05/29/2015   GLUCAP 100* 05/28/2015   GLUCAP 92 05/28/2015   GLUCAP 94 05/28/2015     Exercise Target Goals: Date: 07/03/15  Exercise Program Goal: Individual exercise prescription set with THRR, safety & activity barriers. Participant demonstrates  ability to understand and report RPE using BORG scale, to self-measure pulse accurately, and to acknowledge the importance of the exercise prescription.  Exercise Prescription Goal: Starting with aerobic activity 30 plus minutes a day, 3 days per week for initial exercise prescription. Provide home exercise prescription and guidelines that participant acknowledges understanding prior to discharge.  Activity Barriers & Risk Stratification:     Activity Barriers & Cardiac Risk Stratification - 07/03/15 1539    Activity Barriers & Cardiac Risk Stratification   Activity Barriers Deconditioning   Cardiac Risk Stratification High      6 Minute Walk:     6 Minute Walk      07/03/15 1510       6 Minute Walk   Phase Initial     Distance 800 feet     Walk Time 6 minutes     # of Rest  Breaks 0     MPH 1.52     METS 2.17     RPE 11     Perceived Dyspnea  13     VO2 Peak 7.29     Symptoms Yes (comment)     Comments leg cramping rated moderate ( 4 on claudication scale)     Resting HR 87 bpm     Resting BP 120/70 mmHg     Max Ex. HR 102 bpm     Max Ex. BP 142/70 mmHg     2 Minute Post BP 118/60 mmHg        Initial Exercise Prescription:     Initial Exercise Prescription - 07/03/15 1600    Date of Initial Exercise Prescription   Date 07/03/15   Treadmill   MPH 1   Grade 0   Minutes 15   METs 1.77   NuStep   Level 2   Minutes 15   METs 1.5   Arm Ergometer   Level 1.5   Minutes 15   METs 1.5   Prescription Details   Frequency (times per week) 3   Intensity   THRR REST +  30   THRR 40-80% of Max Heartrate 111-134   Ratings of Perceived Exertion 11-13   Progression   Progression Continue to progress workloads to maintain intensity without signs/symptoms of physical distress.   Resistance Training   Training Prescription Yes   Weight 1.0   Reps 10-12      Perform Capillary Blood Glucose checks as needed.  Exercise Prescription Changes:   Exercise Comments:    Discharge Exercise Prescription (Final Exercise Prescription Changes):   Nutrition:  Target Goals: Understanding of nutrition guidelines, daily intake of sodium 1500mg , cholesterol 200mg , calories 30% from fat and 7% or less from saturated fats, daily to have 5 or more servings of fruits and vegetables.  Biometrics:     Pre Biometrics - 07/03/15 1618    Pre Biometrics   Height 5\' 6"  (1.676 m)   Weight 149 lb 14.4 oz (67.994 kg)   Waist Circumference 37 inches   Hip Circumference 35 inches   Waist to Hip Ratio 1.06 %   BMI (Calculated) 24.2   Triceps Skinfold 6 mm   % Body Fat 21.6 %   Grip Strength 59 kg   Flexibility 0 in   Single Leg Stand 30 seconds       Nutrition Therapy Plan and Nutrition Goals:     Nutrition Therapy & Goals - 07/03/15 1540     Intervention Plan   Intervention Nutrition handout(s) given to patient.  Expected Outcomes Short Term Goal: Understand basic principles of dietary content, such as calories, fat, sodium, cholesterol and nutrients.      Nutrition Discharge: Rate Your Plate Scores:     Nutrition Assessments - 07/03/15 1540    MEDFICTS Scores   Pre Score 38      Nutrition Goals Re-Evaluation:   Psychosocial: Target Goals: Acknowledge presence or absence of depression, maximize coping skills, provide positive support system. Participant is able to verbalize types and ability to use techniques and skills needed for reducing stress and depression.  Initial Review & Psychosocial Screening:     Initial Psych Review & Screening - 07/03/15 1542    Initial Review   Current issues with --  none   Family Dynamics   Good Support System? Yes   Barriers   Psychosocial barriers to participate in program There are no identifiable barriers or psychosocial needs.   Screening Interventions   Interventions Encouraged to exercise      Quality of Life Scores:     Quality of Life - 07/03/15 1617    Quality of Life Scores   Health/Function Pre 25.29 %   Socioeconomic Pre 23.75 %   Psych/Spiritual Pre 25 %   Family Pre 30 %   GLOBAL Pre 25.55 %      PHQ-9:     Recent Review Flowsheet Data    Depression screen [facility] 2/9 07/03/2015   Decreased Interest 0   Down, Depressed, Hopeless 1   PHQ - 2 Score 1   Altered sleeping 0   Tired, decreased energy 0   Change in appetite 0   Feeling bad or failure about yourself  0   Trouble concentrating 0   Moving slowly or fidgety/restless 0   Suicidal thoughts 0   PHQ-9 Score 1   Difficult doing work/chores Not difficult at all      Psychosocial Evaluation and Intervention:     Psychosocial Evaluation - 07/03/15 1542    Psychosocial Evaluation & Interventions   Interventions Encouraged to exercise with the program and follow exercise prescription    Continued Psychosocial Services Needed No      Psychosocial Re-Evaluation:   Vocational Rehabilitation: Provide vocational rehab assistance to qualifying candidates.   Vocational Rehab Evaluation & Intervention:     Vocational Rehab - 07/03/15 1539    Initial Vocational Rehab Evaluation & Intervention   Assessment shows need for Vocational Rehabilitation No      Education: Education Goals: Education classes will be provided on a weekly basis, covering required topics. Participant will state understanding/return demonstration of topics presented.  Learning Barriers/Preferences:     Learning Barriers/Preferences - 07/03/15 1539    Learning Barriers/Preferences   Learning Barriers None   Learning Preferences Skilled Demonstration      Education Topics: Hypertension, Hypertension Reduction -Define heart disease and high blood pressure. Discus how high blood pressure affects the body and ways to reduce high blood pressure.   Exercise and Your Heart -Discuss why it is important to exercise, the FITT principles of exercise, normal and abnormal responses to exercise, and how to exercise safely.   Angina -Discuss definition of angina, causes of angina, treatment of angina, and how to decrease risk of having angina.   Cardiac Medications -Review what the following cardiac medications are used for, how they affect the body, and side effects that may occur when taking the medications.  Medications include Aspirin, Beta blockers, calcium channel blockers, ACE Inhibitors, angiotensin receptor blockers, diuretics, digoxin, and antihyperlipidemics.  Congestive Heart Failure -Discuss the definition of CHF, how to live with CHF, the signs and symptoms of CHF, and how keep track of weight and sodium intake.   Heart Disease and Intimacy -Discus the effect sexual activity has on the heart, how changes occur during intimacy as we age, and safety during sexual activity.   Smoking  Cessation / COPD -Discuss different methods to quit smoking, the health benefits of quitting smoking, and the definition of COPD.   Nutrition I: Fats -Discuss the types of cholesterol, what cholesterol does to the heart, and how cholesterol levels can be controlled.   Nutrition II: Labels -Discuss the different components of food labels and how to read food label   Heart Parts and Heart Disease -Discuss the anatomy of the heart, the pathway of blood circulation through the heart, and these are affected by heart disease.   Stress I: Signs and Symptoms -Discuss the causes of stress, how stress may lead to anxiety and depression, and ways to limit stress.   Stress II: Relaxation -Discuss different types of relaxation techniques to limit stress.   Warning Signs of Stroke / TIA -Discuss definition of a stroke, what the signs and symptoms are of a stroke, and how to identify when someone is having stroke.   Knowledge Questionnaire Score:     Knowledge Questionnaire Score - 07/03/15 1547    Knowledge Questionnaire Score   Pre Score 20/24      Core Components/Risk Factors/Patient Goals at Admission:     Personal Goals and Risk Factors at Admission - 07/03/15 1541    Core Components/Risk Factors/Patient Goals on Admission    Weight Management Weight Maintenance   Improve shortness of breath with ADL's Yes   Intervention Provide education, individualized exercise plan and daily activity instruction to help decrease symptoms of SOB with activities of daily living.   Expected Outcomes Short Term: Achieves a reduction of symptoms when performing activities of daily living.   Hypertension Yes   Intervention Monitor prescription use compliance.;Provide education on lifestyle modifcations including regular physical activity/exercise, weight management, moderate sodium restriction and increased consumption of fresh fruit, vegetables, and low fat dairy, alcohol moderation, and smoking  cessation.   Expected Outcomes Long Term: Maintenance of blood pressure at goal levels.   Lipids Yes   Intervention Provide education and support for participant on nutrition & aerobic/resistive exercise along with prescribed medications to achieve LDL 70mg , HDL >40mg .   Expected Outcomes Long Term: Cholesterol controlled with medications as prescribed, with individualized exercise RX and with personalized nutrition plan. Value goals: LDL < 70mg , HDL > 40 mg.      Core Components/Risk Factors/Patient Goals Review:      Goals and Risk Factor Review      07/03/15 1542           Core Components/Risk Factors/Patient Goals Review   Personal Goals Review Improve shortness of breath with ADL's;Hypertension;Lipids          Core Components/Risk Factors/Patient Goals at Discharge (Final Review):      Goals and Risk Factor Review - 07/03/15 1542    Core Components/Risk Factors/Patient Goals Review   Personal Goals Review Improve shortness of breath with ADL's;Hypertension;Lipids      ITP Comments:     ITP Comments      07/03/15 1539           ITP Comments Patient is a 75 year old man who lives alone.  Has strong family support and enjoys fishing, hunting  and farming. Patient has no S/S of depression at this time.          Comments:

## 2015-07-03 NOTE — Progress Notes (Signed)
Cardiac/Pulmonary Rehab Medication Review by a Pharmacist  Does the patient  feel that his/her medications are working for him/her?  yes  Has the patient been experiencing any side effects to the medications prescribed?  no  Does the patient measure his/her own blood pressure or blood glucose at home?  yes   Does the patient have any problems obtaining medications due to transportation or finances?   no  Understanding of regimen: good Understanding of indications: good Potential of compliance: good  Questions asked to Determine Patient Understanding of Medication Regimen:  1. What is the name of the medication?  2. What is the medication used for?  3. When should it be taken?  4. How much should be taken?  5. How will you take it?  6. What side effects should you report?  Understanding Defined as: Excellent: All questions above are correct Good: Questions 1-4 are correct Fair: Questions 1-2 are correct  Poor: 1 or none of the above questions are correct   Pharmacist comments: Pt is not c/o any side effects.  Pt is taking Crestor and tolerating fine despite listed intolerance to Lipitor and Zocor.  Pt's son is a Software engineer and he understands his medicine well.  Pt does check his BP at home but has had problems with some of the meters.  Hart Robinsons A 07/03/2015 2:36 PM

## 2015-07-05 DIAGNOSIS — I2510 Atherosclerotic heart disease of native coronary artery without angina pectoris: Secondary | ICD-10-CM | POA: Diagnosis not present

## 2015-07-05 DIAGNOSIS — I10 Essential (primary) hypertension: Secondary | ICD-10-CM | POA: Diagnosis not present

## 2015-07-09 ENCOUNTER — Encounter (HOSPITAL_COMMUNITY)
Admission: RE | Admit: 2015-07-09 | Discharge: 2015-07-09 | Disposition: A | Discharge status: Home / Self Care | Payer: Medicare Other | Source: Ambulatory Visit | Attending: Cardiovascular Disease | Admitting: Cardiovascular Disease

## 2015-07-09 DIAGNOSIS — E785 Hyperlipidemia, unspecified: Secondary | ICD-10-CM | POA: Diagnosis not present

## 2015-07-09 DIAGNOSIS — J449 Chronic obstructive pulmonary disease, unspecified: Secondary | ICD-10-CM | POA: Diagnosis not present

## 2015-07-09 DIAGNOSIS — Z79899 Other long term (current) drug therapy: Secondary | ICD-10-CM | POA: Diagnosis not present

## 2015-07-09 DIAGNOSIS — Z7982 Long term (current) use of aspirin: Secondary | ICD-10-CM | POA: Diagnosis not present

## 2015-07-09 DIAGNOSIS — I214 Non-ST elevation (NSTEMI) myocardial infarction: Secondary | ICD-10-CM | POA: Diagnosis not present

## 2015-07-09 DIAGNOSIS — Z951 Presence of aortocoronary bypass graft: Secondary | ICD-10-CM | POA: Diagnosis not present

## 2015-07-11 ENCOUNTER — Encounter (HOSPITAL_COMMUNITY)
Admission: RE | Admit: 2015-07-11 | Discharge: 2015-07-11 | Disposition: A | Discharge status: Home / Self Care | Payer: Medicare Other | Source: Ambulatory Visit | Attending: Cardiovascular Disease | Admitting: Cardiovascular Disease

## 2015-07-11 DIAGNOSIS — Z951 Presence of aortocoronary bypass graft: Secondary | ICD-10-CM | POA: Diagnosis not present

## 2015-07-11 DIAGNOSIS — Z79899 Other long term (current) drug therapy: Secondary | ICD-10-CM | POA: Diagnosis not present

## 2015-07-11 DIAGNOSIS — Z7982 Long term (current) use of aspirin: Secondary | ICD-10-CM | POA: Diagnosis not present

## 2015-07-11 DIAGNOSIS — J449 Chronic obstructive pulmonary disease, unspecified: Secondary | ICD-10-CM | POA: Diagnosis not present

## 2015-07-11 DIAGNOSIS — E785 Hyperlipidemia, unspecified: Secondary | ICD-10-CM | POA: Diagnosis not present

## 2015-07-11 DIAGNOSIS — I214 Non-ST elevation (NSTEMI) myocardial infarction: Secondary | ICD-10-CM | POA: Diagnosis not present

## 2015-07-13 ENCOUNTER — Encounter (HOSPITAL_COMMUNITY)
Admission: RE | Admit: 2015-07-13 | Discharge: 2015-07-13 | Disposition: A | Discharge status: Home / Self Care | Payer: Medicare Other | Source: Ambulatory Visit | Attending: Cardiovascular Disease | Admitting: Cardiovascular Disease

## 2015-07-13 DIAGNOSIS — I214 Non-ST elevation (NSTEMI) myocardial infarction: Secondary | ICD-10-CM | POA: Diagnosis not present

## 2015-07-13 DIAGNOSIS — Z79899 Other long term (current) drug therapy: Secondary | ICD-10-CM | POA: Diagnosis not present

## 2015-07-13 DIAGNOSIS — Z951 Presence of aortocoronary bypass graft: Secondary | ICD-10-CM | POA: Diagnosis not present

## 2015-07-13 DIAGNOSIS — J449 Chronic obstructive pulmonary disease, unspecified: Secondary | ICD-10-CM | POA: Diagnosis not present

## 2015-07-13 DIAGNOSIS — E785 Hyperlipidemia, unspecified: Secondary | ICD-10-CM | POA: Diagnosis not present

## 2015-07-13 DIAGNOSIS — Z7982 Long term (current) use of aspirin: Secondary | ICD-10-CM | POA: Diagnosis not present

## 2015-07-16 ENCOUNTER — Encounter (HOSPITAL_COMMUNITY)
Admission: RE | Admit: 2015-07-16 | Discharge: 2015-07-16 | Disposition: A | Discharge status: Home / Self Care | Payer: Medicare Other | Source: Ambulatory Visit | Attending: Cardiovascular Disease | Admitting: Cardiovascular Disease

## 2015-07-16 DIAGNOSIS — E785 Hyperlipidemia, unspecified: Secondary | ICD-10-CM | POA: Diagnosis not present

## 2015-07-16 DIAGNOSIS — Z951 Presence of aortocoronary bypass graft: Secondary | ICD-10-CM | POA: Diagnosis not present

## 2015-07-16 DIAGNOSIS — Z79899 Other long term (current) drug therapy: Secondary | ICD-10-CM | POA: Diagnosis not present

## 2015-07-16 DIAGNOSIS — Z7982 Long term (current) use of aspirin: Secondary | ICD-10-CM | POA: Diagnosis not present

## 2015-07-16 DIAGNOSIS — I214 Non-ST elevation (NSTEMI) myocardial infarction: Secondary | ICD-10-CM | POA: Diagnosis not present

## 2015-07-16 DIAGNOSIS — J449 Chronic obstructive pulmonary disease, unspecified: Secondary | ICD-10-CM | POA: Diagnosis not present

## 2015-07-18 ENCOUNTER — Encounter (HOSPITAL_COMMUNITY)
Admission: RE | Admit: 2015-07-18 | Discharge: 2015-07-18 | Disposition: A | Discharge status: Home / Self Care | Payer: Medicare Other | Source: Ambulatory Visit | Attending: Cardiovascular Disease | Admitting: Cardiovascular Disease

## 2015-07-18 DIAGNOSIS — Z7982 Long term (current) use of aspirin: Secondary | ICD-10-CM | POA: Diagnosis not present

## 2015-07-18 DIAGNOSIS — Z79899 Other long term (current) drug therapy: Secondary | ICD-10-CM | POA: Diagnosis not present

## 2015-07-18 DIAGNOSIS — Z951 Presence of aortocoronary bypass graft: Secondary | ICD-10-CM | POA: Diagnosis not present

## 2015-07-18 DIAGNOSIS — E785 Hyperlipidemia, unspecified: Secondary | ICD-10-CM | POA: Diagnosis not present

## 2015-07-18 DIAGNOSIS — J449 Chronic obstructive pulmonary disease, unspecified: Secondary | ICD-10-CM | POA: Diagnosis not present

## 2015-07-18 DIAGNOSIS — I214 Non-ST elevation (NSTEMI) myocardial infarction: Secondary | ICD-10-CM | POA: Diagnosis not present

## 2015-07-20 ENCOUNTER — Encounter (HOSPITAL_COMMUNITY)
Admission: RE | Admit: 2015-07-20 | Discharge: 2015-07-20 | Disposition: A | Discharge status: Home / Self Care | Payer: Medicare Other | Source: Ambulatory Visit | Attending: Cardiovascular Disease | Admitting: Cardiovascular Disease

## 2015-07-20 DIAGNOSIS — J449 Chronic obstructive pulmonary disease, unspecified: Secondary | ICD-10-CM | POA: Diagnosis not present

## 2015-07-20 DIAGNOSIS — I214 Non-ST elevation (NSTEMI) myocardial infarction: Secondary | ICD-10-CM | POA: Diagnosis not present

## 2015-07-20 DIAGNOSIS — Z951 Presence of aortocoronary bypass graft: Secondary | ICD-10-CM | POA: Diagnosis not present

## 2015-07-20 DIAGNOSIS — Z7982 Long term (current) use of aspirin: Secondary | ICD-10-CM | POA: Diagnosis not present

## 2015-07-20 DIAGNOSIS — Z79899 Other long term (current) drug therapy: Secondary | ICD-10-CM | POA: Diagnosis not present

## 2015-07-20 DIAGNOSIS — E785 Hyperlipidemia, unspecified: Secondary | ICD-10-CM | POA: Diagnosis not present

## 2015-07-23 ENCOUNTER — Encounter (HOSPITAL_COMMUNITY)
Admission: RE | Admit: 2015-07-23 | Discharge: 2015-07-23 | Disposition: A | Discharge status: Home / Self Care | Payer: Medicare Other | Source: Ambulatory Visit | Attending: Cardiovascular Disease | Admitting: Cardiovascular Disease

## 2015-07-23 DIAGNOSIS — Z951 Presence of aortocoronary bypass graft: Secondary | ICD-10-CM | POA: Diagnosis not present

## 2015-07-23 DIAGNOSIS — Z79899 Other long term (current) drug therapy: Secondary | ICD-10-CM | POA: Diagnosis not present

## 2015-07-23 DIAGNOSIS — I214 Non-ST elevation (NSTEMI) myocardial infarction: Secondary | ICD-10-CM | POA: Diagnosis not present

## 2015-07-23 DIAGNOSIS — E785 Hyperlipidemia, unspecified: Secondary | ICD-10-CM | POA: Diagnosis not present

## 2015-07-23 DIAGNOSIS — J449 Chronic obstructive pulmonary disease, unspecified: Secondary | ICD-10-CM | POA: Diagnosis not present

## 2015-07-23 DIAGNOSIS — Z7982 Long term (current) use of aspirin: Secondary | ICD-10-CM | POA: Diagnosis not present

## 2015-07-25 ENCOUNTER — Encounter (HOSPITAL_COMMUNITY): Payer: Medicare Other

## 2015-07-25 ENCOUNTER — Ambulatory Visit: Payer: Medicare Other | Admitting: Family

## 2015-07-25 ENCOUNTER — Encounter (HOSPITAL_COMMUNITY)
Admission: RE | Admit: 2015-07-25 | Discharge: 2015-07-25 | Disposition: A | Discharge status: Home / Self Care | Payer: Medicare Other | Source: Ambulatory Visit | Attending: Cardiovascular Disease | Admitting: Cardiovascular Disease

## 2015-07-25 DIAGNOSIS — Z7982 Long term (current) use of aspirin: Secondary | ICD-10-CM | POA: Diagnosis not present

## 2015-07-25 DIAGNOSIS — E785 Hyperlipidemia, unspecified: Secondary | ICD-10-CM | POA: Diagnosis not present

## 2015-07-25 DIAGNOSIS — Z951 Presence of aortocoronary bypass graft: Secondary | ICD-10-CM | POA: Diagnosis not present

## 2015-07-25 DIAGNOSIS — I214 Non-ST elevation (NSTEMI) myocardial infarction: Secondary | ICD-10-CM | POA: Diagnosis not present

## 2015-07-25 DIAGNOSIS — Z79899 Other long term (current) drug therapy: Secondary | ICD-10-CM | POA: Diagnosis not present

## 2015-07-25 DIAGNOSIS — J449 Chronic obstructive pulmonary disease, unspecified: Secondary | ICD-10-CM | POA: Diagnosis not present

## 2015-07-27 ENCOUNTER — Encounter (HOSPITAL_COMMUNITY)
Admission: RE | Admit: 2015-07-27 | Discharge: 2015-07-27 | Disposition: A | Discharge status: Home / Self Care | Payer: Medicare Other | Source: Ambulatory Visit | Attending: Cardiovascular Disease | Admitting: Cardiovascular Disease

## 2015-07-27 DIAGNOSIS — Z79899 Other long term (current) drug therapy: Secondary | ICD-10-CM | POA: Diagnosis not present

## 2015-07-27 DIAGNOSIS — E785 Hyperlipidemia, unspecified: Secondary | ICD-10-CM | POA: Diagnosis not present

## 2015-07-27 DIAGNOSIS — I214 Non-ST elevation (NSTEMI) myocardial infarction: Secondary | ICD-10-CM | POA: Diagnosis not present

## 2015-07-27 DIAGNOSIS — J449 Chronic obstructive pulmonary disease, unspecified: Secondary | ICD-10-CM | POA: Diagnosis not present

## 2015-07-27 DIAGNOSIS — Z7982 Long term (current) use of aspirin: Secondary | ICD-10-CM | POA: Diagnosis not present

## 2015-07-27 DIAGNOSIS — Z951 Presence of aortocoronary bypass graft: Secondary | ICD-10-CM | POA: Diagnosis not present

## 2015-07-27 NOTE — Progress Notes (Signed)
Cardiac Individual Treatment Plan  Patient Details  Name: William [name] MRN: [id number] Date of Birth: Aug 19, 1940 Referring Provider:    Initial Encounter Date:       CARDIAC REHAB PHASE II ORIENTATION from 07/03/2015 in [place]   Date  07/03/15      Visit Diagnosis: No diagnosis found.  Patient's Home Medications on Admission:  Current outpatient prescriptions:  .  Ascorbic Acid (VITAMIN C) 1000 MG tablet, Take 1,000 mg by mouth daily., Disp: , Rfl:  .  aspirin EC 325 MG EC tablet, Take 1 tablet (325 mg total) by mouth daily., Disp: , Rfl:  .  docusate sodium (COLACE) 100 MG capsule, Take 100 mg by mouth daily as needed for mild constipation., Disp: , Rfl:  .  fish oil-omega-3 fatty acids 1000 MG capsule, Take 1,000 g by mouth 2 (two) times daily., Disp: , Rfl:  .  gabapentin (NEURONTIN) 300 MG capsule, Take 600 mg by mouth every evening. , Disp: , Rfl:  .  Garlic [id number] MG TABS, Take 1,250 mg by mouth daily. , Disp: , Rfl:  .  lisinopril (PRINIVIL,ZESTRIL) 5 MG tablet, Take 1 tablet (5 mg total) by mouth daily., Disp: 30 tablet, Rfl: 6 .  metoprolol tartrate (LOPRESSOR) 25 MG tablet, Take 1 tablet (25 mg total) by mouth 2 (two) times daily., Disp: 60 tablet, Rfl: 3 .  Multiple Vitamin (MULTIVITAMIN) tablet, Take 1 tablet by mouth daily., Disp: , Rfl:  .  rosuvastatin (CRESTOR) 10 MG tablet, Take 1 tablet (10 mg total) by mouth daily., Disp: 90 tablet, Rfl: 3 .  SPIRIVA HANDIHALER 18 MCG inhalation capsule, Place 18 mcg into inhaler and inhale daily. , Disp: , Rfl: 8 .  thiamine (VITAMIN B-1) 100 MG tablet, Take 100 mg by mouth daily., Disp: , Rfl:  .  vitamin E 400 UNIT capsule, Take 400 Units by mouth daily., Disp: , Rfl:   Past Medical History: Past Medical History  Diagnosis Date  . Hypertension   . Hyperlipidemia   . Peripheral arterial disease ([place])   . Colon polyp   . Carotid artery occlusion   . 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 COPD (chronic  obstructive pulmonary disease) ([place])   . Myocardial infarction ([place])   . Coronary artery disease     Tobacco Use: History  Smoking status  . Former Smoker -- 2.00 packs/day  . Types: Cigarettes  . Quit date: 11/17/1991  Smokeless tobacco  . Never Used    Labs:     Recent Review Flowsheet Data    Labs for ITP Cardiac and Pulmonary Rehab Latest Ref Rng 05/22/2015 05/22/2015 05/22/2015 05/22/2015 05/23/2015   PHART 7.350 - 7.450 7.341(L) 7.308(L) - 7.316(L) -   PCO2ART 35.0 - 45.0 mmHg 37.3 38.6 - 35.2 -   HCO3 20.0 - 24.0 mEq/L 20.3 19.3(L) - 17.9(L) -   TCO2 0 - 100 mmol/L 22 20 19 19 23    ACIDBASEDEF 0.0 - 2.0 mmol/L 5.0(H) 6.0(H) - 7.0(H) -   O2SAT - 87.0 89.0 - 90.0 -      Capillary Blood Glucose: Lab Results  Component Value Date   GLUCAP 81 05/29/2015   GLUCAP 90 05/29/2015   GLUCAP 100* 05/28/2015   GLUCAP 92 05/28/2015   GLUCAP 94 05/28/2015     Exercise Target Goals:    Exercise Program Goal: Individual exercise prescription set with THRR, safety & activity barriers. Participant demonstrates ability to understand and report RPE using BORG scale, to self-measure  pulse accurately, and to acknowledge the importance of the exercise prescription.  Exercise Prescription Goal: Starting with aerobic activity 30 plus minutes a day, 3 days per week for initial exercise prescription. Provide home exercise prescription and guidelines that participant acknowledges understanding prior to discharge.  Activity Barriers & Risk Stratification:     Activity Barriers & Cardiac Risk Stratification - 07/03/15 1539    Activity Barriers & Cardiac Risk Stratification   Activity Barriers Deconditioning   Cardiac Risk Stratification High      6 Minute Walk:     6 Minute Walk      07/03/15 1510       6 Minute Walk   Phase Initial     Distance 800 feet     Walk Time 6 minutes     # of Rest Breaks 0     MPH 1.52     METS 2.17     RPE 11     Perceived Dyspnea  13     VO2  Peak 7.29     Symptoms Yes (comment)     Comments leg cramping rated moderate ( 4 on claudication scale)     Resting HR 87 bpm     Resting BP 120/70 mmHg     Max Ex. HR 102 bpm     Max Ex. BP 142/70 mmHg     2 Minute Post BP 118/60 mmHg        Initial Exercise Prescription:     Initial Exercise Prescription - 07/03/15 1600    Date of Initial Exercise RX and Referring Provider   Date 07/03/15   Treadmill   MPH 1   Grade 0   Minutes 15   METs 1.77   NuStep   Level 2   Minutes 15   METs 1.5   Arm Ergometer   Level 1.5   Minutes 15   METs 1.5   Prescription Details   Frequency (times per week) 3   Intensity   THRR REST +  30   THRR 40-80% of Max Heartrate 111-134   Ratings of Perceived Exertion 11-13   Progression   Progression Continue to progress workloads to maintain intensity without signs/symptoms of physical distress.   Resistance Training   Training Prescription Yes   Weight 1.0   Reps 10-12      Perform Capillary Blood Glucose checks as needed.  Exercise Prescription Changes:      Exercise Prescription Changes      07/27/15 1900           Exercise Review   Progression Yes       Response to Exercise   Blood Pressure (Admit) 118/80 mmHg       Blood Pressure (Exercise) 162/62 mmHg       Blood Pressure (Exit) 160/50 mmHg       Heart Rate (Admit) 82 bpm       Heart Rate (Exercise) 104 bpm       Heart Rate (Exit) 83 bpm       Symptoms No       Duration Progress to 30 minutes of continuous aerobic without signs/symptoms of physical distress       Progression   Progression Continue to progress workloads to maintain intensity without signs/symptoms of physical distress.       Resistance Training   Training Prescription Yes       Weight 1.0       Reps 10-12  NuStep   Level 2       Minutes 15       METs 2.6       Arm Ergometer   Level 1.5       Minutes 15       METs 1.5       Home Exercise Plan   Plans to continue exercise at Home        Frequency Add 2 additional days to program exercise sessions.          Exercise Comments:      Exercise Comments      07/27/15 1955           Exercise Comments Patient is progressing appropriately.           Discharge Exercise Prescription (Final Exercise Prescription Changes):     Exercise Prescription Changes - 07/27/15 1900    Exercise Review   Progression Yes   Response to Exercise   Blood Pressure (Admit) 118/80 mmHg   Blood Pressure (Exercise) 162/62 mmHg   Blood Pressure (Exit) 160/50 mmHg   Heart Rate (Admit) 82 bpm   Heart Rate (Exercise) 104 bpm   Heart Rate (Exit) 83 bpm   Symptoms No   Duration Progress to 30 minutes of continuous aerobic without signs/symptoms of physical distress   Progression   Progression Continue to progress workloads to maintain intensity without signs/symptoms of physical distress.   Resistance Training   Training Prescription Yes   Weight 1.0   Reps 10-12   NuStep   Level 2   Minutes 15   METs 2.6   Arm Ergometer   Level 1.5   Minutes 15   METs 1.5   Home Exercise Plan   Plans to continue exercise at Home   Frequency Add 2 additional days to program exercise sessions.      Nutrition:  Target Goals: Understanding of nutrition guidelines, daily intake of sodium 1500mg , cholesterol 200mg , calories 30% from fat and 7% or less from saturated fats, daily to have 5 or more servings of fruits and vegetables.  Biometrics:     Pre Biometrics - 07/03/15 1618    Pre Biometrics   Height 5\' 6"  (1.676 m)   Weight 149 lb 14.4 oz (67.994 kg)   Waist Circumference 37 inches   Hip Circumference 35 inches   Waist to Hip Ratio 1.06 %   BMI (Calculated) 24.2   Triceps Skinfold 6 mm   % Body Fat 21.6 %   Grip Strength 59 kg   Flexibility 0 in   Single Leg Stand 30 seconds       Nutrition Therapy Plan and Nutrition Goals:     Nutrition Therapy & Goals - 07/03/15 1540    Intervention Plan   Intervention Nutrition  handout(s) given to patient.   Expected Outcomes Short Term Goal: Understand basic principles of dietary content, such as calories, fat, sodium, cholesterol and nutrients.      Nutrition Discharge: Rate Your Plate Scores:     Nutrition Assessments - 07/03/15 1540    MEDFICTS Scores   Pre Score 38      Nutrition Goals Re-Evaluation:   Psychosocial: Target Goals: Acknowledge presence or absence of depression, maximize coping skills, provide positive support system. Participant is able to verbalize types and ability to use techniques and skills needed for reducing stress and depression.  Initial Review & Psychosocial Screening:     Initial Psych Review & Screening - 07/03/15 1542    Initial  Review   Current issues with --  none   Family Dynamics   Good Support System? Yes   Barriers   Psychosocial barriers to participate in program There are no identifiable barriers or psychosocial needs.   Screening Interventions   Interventions Encouraged to exercise      Quality of Life Scores:     Quality of Life - 07/03/15 1617    Quality of Life Scores   Health/Function Pre 25.29 %   Socioeconomic Pre 23.75 %   Psych/Spiritual Pre 25 %   Family Pre 30 %   GLOBAL Pre 25.55 %      PHQ-9:     Recent Review Flowsheet Data    Depression screen [facility] 2/9 07/03/2015   Decreased Interest 0   Down, Depressed, Hopeless 1   PHQ - 2 Score 1   Altered sleeping 0   Tired, decreased energy 0   Change in appetite 0   Feeling bad or failure about yourself  0   Trouble concentrating 0   Moving slowly or fidgety/restless 0   Suicidal thoughts 0   PHQ-9 Score 1   Difficult doing work/chores Not difficult at all      Psychosocial Evaluation and Intervention:     Psychosocial Evaluation - 07/03/15 1542    Psychosocial Evaluation & Interventions   Interventions Encouraged to exercise with the program and follow exercise prescription   Continued Psychosocial Services Needed No       Psychosocial Re-Evaluation:     Psychosocial Re-Evaluation      07/27/15 1002           Psychosocial Re-Evaluation   Interventions Encouraged to attend Cardiac Rehabilitation for the exercise       Continued Psychosocial Services Needed No          Vocational Rehabilitation: Provide vocational rehab assistance to qualifying candidates.   Vocational Rehab Evaluation & Intervention:     Vocational Rehab - 07/03/15 1539    Initial Vocational Rehab Evaluation & Intervention   Assessment shows need for Vocational Rehabilitation No      Education: Education Goals: Education classes will be provided on a weekly basis, covering required topics. Participant will state understanding/return demonstration of topics presented.  Learning Barriers/Preferences:     Learning Barriers/Preferences - 07/03/15 1539    Learning Barriers/Preferences   Learning Barriers None   Learning Preferences Skilled Demonstration      Education Topics: Hypertension, Hypertension Reduction -Define heart disease and high blood pressure. Discus how high blood pressure affects the body and ways to reduce high blood pressure.   Exercise and Your Heart -Discuss why it is important to exercise, the FITT principles of exercise, normal and abnormal responses to exercise, and how to exercise safely.   Angina -Discuss definition of angina, causes of angina, treatment of angina, and how to decrease risk of having angina.   Cardiac Medications -Review what the following cardiac medications are used for, how they affect the body, and side effects that may occur when taking the medications.  Medications include Aspirin, Beta blockers, calcium channel blockers, ACE Inhibitors, angiotensin receptor blockers, diuretics, digoxin, and antihyperlipidemics.   Congestive Heart Failure -Discuss the definition of CHF, how to live with CHF, the signs and symptoms of CHF, and how keep track of weight and sodium  intake.   Heart Disease and Intimacy -Discus the effect sexual activity has on the heart, how changes occur during intimacy as we age, and safety during sexual activity.   Smoking  Cessation / COPD -Discuss different methods to quit smoking, the health benefits of quitting smoking, and the definition of COPD.   Nutrition I: Fats -Discuss the types of cholesterol, what cholesterol does to the heart, and how cholesterol levels can be controlled.   Nutrition II: Labels -Discuss the different components of food labels and how to read food label   Heart Parts and Heart Disease -Discuss the anatomy of the heart, the pathway of blood circulation through the heart, and these are affected by heart disease.   Stress I: Signs and Symptoms -Discuss the causes of stress, how stress may lead to anxiety and depression, and ways to limit stress.      CARDIAC REHAB PHASE II EXERCISE from 07/25/2015 in [place]   Date  07/11/15   Educator  D Jonus Coble   Instruction Review Code  2- meets goals/outcomes      Stress II: Relaxation -Discuss different types of relaxation techniques to limit stress.      CARDIAC REHAB PHASE II EXERCISE from 07/25/2015 in [place]   Date  07/18/15   Educator  D Syrianna Schillaci   Instruction Review Code  2- meets goals/outcomes      Warning Signs of Stroke / TIA -Discuss definition of a stroke, what the signs and symptoms are of a stroke, and how to identify when someone is having stroke.          CARDIAC REHAB PHASE II EXERCISE from 07/25/2015 in [place]   Date  07/25/15   Educator  Russella Dar   Instruction Review Code  2- meets goals/outcomes      Knowledge Questionnaire Score:     Knowledge Questionnaire Score - 07/03/15 1547    Knowledge Questionnaire Score   Pre Score 20/24      Core Components/Risk Factors/Patient Goals at Admission:     Personal Goals and Risk Factors at Admission -  07/03/15 1541    Core Components/Risk Factors/Patient Goals on Admission    Weight Management Weight Maintenance   Improve shortness of breath with ADL's Yes   Intervention Provide education, individualized exercise plan and daily activity instruction to help decrease symptoms of SOB with activities of daily living.   Expected Outcomes Short Term: Achieves a reduction of symptoms when performing activities of daily living.   Hypertension Yes   Intervention Monitor prescription use compliance.;Provide education on lifestyle modifcations including regular physical activity/exercise, weight management, moderate sodium restriction and increased consumption of fresh fruit, vegetables, and low fat dairy, alcohol moderation, and smoking cessation.   Expected Outcomes Long Term: Maintenance of blood pressure at goal levels.   Lipids Yes   Intervention Provide education and support for participant on nutrition & aerobic/resistive exercise along with prescribed medications to achieve LDL 70mg , HDL >40mg .   Expected Outcomes Long Term: Cholesterol controlled with medications as prescribed, with individualized exercise RX and with personalized nutrition plan. Value goals: LDL < 70mg , HDL > 40 mg.      Core Components/Risk Factors/Patient Goals Review:      Goals and Risk Factor Review      07/03/15 1542 07/27/15 1030         Core Components/Risk Factors/Patient Goals Review   Personal Goals Review Improve shortness of breath with ADL's;Hypertension;Lipids       Review  Patient states SOB is getting a little better. Will continue to support and educate to keep BP and lipids under control.       Expected  Outcomes  BP and lipids WNL, improved SOB         Core Components/Risk Factors/Patient Goals at Discharge (Final Review):      Goals and Risk Factor Review - 07/27/15 1030    Core Components/Risk Factors/Patient Goals Review   Review Patient states SOB is getting a little better. Will continue  to support and educate to keep BP and lipids under control.    Expected Outcomes BP and lipids WNL, improved SOB      ITP Comments:     ITP Comments      07/03/15 1539           ITP Comments Patient is a 75 year old man who lives alone.  Has strong family support and enjoys fishing, hunting and farming. Patient has no S/S of depression at this time.           Comments: Patient is expected to progress towards personal goals after completing sessions. Recommend continued exercise, lifestyle modifications, education and utilization of home exercise plan to increase stamina and strength.

## 2015-07-30 ENCOUNTER — Encounter (HOSPITAL_COMMUNITY)
Admission: RE | Admit: 2015-07-30 | Discharge: 2015-07-30 | Disposition: A | Discharge status: Home / Self Care | Payer: Medicare Other | Source: Ambulatory Visit | Attending: Cardiovascular Disease | Admitting: Cardiovascular Disease

## 2015-07-30 DIAGNOSIS — I10 Essential (primary) hypertension: Secondary | ICD-10-CM | POA: Diagnosis not present

## 2015-07-30 DIAGNOSIS — I252 Old myocardial infarction: Secondary | ICD-10-CM | POA: Insufficient documentation

## 2015-07-30 DIAGNOSIS — I739 Peripheral vascular disease, unspecified: Secondary | ICD-10-CM | POA: Insufficient documentation

## 2015-07-30 DIAGNOSIS — Z79899 Other long term (current) drug therapy: Secondary | ICD-10-CM | POA: Insufficient documentation

## 2015-07-30 DIAGNOSIS — K635 Polyp of colon: Secondary | ICD-10-CM | POA: Diagnosis not present

## 2015-07-30 DIAGNOSIS — I2510 Atherosclerotic heart disease of native coronary artery without angina pectoris: Secondary | ICD-10-CM | POA: Insufficient documentation

## 2015-07-30 DIAGNOSIS — Z951 Presence of aortocoronary bypass graft: Secondary | ICD-10-CM | POA: Diagnosis not present

## 2015-07-30 DIAGNOSIS — Z87891 Personal history of nicotine dependence: Secondary | ICD-10-CM | POA: Insufficient documentation

## 2015-07-30 DIAGNOSIS — Z7982 Long term (current) use of aspirin: Secondary | ICD-10-CM | POA: Diagnosis not present

## 2015-07-30 DIAGNOSIS — E785 Hyperlipidemia, unspecified: Secondary | ICD-10-CM | POA: Diagnosis not present

## 2015-07-30 DIAGNOSIS — J449 Chronic obstructive pulmonary disease, unspecified: Secondary | ICD-10-CM | POA: Insufficient documentation

## 2015-07-30 DIAGNOSIS — I214 Non-ST elevation (NSTEMI) myocardial infarction: Secondary | ICD-10-CM | POA: Insufficient documentation

## 2015-07-31 DIAGNOSIS — I2510 Atherosclerotic heart disease of native coronary artery without angina pectoris: Secondary | ICD-10-CM | POA: Diagnosis not present

## 2015-07-31 DIAGNOSIS — I10 Essential (primary) hypertension: Secondary | ICD-10-CM | POA: Diagnosis not present

## 2015-08-01 ENCOUNTER — Encounter (HOSPITAL_COMMUNITY)
Admission: RE | Admit: 2015-08-01 | Discharge: 2015-08-01 | Disposition: A | Discharge status: Home / Self Care | Payer: Medicare Other | Source: Ambulatory Visit | Attending: Cardiovascular Disease | Admitting: Cardiovascular Disease

## 2015-08-01 DIAGNOSIS — I214 Non-ST elevation (NSTEMI) myocardial infarction: Secondary | ICD-10-CM | POA: Diagnosis not present

## 2015-08-01 DIAGNOSIS — J449 Chronic obstructive pulmonary disease, unspecified: Secondary | ICD-10-CM | POA: Diagnosis not present

## 2015-08-01 DIAGNOSIS — Z79899 Other long term (current) drug therapy: Secondary | ICD-10-CM | POA: Diagnosis not present

## 2015-08-01 DIAGNOSIS — E785 Hyperlipidemia, unspecified: Secondary | ICD-10-CM | POA: Diagnosis not present

## 2015-08-01 DIAGNOSIS — Z951 Presence of aortocoronary bypass graft: Secondary | ICD-10-CM | POA: Diagnosis not present

## 2015-08-01 DIAGNOSIS — Z7982 Long term (current) use of aspirin: Secondary | ICD-10-CM | POA: Diagnosis not present

## 2015-08-03 ENCOUNTER — Encounter (HOSPITAL_COMMUNITY): Payer: Medicare Other

## 2015-08-06 ENCOUNTER — Encounter (HOSPITAL_COMMUNITY)
Admission: RE | Admit: 2015-08-06 | Discharge: 2015-08-06 | Disposition: A | Discharge status: Home / Self Care | Payer: Medicare Other | Source: Ambulatory Visit | Attending: Cardiovascular Disease | Admitting: Cardiovascular Disease

## 2015-08-06 DIAGNOSIS — E785 Hyperlipidemia, unspecified: Secondary | ICD-10-CM | POA: Diagnosis not present

## 2015-08-06 DIAGNOSIS — Z951 Presence of aortocoronary bypass graft: Secondary | ICD-10-CM | POA: Diagnosis not present

## 2015-08-06 DIAGNOSIS — I214 Non-ST elevation (NSTEMI) myocardial infarction: Secondary | ICD-10-CM | POA: Diagnosis not present

## 2015-08-06 DIAGNOSIS — Z7982 Long term (current) use of aspirin: Secondary | ICD-10-CM | POA: Diagnosis not present

## 2015-08-06 DIAGNOSIS — J449 Chronic obstructive pulmonary disease, unspecified: Secondary | ICD-10-CM | POA: Diagnosis not present

## 2015-08-06 DIAGNOSIS — Z79899 Other long term (current) drug therapy: Secondary | ICD-10-CM | POA: Diagnosis not present

## 2015-08-07 DIAGNOSIS — E559 Vitamin D deficiency, unspecified: Secondary | ICD-10-CM | POA: Diagnosis not present

## 2015-08-07 DIAGNOSIS — E119 Type 2 diabetes mellitus without complications: Secondary | ICD-10-CM | POA: Diagnosis not present

## 2015-08-07 DIAGNOSIS — I10 Essential (primary) hypertension: Secondary | ICD-10-CM | POA: Diagnosis not present

## 2015-08-07 DIAGNOSIS — I2510 Atherosclerotic heart disease of native coronary artery without angina pectoris: Secondary | ICD-10-CM | POA: Diagnosis not present

## 2015-08-07 DIAGNOSIS — E785 Hyperlipidemia, unspecified: Secondary | ICD-10-CM | POA: Diagnosis not present

## 2015-08-08 ENCOUNTER — Encounter (HOSPITAL_COMMUNITY)
Admission: RE | Admit: 2015-08-08 | Discharge: 2015-08-08 | Disposition: A | Discharge status: Home / Self Care | Payer: Medicare Other | Source: Ambulatory Visit | Attending: Cardiovascular Disease | Admitting: Cardiovascular Disease

## 2015-08-08 DIAGNOSIS — J449 Chronic obstructive pulmonary disease, unspecified: Secondary | ICD-10-CM | POA: Diagnosis not present

## 2015-08-08 DIAGNOSIS — Z79899 Other long term (current) drug therapy: Secondary | ICD-10-CM | POA: Diagnosis not present

## 2015-08-08 DIAGNOSIS — I214 Non-ST elevation (NSTEMI) myocardial infarction: Secondary | ICD-10-CM | POA: Diagnosis not present

## 2015-08-08 DIAGNOSIS — E785 Hyperlipidemia, unspecified: Secondary | ICD-10-CM | POA: Diagnosis not present

## 2015-08-08 DIAGNOSIS — Z951 Presence of aortocoronary bypass graft: Secondary | ICD-10-CM | POA: Diagnosis not present

## 2015-08-08 DIAGNOSIS — Z7982 Long term (current) use of aspirin: Secondary | ICD-10-CM | POA: Diagnosis not present

## 2015-08-10 ENCOUNTER — Encounter (HOSPITAL_COMMUNITY)
Admission: RE | Admit: 2015-08-10 | Discharge: 2015-08-10 | Disposition: A | Discharge status: Home / Self Care | Payer: Medicare Other | Source: Ambulatory Visit | Attending: Cardiovascular Disease | Admitting: Cardiovascular Disease

## 2015-08-10 DIAGNOSIS — Z79899 Other long term (current) drug therapy: Secondary | ICD-10-CM | POA: Diagnosis not present

## 2015-08-10 DIAGNOSIS — Z7982 Long term (current) use of aspirin: Secondary | ICD-10-CM | POA: Diagnosis not present

## 2015-08-10 DIAGNOSIS — J449 Chronic obstructive pulmonary disease, unspecified: Secondary | ICD-10-CM | POA: Diagnosis not present

## 2015-08-10 DIAGNOSIS — Z951 Presence of aortocoronary bypass graft: Secondary | ICD-10-CM | POA: Diagnosis not present

## 2015-08-10 DIAGNOSIS — I214 Non-ST elevation (NSTEMI) myocardial infarction: Secondary | ICD-10-CM | POA: Diagnosis not present

## 2015-08-10 DIAGNOSIS — E785 Hyperlipidemia, unspecified: Secondary | ICD-10-CM | POA: Diagnosis not present

## 2015-08-13 ENCOUNTER — Encounter (HOSPITAL_COMMUNITY)
Admission: RE | Admit: 2015-08-13 | Discharge: 2015-08-13 | Disposition: A | Discharge status: Home / Self Care | Payer: Medicare Other | Source: Ambulatory Visit | Attending: Cardiovascular Disease | Admitting: Cardiovascular Disease

## 2015-08-13 DIAGNOSIS — E785 Hyperlipidemia, unspecified: Secondary | ICD-10-CM | POA: Diagnosis not present

## 2015-08-13 DIAGNOSIS — Z951 Presence of aortocoronary bypass graft: Secondary | ICD-10-CM | POA: Diagnosis not present

## 2015-08-13 DIAGNOSIS — Z79899 Other long term (current) drug therapy: Secondary | ICD-10-CM | POA: Diagnosis not present

## 2015-08-13 DIAGNOSIS — Z7982 Long term (current) use of aspirin: Secondary | ICD-10-CM | POA: Diagnosis not present

## 2015-08-13 DIAGNOSIS — I214 Non-ST elevation (NSTEMI) myocardial infarction: Secondary | ICD-10-CM | POA: Diagnosis not present

## 2015-08-13 DIAGNOSIS — J449 Chronic obstructive pulmonary disease, unspecified: Secondary | ICD-10-CM | POA: Diagnosis not present

## 2015-08-15 ENCOUNTER — Encounter (HOSPITAL_COMMUNITY)
Admission: RE | Admit: 2015-08-15 | Discharge: 2015-08-15 | Disposition: A | Discharge status: Home / Self Care | Payer: Medicare Other | Source: Ambulatory Visit | Attending: Cardiovascular Disease | Admitting: Cardiovascular Disease

## 2015-08-15 DIAGNOSIS — J449 Chronic obstructive pulmonary disease, unspecified: Secondary | ICD-10-CM | POA: Diagnosis not present

## 2015-08-15 DIAGNOSIS — Z951 Presence of aortocoronary bypass graft: Secondary | ICD-10-CM | POA: Diagnosis not present

## 2015-08-15 DIAGNOSIS — Z7982 Long term (current) use of aspirin: Secondary | ICD-10-CM | POA: Diagnosis not present

## 2015-08-15 DIAGNOSIS — I214 Non-ST elevation (NSTEMI) myocardial infarction: Secondary | ICD-10-CM | POA: Diagnosis not present

## 2015-08-15 DIAGNOSIS — Z79899 Other long term (current) drug therapy: Secondary | ICD-10-CM | POA: Diagnosis not present

## 2015-08-15 DIAGNOSIS — E785 Hyperlipidemia, unspecified: Secondary | ICD-10-CM | POA: Diagnosis not present

## 2015-08-17 ENCOUNTER — Encounter (HOSPITAL_COMMUNITY)
Admission: RE | Admit: 2015-08-17 | Discharge: 2015-08-17 | Disposition: A | Discharge status: Home / Self Care | Payer: Medicare Other | Source: Ambulatory Visit | Attending: Cardiovascular Disease | Admitting: Cardiovascular Disease

## 2015-08-17 DIAGNOSIS — Z951 Presence of aortocoronary bypass graft: Secondary | ICD-10-CM | POA: Diagnosis not present

## 2015-08-17 DIAGNOSIS — I214 Non-ST elevation (NSTEMI) myocardial infarction: Secondary | ICD-10-CM | POA: Diagnosis not present

## 2015-08-17 DIAGNOSIS — E785 Hyperlipidemia, unspecified: Secondary | ICD-10-CM | POA: Diagnosis not present

## 2015-08-17 DIAGNOSIS — Z7982 Long term (current) use of aspirin: Secondary | ICD-10-CM | POA: Diagnosis not present

## 2015-08-17 DIAGNOSIS — Z79899 Other long term (current) drug therapy: Secondary | ICD-10-CM | POA: Diagnosis not present

## 2015-08-17 DIAGNOSIS — J449 Chronic obstructive pulmonary disease, unspecified: Secondary | ICD-10-CM | POA: Diagnosis not present

## 2015-08-20 ENCOUNTER — Encounter (HOSPITAL_COMMUNITY)
Admission: RE | Admit: 2015-08-20 | Discharge: 2015-08-20 | Disposition: A | Discharge status: Home / Self Care | Payer: Medicare Other | Source: Ambulatory Visit | Attending: Cardiovascular Disease | Admitting: Cardiovascular Disease

## 2015-08-20 DIAGNOSIS — Z79899 Other long term (current) drug therapy: Secondary | ICD-10-CM | POA: Diagnosis not present

## 2015-08-20 DIAGNOSIS — I214 Non-ST elevation (NSTEMI) myocardial infarction: Secondary | ICD-10-CM | POA: Diagnosis not present

## 2015-08-20 DIAGNOSIS — Z951 Presence of aortocoronary bypass graft: Secondary | ICD-10-CM | POA: Diagnosis not present

## 2015-08-20 DIAGNOSIS — Z7982 Long term (current) use of aspirin: Secondary | ICD-10-CM | POA: Diagnosis not present

## 2015-08-20 DIAGNOSIS — E785 Hyperlipidemia, unspecified: Secondary | ICD-10-CM | POA: Diagnosis not present

## 2015-08-20 DIAGNOSIS — J449 Chronic obstructive pulmonary disease, unspecified: Secondary | ICD-10-CM | POA: Diagnosis not present

## 2015-08-22 ENCOUNTER — Encounter (HOSPITAL_COMMUNITY)
Admission: RE | Admit: 2015-08-22 | Discharge: 2015-08-22 | Disposition: A | Discharge status: Home / Self Care | Payer: Medicare Other | Source: Ambulatory Visit | Attending: Cardiovascular Disease | Admitting: Cardiovascular Disease

## 2015-08-22 NOTE — Progress Notes (Signed)
Daily Session Note  Patient Details  Name: William [name] MRN: [id number] Date of Birth: 1940-04-13 Referring Provider:    Encounter Date: 08/22/2015  Check In:     Session Check In - 08/22/15 0930    Check-In   Location [facility]   Staff Present Sela Falk Angelina Pih, MS, EP, [facility], Exercise Physiologist;Christy Oletta Lamas, RN, BSN  Nils Flack, EP   Supervising physician immediately available to respond to emergencies See telemetry face sheet for immediately available MD   Medication changes reported     No   Fall or balance concerns reported    No   Warm-up and Cool-down Performed as group-led instruction   Resistance Training Performed Yes   VAD Patient? No   Pain Assessment   Currently in Pain? No/denies   Multiple Pain Sites No      Capillary Blood Glucose: No results found for this or any previous visit (from the past 24 hour(s)).   Goals Met:  Independence with exercise equipment Improved SOB with ADL's Exercise tolerated well No report of cardiac concerns or symptoms Strength training completed today  Goals Unmet:  RPE BP HR  Comments: Patient is progressing appropriately. Check out: 10:30am   Dr. Kate Sable is Medical Director for [place] and Pulmonary Rehab.

## 2015-08-24 ENCOUNTER — Encounter (HOSPITAL_COMMUNITY)
Admission: RE | Admit: 2015-08-24 | Discharge: 2015-08-24 | Disposition: A | Discharge status: Home / Self Care | Payer: Medicare Other | Source: Ambulatory Visit | Attending: Cardiovascular Disease | Admitting: Cardiovascular Disease

## 2015-08-24 DIAGNOSIS — I214 Non-ST elevation (NSTEMI) myocardial infarction: Secondary | ICD-10-CM | POA: Diagnosis not present

## 2015-08-24 DIAGNOSIS — Z951 Presence of aortocoronary bypass graft: Secondary | ICD-10-CM

## 2015-08-24 DIAGNOSIS — J449 Chronic obstructive pulmonary disease, unspecified: Secondary | ICD-10-CM | POA: Diagnosis not present

## 2015-08-24 DIAGNOSIS — Z79899 Other long term (current) drug therapy: Secondary | ICD-10-CM | POA: Diagnosis not present

## 2015-08-24 DIAGNOSIS — Z7982 Long term (current) use of aspirin: Secondary | ICD-10-CM | POA: Diagnosis not present

## 2015-08-24 DIAGNOSIS — E785 Hyperlipidemia, unspecified: Secondary | ICD-10-CM | POA: Diagnosis not present

## 2015-08-24 NOTE — Progress Notes (Signed)
Daily Session Note  Patient Details  Name: William [name] MRN: [id number] Date of Birth: [birth year] Referring Provider:    Encounter Date: 08/24/2015  Check In:     Session Check In - 08/24/15 0930    Check-In   Location [facility]   Staff Present Rocket Gunderson Angelina Pih, MS, EP, [facility], Exercise Physiologist;Christy Oletta Lamas, RN, BSN  Nils Flack, EP   Supervising physician immediately available to respond to emergencies See telemetry face sheet for immediately available MD   Medication changes reported     No   Fall or balance concerns reported    No   Warm-up and Cool-down Performed as group-led instruction   Resistance Training Performed Yes   VAD Patient? No   Pain Assessment   Currently in Pain? No/denies   Multiple Pain Sites No      Capillary Blood Glucose: No results found for this or any previous visit (from the past 24 hour(s)).   Goals Met:  Independence with exercise equipment Exercise tolerated well No report of cardiac concerns or symptoms Strength training completed today  Goals Unmet:  RPE BP HR  Comments: Patient is progressing appropriately. Check out: 1030   Dr. Kate Sable is Medical Director for [facility] Cardiac and Pulmonary Rehab.

## 2015-08-26 NOTE — Progress Notes (Signed)
Cardiac Individual Treatment Plan  Patient Details  Name: William [name] MRN: [id number] Date of Birth: 07/17/1940 Referring Provider:    Initial Encounter Date:       CARDIAC REHAB PHASE II ORIENTATION from 07/03/2015 in [place]   Date  07/03/15      Visit Diagnosis: NSTEMI (non-ST elevation myocardial infarction) ([place])  S/P CABG x 2  Patient's Home Medications on Admission:  Current outpatient prescriptions:  .  Ascorbic Acid (VITAMIN C) 1000 MG tablet, Take 1,000 mg by mouth daily., Disp: , Rfl:  .  aspirin EC 325 MG EC tablet, Take 1 tablet (325 mg total) by mouth daily., Disp: , Rfl:  .  docusate sodium (COLACE) 100 MG capsule, Take 100 mg by mouth daily as needed for mild constipation., Disp: , Rfl:  .  fish oil-omega-3 fatty acids 1000 MG capsule, Take 1,000 g by mouth 2 (two) times daily., Disp: , Rfl:  .  gabapentin (NEURONTIN) 300 MG capsule, Take 600 mg by mouth every evening. , Disp: , Rfl:  .  Garlic [id number] MG TABS, Take 1,250 mg by mouth daily. , Disp: , Rfl:  .  lisinopril (PRINIVIL,ZESTRIL) 5 MG tablet, Take 1 tablet (5 mg total) by mouth daily., Disp: 30 tablet, Rfl: 6 .  metoprolol tartrate (LOPRESSOR) 25 MG tablet, Take 1 tablet (25 mg total) by mouth 2 (two) times daily., Disp: 60 tablet, Rfl: 3 .  Multiple Vitamin (MULTIVITAMIN) tablet, Take 1 tablet by mouth daily., Disp: , Rfl:  .  rosuvastatin (CRESTOR) 10 MG tablet, Take 1 tablet (10 mg total) by mouth daily., Disp: 90 tablet, Rfl: 3 .  SPIRIVA HANDIHALER 18 MCG inhalation capsule, Place 18 mcg into inhaler and inhale daily. , Disp: , Rfl: 8 .  thiamine (VITAMIN B-1) 100 MG tablet, Take 100 mg by mouth daily., Disp: , Rfl:  .  vitamin E 400 UNIT capsule, Take 400 Units by mouth daily., Disp: , Rfl:   Past Medical History: Past Medical History  Diagnosis Date  . Hypertension   . Hyperlipidemia   . Peripheral arterial disease ([place])   . Colon polyp   . Carotid artery occlusion   .  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 COPD (chronic obstructive pulmonary disease) ([place])   . Myocardial infarction ([place])   . Coronary artery disease     Tobacco Use: History  Smoking status  . Former Smoker -- 2.00 packs/day  . Types: Cigarettes  . Quit date: 11/17/1991  Smokeless tobacco  . Never Used    Labs:     Recent Review Flowsheet Data    Labs for ITP Cardiac and Pulmonary Rehab Latest Ref Rng 05/22/2015 05/22/2015 05/22/2015 05/22/2015 05/23/2015   PHART 7.350 - 7.450 7.341(L) 7.308(L) - 7.316(L) -   PCO2ART 35.0 - 45.0 mmHg 37.3 38.6 - 35.2 -   HCO3 20.0 - 24.0 mEq/L 20.3 19.3(L) - 17.9(L) -   TCO2 0 - 100 mmol/L 22 20 19 19 23    ACIDBASEDEF 0.0 - 2.0 mmol/L 5.0(H) 6.0(H) - 7.0(H) -   O2SAT - 87.0 89.0 - 90.0 -      Capillary Blood Glucose: Lab Results  Component Value Date   GLUCAP 81 05/29/2015   GLUCAP 90 05/29/2015   GLUCAP 100* 05/28/2015   GLUCAP 92 05/28/2015   GLUCAP 94 05/28/2015     Exercise Target Goals:    Exercise Program Goal: Individual exercise prescription set with THRR, safety & activity barriers. Participant demonstrates ability to understand  and report RPE using BORG scale, to self-measure pulse accurately, and to acknowledge the importance of the exercise prescription.  Exercise Prescription Goal: Starting with aerobic activity 30 plus minutes a day, 3 days per week for initial exercise prescription. Provide home exercise prescription and guidelines that participant acknowledges understanding prior to discharge.  Activity Barriers & Risk Stratification:     Activity Barriers & Cardiac Risk Stratification - 07/03/15 1539    Activity Barriers & Cardiac Risk Stratification   Activity Barriers Deconditioning   Cardiac Risk Stratification High      6 Minute Walk:     6 Minute Walk      07/03/15 1510       6 Minute Walk   Phase Initial     Distance 800 feet     Walk Time 6 minutes     # of Rest Breaks 0     MPH 1.52     METS 2.17      RPE 11     Perceived Dyspnea  13     VO2 Peak 7.29     Symptoms Yes (comment)     Comments leg cramping rated moderate ( 4 on claudication scale)     Resting HR 87 bpm     Resting BP 120/70 mmHg     Max Ex. HR 102 bpm     Max Ex. BP 142/70 mmHg     2 Minute Post BP 118/60 mmHg        Initial Exercise Prescription:     Initial Exercise Prescription - 07/03/15 1600    Date of Initial Exercise RX and Referring Provider   Date 07/03/15   Treadmill   MPH 1   Grade 0   Minutes 15   METs 1.77   NuStep   Level 2   Minutes 15   METs 1.5   Arm Ergometer   Level 1.5   Minutes 15   METs 1.5   Prescription Details   Frequency (times per week) 3   Intensity   THRR REST +  30   THRR 40-80% of Max Heartrate 111-134   Ratings of Perceived Exertion 11-13   Progression   Progression Continue to progress workloads to maintain intensity without signs/symptoms of physical distress.   Resistance Training   Training Prescription Yes   Weight 1.0   Reps 10-12      Perform Capillary Blood Glucose checks as needed.  Exercise Prescription Changes:      Exercise Prescription Changes      07/27/15 1900 08/26/15 1200         Exercise Review   Progression Yes Yes      Response to Exercise   Blood Pressure (Admit) 118/80 mmHg 138/62 mmHg      Blood Pressure (Exercise) 162/62 mmHg 142/80 mmHg      Blood Pressure (Exit) 160/50 mmHg 130/70 mmHg      Heart Rate (Admit) 82 bpm 69 bpm      Heart Rate (Exercise) 104 bpm 128 bpm      Heart Rate (Exit) 83 bpm 78 bpm      Rating of Perceived Exertion (Exercise)  11      Symptoms No No      Comments  Patient unable to do the Treadmill. Switched to [employer] and Arm Crank      Duration Progress to 30 minutes of continuous aerobic without signs/symptoms of physical distress Progress to 30 minutes of continuous aerobic without signs/symptoms of physical distress  Intensity  Rest + 30      Progression   Progression Continue to progress  workloads to maintain intensity without signs/symptoms of physical distress. Continue to progress workloads to maintain intensity without signs/symptoms of physical distress.      Resistance Training   Training Prescription Yes Yes      Weight 1.0 2      Reps 10-12 10-12      Interval Training   Interval Training  No      Treadmill   MPH  0      Grade  0      Minutes  0      METs  0      NuStep   Level 2 2      Minutes 15 15      METs 2.6 2.6      Arm Ergometer   Level 1.5 2      Minutes 15 15      METs 1.5 3.6      Home Exercise Plan   Plans to continue exercise at [place] 2 additional days to program exercise sessions. Add 2 additional days to program exercise sessions.         Exercise Comments:      Exercise Comments      07/27/15 1955 08/26/15 1227         Exercise Comments Patient is progressing appropriately. Had to switch patient from the TM to the Nustep and Arm Crank due to not having enough strength/stamina. Other than that patient is progressing appropriately.           Discharge Exercise Prescription (Final Exercise Prescription Changes):     Exercise Prescription Changes - 08/26/15 1200    Exercise Review   Progression Yes   Response to Exercise   Blood Pressure (Admit) 138/62 mmHg   Blood Pressure (Exercise) 142/80 mmHg   Blood Pressure (Exit) 130/70 mmHg   Heart Rate (Admit) 69 bpm   Heart Rate (Exercise) 128 bpm   Heart Rate (Exit) 78 bpm   Rating of Perceived Exertion (Exercise) 11   Symptoms No   Comments Patient unable to do the Treadmill. Switched to [employer] and Arm Crank   Duration Progress to 30 minutes of continuous aerobic without signs/symptoms of physical distress   Intensity Rest + 30   Progression   Progression Continue to progress workloads to maintain intensity without signs/symptoms of physical distress.   Resistance Training   Training Prescription Yes   Weight 2   Reps 10-12   Interval Training    Interval Training No   Treadmill   MPH 0   Grade 0   Minutes 0   METs 0   NuStep   Level 2   Minutes 15   METs 2.6   Arm Ergometer   Level 2   Minutes 15   METs 3.6   Home Exercise Plan   Plans to continue exercise at Home   Frequency Add 2 additional days to program exercise sessions.      Nutrition:  Target Goals: Understanding of nutrition guidelines, daily intake of sodium 1500mg , cholesterol 200mg , calories 30% from fat and 7% or less from saturated fats, daily to have 5 or more servings of fruits and vegetables.  Biometrics:     Pre Biometrics - 07/03/15 1618    Pre Biometrics   Height 5\' 6"  (1.676 m)   Weight 149 lb 14.4 oz (67.994 kg)  Waist Circumference 37 inches   Hip Circumference 35 inches   Waist to Hip Ratio 1.06 %   BMI (Calculated) 24.2   Triceps Skinfold 6 mm   % Body Fat 21.6 %   Grip Strength 59 kg   Flexibility 0 in   Single Leg Stand 30 seconds       Nutrition Therapy Plan and Nutrition Goals:     Nutrition Therapy & Goals - 07/03/15 1540    Intervention Plan   Intervention Nutrition handout(s) given to patient.   Expected Outcomes Short Term Goal: Understand basic principles of dietary content, such as calories, fat, sodium, cholesterol and nutrients.      Nutrition Discharge: Rate Your Plate Scores:     Nutrition Assessments - 07/03/15 1540    MEDFICTS Scores   Pre Score 38      Nutrition Goals Re-Evaluation:   Psychosocial: Target Goals: Acknowledge presence or absence of depression, maximize coping skills, provide positive support system. Participant is able to verbalize types and ability to use techniques and skills needed for reducing stress and depression.  Initial Review & Psychosocial Screening:     Initial Psych Review & Screening - 07/03/15 1542    Initial Review   Current issues with --  none   Family Dynamics   Good Support System? Yes   Barriers   Psychosocial barriers to participate in program There  are no identifiable barriers or psychosocial needs.   Screening Interventions   Interventions Encouraged to exercise      Quality of Life Scores:     Quality of Life - 07/03/15 1617    Quality of Life Scores   Health/Function Pre 25.29 %   Socioeconomic Pre 23.75 %   Psych/Spiritual Pre 25 %   Family Pre 30 %   GLOBAL Pre 25.55 %      PHQ-9:     Recent Review Flowsheet Data    Depression screen [facility] 2/9 07/03/2015   Decreased Interest 0   Down, Depressed, Hopeless 1   PHQ - 2 Score 1   Altered sleeping 0   Tired, decreased energy 0   Change in appetite 0   Feeling bad or failure about yourself  0   Trouble concentrating 0   Moving slowly or fidgety/restless 0   Suicidal thoughts 0   PHQ-9 Score 1   Difficult doing work/chores Not difficult at all      Psychosocial Evaluation and Intervention:     Psychosocial Evaluation - 07/03/15 1542    Psychosocial Evaluation & Interventions   Interventions Encouraged to exercise with the program and follow exercise prescription   Continued Psychosocial Services Needed No      Psychosocial Re-Evaluation:     Psychosocial Re-Evaluation      07/27/15 1002 08/20/15 1506         Psychosocial Re-Evaluation   Interventions Encouraged to attend Cardiac Rehabilitation for the exercise Encouraged to attend Cardiac Rehabilitation for the exercise      Continued Psychosocial Services Needed No No         Vocational Rehabilitation: Provide vocational rehab assistance to qualifying candidates.   Vocational Rehab Evaluation & Intervention:     Vocational Rehab - 07/03/15 1539    Initial Vocational Rehab Evaluation & Intervention   Assessment shows need for Vocational Rehabilitation No      Education: Education Goals: Education classes will be provided on a weekly basis, covering required topics. Participant will state understanding/return demonstration of topics presented.  Learning Barriers/Preferences:  Learning  Barriers/Preferences - 07/03/15 1539    Learning Barriers/Preferences   Learning Barriers None   Learning Preferences Skilled Demonstration      Education Topics: Hypertension, Hypertension Reduction -Define heart disease and high blood pressure. Discus how high blood pressure affects the body and ways to reduce high blood pressure.      CARDIAC REHAB PHASE II EXERCISE from 08/22/2015 in [place]   Date  08/01/15   Educator  Russella Dar   Instruction Review Code  2- meets goals/outcomes      Exercise and Your Heart -Discuss why it is important to exercise, the FITT principles of exercise, normal and abnormal responses to exercise, and how to exercise safely.      CARDIAC REHAB PHASE II EXERCISE from 08/22/2015 in [place]   Date  08/08/15   Educator  DC   Instruction Review Code  2- meets goals/outcomes      Angina -Discuss definition of angina, causes of angina, treatment of angina, and how to decrease risk of having angina.      CARDIAC REHAB PHASE II EXERCISE from 08/22/2015 in [place]   Date  08/15/15   Educator  D Naesha Buckalew   Instruction Review Code  2- meets goals/outcomes      Cardiac Medications -Review what the following cardiac medications are used for, how they affect the body, and side effects that may occur when taking the medications.  Medications include Aspirin, Beta blockers, calcium channel blockers, ACE Inhibitors, angiotensin receptor blockers, diuretics, digoxin, and antihyperlipidemics.      CARDIAC REHAB PHASE II EXERCISE from 08/22/2015 in [place]   Date  08/22/15   Educator  D. Anthea Udovich   Instruction Review Code  2- meets goals/outcomes      Congestive Heart Failure -Discuss the definition of CHF, how to live with CHF, the signs and symptoms of CHF, and how keep track of weight and sodium intake.   Heart Disease and Intimacy -Discus the effect sexual  activity has on the heart, how changes occur during intimacy as we age, and safety during sexual activity.   Smoking Cessation / COPD -Discuss different methods to quit smoking, the health benefits of quitting smoking, and the definition of COPD.   Nutrition I: Fats -Discuss the types of cholesterol, what cholesterol does to the heart, and how cholesterol levels can be controlled.   Nutrition II: Labels -Discuss the different components of food labels and how to read food label   Heart Parts and Heart Disease -Discuss the anatomy of the heart, the pathway of blood circulation through the heart, and these are affected by heart disease.   Stress I: Signs and Symptoms -Discuss the causes of stress, how stress may lead to anxiety and depression, and ways to limit stress.      CARDIAC REHAB PHASE II EXERCISE from 08/22/2015 in [place]   Date  07/11/15   Educator  D Emmelyn Schmale   Instruction Review Code  2- meets goals/outcomes      Stress II: Relaxation -Discuss different types of relaxation techniques to limit stress.      CARDIAC REHAB PHASE II EXERCISE from 08/22/2015 in [place]   Date  07/18/15   Educator  D Melisha Eggleton   Instruction Review Code  2- meets goals/outcomes      Warning Signs of Stroke / TIA -Discuss definition of a stroke, what the signs and symptoms are of a  stroke, and how to identify when someone is having stroke.          CARDIAC REHAB PHASE II EXERCISE from 08/22/2015 in [place]   Date  07/25/15   Educator  Russella Dar   Instruction Review Code  2- meets goals/outcomes      Knowledge Questionnaire Score:     Knowledge Questionnaire Score - 07/03/15 1547    Knowledge Questionnaire Score   Pre Score 20/24      Core Components/Risk Factors/Patient Goals at Admission:     Personal Goals and Risk Factors at Admission - 07/03/15 1541    Core Components/Risk Factors/Patient Goals on  Admission    Weight Management Weight Maintenance   Improve shortness of breath with ADL's Yes   Intervention Provide education, individualized exercise plan and daily activity instruction to help decrease symptoms of SOB with activities of daily living.   Expected Outcomes Short Term: Achieves a reduction of symptoms when performing activities of daily living.   Hypertension Yes   Intervention Monitor prescription use compliance.;Provide education on lifestyle modifcations including regular physical activity/exercise, weight management, moderate sodium restriction and increased consumption of fresh fruit, vegetables, and low fat dairy, alcohol moderation, and smoking cessation.   Expected Outcomes Long Term: Maintenance of blood pressure at goal levels.   Lipids Yes   Intervention Provide education and support for participant on nutrition & aerobic/resistive exercise along with prescribed medications to achieve LDL 70mg , HDL >40mg .   Expected Outcomes Long Term: Cholesterol controlled with medications as prescribed, with individualized exercise RX and with personalized nutrition plan. Value goals: LDL < 70mg , HDL > 40 mg.      Core Components/Risk Factors/Patient Goals Review:      Goals and Risk Factor Review      07/03/15 1542 07/27/15 1030 08/20/15 1506       Core Components/Risk Factors/Patient Goals Review   Personal Goals Review Improve shortness of breath with ADL's;Hypertension;Lipids  Improve shortness of breath with ADL's;Hypertension;Lipids     Review  Patient states SOB is getting a little better. Will continue to support and educate to keep BP and lipids under control.  Patient states SOB is the same the last thirty days but better since started.  Will continue to support and educate to keep BP and lipids under control.      Expected Outcomes  BP and lipids WNL, improved SOB BP and lipids WNL, improved SOB        Core Components/Risk Factors/Patient Goals at Discharge  (Final Review):      Goals and Risk Factor Review - 08/20/15 1506    Core Components/Risk Factors/Patient Goals Review   Personal Goals Review Improve shortness of breath with ADL's;Hypertension;Lipids   Review Patient states SOB is the same the last thirty days but better since started.  Will continue to support and educate to keep BP and lipids under control.    Expected Outcomes BP and lipids WNL, improved SOB      ITP Comments:     ITP Comments      07/03/15 1539           ITP Comments Patient is a 75 year old man who lives alone.  Has strong family support and enjoys fishing, hunting and farming. Patient has no S/S of depression at this time.           Comments: Patient is doing well with program. We will continue to monitor for progress.

## 2015-08-27 ENCOUNTER — Encounter (HOSPITAL_COMMUNITY): Payer: Medicare Other

## 2015-08-29 ENCOUNTER — Encounter (HOSPITAL_COMMUNITY)
Admission: RE | Admit: 2015-08-29 | Discharge: 2015-08-29 | Disposition: A | Discharge status: Home / Self Care | Payer: Medicare Other | Source: Ambulatory Visit | Attending: Cardiovascular Disease | Admitting: Cardiovascular Disease

## 2015-08-29 DIAGNOSIS — Z951 Presence of aortocoronary bypass graft: Secondary | ICD-10-CM | POA: Diagnosis not present

## 2015-08-29 DIAGNOSIS — E785 Hyperlipidemia, unspecified: Secondary | ICD-10-CM | POA: Diagnosis not present

## 2015-08-29 DIAGNOSIS — Z7982 Long term (current) use of aspirin: Secondary | ICD-10-CM | POA: Diagnosis not present

## 2015-08-29 DIAGNOSIS — J449 Chronic obstructive pulmonary disease, unspecified: Secondary | ICD-10-CM | POA: Diagnosis not present

## 2015-08-29 DIAGNOSIS — I2510 Atherosclerotic heart disease of native coronary artery without angina pectoris: Secondary | ICD-10-CM | POA: Diagnosis not present

## 2015-08-29 DIAGNOSIS — I214 Non-ST elevation (NSTEMI) myocardial infarction: Secondary | ICD-10-CM | POA: Diagnosis not present

## 2015-08-29 DIAGNOSIS — Z79899 Other long term (current) drug therapy: Secondary | ICD-10-CM | POA: Diagnosis not present

## 2015-08-29 DIAGNOSIS — E119 Type 2 diabetes mellitus without complications: Secondary | ICD-10-CM | POA: Diagnosis not present

## 2015-08-31 ENCOUNTER — Encounter (HOSPITAL_COMMUNITY)
Admission: RE | Admit: 2015-08-31 | Discharge: 2015-08-31 | Disposition: A | Discharge status: Home / Self Care | Payer: Medicare Other | Source: Ambulatory Visit | Attending: Cardiovascular Disease | Admitting: Cardiovascular Disease

## 2015-08-31 DIAGNOSIS — I739 Peripheral vascular disease, unspecified: Secondary | ICD-10-CM | POA: Insufficient documentation

## 2015-08-31 DIAGNOSIS — Z951 Presence of aortocoronary bypass graft: Secondary | ICD-10-CM | POA: Insufficient documentation

## 2015-08-31 DIAGNOSIS — K635 Polyp of colon: Secondary | ICD-10-CM | POA: Insufficient documentation

## 2015-08-31 DIAGNOSIS — Z7982 Long term (current) use of aspirin: Secondary | ICD-10-CM | POA: Insufficient documentation

## 2015-08-31 DIAGNOSIS — I214 Non-ST elevation (NSTEMI) myocardial infarction: Secondary | ICD-10-CM | POA: Insufficient documentation

## 2015-08-31 DIAGNOSIS — J449 Chronic obstructive pulmonary disease, unspecified: Secondary | ICD-10-CM | POA: Insufficient documentation

## 2015-08-31 DIAGNOSIS — I2510 Atherosclerotic heart disease of native coronary artery without angina pectoris: Secondary | ICD-10-CM | POA: Diagnosis not present

## 2015-08-31 DIAGNOSIS — Z87891 Personal history of nicotine dependence: Secondary | ICD-10-CM | POA: Diagnosis not present

## 2015-08-31 DIAGNOSIS — I10 Essential (primary) hypertension: Secondary | ICD-10-CM | POA: Diagnosis not present

## 2015-08-31 DIAGNOSIS — E785 Hyperlipidemia, unspecified: Secondary | ICD-10-CM | POA: Diagnosis not present

## 2015-08-31 DIAGNOSIS — I252 Old myocardial infarction: Secondary | ICD-10-CM | POA: Diagnosis not present

## 2015-08-31 DIAGNOSIS — Z79899 Other long term (current) drug therapy: Secondary | ICD-10-CM | POA: Insufficient documentation

## 2015-08-31 NOTE — Progress Notes (Signed)
Daily Session Note  Patient Details  Name: William [name] MRN: [id number] Date of Birth: [birth year] Referring Provider:    Encounter Date: 08/31/2015  Check In:     Session Check In - 08/31/15 0930    Check-In   Location [facility]   Staff Present Russella Dar, MS, EP, [facility], Exercise Physiologist;Christy Oletta Lamas, RN, BSN   Supervising physician immediately available to respond to emergencies See telemetry face sheet for immediately available MD   Medication changes reported     No   Fall or balance concerns reported    No   Warm-up and Cool-down Performed as group-led instruction   Resistance Training Performed Yes   VAD Patient? No   Pain Assessment   Currently in Pain? No/denies      Capillary Blood Glucose: No results found for this or any previous visit (from the past 24 hour(s)).   Goals Met:  Independence with exercise equipment Exercise tolerated well No report of cardiac concerns or symptoms Strength training completed today  Goals Unmet:  Not Applicable  Comments: Check out 10:30   Dr. Kate Sable is Medical Director for [place] and Pulmonary Rehab.

## 2015-09-03 ENCOUNTER — Encounter (HOSPITAL_COMMUNITY)
Admission: RE | Admit: 2015-09-03 | Discharge: 2015-09-03 | Disposition: A | Discharge status: Home / Self Care | Payer: Medicare Other | Source: Ambulatory Visit | Attending: Cardiovascular Disease | Admitting: Cardiovascular Disease

## 2015-09-03 DIAGNOSIS — E785 Hyperlipidemia, unspecified: Secondary | ICD-10-CM | POA: Diagnosis not present

## 2015-09-03 DIAGNOSIS — Z7982 Long term (current) use of aspirin: Secondary | ICD-10-CM | POA: Diagnosis not present

## 2015-09-03 DIAGNOSIS — E119 Type 2 diabetes mellitus without complications: Secondary | ICD-10-CM | POA: Diagnosis not present

## 2015-09-03 DIAGNOSIS — I10 Essential (primary) hypertension: Secondary | ICD-10-CM | POA: Diagnosis not present

## 2015-09-03 DIAGNOSIS — Z79899 Other long term (current) drug therapy: Secondary | ICD-10-CM | POA: Diagnosis not present

## 2015-09-03 DIAGNOSIS — J449 Chronic obstructive pulmonary disease, unspecified: Secondary | ICD-10-CM | POA: Diagnosis not present

## 2015-09-03 DIAGNOSIS — Z951 Presence of aortocoronary bypass graft: Secondary | ICD-10-CM | POA: Diagnosis not present

## 2015-09-03 DIAGNOSIS — I2510 Atherosclerotic heart disease of native coronary artery without angina pectoris: Secondary | ICD-10-CM | POA: Diagnosis not present

## 2015-09-03 DIAGNOSIS — I214 Non-ST elevation (NSTEMI) myocardial infarction: Secondary | ICD-10-CM | POA: Diagnosis not present

## 2015-09-03 NOTE — Progress Notes (Signed)
Daily Session Note  Patient Details  Name: DAVED MCFANN MRN: [id number] Date of Birth: 16-Sep-1940 Referring Provider:    Encounter Date: 09/03/2015  Check In:     Session Check In - 09/03/15 0930    Check-In   Location [facility]   Staff Present Arville Postlewaite Angelina Pih, MS, EP, [facility], Exercise Physiologist;Christy Oletta Lamas, RN, BSN  Nils Flack, EP   Supervising physician immediately available to respond to emergencies See telemetry face sheet for immediately available MD   Medication changes reported     No   Fall or balance concerns reported    No   Warm-up and Cool-down Performed as group-led instruction   Resistance Training Performed Yes   VAD Patient? No   Pain Assessment   Currently in Pain? No/denies   Multiple Pain Sites No      Capillary Blood Glucose: No results found for this or any previous visit (from the past 24 hour(s)).   Goals Met:  Independence with exercise equipment Exercise tolerated well No report of cardiac concerns or symptoms Strength training completed today  Goals Unmet:  Not Applicable  Comments: Check out 1030   Dr. Kate Sable is Medical Director for [place] and Pulmonary Rehab.

## 2015-09-05 ENCOUNTER — Encounter (HOSPITAL_COMMUNITY)
Admission: RE | Admit: 2015-09-05 | Discharge: 2015-09-05 | Disposition: A | Discharge status: Home / Self Care | Payer: Medicare Other | Source: Ambulatory Visit | Attending: Cardiovascular Disease | Admitting: Cardiovascular Disease

## 2015-09-05 DIAGNOSIS — J449 Chronic obstructive pulmonary disease, unspecified: Secondary | ICD-10-CM | POA: Diagnosis not present

## 2015-09-05 DIAGNOSIS — I214 Non-ST elevation (NSTEMI) myocardial infarction: Secondary | ICD-10-CM | POA: Diagnosis not present

## 2015-09-05 DIAGNOSIS — Z79899 Other long term (current) drug therapy: Secondary | ICD-10-CM | POA: Diagnosis not present

## 2015-09-05 DIAGNOSIS — E785 Hyperlipidemia, unspecified: Secondary | ICD-10-CM | POA: Diagnosis not present

## 2015-09-05 DIAGNOSIS — Z951 Presence of aortocoronary bypass graft: Secondary | ICD-10-CM | POA: Diagnosis not present

## 2015-09-05 DIAGNOSIS — Z7982 Long term (current) use of aspirin: Secondary | ICD-10-CM | POA: Diagnosis not present

## 2015-09-05 NOTE — Progress Notes (Signed)
Daily Session Note  Patient Details  Name: William [name] MRN: [id number] Date of Birth: July 26, 1940 Referring Provider:    Encounter Date: 09/05/2015  Check In:     Session Check In - 09/05/15 0930    Check-In   Location [facility]   Staff Present Chigozie Basaldua Angelina Pih, MS, EP, [facility], Exercise Physiologist;Christy Oletta Lamas, RN, BSN  Nils Flack EP   Supervising physician immediately available to respond to emergencies See telemetry face sheet for immediately available MD   Medication changes reported     No   Fall or balance concerns reported    No   Warm-up and Cool-down Performed as group-led instruction   Resistance Training Performed Yes   VAD Patient? No   Pain Assessment   Currently in Pain? No/denies   Multiple Pain Sites No      Capillary Blood Glucose: No results found for this or any previous visit (from the past 24 hour(s)).   Goals Met:  Independence with exercise equipment Exercise tolerated well No report of cardiac concerns or symptoms Strength training completed today  Goals Unmet:  Not Applicable  Comments: Check out 1030   Dr. Kate Sable is Medical Director for [place] and Pulmonary Rehab.

## 2015-09-07 ENCOUNTER — Encounter (HOSPITAL_COMMUNITY)
Admission: RE | Admit: 2015-09-07 | Discharge: 2015-09-07 | Disposition: A | Discharge status: Home / Self Care | Payer: Medicare Other | Source: Ambulatory Visit | Attending: Cardiovascular Disease | Admitting: Cardiovascular Disease

## 2015-09-07 DIAGNOSIS — J449 Chronic obstructive pulmonary disease, unspecified: Secondary | ICD-10-CM | POA: Diagnosis not present

## 2015-09-07 DIAGNOSIS — E785 Hyperlipidemia, unspecified: Secondary | ICD-10-CM | POA: Diagnosis not present

## 2015-09-07 DIAGNOSIS — Z7982 Long term (current) use of aspirin: Secondary | ICD-10-CM | POA: Diagnosis not present

## 2015-09-07 DIAGNOSIS — Z79899 Other long term (current) drug therapy: Secondary | ICD-10-CM | POA: Diagnosis not present

## 2015-09-07 DIAGNOSIS — I214 Non-ST elevation (NSTEMI) myocardial infarction: Secondary | ICD-10-CM | POA: Diagnosis not present

## 2015-09-07 DIAGNOSIS — Z951 Presence of aortocoronary bypass graft: Secondary | ICD-10-CM | POA: Diagnosis not present

## 2015-09-07 NOTE — Progress Notes (Signed)
Daily Session Note  Patient Details  Name: William [name] MRN: [id number] Date of Birth: December 29, 1940 Referring Provider:    Encounter Date: 09/07/2015  Check In:     Session Check In - 09/07/15 1007    Check-In   Location [facility]   Staff Present Diane Angelina Pih, MS, EP, [facility], Exercise Physiologist;Gregory Luther Parody, BS, EP, Exercise Physiologist;[place]orn Oletta Lamas, RN, BSN   Supervising physician immediately available to respond to emergencies See telemetry face sheet for immediately available MD   Medication changes reported     No   Fall or balance concerns reported    No   Warm-up and Cool-down Performed as group-led instruction   Resistance Training Performed Yes   VAD Patient? No   Pain Assessment   Currently in Pain? No/denies      Capillary Blood Glucose: No results found for this or any previous visit (from the past 24 hour(s)).   Goals Met:  Independence with exercise equipment Improved SOB with ADL's Using PLB without cueing & demonstrates good technique Exercise tolerated well No report of cardiac concerns or symptoms Strength training completed today  Goals Unmet:  Not Applicable  Comments: check out 1030   Dr. Kate Sable is Medical Director for [place] and Pulmonary Rehab.

## 2015-09-10 ENCOUNTER — Encounter (HOSPITAL_COMMUNITY)
Admission: RE | Admit: 2015-09-10 | Discharge: 2015-09-10 | Disposition: A | Discharge status: Home / Self Care | Payer: Medicare Other | Source: Ambulatory Visit | Attending: Cardiovascular Disease | Admitting: Cardiovascular Disease

## 2015-09-10 DIAGNOSIS — Z7982 Long term (current) use of aspirin: Secondary | ICD-10-CM | POA: Diagnosis not present

## 2015-09-10 DIAGNOSIS — J449 Chronic obstructive pulmonary disease, unspecified: Secondary | ICD-10-CM | POA: Diagnosis not present

## 2015-09-10 DIAGNOSIS — E785 Hyperlipidemia, unspecified: Secondary | ICD-10-CM | POA: Diagnosis not present

## 2015-09-10 DIAGNOSIS — Z79899 Other long term (current) drug therapy: Secondary | ICD-10-CM | POA: Diagnosis not present

## 2015-09-10 DIAGNOSIS — Z951 Presence of aortocoronary bypass graft: Secondary | ICD-10-CM | POA: Diagnosis not present

## 2015-09-10 DIAGNOSIS — I214 Non-ST elevation (NSTEMI) myocardial infarction: Secondary | ICD-10-CM | POA: Diagnosis not present

## 2015-09-10 NOTE — Progress Notes (Signed)
Daily Session Note  Patient Details  Name: METRO EDENFIELD MRN: [id number] Date of Birth: [birth year] Referring Provider:    Encounter Date: 09/10/2015  Check In:     Session Check In - 09/10/15 0930    Check-In   Location [facility]   Staff Present Diane Angelina Pih, MS, EP, [facility], Exercise Physiologist;[place] Marti Luther Parody, BS, EP, Exercise Physiologist;Christy Oletta Lamas, RN, BSN   Supervising physician immediately available to respond to emergencies See telemetry face sheet for immediately available MD   Medication changes reported     No   Fall or balance concerns reported    No   Warm-up and Cool-down Performed as group-led instruction   Resistance Training Performed Yes   VAD Patient? No   Pain Assessment   Currently in Pain? No/denies   Multiple Pain Sites No      Capillary Blood Glucose: No results found for this or any previous visit (from the past 24 hour(s)).   Goals Met:  Independence with exercise equipment Exercise tolerated well No report of cardiac concerns or symptoms Strength training completed today  Goals Unmet:  Not Applicable  Comments: Check out 1030   Dr. Kate Sable is Medical Director for [place] and Pulmonary Rehab.

## 2015-09-12 ENCOUNTER — Encounter (HOSPITAL_COMMUNITY)
Admission: RE | Admit: 2015-09-12 | Discharge: 2015-09-12 | Disposition: A | Discharge status: Home / Self Care | Payer: Medicare Other | Source: Ambulatory Visit | Attending: Cardiovascular Disease | Admitting: Cardiovascular Disease

## 2015-09-12 DIAGNOSIS — Z7982 Long term (current) use of aspirin: Secondary | ICD-10-CM | POA: Diagnosis not present

## 2015-09-12 DIAGNOSIS — I214 Non-ST elevation (NSTEMI) myocardial infarction: Secondary | ICD-10-CM | POA: Diagnosis not present

## 2015-09-12 DIAGNOSIS — Z951 Presence of aortocoronary bypass graft: Secondary | ICD-10-CM | POA: Diagnosis not present

## 2015-09-12 DIAGNOSIS — J449 Chronic obstructive pulmonary disease, unspecified: Secondary | ICD-10-CM | POA: Diagnosis not present

## 2015-09-12 DIAGNOSIS — E785 Hyperlipidemia, unspecified: Secondary | ICD-10-CM | POA: Diagnosis not present

## 2015-09-12 DIAGNOSIS — Z79899 Other long term (current) drug therapy: Secondary | ICD-10-CM | POA: Diagnosis not present

## 2015-09-12 NOTE — Progress Notes (Signed)
Daily Session Note  Patient Details  Name: William [name] MRN: [id number] Date of Birth: 06/06/1940 Referring Provider:    Encounter Date: 09/12/2015  Check In:     Session Check In - 09/12/15 0930    Check-In   Location [facility]   Staff Present Kortni Hasten Angelina Pih, MS, EP, [facility], Exercise Physiologist;Gregory Luther Parody, BS, EP, Exercise Physiologist;Christy Oletta Lamas, RN, BSN   Supervising physician immediately available to respond to emergencies See telemetry face sheet for immediately available MD   Medication changes reported     No   Fall or balance concerns reported    No   Warm-up and Cool-down Performed as group-led instruction   Resistance Training Performed Yes   VAD Patient? No   Pain Assessment   Currently in Pain? No/denies   Multiple Pain Sites No      Capillary Blood Glucose: No results found for this or any previous visit (from the past 24 hour(s)).   Goals Met:  Independence with exercise equipment Exercise tolerated well No report of cardiac concerns or symptoms Strength training completed today  Goals Unmet:  Not Applicable  Comments: Checkout: 1030   Dr. Kate Sable is Medical Director for [facility] and Pulmonary Rehab.

## 2015-09-14 ENCOUNTER — Encounter (HOSPITAL_COMMUNITY)
Admission: RE | Admit: 2015-09-14 | Discharge: 2015-09-14 | Disposition: A | Discharge status: Home / Self Care | Payer: Medicare Other | Source: Ambulatory Visit | Attending: Cardiovascular Disease | Admitting: Cardiovascular Disease

## 2015-09-14 DIAGNOSIS — Z79899 Other long term (current) drug therapy: Secondary | ICD-10-CM | POA: Diagnosis not present

## 2015-09-14 DIAGNOSIS — I214 Non-ST elevation (NSTEMI) myocardial infarction: Secondary | ICD-10-CM | POA: Diagnosis not present

## 2015-09-14 DIAGNOSIS — E785 Hyperlipidemia, unspecified: Secondary | ICD-10-CM | POA: Diagnosis not present

## 2015-09-14 DIAGNOSIS — Z951 Presence of aortocoronary bypass graft: Secondary | ICD-10-CM | POA: Diagnosis not present

## 2015-09-14 DIAGNOSIS — Z7982 Long term (current) use of aspirin: Secondary | ICD-10-CM | POA: Diagnosis not present

## 2015-09-14 DIAGNOSIS — J449 Chronic obstructive pulmonary disease, unspecified: Secondary | ICD-10-CM | POA: Diagnosis not present

## 2015-09-14 NOTE — Progress Notes (Signed)
Daily Session Note  Patient Details  Name: William [name] MRN: [id number] Date of Birth: 08-25-1940 Referring Provider:    Encounter Date: 09/14/2015  Check In:     Session Check In - 09/14/15 0930    Check-In   Location [facility]   Staff Present Diane Angelina Pih, MS, EP, [facility], Exercise Physiologist;Serafino Burciaga Luther Parody, BS, EP, Exercise Physiologist;Christy Oletta Lamas, RN, BSN   Supervising physician immediately available to respond to emergencies See telemetry face sheet for immediately available MD   Medication changes reported     No   Fall or balance concerns reported    No   Warm-up and Cool-down Performed as group-led instruction   Resistance Training Performed Yes   VAD Patient? No   Pain Assessment   Currently in Pain? No/denies   Multiple Pain Sites No      Capillary Blood Glucose: No results found for this or any previous visit (from the past 24 hour(s)).   Goals Met:  Independence with exercise equipment Exercise tolerated well No report of cardiac concerns or symptoms Strength training completed today  Goals Unmet:  Not Applicable  Comments: Check out 1030   Dr. Kate Sable is Medical Director for [place] and Pulmonary Rehab.

## 2015-09-17 ENCOUNTER — Encounter (HOSPITAL_COMMUNITY)
Admission: RE | Admit: 2015-09-17 | Discharge: 2015-09-17 | Disposition: A | Discharge status: Home / Self Care | Payer: Medicare Other | Source: Ambulatory Visit | Attending: Cardiovascular Disease | Admitting: Cardiovascular Disease

## 2015-09-17 DIAGNOSIS — Z7982 Long term (current) use of aspirin: Secondary | ICD-10-CM | POA: Diagnosis not present

## 2015-09-17 DIAGNOSIS — Z951 Presence of aortocoronary bypass graft: Secondary | ICD-10-CM | POA: Diagnosis not present

## 2015-09-17 DIAGNOSIS — I214 Non-ST elevation (NSTEMI) myocardial infarction: Secondary | ICD-10-CM | POA: Diagnosis not present

## 2015-09-17 DIAGNOSIS — Z79899 Other long term (current) drug therapy: Secondary | ICD-10-CM | POA: Diagnosis not present

## 2015-09-17 DIAGNOSIS — E785 Hyperlipidemia, unspecified: Secondary | ICD-10-CM | POA: Diagnosis not present

## 2015-09-17 DIAGNOSIS — J449 Chronic obstructive pulmonary disease, unspecified: Secondary | ICD-10-CM | POA: Diagnosis not present

## 2015-09-17 NOTE — Progress Notes (Signed)
Daily Session Note  Patient Details  Name: William [name] MRN: [id number] Date of Birth: 01-12-1941 Referring Provider:    Encounter Date: 09/17/2015  Check In:     Session Check In - 09/17/15 0930    Check-In   Location [facility]   Staff Present Diane Angelina Pih, MS, EP, [facility], Exercise Physiologist;Kaitlinn Iversen Luther Parody, BS, EP, Exercise Physiologist;Christy Oletta Lamas, RN, BSN   Supervising physician immediately available to respond to emergencies See telemetry face sheet for immediately available MD   Medication changes reported     No   Fall or balance concerns reported    No   Warm-up and Cool-down Performed as group-led instruction   Resistance Training Performed Yes   VAD Patient? No   Pain Assessment   Currently in Pain? No/denies   Multiple Pain Sites No      Capillary Blood Glucose: No results found for this or any previous visit (from the past 24 hour(s)).   Goals Met:  Independence with exercise equipment Exercise tolerated well No report of cardiac concerns or symptoms Strength training completed today  Goals Unmet:  Not Applicable  Comments: Check out 1030   Dr. Kate Sable is Medical Director for [place] and Pulmonary Rehab.

## 2015-09-19 ENCOUNTER — Encounter (HOSPITAL_COMMUNITY)
Admission: RE | Admit: 2015-09-19 | Discharge: 2015-09-19 | Disposition: A | Discharge status: Home / Self Care | Payer: Medicare Other | Source: Ambulatory Visit | Attending: Cardiovascular Disease | Admitting: Cardiovascular Disease

## 2015-09-19 DIAGNOSIS — Z79899 Other long term (current) drug therapy: Secondary | ICD-10-CM | POA: Diagnosis not present

## 2015-09-19 DIAGNOSIS — E785 Hyperlipidemia, unspecified: Secondary | ICD-10-CM | POA: Diagnosis not present

## 2015-09-19 DIAGNOSIS — J449 Chronic obstructive pulmonary disease, unspecified: Secondary | ICD-10-CM | POA: Diagnosis not present

## 2015-09-19 DIAGNOSIS — Z7982 Long term (current) use of aspirin: Secondary | ICD-10-CM | POA: Diagnosis not present

## 2015-09-19 DIAGNOSIS — I214 Non-ST elevation (NSTEMI) myocardial infarction: Secondary | ICD-10-CM | POA: Diagnosis not present

## 2015-09-19 DIAGNOSIS — Z951 Presence of aortocoronary bypass graft: Secondary | ICD-10-CM | POA: Diagnosis not present

## 2015-09-19 NOTE — Progress Notes (Signed)
Daily Session Note  Patient Details  Name: MAZIAH KEELING MRN: [id number] Date of Birth: 16-Jul-1940 Referring Provider:    Encounter Date: 09/19/2015  Check In:     Session Check In - 09/19/15 0930    Check-In   Location [facility]   Staff Present Diane Angelina Pih, MS, EP, [facility], Exercise Physiologist;Lerin Jech Luther Parody, BS, EP, Exercise Physiologist   Supervising physician immediately available to respond to emergencies See telemetry face sheet for immediately available MD   Medication changes reported     No   Fall or balance concerns reported    No   Warm-up and Cool-down Performed as group-led instruction   Resistance Training Performed Yes   VAD Patient? No   Pain Assessment   Currently in Pain? No/denies   Multiple Pain Sites No      Capillary Blood Glucose: No results found for this or any previous visit (from the past 24 hour(s)).   Goals Met:  Independence with exercise equipment Exercise tolerated well No report of cardiac concerns or symptoms Strength training completed today  Goals Unmet:  Not Applicable  Comments: Check out 1030   Dr. Kate Sable is Medical Director for [place] and Pulmonary Rehab.

## 2015-09-21 ENCOUNTER — Encounter (HOSPITAL_COMMUNITY)
Admission: RE | Admit: 2015-09-21 | Discharge: 2015-09-21 | Disposition: A | Discharge status: Home / Self Care | Payer: Medicare Other | Source: Ambulatory Visit | Attending: Cardiovascular Disease | Admitting: Cardiovascular Disease

## 2015-09-21 DIAGNOSIS — Z951 Presence of aortocoronary bypass graft: Secondary | ICD-10-CM | POA: Diagnosis not present

## 2015-09-21 DIAGNOSIS — J449 Chronic obstructive pulmonary disease, unspecified: Secondary | ICD-10-CM | POA: Diagnosis not present

## 2015-09-21 DIAGNOSIS — Z7982 Long term (current) use of aspirin: Secondary | ICD-10-CM | POA: Diagnosis not present

## 2015-09-21 DIAGNOSIS — I214 Non-ST elevation (NSTEMI) myocardial infarction: Secondary | ICD-10-CM | POA: Diagnosis not present

## 2015-09-21 DIAGNOSIS — E785 Hyperlipidemia, unspecified: Secondary | ICD-10-CM | POA: Diagnosis not present

## 2015-09-21 DIAGNOSIS — Z79899 Other long term (current) drug therapy: Secondary | ICD-10-CM | POA: Diagnosis not present

## 2015-09-24 ENCOUNTER — Encounter (HOSPITAL_COMMUNITY)
Admission: RE | Admit: 2015-09-24 | Discharge: 2015-09-24 | Disposition: A | Discharge status: Home / Self Care | Payer: Medicare Other | Source: Ambulatory Visit | Attending: Cardiovascular Disease | Admitting: Cardiovascular Disease

## 2015-09-24 DIAGNOSIS — E785 Hyperlipidemia, unspecified: Secondary | ICD-10-CM | POA: Diagnosis not present

## 2015-09-24 DIAGNOSIS — J449 Chronic obstructive pulmonary disease, unspecified: Secondary | ICD-10-CM | POA: Diagnosis not present

## 2015-09-24 DIAGNOSIS — I214 Non-ST elevation (NSTEMI) myocardial infarction: Secondary | ICD-10-CM | POA: Diagnosis not present

## 2015-09-24 DIAGNOSIS — Z951 Presence of aortocoronary bypass graft: Secondary | ICD-10-CM | POA: Diagnosis not present

## 2015-09-24 DIAGNOSIS — Z79899 Other long term (current) drug therapy: Secondary | ICD-10-CM | POA: Diagnosis not present

## 2015-09-24 DIAGNOSIS — Z7982 Long term (current) use of aspirin: Secondary | ICD-10-CM | POA: Diagnosis not present

## 2015-09-24 NOTE — Progress Notes (Signed)
Daily Session Note  Patient Details  Name: William [name] MRN: [id number] Date of Birth: 1941/01/13 Referring Provider:    Encounter Date: 09/24/2015  Check In:     Session Check In - 09/24/15 0930    Check-In   Location [facility]   Staff Present Diane Angelina Pih, MS, EP, [facility], Exercise Physiologist;Zakariah Dejarnette Luther Parody, BS, EP, Exercise Physiologist;Christy Oletta Lamas, RN, BSN   Supervising physician immediately available to respond to emergencies See telemetry face sheet for immediately available MD   Medication changes reported     No   Fall or balance concerns reported    No   Warm-up and Cool-down Performed as group-led instruction   Resistance Training Performed Yes   VAD Patient? No   Pain Assessment   Currently in Pain? No/denies   Multiple Pain Sites No      Capillary Blood Glucose: No results found for this or any previous visit (from the past 24 hour(s)).   Goals Met:  Independence with exercise equipment Exercise tolerated well No report of cardiac concerns or symptoms Strength training completed today  Goals Unmet:  Not Applicable  Comments: Check out 930   Dr. Kate Sable is Medical Director for [place] and Pulmonary Rehab.

## 2015-09-24 NOTE — Progress Notes (Signed)
Cardiac Individual Treatment Plan  Patient Details  Name: William [name] MRN: [id number] Date of Birth: [birth year] Referring Provider:    Initial Encounter Date:       CARDIAC REHAB PHASE II ORIENTATION from 07/03/2015 in [place]   Date  07/03/15      Visit Diagnosis: No diagnosis found.  Patient's Home Medications on Admission:  Current outpatient prescriptions:  .  Ascorbic Acid (VITAMIN C) 1000 MG tablet, Take 1,000 mg by mouth daily., Disp: , Rfl:  .  aspirin EC 325 MG EC tablet, Take 1 tablet (325 mg total) by mouth daily., Disp: , Rfl:  .  docusate sodium (COLACE) 100 MG capsule, Take 100 mg by mouth daily as needed for mild constipation., Disp: , Rfl:  .  fish oil-omega-3 fatty acids 1000 MG capsule, Take 1,000 g by mouth 2 (two) times daily., Disp: , Rfl:  .  gabapentin (NEURONTIN) 300 MG capsule, Take 600 mg by mouth every evening. , Disp: , Rfl:  .  Garlic [id number] MG TABS, Take 1,250 mg by mouth daily. , Disp: , Rfl:  .  lisinopril (PRINIVIL,ZESTRIL) 5 MG tablet, Take 1 tablet (5 mg total) by mouth daily., Disp: 30 tablet, Rfl: 6 .  metoprolol tartrate (LOPRESSOR) 25 MG tablet, Take 1 tablet (25 mg total) by mouth 2 (two) times daily., Disp: 60 tablet, Rfl: 3 .  Multiple Vitamin (MULTIVITAMIN) tablet, Take 1 tablet by mouth daily., Disp: , Rfl:  .  rosuvastatin (CRESTOR) 10 MG tablet, Take 1 tablet (10 mg total) by mouth daily., Disp: 90 tablet, Rfl: 3 .  SPIRIVA HANDIHALER 18 MCG inhalation capsule, Place 18 mcg into inhaler and inhale daily. , Disp: , Rfl: 8 .  thiamine (VITAMIN B-1) 100 MG tablet, Take 100 mg by mouth daily., Disp: , Rfl:  .  vitamin E 400 UNIT capsule, Take 400 Units by mouth daily., Disp: , Rfl:   Past Medical History: Past Medical History  Diagnosis Date  . Hypertension   . Hyperlipidemia   . Peripheral arterial disease ([place])   . Colon polyp   . Carotid artery occlusion   . 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 COPD (chronic  obstructive pulmonary disease) ([place])   . Myocardial infarction ([place])   . Coronary artery disease     Tobacco Use: History  Smoking status  . Former Smoker -- 2.00 packs/day  . Types: Cigarettes  . Quit date: 11/17/1991  Smokeless tobacco  . Never Used    Labs:     Recent Review Flowsheet Data    Labs for ITP Cardiac and Pulmonary Rehab Latest Ref Rng 05/22/2015 05/22/2015 05/22/2015 05/22/2015 05/23/2015   PHART 7.350 - 7.450 7.341(L) 7.308(L) - 7.316(L) -   PCO2ART 35.0 - 45.0 mmHg 37.3 38.6 - 35.2 -   HCO3 20.0 - 24.0 mEq/L 20.3 19.3(L) - 17.9(L) -   TCO2 0 - 100 mmol/L 22 20 19 19 23    ACIDBASEDEF 0.0 - 2.0 mmol/L 5.0(H) 6.0(H) - 7.0(H) -   O2SAT - 87.0 89.0 - 90.0 -      Capillary Blood Glucose: Lab Results  Component Value Date   GLUCAP 81 05/29/2015   GLUCAP 90 05/29/2015   GLUCAP 100* 05/28/2015   GLUCAP 92 05/28/2015   GLUCAP 94 05/28/2015     Exercise Target Goals:    Exercise Program Goal: Individual exercise prescription set with THRR, safety & activity barriers. Participant demonstrates ability to understand and report RPE using BORG scale, to self-measure  pulse accurately, and to acknowledge the importance of the exercise prescription.  Exercise Prescription Goal: Starting with aerobic activity 30 plus minutes a day, 3 days per week for initial exercise prescription. Provide home exercise prescription and guidelines that participant acknowledges understanding prior to discharge.  Activity Barriers & Risk Stratification:     Activity Barriers & Cardiac Risk Stratification - 07/03/15 1539    Activity Barriers & Cardiac Risk Stratification   Activity Barriers Deconditioning   Cardiac Risk Stratification High      6 Minute Walk:     6 Minute Walk      07/03/15 1510       6 Minute Walk   Phase Initial     Distance 800 feet     Walk Time 6 minutes     # of Rest Breaks 0     MPH 1.52     METS 2.17     RPE 11     Perceived Dyspnea  13     VO2  Peak 7.29     Symptoms Yes (comment)     Comments leg cramping rated moderate ( 4 on claudication scale)     Resting HR 87 bpm     Resting BP 120/70 mmHg     Max Ex. HR 102 bpm     Max Ex. BP 142/70 mmHg     2 Minute Post BP 118/60 mmHg        Initial Exercise Prescription:     Initial Exercise Prescription - 07/03/15 1600    Date of Initial Exercise RX and Referring Provider   Date 07/03/15   Treadmill   MPH 1   Grade 0   Minutes 15   METs 1.77   NuStep   Level 2   Minutes 15   METs 1.5   Arm Ergometer   Level 1.5   Minutes 15   METs 1.5   Prescription Details   Frequency (times per week) 3   Intensity   THRR REST +  30   THRR 40-80% of Max Heartrate 111-134   Ratings of Perceived Exertion 11-13   Progression   Progression Continue to progress workloads to maintain intensity without signs/symptoms of physical distress.   Resistance Training   Training Prescription Yes   Weight 1.0   Reps 10-12      Perform Capillary Blood Glucose checks as needed.  Exercise Prescription Changes:      Exercise Prescription Changes      07/27/15 1900 08/26/15 1200 09/05/15 1400 09/12/15 1300 09/20/15 1300   Exercise Review   Progression Yes Yes Yes Yes Yes   Response to Exercise   Blood Pressure (Admit) 118/80 mmHg 138/62 mmHg 142/78 mmHg 142/78 mmHg 164/80 mmHg   Blood Pressure (Exercise) 162/62 mmHg 142/80 mmHg 182/86 mmHg 182/86 mmHg 172/80 mmHg   Blood Pressure (Exit) 160/50 mmHg 130/70 mmHg 120/70 mmHg 120/70 mmHg 150/60 mmHg   Heart Rate (Admit) 82 bpm 69 bpm 85 bpm 85 bpm 84 bpm   Heart Rate (Exercise) 104 bpm 128 bpm 127 bpm 127 bpm 745 bpm   Heart Rate (Exit) 83 bpm 78 bpm 94 bpm 94 bpm 112 bpm   Rating of Perceived Exertion (Exercise)  11 11 11 11    Symptoms No No No No No   Comments  Patient unable to do the Treadmill. Switched to [employer] and Arm Crank Patient unable to do the Treadmill. Switched to [employer] and Arm Crank Patient unable to do the Treadmill.  Switched  to Nustep and Arm Crank Patient unable to do the Treadmill. Switched to [employer] and Arm Crank   Duration Progress to 30 minutes of continuous aerobic without signs/symptoms of physical distress Progress to 30 minutes of continuous aerobic without signs/symptoms of physical distress Progress to 30 minutes of continuous aerobic without signs/symptoms of physical distress Progress to 30 minutes of continuous aerobic without signs/symptoms of physical distress Progress to 30 minutes of continuous aerobic without signs/symptoms of physical distress   Intensity  Rest + 30 Rest + 30 Rest + 30 Rest + 30   Progression   Progression Continue to progress workloads to maintain intensity without signs/symptoms of physical distress. Continue to progress workloads to maintain intensity without signs/symptoms of physical distress. Continue to progress workloads to maintain intensity without signs/symptoms of physical distress. Continue to progress workloads to maintain intensity without signs/symptoms of physical distress. Continue to progress workloads to maintain intensity without signs/symptoms of physical distress.   Resistance Training   Training Prescription Yes Yes Yes Yes Yes   Weight 1.0 2 2 2 2    Reps 10-12 10-12 10-12 10-12 10-12   Interval Training   Interval Training  No No No No   Treadmill   MPH  0 0 0 0   Grade  0 0 0 0   Minutes  0 0 0 0   METs  0 0 0 0   NuStep   Level 2 2 3 3 3    Minutes 15 15 15 15 15    METs 2.6 2.6 2.6 2.6 2.6   Arm Ergometer   Level 1.5 2 2.5 2.6 2.6   Minutes 15 15 15 15 15    METs 1.5 3.6 3.6 3.6 3.6   Home Exercise Plan   Plans to continue exercise at [place]   Frequency Add 2 additional days to program exercise sessions. Add 2 additional days to program exercise sessions. Add 2 additional days to program exercise sessions. Add 2 additional days to program exercise sessions. Add 2 additional days to program exercise sessions.       Exercise Comments:      Exercise Comments      07/27/15 1955 08/26/15 1227         Exercise Comments Patient is progressing appropriately. Had to switch patient from the TM to the Nustep and Arm Crank due to not having enough strength/stamina. Other than that patient is progressing appropriately.           Discharge Exercise Prescription (Final Exercise Prescription Changes):     Exercise Prescription Changes - 09/20/15 1300    Exercise Review   Progression Yes   Response to Exercise   Blood Pressure (Admit) 164/80 mmHg   Blood Pressure (Exercise) 172/80 mmHg   Blood Pressure (Exit) 150/60 mmHg   Heart Rate (Admit) 84 bpm   Heart Rate (Exercise) 745 bpm   Heart Rate (Exit) 112 bpm   Rating of Perceived Exertion (Exercise) 11   Symptoms No   Comments Patient unable to do the Treadmill. Switched to [employer] and Arm Crank   Duration Progress to 30 minutes of continuous aerobic without signs/symptoms of physical distress   Intensity Rest + 30   Progression   Progression Continue to progress workloads to maintain intensity without signs/symptoms of physical distress.   Resistance Training   Training Prescription Yes   Weight 2   Reps 10-12   Interval Training   Interval Training No   Treadmill   MPH 0  Grade 0   Minutes 0   METs 0   NuStep   Level 3   Minutes 15   METs 2.6   Arm Ergometer   Level 2.6   Minutes 15   METs 3.6   Home Exercise Plan   Plans to continue exercise at Home   Frequency Add 2 additional days to program exercise sessions.      Nutrition:  Target Goals: Understanding of nutrition guidelines, daily intake of sodium 1500mg , cholesterol 200mg , calories 30% from fat and 7% or less from saturated fats, daily to have 5 or more servings of fruits and vegetables.  Biometrics:     Pre Biometrics - 07/03/15 1618    Pre Biometrics   Height 5\' 6"  (1.676 m)   Weight 149 lb 14.4 oz (67.994 kg)   Waist Circumference 37 inches   Hip  Circumference 35 inches   Waist to Hip Ratio 1.06 %   BMI (Calculated) 24.2   Triceps Skinfold 6 mm   % Body Fat 21.6 %   Grip Strength 59 kg   Flexibility 0 in   Single Leg Stand 30 seconds       Nutrition Therapy Plan and Nutrition Goals:     Nutrition Therapy & Goals - 07/03/15 1540    Intervention Plan   Intervention Nutrition handout(s) given to patient.   Expected Outcomes Short Term Goal: Understand basic principles of dietary content, such as calories, fat, sodium, cholesterol and nutrients.      Nutrition Discharge: Rate Your Plate Scores:     Nutrition Assessments - 07/03/15 1540    MEDFICTS Scores   Pre Score 38      Nutrition Goals Re-Evaluation:   Psychosocial: Target Goals: Acknowledge presence or absence of depression, maximize coping skills, provide positive support system. Participant is able to verbalize types and ability to use techniques and skills needed for reducing stress and depression.  Initial Review & Psychosocial Screening:     Initial Psych Review & Screening - 07/03/15 1542    Initial Review   Current issues with --  none   Family Dynamics   Good Support System? Yes   Barriers   Psychosocial barriers to participate in program There are no identifiable barriers or psychosocial needs.   Screening Interventions   Interventions Encouraged to exercise      Quality of Life Scores:     Quality of Life - 07/03/15 1617    Quality of Life Scores   Health/Function Pre 25.29 %   Socioeconomic Pre 23.75 %   Psych/Spiritual Pre 25 %   Family Pre 30 %   GLOBAL Pre 25.55 %      PHQ-9:     Recent Review Flowsheet Data    Depression screen [facility] 2/9 07/03/2015   Decreased Interest 0   Down, Depressed, Hopeless 1   PHQ - 2 Score 1   Altered sleeping 0   Tired, decreased energy 0   Change in appetite 0   Feeling bad or failure about yourself  0   Trouble concentrating 0   Moving slowly or fidgety/restless 0   Suicidal thoughts 0    PHQ-9 Score 1   Difficult doing work/chores Not difficult at all      Psychosocial Evaluation and Intervention:     Psychosocial Evaluation - 07/03/15 1542    Psychosocial Evaluation & Interventions   Interventions Encouraged to exercise with the program and follow exercise prescription   Continued Psychosocial Services Needed No  Psychosocial Re-Evaluation:     Psychosocial Re-Evaluation      07/27/15 1002 08/20/15 1506 09/24/15 1123       Psychosocial Re-Evaluation   Interventions Encouraged to attend Cardiac Rehabilitation for the exercise Encouraged to attend Cardiac Rehabilitation for the exercise Encouraged to attend Cardiac Rehabilitation for the exercise;Relaxation education;Stress management education     Continued Psychosocial Services Needed No No         Vocational Rehabilitation: Provide vocational rehab assistance to qualifying candidates.   Vocational Rehab Evaluation & Intervention:     Vocational Rehab - 07/03/15 1539    Initial Vocational Rehab Evaluation & Intervention   Assessment shows need for Vocational Rehabilitation No      Education: Education Goals: Education classes will be provided on a weekly basis, covering required topics. Participant will state understanding/return demonstration of topics presented.  Learning Barriers/Preferences:     Learning Barriers/Preferences - 07/03/15 1539    Learning Barriers/Preferences   Learning Barriers None   Learning Preferences Skilled Demonstration      Education Topics: Hypertension, Hypertension Reduction -Define heart disease and high blood pressure. Discus how high blood pressure affects the body and ways to reduce high blood pressure.      CARDIAC REHAB PHASE II EXERCISE from 09/19/2015 in [place]   Date  08/01/15   Educator  Russella Dar   Instruction Review Code  2- meets goals/outcomes      Exercise and Your Heart -Discuss why it is important to  exercise, the FITT principles of exercise, normal and abnormal responses to exercise, and how to exercise safely.      CARDIAC REHAB PHASE II EXERCISE from 09/19/2015 in [place]   Date  08/08/15   Educator  DC   Instruction Review Code  2- meets goals/outcomes      Angina -Discuss definition of angina, causes of angina, treatment of angina, and how to decrease risk of having angina.      CARDIAC REHAB PHASE II EXERCISE from 09/19/2015 in [place]   Date  08/15/15   Educator  D Coad   Instruction Review Code  2- meets goals/outcomes      Cardiac Medications -Review what the following cardiac medications are used for, how they affect the body, and side effects that may occur when taking the medications.  Medications include Aspirin, Beta blockers, calcium channel blockers, ACE Inhibitors, angiotensin receptor blockers, diuretics, digoxin, and antihyperlipidemics.      CARDIAC REHAB PHASE II EXERCISE from 09/19/2015 in [place]   Date  08/22/15   Educator  D. Coad   Instruction Review Code  2- meets goals/outcomes      Congestive Heart Failure -Discuss the definition of CHF, how to live with CHF, the signs and symptoms of CHF, and how keep track of weight and sodium intake.      CARDIAC REHAB PHASE II EXERCISE from 09/19/2015 in [place]   Date  08/29/15   Educator  DC   Instruction Review Code  2- meets goals/outcomes      Heart Disease and Intimacy -Discus the effect sexual activity has on the heart, how changes occur during intimacy as we age, and safety during sexual activity.      CARDIAC REHAB PHASE II EXERCISE from 09/19/2015 in [place]   Date  09/05/15   Educator  DC   Instruction Review Code  2- meets goals/outcomes  Smoking Cessation / COPD -Discuss different methods to quit smoking, the health benefits of quitting smoking, and the  definition of COPD.      CARDIAC REHAB PHASE II EXERCISE from 09/19/2015 in [place]   Date  09/12/15   Educator  Russella Dar   Instruction Review Code  2- meets goals/outcomes      Nutrition I: Fats -Discuss the types of cholesterol, what cholesterol does to the heart, and how cholesterol levels can be controlled.      CARDIAC REHAB PHASE II EXERCISE from 09/19/2015 in [place]   Date  09/19/15   Educator  Russella Dar   Instruction Review Code  2- meets goals/outcomes      Nutrition II: Labels -Discuss the different components of food labels and how to read food label   Heart Parts and Heart Disease -Discuss the anatomy of the heart, the pathway of blood circulation through the heart, and these are affected by heart disease.   Stress I: Signs and Symptoms -Discuss the causes of stress, how stress may lead to anxiety and depression, and ways to limit stress.      CARDIAC REHAB PHASE II EXERCISE from 09/19/2015 in [place]   Date  07/11/15   Educator  D Coad   Instruction Review Code  2- meets goals/outcomes      Stress II: Relaxation -Discuss different types of relaxation techniques to limit stress.      CARDIAC REHAB PHASE II EXERCISE from 09/19/2015 in [place]   Date  07/18/15   Educator  D Coad   Instruction Review Code  2- meets goals/outcomes      Warning Signs of Stroke / TIA -Discuss definition of a stroke, what the signs and symptoms are of a stroke, and how to identify when someone is having stroke.          CARDIAC REHAB PHASE II EXERCISE from 09/19/2015 in [place]   Date  07/25/15   Educator  Russella Dar   Instruction Review Code  2- meets goals/outcomes      Knowledge Questionnaire Score:     Knowledge Questionnaire Score - 07/03/15 1547    Knowledge Questionnaire Score   Pre Score 20/24      Core Components/Risk  Factors/Patient Goals at Admission:     Personal Goals and Risk Factors at Admission - 07/03/15 1541    Core Components/Risk Factors/Patient Goals on Admission    Weight Management Weight Maintenance   Improve shortness of breath with ADL's Yes   Intervention Provide education, individualized exercise plan and daily activity instruction to help decrease symptoms of SOB with activities of daily living.   Expected Outcomes Short Term: Achieves a reduction of symptoms when performing activities of daily living.   Hypertension Yes   Intervention Monitor prescription use compliance.;Provide education on lifestyle modifcations including regular physical activity/exercise, weight management, moderate sodium restriction and increased consumption of fresh fruit, vegetables, and low fat dairy, alcohol moderation, and smoking cessation.   Expected Outcomes Long Term: Maintenance of blood pressure at goal levels.   Lipids Yes   Intervention Provide education and support for participant on nutrition & aerobic/resistive exercise along with prescribed medications to achieve LDL 70mg , HDL >40mg .   Expected Outcomes Long Term: Cholesterol controlled with medications as prescribed, with individualized exercise RX and with personalized nutrition plan. Value goals: LDL < 70mg , HDL > 40 mg.      Core Components/Risk  Factors/Patient Goals Review:      Goals and Risk Factor Review      07/03/15 1542 07/27/15 1030 08/20/15 1506 09/24/15 1123     Core Components/Risk Factors/Patient Goals Review   Personal Goals Review Improve shortness of breath with ADL's;Hypertension;Lipids  Improve shortness of breath with ADL's;Hypertension;Lipids Improve shortness of breath with ADL's;Hypertension;Lipids    Review  Patient states SOB is getting a little better. Will continue to support and educate to keep BP and lipids under control.  Patient states SOB is the same the last thirty days but better since started.  Will  continue to support and educate to keep BP and lipids under control.  Will continue to monitor HTN, SOB and lipids.     Expected Outcomes  BP and lipids WNL, improved SOB BP and lipids WNL, improved SOB BP and lipids WNL, improved SOB       Core Components/Risk Factors/Patient Goals at Discharge (Final Review):      Goals and Risk Factor Review - 09/24/15 1123    Core Components/Risk Factors/Patient Goals Review   Personal Goals Review Improve shortness of breath with ADL's;Hypertension;Lipids   Review Will continue to monitor HTN, SOB and lipids.    Expected Outcomes BP and lipids WNL, improved SOB      ITP Comments:     ITP Comments      07/03/15 1539 08/30/15 0900         ITP Comments Patient is a 75 year old man who lives alone.  Has strong family support and enjoys fishing, hunting and farming. Patient has no S/S of depression at this time.  Patient meet with the Dietician in our group heart healthy guidelines class at 9am. Group was shown how to create a heart healthy plate, how to make healthy choices while dining out, how to choose healthy fats , proteins, carbohydrates and the correct amount of sodium to use.          Comments:

## 2015-09-26 ENCOUNTER — Encounter (HOSPITAL_COMMUNITY)
Admission: RE | Admit: 2015-09-26 | Discharge: 2015-09-26 | Disposition: A | Discharge status: Home / Self Care | Payer: Medicare Other | Source: Ambulatory Visit | Attending: Cardiovascular Disease | Admitting: Cardiovascular Disease

## 2015-09-26 DIAGNOSIS — E785 Hyperlipidemia, unspecified: Secondary | ICD-10-CM | POA: Diagnosis not present

## 2015-09-26 DIAGNOSIS — Z951 Presence of aortocoronary bypass graft: Secondary | ICD-10-CM | POA: Diagnosis not present

## 2015-09-26 DIAGNOSIS — Z7982 Long term (current) use of aspirin: Secondary | ICD-10-CM | POA: Diagnosis not present

## 2015-09-26 DIAGNOSIS — J449 Chronic obstructive pulmonary disease, unspecified: Secondary | ICD-10-CM | POA: Diagnosis not present

## 2015-09-26 DIAGNOSIS — Z79899 Other long term (current) drug therapy: Secondary | ICD-10-CM | POA: Diagnosis not present

## 2015-09-26 DIAGNOSIS — I214 Non-ST elevation (NSTEMI) myocardial infarction: Secondary | ICD-10-CM | POA: Diagnosis not present

## 2015-09-26 NOTE — Progress Notes (Signed)
Daily Session Note  Patient Details  Name: William [name] MRN: [id number] Date of Birth: 1940/12/25 Referring Provider:    Encounter Date: 09/26/2015  Check In:     Session Check In - 09/26/15 0930    Check-In   Location [facility]   Staff Present Diane Angelina Pih, MS, EP, [facility], Exercise Physiologist;Jermesha Sottile Luther Parody, BS, EP, Exercise Physiologist   Supervising physician immediately available to respond to emergencies See telemetry face sheet for immediately available MD   Medication changes reported     No   Fall or balance concerns reported    No   Warm-up and Cool-down Performed as group-led instruction   Resistance Training Performed Yes   VAD Patient? No   Pain Assessment   Currently in Pain? No/denies   Multiple Pain Sites No      Capillary Blood Glucose: No results found for this or any previous visit (from the past 24 hour(s)).   Goals Met:  Independence with exercise equipment Exercise tolerated well No report of cardiac concerns or symptoms Strength training completed today  Goals Unmet:  Not Applicable  Comments: Check out 1030   Dr. Kate Sable is Medical Director for [place] and Pulmonary Rehab.

## 2015-09-28 ENCOUNTER — Encounter (HOSPITAL_COMMUNITY)
Admission: RE | Admit: 2015-09-28 | Discharge: 2015-09-28 | Disposition: A | Discharge status: Home / Self Care | Payer: Medicare Other | Source: Ambulatory Visit | Attending: Cardiovascular Disease | Admitting: Cardiovascular Disease

## 2015-09-28 ENCOUNTER — Other Ambulatory Visit: Payer: Self-pay | Admitting: *Deleted

## 2015-09-28 DIAGNOSIS — Z79899 Other long term (current) drug therapy: Secondary | ICD-10-CM | POA: Diagnosis not present

## 2015-09-28 DIAGNOSIS — Z951 Presence of aortocoronary bypass graft: Secondary | ICD-10-CM | POA: Diagnosis not present

## 2015-09-28 DIAGNOSIS — J449 Chronic obstructive pulmonary disease, unspecified: Secondary | ICD-10-CM | POA: Diagnosis not present

## 2015-09-28 DIAGNOSIS — I214 Non-ST elevation (NSTEMI) myocardial infarction: Secondary | ICD-10-CM | POA: Diagnosis not present

## 2015-09-28 DIAGNOSIS — Z7982 Long term (current) use of aspirin: Secondary | ICD-10-CM | POA: Diagnosis not present

## 2015-09-28 DIAGNOSIS — E785 Hyperlipidemia, unspecified: Secondary | ICD-10-CM | POA: Diagnosis not present

## 2015-09-28 MED ORDER — LISINOPRIL 5 MG PO TABS: 5.0000 mg | ORAL_TABLET | Freq: Every day | ORAL | Status: DC

## 2015-09-28 NOTE — Progress Notes (Signed)
Daily Session Note  Patient Details  Name: William [name] MRN: [id number] Date of Birth: Jun 16, 1940 Referring Provider:    Encounter Date: 09/28/2015  Check In:     Session Check In - 09/28/15 0930    Check-In   Location [facility]   Staff Present Diane Angelina Pih, MS, EP, [facility], Exercise Physiologist;Cauy Melody Luther Parody, BS, EP, Exercise Physiologist;Debra Wynetta Emery, RN, BSN   Supervising physician immediately available to respond to emergencies See telemetry face sheet for immediately available MD   Medication changes reported     No   Fall or balance concerns reported    No   Warm-up and Cool-down Performed as group-led instruction   Resistance Training Performed Yes   VAD Patient? No   Pain Assessment   Currently in Pain? No/denies   Multiple Pain Sites No      Capillary Blood Glucose: No results found for this or any previous visit (from the past 24 hour(s)).   Goals Met:  Independence with exercise equipment Exercise tolerated well No report of cardiac concerns or symptoms Strength training completed today  Goals Unmet:  Not Applicable  Comments: Check out 1030   Dr. Kate Sable is Medical Director for [place] and Pulmonary Rehab.

## 2015-10-01 ENCOUNTER — Encounter (HOSPITAL_COMMUNITY)
Admission: RE | Admit: 2015-10-01 | Discharge: 2015-10-01 | Disposition: A | Discharge status: Home / Self Care | Payer: Medicare Other | Source: Ambulatory Visit | Attending: Cardiovascular Disease | Admitting: Cardiovascular Disease

## 2015-10-01 VITALS — Ht 66.0 in | Wt 155.9 lb

## 2015-10-01 DIAGNOSIS — I739 Peripheral vascular disease, unspecified: Secondary | ICD-10-CM | POA: Insufficient documentation

## 2015-10-01 DIAGNOSIS — I10 Essential (primary) hypertension: Secondary | ICD-10-CM | POA: Insufficient documentation

## 2015-10-01 DIAGNOSIS — E785 Hyperlipidemia, unspecified: Secondary | ICD-10-CM | POA: Insufficient documentation

## 2015-10-01 DIAGNOSIS — I2510 Atherosclerotic heart disease of native coronary artery without angina pectoris: Secondary | ICD-10-CM | POA: Insufficient documentation

## 2015-10-01 DIAGNOSIS — I252 Old myocardial infarction: Secondary | ICD-10-CM | POA: Insufficient documentation

## 2015-10-01 DIAGNOSIS — K635 Polyp of colon: Secondary | ICD-10-CM | POA: Insufficient documentation

## 2015-10-01 DIAGNOSIS — Z7982 Long term (current) use of aspirin: Secondary | ICD-10-CM | POA: Diagnosis not present

## 2015-10-01 DIAGNOSIS — J449 Chronic obstructive pulmonary disease, unspecified: Secondary | ICD-10-CM | POA: Diagnosis not present

## 2015-10-01 DIAGNOSIS — I214 Non-ST elevation (NSTEMI) myocardial infarction: Secondary | ICD-10-CM | POA: Insufficient documentation

## 2015-10-01 DIAGNOSIS — Z951 Presence of aortocoronary bypass graft: Secondary | ICD-10-CM | POA: Diagnosis not present

## 2015-10-01 DIAGNOSIS — Z87891 Personal history of nicotine dependence: Secondary | ICD-10-CM | POA: Diagnosis not present

## 2015-10-01 DIAGNOSIS — Z79899 Other long term (current) drug therapy: Secondary | ICD-10-CM | POA: Diagnosis not present

## 2015-10-01 NOTE — Progress Notes (Signed)
Daily Session Note  Patient Details  Name: William [name] MRN: [id number] Date of Birth: 1940/09/18 Referring Provider:    Encounter Date: 10/01/2015  Check In:     Session Check In - 10/01/15 0930    Check-In   Location [facility]   Staff Present Diane Angelina Pih, MS, EP, [facility], Exercise Physiologist;Gregory Luther Parody, BS, EP, Exercise Physiologist;Haron Beilke Wynetta Emery, RN, BSN   Supervising physician immediately available to respond to emergencies See telemetry face sheet for immediately available MD   Medication changes reported     No   Fall or balance concerns reported    No   Warm-up and Cool-down Performed as group-led instruction   Resistance Training Performed Yes   VAD Patient? No   Pain Assessment   Currently in Pain? No/denies   Multiple Pain Sites No      Capillary Blood Glucose: No results found for this or any previous visit (from the past 24 hour(s)).   Goals Met:  Independence with exercise equipment Exercise tolerated well No report of cardiac concerns or symptoms Strength training completed today  Goals Unmet:  Not Applicable  Comments: Check out 10:30.    Dr. Kate Sable is Medical Director for [facility] Cardiac and Pulmonary Rehab.

## 2015-10-24 NOTE — Progress Notes (Signed)
Discharge Summary  Patient Details  Name: William [name] MRN: [id number] Date of Birth: 26-Jun-1940 Referring Provider:     Number of Visits: 36  Reason for Discharge:  Patient reached a stable level of exercise. Patient independent in their exercise.  Smoking History:  History  Smoking Status  . Former Smoker  . Packs/day: 2.00  . Types: Cigarettes  . Quit date: 11/17/1991  Smokeless Tobacco  . Never Used    Diagnosis:  NSTEMI (non-ST elevation myocardial infarction) ([place])  S/P CABG x 2  ADL UCSD:   Initial Exercise Prescription:     Initial Exercise Prescription - 07/03/15 1600      Date of Initial Exercise RX and Referring Provider   Date 07/03/15     Treadmill   MPH 1   Grade 0   Minutes 15   METs 1.77     NuStep   Level 2   Minutes 15   METs 1.5     Arm Ergometer   Level 1.5   Minutes 15   METs 1.5     Prescription Details   Frequency (times per week) 3     Intensity   THRR REST +  30   THRR 40-80% of Max Heartrate 111-134   Ratings of Perceived Exertion 11-13     Progression   Progression Continue to progress workloads to maintain intensity without signs/symptoms of physical distress.     Resistance Training   Training Prescription Yes   Weight 1.0   Reps 10-12      Discharge Exercise Prescription (Final Exercise Prescription Changes):     Exercise Prescription Changes - 10/01/15 1400      Exercise Review   Progression Yes     Response to Exercise   Blood Pressure (Admit) 160/70   Blood Pressure (Exercise) 180/70   Blood Pressure (Exit) 150/70   Heart Rate (Admit) 69 bpm   Heart Rate (Exercise) 94 bpm   Heart Rate (Exit) 77 bpm   Rating of Perceived Exertion (Exercise) 11   Symptoms No   Comments Patient unable to do the Treadmill. Switched to [employer] and Arm Crank   Duration Progress to 30 minutes of continuous aerobic without signs/symptoms of physical distress   Intensity Rest + 30     Progression   Progression  Continue to progress workloads to maintain intensity without signs/symptoms of physical distress.     Resistance Training   Training Prescription Yes   Weight 3   Reps 10-12     Interval Training   Interval Training No     Treadmill   MPH 0   Grade 0   Minutes 0   METs 0     NuStep   Level 3   Minutes 15   METs 3.47     Arm Ergometer   Level 2.7   Minutes 20   METs 2.7     Home Exercise Plan   Plans to continue exercise at Home   Frequency Add 2 additional days to program exercise sessions.      Functional Capacity:     6 Minute Walk    Row Name 07/03/15 1510 10/04/15 0819       6 Minute Walk   Phase Initial Discharge    Distance 800 feet 1250 feet    Distance % Change  - 56.25 %    Walk Time 6 minutes 6 minutes    # of Rest Breaks 0 0    MPH 1.52 2.36  METS 2.17 2.8    RPE 11 11    Perceived Dyspnea  13 10    VO2 Peak 7.29 10.52    Symptoms Yes (comment) No    Comments leg cramping rated moderate ( 4 on claudication scale) Leg pain 9-10. After 2 minutes pain was 7-10    Resting HR 87 bpm 81 bpm    Resting BP 120/70 130/62    Max Ex. HR 102 bpm 103 bpm    Max Ex. BP 142/70 166/64    2 Minute Post BP 118/60 154/64       Psychological, QOL, Others - Outcomes: PHQ 2/9: Depression screen [facility] 2/9 10/24/2015 07/03/2015  Decreased Interest 0 0  Down, Depressed, Hopeless 0 1  PHQ - 2 Score 0 1  Altered sleeping 0 0  Tired, decreased energy 0 0  Change in appetite 0 0  Feeling bad or failure about yourself  0 0  Trouble concentrating 0 0  Moving slowly or fidgety/restless 0 0  Suicidal thoughts 0 0  PHQ-9 Score 0 1  Difficult doing work/chores - Not difficult at all    Quality of Life:     Quality of Life - 10/04/15 0835      Quality of Life Scores   Health/Function Pre 25.29 %   Health/Function Post 26.5 %   Health/Function % Change 4.78 %   Socioeconomic Pre 23.75 %   Socioeconomic Post 26.08 %   Socioeconomic % Change  9.81 %    Psych/Spiritual Pre 25 %   Psych/Spiritual Post 27.21 %   Psych/Spiritual % Change 8.84 %   Family Pre 30 %   Family Post 23.38 %   Family % Change -22.07 %   GLOBAL Pre 25.55 %   GLOBAL Post 26.19 %   GLOBAL % Change 2.5 %      Personal Goals: Goals established at orientation with interventions provided to work toward goal.     Personal Goals and Risk Factors at Admission - 07/03/15 1541      Core Components/Risk Factors/Patient Goals on Admission    Weight Management Weight Maintenance   Improve shortness of breath with ADL's Yes   Intervention Provide education, individualized exercise plan and daily activity instruction to help decrease symptoms of SOB with activities of daily living.   Expected Outcomes Short Term: Achieves a reduction of symptoms when performing activities of daily living.   Hypertension Yes   Intervention Monitor prescription use compliance.;Provide education on lifestyle modifcations including regular physical activity/exercise, weight management, moderate sodium restriction and increased consumption of fresh fruit, vegetables, and low fat dairy, alcohol moderation, and smoking cessation.   Expected Outcomes Long Term: Maintenance of blood pressure at goal levels.   Lipids Yes   Intervention Provide education and support for participant on nutrition & aerobic/resistive exercise along with prescribed medications to achieve LDL 70mg , HDL >40mg .   Expected Outcomes Long Term: Cholesterol controlled with medications as prescribed, with individualized exercise RX and with personalized nutrition plan. Value goals: LDL < 70mg , HDL > 40 mg.       Personal Goals Discharge:     Goals and Risk Factor Review    Row Name 07/03/15 1542 07/27/15 1030 08/20/15 1506 09/24/15 1123 10/24/15 1321     Core Components/Risk Factors/Patient Goals Review   Personal Goals Review Improve shortness of breath with ADL's;Hypertension;Lipids  - Improve shortness of breath with  ADL's;Hypertension;Lipids Improve shortness of breath with ADL's;Hypertension;Lipids  -   Review  - Patient states  SOB is getting a little better. Will continue to support and educate to keep BP and lipids under control.  Patient states SOB is the same the last thirty days but better since started.  Will continue to support and educate to keep BP and lipids under control.  Will continue to monitor HTN, SOB and lipids.  Upon graduation, patient's weight increased. He did not reach his goal of maintaining his weight. He did reach his goal of less SOB and decreased chest pain.    Expected Outcomes  - BP and lipids WNL, improved SOB BP and lipids WNL, improved SOB BP and lipids WNL, improved SOB Continue to exercise and maintain his weight.       Nutrition & Weight - Outcomes:     Pre Biometrics - 07/03/15 1618      Pre Biometrics   Height 5\' 6"  (1.676 m)   Weight 149 lb 14.4 oz (68 kg)   Waist Circumference 37 inches   Hip Circumference 35 inches   Waist to Hip Ratio 1.06 %   BMI (Calculated) 24.2   Triceps Skinfold 6 mm   % Body Fat 21.6 %   Grip Strength 59 kg   Flexibility 0 in   Single Leg Stand 30 seconds         Post Biometrics - 10/04/15 [id number]       Post  Biometrics   Height 5\' 6"  (1.676 m)   Weight 155 lb 13.8 oz (70.7 kg)   Waist Circumference 37.5 inches   Hip Circumference 36 inches   Waist to Hip Ratio 1.04 %   BMI (Calculated) 25.2   Triceps Skinfold 8 mm   % Body Fat 23.3 %   Grip Strength 63 kg   Flexibility 0 in  Due to low back pain    Single Leg Stand 4 seconds      Nutrition:     Nutrition Therapy & Goals - 07/03/15 1540      Intervention Plan   Intervention Nutrition handout(s) given to patient.   Expected Outcomes Short Term Goal: Understand basic principles of dietary content, such as calories, fat, sodium, cholesterol and nutrients.      Nutrition Discharge:     Nutrition Assessments - 10/24/15 1316      MEDFICTS Scores   Pre Score 38    Post Score 21   Score Difference -17      Education Questionnaire Score:     Knowledge Questionnaire Score - 10/24/15 1313      Knowledge Questionnaire Score   Pre Score 20/24   Post Score 24/24      Goals reviewed with patient; copy given to patient.

## 2015-10-25 ENCOUNTER — Other Ambulatory Visit: Payer: Self-pay | Admitting: Cardiovascular Disease

## 2015-12-07 ENCOUNTER — Encounter: Payer: Self-pay | Admitting: *Deleted

## 2015-12-11 ENCOUNTER — Encounter: Payer: Self-pay | Admitting: Cardiovascular Disease

## 2015-12-11 ENCOUNTER — Ambulatory Visit (INDEPENDENT_AMBULATORY_CARE_PROVIDER_SITE_OTHER): Payer: Medicare Other | Admitting: Cardiovascular Disease

## 2015-12-11 VITALS — BP 150/82 | HR 77 | Ht 66.0 in | Wt 160.0 lb

## 2015-12-11 DIAGNOSIS — Z87891 Personal history of nicotine dependence: Secondary | ICD-10-CM | POA: Diagnosis not present

## 2015-12-11 DIAGNOSIS — I6523 Occlusion and stenosis of bilateral carotid arteries: Secondary | ICD-10-CM

## 2015-12-11 DIAGNOSIS — I25812 Atherosclerosis of bypass graft of coronary artery of transplanted heart without angina pectoris: Secondary | ICD-10-CM | POA: Diagnosis not present

## 2015-12-11 DIAGNOSIS — I2510 Atherosclerotic heart disease of native coronary artery without angina pectoris: Secondary | ICD-10-CM

## 2015-12-11 DIAGNOSIS — I10 Essential (primary) hypertension: Secondary | ICD-10-CM | POA: Diagnosis not present

## 2015-12-11 DIAGNOSIS — E785 Hyperlipidemia, unspecified: Secondary | ICD-10-CM

## 2015-12-11 DIAGNOSIS — Z951 Presence of aortocoronary bypass graft: Secondary | ICD-10-CM

## 2015-12-11 DIAGNOSIS — I739 Peripheral vascular disease, unspecified: Secondary | ICD-10-CM

## 2015-12-11 MED ORDER — LISINOPRIL 10 MG PO TABS: 10.0000 mg | ORAL_TABLET | Freq: Every day | ORAL | 6 refills | Status: DC

## 2015-12-11 NOTE — Progress Notes (Signed)
SUBJECTIVE: [name] presents for follow up CAD (2-v CABG).  He was hospitalized in February 2017 with a non-STEMI. Coronary angiography demonstrated critical left main obstruction and thus underwent a LIMA to the LAD and saphenous vein graft to the first obtuse marginal branch. Additional past medical history includes hypertension, hyperlipidemia, COPD, tobacco abuse, and peripheral vascular disease with a history of right external iliac artery stenting.  Echocardiogram on 05/19/15 demonstrated normal left ventricular systolic function with ischemic wall motion abnormalities, EF 50-55%, mild LVH, grade 1 diastolic dysfunction, and mild to moderate aortic regurgitation.  Denies exertional chest pain and has mild exertional dyspnea due to COPD. No leg swelling.   Review of Systems: As per "subjective", otherwise negative.  Allergies  Allergen Reactions  . Lipitor [Atorvastatin] Other (See Comments)    Leg pain (currently taking and tolerating Crestor)  . Zocor [Simvastatin] Other (See Comments)    Leg pain (currrently taking and tolerating Crestor)    Current Outpatient Prescriptions  Medication Sig Dispense Refill  . Ascorbic Acid (VITAMIN C) 1000 MG tablet Take 1,000 mg by mouth daily.    Marland Kitchen aspirin EC 325 MG EC tablet Take 1 tablet (325 mg total) by mouth daily.    . fish oil-omega-3 fatty acids 1000 MG capsule Take 1,000 g by mouth 2 (two) times daily.    Marland Kitchen gabapentin (NEURONTIN) 300 MG capsule Take 600 mg by mouth every evening.     . Garlic [id number] MG TABS Take 1,250 mg by mouth daily.     Marland Kitchen lisinopril (PRINIVIL,ZESTRIL) 5 MG tablet Take 1 tablet (5 mg total) by mouth daily. 90 tablet 3  . metoprolol tartrate (LOPRESSOR) 25 MG tablet TAKE 1 TABLET BY MOUTH TWICE A DAY 60 tablet 6  . Multiple Vitamin (MULTIVITAMIN) tablet Take 1 tablet by mouth daily.    . rosuvastatin (CRESTOR) 10 MG tablet Take 1 tablet (10 mg total) by mouth daily. 90 tablet 3  . SPIRIVA HANDIHALER 18 MCG  inhalation capsule Place 18 mcg into inhaler and inhale daily.   8  . thiamine (VITAMIN B-1) 100 MG tablet Take 100 mg by mouth daily.    . vitamin E 400 UNIT capsule Take 400 Units by mouth daily.     No current facility-administered medications for this visit.     Past Medical History:  Diagnosis Date  . Carotid artery occlusion   . Colon polyp   . COPD (chronic obstructive pulmonary disease) ([place])   . Coronary artery disease   . Hyperlipidemia   . Hypertension   . 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 Myocardial infarction ([place])   . Peripheral arterial disease [facility])     Past Surgical History:  Procedure Laterality Date  . Bilateral renal  artery stenoses  04/23/2009  . CARDIAC CATHETERIZATION N/A 05/21/2015   Procedure: Left Heart Cath and Coronary Angiography;  Surgeon: Lorretta Harp, MD;  Location: [place] CV LAB;  Service: Cardiovascular;  Laterality: N/A;  . CORONARY ARTERY BYPASS GRAFT N/A 05/22/2015   Procedure: CORONARY ARTERY BYPASS GRAFTING (CABG) LIMA-LAD and SVG-OM EVH RIGHT THIGH GREATER SAPHENOUS VEIN;  Surgeon: [name], MD;  Location: [place];  Service: Open Heart Surgery;  Laterality: N/A;  . HIATAL HERNIA REPAIR    . Percutaneous translumnial angioplasty  04/23/2009   Catheterization of Lefst external iliac artery with Left lower extremity runoff  . TEE WITHOUT CARDIOVERSION N/A 05/22/2015   Procedure: TRANSESOPHAGEAL ECHOCARDIOGRAM (TEE);  Surgeon: [name],  MD;  Location: [facility] OR;  Service: Open Heart Surgery;  Laterality: N/A;    Social History   Social History  . Marital status: Married    Spouse name: N/A  . Number of children: N/A  . Years of education: N/A   Occupational History  . Not on file.   Social History Main Topics  . Smoking status: Former Smoker    Packs/day: 2.00    Types: Cigarettes    Quit date: 11/17/1991  . Smokeless tobacco: Never Used  . Alcohol use No     Comment: used to   . Drug use: No  . Sexual activity:  Not on file   Other Topics Concern  . Not on file   Social History Narrative  . No narrative on file     Vitals:   12/11/15 1052  BP: (!) 150/82  Pulse: 77  SpO2: 94%  Weight: 160 lb (72.6 kg)  Height: 5\' 6"  (1.676 m)    PHYSICAL EXAM General: NAD HEENT: Normal. Neck: No JVD, no thyromegaly. Lungs: Clear to auscultation bilaterally with normal respiratory effort. CV: Nondisplaced PMI.  Regular rate and rhythm, normal S1/S2, no S3/S4, no murmur. No pretibial or periankle edema.    Abdomen: Soft, nontender, no distention.  Neurologic: Alert and oriented.  Psych: Normal affect. Skin: Normal. Musculoskeletal: No gross deformities.    ECG: Most recent ECG reviewed.      ASSESSMENT AND PLAN: 1. CAD s/p 2-vessel CABG with NSTEMI: Symptomatically stable. Currently taking ASA, metoprolol, ACEI, and Crestor.   2. Essential HTN: Elevated. Increase lisinopril to 10 mg.  3. Hyperlipidemia: LDL 89 on 05/19/15. Continue Crestor.  4. PVD: Follows with vascular surgery.  Dispo: f/u 6 months.   Kate Sable, M.D., F.A.C.C.

## 2015-12-11 NOTE — Patient Instructions (Signed)
Medication Instructions:   Increase Lisinopril to 10mg  daily.  Continue all other medications.    Labwork: none  Testing/Procedures: none  Follow-Up: Your physician wants you to follow up in: 6 months.  You will receive a reminder letter in the mail one-two months in advance.  If you don't receive a letter, please call our office to schedule the follow up appointment   Any Other Special Instructions Will Be Listed Below (If Applicable).  If you need a refill on your cardiac medications before your next appointment, please call your pharmacy.

## 2015-12-21 ENCOUNTER — Encounter: Payer: Self-pay | Admitting: Family

## 2015-12-24 DIAGNOSIS — J449 Chronic obstructive pulmonary disease, unspecified: Secondary | ICD-10-CM | POA: Diagnosis not present

## 2015-12-24 DIAGNOSIS — E559 Vitamin D deficiency, unspecified: Secondary | ICD-10-CM | POA: Diagnosis not present

## 2015-12-24 DIAGNOSIS — I2510 Atherosclerotic heart disease of native coronary artery without angina pectoris: Secondary | ICD-10-CM | POA: Diagnosis not present

## 2015-12-24 DIAGNOSIS — Z713 Dietary counseling and surveillance: Secondary | ICD-10-CM | POA: Diagnosis not present

## 2015-12-24 DIAGNOSIS — E119 Type 2 diabetes mellitus without complications: Secondary | ICD-10-CM | POA: Diagnosis not present

## 2015-12-24 DIAGNOSIS — E785 Hyperlipidemia, unspecified: Secondary | ICD-10-CM | POA: Diagnosis not present

## 2015-12-24 DIAGNOSIS — I10 Essential (primary) hypertension: Secondary | ICD-10-CM | POA: Diagnosis not present

## 2015-12-24 DIAGNOSIS — Z7189 Other specified counseling: Secondary | ICD-10-CM | POA: Diagnosis not present

## 2015-12-26 ENCOUNTER — Encounter: Payer: Self-pay | Admitting: Family

## 2015-12-26 ENCOUNTER — Ambulatory Visit (HOSPITAL_COMMUNITY)
Admission: RE | Admit: 2015-12-26 | Discharge: 2015-12-26 | Disposition: A | Discharge status: Home / Self Care | Payer: Medicare Other | Source: Ambulatory Visit | Attending: Family | Admitting: Family

## 2015-12-26 ENCOUNTER — Ambulatory Visit (INDEPENDENT_AMBULATORY_CARE_PROVIDER_SITE_OTHER): Payer: Medicare Other | Admitting: Family

## 2015-12-26 ENCOUNTER — Ambulatory Visit (INDEPENDENT_AMBULATORY_CARE_PROVIDER_SITE_OTHER)
Admission: RE | Admit: 2015-12-26 | Discharge: 2015-12-26 | Disposition: A | Discharge status: Home / Self Care | Payer: Medicare Other | Source: Ambulatory Visit | Attending: Family | Admitting: Family

## 2015-12-26 VITALS — BP 148/73 | HR 73 | Temp 98.5°F | Resp 16 | Ht 66.0 in | Wt 158.0 lb

## 2015-12-26 DIAGNOSIS — I739 Peripheral vascular disease, unspecified: Secondary | ICD-10-CM | POA: Insufficient documentation

## 2015-12-26 DIAGNOSIS — Z95828 Presence of other vascular implants and grafts: Secondary | ICD-10-CM | POA: Diagnosis not present

## 2015-12-26 DIAGNOSIS — I6523 Occlusion and stenosis of bilateral carotid arteries: Secondary | ICD-10-CM | POA: Insufficient documentation

## 2015-12-26 DIAGNOSIS — R0989 Other specified symptoms and signs involving the circulatory and respiratory systems: Secondary | ICD-10-CM | POA: Diagnosis present

## 2015-12-26 DIAGNOSIS — I2510 Atherosclerotic heart disease of native coronary artery without angina pectoris: Secondary | ICD-10-CM

## 2015-12-26 DIAGNOSIS — I70213 Atherosclerosis of native arteries of extremities with intermittent claudication, bilateral legs: Secondary | ICD-10-CM | POA: Diagnosis not present

## 2015-12-26 DIAGNOSIS — I771 Stricture of artery: Secondary | ICD-10-CM

## 2015-12-26 DIAGNOSIS — Z87891 Personal history of nicotine dependence: Secondary | ICD-10-CM

## 2015-12-26 NOTE — Progress Notes (Signed)
[facility] [place] HISTORY AND PHYSICAL   MRN : [id number]  History of Present Illness:   William [name] is a 75 y.o. male patient of Dr. Scot Dock who is status post right external iliac artery stent placement in January 2011.  He also has bilateral extracranial carotid artery stenosis.  He returns today for follow up.  After walking 1/10 mile, his calves claudicate, relieved by a short rest.  He has finished cardiac rehab, works out at [employer] 6 days/week. He denies non healing wounds.  He denies any history of stroke or TIA,  He reports tingling in his right 5th finger, states this does not bother him; denies any steal sx's in his left upper extremity.   Pt reports that he had a 4 vessel CABG in February 2017 after an MI. Vein in right leg and mammary artery were harvested for grafting per pt.  Pt Diabetic: No  Pt smoker: former smoker, quit in the 1990's   Pt meds include:  Statin :Yes  ASA: Yes  Other anticoagulants/antiplatelets: no    Current Outpatient Prescriptions  Medication Sig Dispense Refill  . Ascorbic Acid (VITAMIN C) 1000 MG tablet Take 1,000 mg by mouth daily.    Marland Kitchen aspirin EC 325 MG EC tablet Take 1 tablet (325 mg total) by mouth daily.    . fish oil-omega-3 fatty acids 1000 MG capsule Take 1,000 g by mouth 2 (two) times daily.    Marland Kitchen gabapentin (NEURONTIN) 300 MG capsule Take 600 mg by mouth every evening.     . Garlic [id number] MG TABS Take 1,250 mg by mouth daily.     Marland Kitchen lisinopril (PRINIVIL,ZESTRIL) 10 MG tablet Take 1 tablet (10 mg total) by mouth daily. 30 tablet 6  . metoprolol tartrate (LOPRESSOR) 25 MG tablet TAKE 1 TABLET BY MOUTH TWICE A DAY 60 tablet 6  . Multiple Vitamin (MULTIVITAMIN) tablet Take 1 tablet by mouth daily.    . rosuvastatin (CRESTOR) 10 MG tablet Take 1 tablet (10 mg total) by mouth daily. 90 tablet 3  . SPIRIVA HANDIHALER 18 MCG inhalation capsule Place 18 mcg into inhaler and inhale daily.   8  .  thiamine (VITAMIN B-1) 100 MG tablet Take 100 mg by mouth daily.    . vitamin E 400 UNIT capsule Take 400 Units by mouth daily.     No current facility-administered medications for this visit.     Past Medical History:  Diagnosis Date  . Carotid artery occlusion   . Colon polyp   . COPD (chronic obstructive pulmonary disease) ([place])   . Coronary artery disease   . Hyperlipidemia   . Hypertension   . 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 Myocardial infarction ([place])   . Peripheral arterial disease [facility])     Social History Social History  Substance Use Topics  . Smoking status: Former Smoker    Packs/day: 2.00    Types: Cigarettes    Quit date: 11/17/1991  . Smokeless tobacco: Never Used  . Alcohol use No     Comment: used to     Family History Family History  Problem Relation Age of Onset  . Heart disease Father     Heart Disease before age 13  . Pulmonary embolism Father   . Deep vein thrombosis Father   . Cancer Mother     Sarcoma    Surgical History Past Surgical History:  Procedure Laterality Date  . Bilateral renal  artery stenoses  04/23/2009  .  CARDIAC CATHETERIZATION N/A 05/21/2015   Procedure: Left Heart Cath and Coronary Angiography;  Surgeon: Lorretta Harp, MD;  Location: [place] CV [facility];  Service: Cardiovascular;  Laterality: N/A;  . CORONARY ARTERY BYPASS GRAFT N/A 05/22/2015   Procedure: CORONARY ARTERY BYPASS GRAFTING (CABG) LIMA-LAD and SVG-OM EVH RIGHT THIGH GREATER SAPHENOUS VEIN;  Surgeon: [name], MD;  Location: [place];  Service: Open Heart Surgery;  Laterality: N/A;  . HIATAL HERNIA REPAIR    . Percutaneous translumnial angioplasty  04/23/2009   Catheterization of Lefst external iliac artery with Left lower extremity runoff  . TEE WITHOUT CARDIOVERSION N/A 05/22/2015   Procedure: TRANSESOPHAGEAL ECHOCARDIOGRAM (TEE);  Surgeon: [name], MD;  Location: [place];  Service: Open Heart Surgery;  Laterality: N/A;    Allergies  Allergen  Reactions  . Lipitor [Atorvastatin] Other (See Comments)    Leg pain (currently taking and tolerating Crestor)  . Zocor [Simvastatin] Other (See Comments)    Leg pain (currrently taking and tolerating Crestor)    Current Outpatient Prescriptions  Medication Sig Dispense Refill  . Ascorbic Acid (VITAMIN C) 1000 MG tablet Take 1,000 mg by mouth daily.    Marland Kitchen aspirin EC 325 MG EC tablet Take 1 tablet (325 mg total) by mouth daily.    . fish oil-omega-3 fatty acids 1000 MG capsule Take 1,000 g by mouth 2 (two) times daily.    Marland Kitchen gabapentin (NEURONTIN) 300 MG capsule Take 600 mg by mouth every evening.     . Garlic [id number] MG TABS Take 1,250 mg by mouth daily.     Marland Kitchen lisinopril (PRINIVIL,ZESTRIL) 10 MG tablet Take 1 tablet (10 mg total) by mouth daily. 30 tablet 6  . metoprolol tartrate (LOPRESSOR) 25 MG tablet TAKE 1 TABLET BY MOUTH TWICE A DAY 60 tablet 6  . Multiple Vitamin (MULTIVITAMIN) tablet Take 1 tablet by mouth daily.    . rosuvastatin (CRESTOR) 10 MG tablet Take 1 tablet (10 mg total) by mouth daily. 90 tablet 3  . SPIRIVA HANDIHALER 18 MCG inhalation capsule Place 18 mcg into inhaler and inhale daily.   8  . thiamine (VITAMIN B-1) 100 MG tablet Take 100 mg by mouth daily.    . vitamin E 400 UNIT capsule Take 400 Units by mouth daily.     No current facility-administered medications for this visit.      REVIEW OF SYSTEMS: See HPI for pertinent positives and negatives.  Physical Examination Vitals:   12/26/15 1301 12/26/15 1304  BP: (!) 85/62 (!) 148/73  Pulse: 73   Resp: 16   Temp: 98.5 F (36.9 C)   TempSrc: Oral   SpO2: 91%   Weight: 158 lb (71.7 kg)   Height: 5\' 6"  (1.676 m)    Body mass index is 25.5 kg/m.  General: A&O x 3, WDWN. Gait: normal Eyes: PERRLA. Pulmonary: Respirations are non labored, CTAB, good air movement in all fields. Cardiac: regular rhythm and rate, no detected murmur.     Carotid Bruits Left Right   positive positive   Aorta is not palpable. Radial pulses: 3+ right, 2+ Left palpable   VASCULAR EXAM: Extremities without ischemic changes  without Gangrene; without open wounds.     LE Pulses LEFT RIGHT   FEMORAL  palpable Not palpable    POPLITEAL not palpable  not palpable   POSTERIOR TIBIAL not palpable  not palpable    DORSALIS PEDIS  ANTERIOR TIBIAL not palpable   palpable    Abdomen: soft, NT,  no palpable masses. Skin: no rashes, no ulcers. Musculoskeletal: no muscle wasting or atrophy. Neurologic: A&O X 3; Appropriate Affect ; SENSATION: normal; MOTOR FUNCTION: moving all extremities equally, motor strength 4/5 in upper extremities, 5/5 in lower extremities. Speech is fluent/normal. CN 2-12 intact    ASSESSMENT:  William [name] is a 75 y.o. male who is status post right external iliac artery stent placement in January 2011. He has mild claudication in both calves with walking, no rest pain, no tissue loss. Pt was having claudication pain in both lower legs after walking about 50 feet before his CABG in February 2017. He states he feels better than he has in a long time. He also has bilateral carotid artery stenosis with no history of stroke or TIA.  DATA Today's carotid duplex suggests >50% bilateral ECA stenosis Retrograde left vertebral artery and monophasic left proximal subclavian artery flow patterns noted with a greater than 20 mm Hg difference in brachial pressures. 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 40-59% (high end of range) right ICA stenosis,  and  <40% (high end of range) left ICA stenosis. Right ICA comparison not valid due to calcified plaque. No significant change in left ICA stenosis compared  to exam of 06/25/15.   ABI's remain stable with severely decreased arterial perfusion in right leg, moderately decreased in left leg, all monophasic waveforms, right PT not recorded.    PLAN:   Graduated walking program discussed and how to achieve.   Based on today's exam and non-invasive vascular lab results, the patient will follow up in 6 months with the following tests: carotid duplex and ABI's. I discussed in depth with the patient the nature of atherosclerosis, and emphasized the importance of maximal medical management including strict control of blood pressure, blood glucose, and lipid levels, obtaining regular exercise, and cessation of smoking.  The patient is aware that without maximal medical management the underlying atherosclerotic disease process will progress, limiting the benefit of any interventions.  The patient was given information about stroke prevention and what symptoms should prompt the patient to seek immediate medical care.  The patient was given information about PAD including signs, symptoms, treatment, what symptoms should prompt the patient to seek immediate medical care, and risk reduction measures to take. Thank you for allowing [place] to participate in this patient's care.  Clemon Chambers, RN, MSN, FNP-C [facility] Office: [phone]  Clinic MD: Scot Dock 12/26/2015 1:09 PM

## 2015-12-26 NOTE — Patient Instructions (Signed)
Stroke Prevention Some medical conditions and behaviors are associated with an increased chance of having a stroke. You may prevent a stroke by making healthy choices and managing medical conditions. HOW CAN I REDUCE MY RISK OF HAVING A STROKE?   Stay physically active. Get at least 30 minutes of activity on most or all days.  Do not smoke. It may also be helpful to avoid exposure to secondhand smoke.  Limit alcohol use. Moderate alcohol use is considered to be:  No more than 2 drinks per day for men.  No more than 1 drink per day for nonpregnant women.  Eat healthy foods. This involves:  Eating 5 or more servings of fruits and vegetables a day.  Making dietary changes that address high blood pressure (hypertension), high cholesterol, diabetes, or obesity.  Manage your cholesterol levels.  Making food choices that are high in fiber and low in saturated fat, trans fat, and cholesterol may control cholesterol levels.  Take any prescribed medicines to control cholesterol as directed by your health care provider.  Manage your diabetes.  Controlling your carbohydrate and sugar intake is recommended to manage diabetes.  Take any prescribed medicines to control diabetes as directed by your health care provider.  Control your hypertension.  Making food choices that are low in salt (sodium), saturated fat, trans fat, and cholesterol is recommended to manage hypertension.  Ask your health care provider if you need treatment to lower your blood pressure. Take any prescribed medicines to control hypertension as directed by your health care provider.  If you are 18-39 years of age, have your blood pressure checked every 3-5 years. If you are 40 years of age or older, have your blood pressure checked every year.  Maintain a healthy weight.  Reducing calorie intake and making food choices that are low in sodium, saturated fat, trans fat, and cholesterol are recommended to manage  weight.  Stop drug abuse.  Avoid taking birth control pills.  Talk to your health care provider about the risks of taking birth control pills if you are over 35 years old, smoke, get migraines, or have ever had a blood clot.  Get evaluated for sleep disorders (sleep apnea).  Talk to your health care provider about getting a sleep evaluation if you snore a lot or have excessive sleepiness.  Take medicines only as directed by your health care provider.  For some people, aspirin or blood thinners (anticoagulants) are helpful in reducing the risk of forming abnormal blood clots that can lead to stroke. If you have the irregular heart rhythm of atrial fibrillation, you should be on a blood thinner unless there is a good reason you cannot take them.  Understand all your medicine instructions.  Make sure that other conditions (such as anemia or atherosclerosis) are addressed. SEEK IMMEDIATE MEDICAL CARE IF:   You have sudden weakness or numbness of the face, arm, or leg, especially on one side of the body.  Your face or eyelid droops to one side.  You have sudden confusion.  You have trouble speaking (aphasia) or understanding.  You have sudden trouble seeing in one or both eyes.  You have sudden trouble walking.  You have dizziness.  You have a loss of balance or coordination.  You have a sudden, severe headache with no known cause.  You have new chest pain or an irregular heartbeat. Any of these symptoms may represent a serious problem that is an emergency. Do not wait to see if the symptoms will   go away. Get medical help at once. Call your local emergency services (911 in [place] Do not drive yourself to the hospital.   This information is not intended to replace advice given to you by your health care provider. Make sure you discuss any questions you have with your health care provider.   Document Released: 04/24/2004 Document Revised: 04/07/2014 Document Reviewed:  09/17/2012 Elsevier Interactive Patient Education 2016 [employer] Inc[employer]    Peripheral Vascular Disease Peripheral vascular disease (PVD) is a disease of the blood vessels that are not part of your heart and brain. A simple term for PVD is poor circulation. In most cases, PVD narrows the blood vessels that carry blood from your heart to the rest of your body. This can result in a decreased supply of blood to your arms, legs, and internal organs, like your stomach or kidneys. However, it most often affects a person's lower legs and feet. There are two types of PVD.  Organic PVD. This is the more common type. It is caused by damage to the structure of blood vessels.  Functional PVD. This is caused by conditions that make blood vessels contract and tighten (spasm). Without treatment, PVD tends to get worse over time. PVD can also lead to acute ischemic limb. This is when an arm or limb suddenly has trouble getting enough blood. This is a medical emergency. CAUSES Each type of PVD has many different causes. The most common cause of PVD is buildup of a fatty material (plaque) inside of your arteries (atherosclerosis). Small amounts of plaque can break off from the walls of the blood vessels and become lodged in a smaller artery. This blocks blood flow and can cause acute ischemic limb. Other common causes of PVD include:  Blood clots that form inside of blood vessels.  Injuries to blood vessels.  Diseases that cause inflammation of blood vessels or cause blood vessel spasms.  Health behaviors and health history that increase your risk of developing PVD. RISK FACTORS  You may have a greater risk of PVD if you:  Have a family history of PVD.  Have certain medical conditions, including:  High cholesterol.  Diabetes.  High blood pressure (hypertension).  Coronary heart disease.  Past problems with blood clots.  Past injury, such as burns or a broken bone. These may have damaged blood  vessels in your limbs.  Buerger disease. This is caused by inflamed blood vessels in your hands and feet.  Some forms of arthritis.  Rare birth defects that affect the arteries in your legs.  Use tobacco.  Do not get enough exercise.  Are obese.  Are age 50 or older. SIGNS AND SYMPTOMS  PVD may cause many different symptoms. Your symptoms depend on what part of your body is not getting enough blood. Some common signs and symptoms include:  Cramps in your lower legs. This may be a symptom of poor leg circulation (claudication).  Pain and weakness in your legs while you are physically active that goes away when you rest (intermittent claudication).  Leg pain when at rest.  Leg numbness, tingling, or weakness.  Coldness in a leg or foot, especially when compared with the other leg.  Skin or hair changes. These can include:  Hair loss.  Shiny skin.  Pale or bluish skin.  Thick toenails.  Inability to get or maintain an erection (erectile dysfunction). People with PVD are more prone to developing ulcers and sores on their toes, feet, or legs. These may take longer than   normal to heal. DIAGNOSIS Your health care provider may diagnose PVD from your signs and symptoms. The health care provider will also do a physical exam. You may have tests to find out what is causing your PVD and determine its severity. Tests may include:  Blood pressure recordings from your arms and legs and measurements of the strength of your pulses (pulse volume recordings).  Imaging studies using sound waves to take pictures of the blood flow through your blood vessels (Doppler ultrasound).  Injecting a dye into your blood vessels before having imaging studies using:  X-rays (angiogram or arteriogram).  Computer-generated X-rays (CT angiogram).  A powerful electromagnetic field and a computer (magnetic resonance angiogram or MRA). TREATMENT Treatment for PVD depends on the cause of your condition  and the severity of your symptoms. It also depends on your age. Underlying causes need to be treated and controlled. These include long-lasting (chronic) conditions, such as diabetes, high cholesterol, and high blood pressure. You may need to first try making lifestyle changes and taking medicines. Surgery may be needed if these do not work. Lifestyle changes may include:  Quitting smoking.  Exercising regularly.  Following a low-fat, low-cholesterol diet. Medicines may include:  Blood thinners to prevent blood clots.  Medicines to improve blood flow.  Medicines to improve your blood cholesterol levels. Surgical procedures may include:  A procedure that uses an inflated balloon to open a blocked artery and improve blood flow (angioplasty).  A procedure to put in a tube (stent) to keep a blocked artery open (stent implant).  Surgery to reroute blood flow around a blocked artery (peripheral bypass surgery).  Surgery to remove dead tissue from an infected wound on the affected limb.  Amputation. This is surgical removal of the affected limb. This may be necessary in cases of acute ischemic limb that are not improved through medical or surgical treatments. HOME CARE INSTRUCTIONS  Take medicines only as directed by your health care provider.  Do not use any tobacco products, including cigarettes, chewing tobacco, or electronic cigarettes. If you need help quitting, ask your health care provider.  Lose weight if you are overweight, and maintain a healthy weight as directed by your health care provider.  Eat a diet that is low in fat and cholesterol. If you need help, ask your health care provider.  Exercise regularly. Ask your health care provider to suggest some good activities for you.  Use compression stockings or other mechanical devices as directed by your health care provider.  Take good care of your feet.  Wear comfortable shoes that fit well.  Check your feet often for  any cuts or sores. SEEK MEDICAL CARE IF:  You have cramps in your legs while walking.  You have leg pain when you are at rest.  You have coldness in a leg or foot.  Your skin changes.  You have erectile dysfunction.  You have cuts or sores on your feet that are not healing. SEEK IMMEDIATE MEDICAL CARE IF:  Your arm or leg turns cold and blue.  Your arms or legs become red, warm, swollen, painful, or numb.  You have chest pain or trouble breathing.  You suddenly have weakness in your face, arm, or leg.  You become very confused or lose the ability to speak.  You suddenly have a very bad headache or lose your vision.   This information is not intended to replace advice given to you by your health care provider. Make sure you discuss any questions   you have with your health care provider.   Document Released: 04/24/2004 Document Revised: 04/07/2014 Document Reviewed: 08/25/2013 Elsevier Interactive Patient Education 2016 [employer]vier Inc.  

## 2016-01-01 NOTE — Addendum Note (Signed)
Addended by: Kaleen Mask on: 01/01/2016 11:35 AM   Modules accepted: Orders

## 2016-01-14 ENCOUNTER — Other Ambulatory Visit: Payer: Self-pay | Admitting: *Deleted

## 2016-01-14 MED ORDER — LISINOPRIL 10 MG PO TABS: 10.0000 mg | ORAL_TABLET | Freq: Every day | ORAL | 3 refills | Status: DC

## 2016-01-14 MED ORDER — METOPROLOL TARTRATE 25 MG PO TABS: 25.0000 mg | ORAL_TABLET | Freq: Two times a day (BID) | ORAL | 3 refills | Status: DC

## 2016-01-23 DIAGNOSIS — H11041 Peripheral pterygium, stationary, right eye: Secondary | ICD-10-CM | POA: Diagnosis not present

## 2016-01-23 DIAGNOSIS — Z23 Encounter for immunization: Secondary | ICD-10-CM | POA: Diagnosis not present

## 2016-01-23 DIAGNOSIS — H26491 Other secondary cataract, right eye: Secondary | ICD-10-CM | POA: Diagnosis not present

## 2016-04-15 DIAGNOSIS — I10 Essential (primary) hypertension: Secondary | ICD-10-CM | POA: Diagnosis not present

## 2016-04-15 DIAGNOSIS — E559 Vitamin D deficiency, unspecified: Secondary | ICD-10-CM | POA: Diagnosis not present

## 2016-04-15 DIAGNOSIS — E785 Hyperlipidemia, unspecified: Secondary | ICD-10-CM | POA: Diagnosis not present

## 2016-04-15 DIAGNOSIS — E119 Type 2 diabetes mellitus without complications: Secondary | ICD-10-CM | POA: Diagnosis not present

## 2016-05-25 ENCOUNTER — Other Ambulatory Visit: Payer: Self-pay | Admitting: Cardiovascular Disease

## 2016-06-18 ENCOUNTER — Encounter: Payer: Self-pay | Admitting: *Deleted

## 2016-06-18 ENCOUNTER — Ambulatory Visit (INDEPENDENT_AMBULATORY_CARE_PROVIDER_SITE_OTHER): Payer: Medicare Other | Admitting: Cardiovascular Disease

## 2016-06-18 ENCOUNTER — Encounter: Payer: Self-pay | Admitting: Cardiovascular Disease

## 2016-06-18 VITALS — BP 175/76 | HR 69 | Ht 65.0 in | Wt 159.0 lb

## 2016-06-18 DIAGNOSIS — I739 Peripheral vascular disease, unspecified: Secondary | ICD-10-CM

## 2016-06-18 DIAGNOSIS — I10 Essential (primary) hypertension: Secondary | ICD-10-CM | POA: Diagnosis not present

## 2016-06-18 DIAGNOSIS — E7800 Pure hypercholesterolemia, unspecified: Secondary | ICD-10-CM | POA: Diagnosis not present

## 2016-06-18 DIAGNOSIS — Z951 Presence of aortocoronary bypass graft: Secondary | ICD-10-CM

## 2016-06-18 DIAGNOSIS — I209 Angina pectoris, unspecified: Secondary | ICD-10-CM | POA: Diagnosis not present

## 2016-06-18 DIAGNOSIS — I25708 Atherosclerosis of coronary artery bypass graft(s), unspecified, with other forms of angina pectoris: Secondary | ICD-10-CM

## 2016-06-18 MED ORDER — LISINOPRIL 20 MG PO TABS: 20.0000 mg | ORAL_TABLET | Freq: Every day | ORAL | 3 refills | Status: DC

## 2016-06-18 MED ORDER — ASPIRIN EC 81 MG PO TBEC: 81.0000 mg | DELAYED_RELEASE_TABLET | Freq: Every day | ORAL | Status: DC

## 2016-06-18 NOTE — Patient Instructions (Signed)
Medication Instructions:   Decrease Aspirin to 81mg  daily.  Increase Lisinopril to 20mg  daily.  Continue all other medications.    Labwork: none  Testing/Procedures: none  Follow-Up: Your physician wants you to follow up in: 6 months.  You will receive a reminder letter in the mail one-two months in advance.  If you don't receive a letter, please call our office to schedule the follow up appointment   Any Other Special Instructions Will Be Listed Below (If Applicable).  If you need a refill on your cardiac medications before your next appointment, please call your pharmacy.

## 2016-06-18 NOTE — Progress Notes (Signed)
SUBJECTIVE: William [name] presents for follow up of CAD (2-v CABG).  He was hospitalized in February 2017 with a non-STEMI. Coronary angiography demonstrated critical left main obstruction and thus underwent a LIMA to the LAD and saphenous vein graft to the first obtuse marginal branch. Additional past medical history includes hypertension, hyperlipidemia, COPD, tobacco abuse, and peripheral vascular disease with a history of right external iliac artery stenting.  Echocardiogram on 05/19/15 demonstrated normal left ventricular systolic function with ischemic wall motion abnormalities, EF 50-55%, mild LVH, grade 1 diastolic dysfunction, and mild to moderate aortic regurgitation.  Denies exertional chest pain and has mild exertional dyspnea due to COPD. Denies leg swelling.  ECG performed in the office today demonstrates normal sinus rhythm with a diffuse nonspecific ST segment abnormality.  He has been exercising at the [facility] 6 days per week. He uses the rowing machine and lifts weights. He checks his blood pressure at home and it has been as high as 180/80 in the morning.   Review of Systems: As per "subjective", otherwise negative.  Allergies  Allergen Reactions  . Lipitor [Atorvastatin] Other (See Comments)    Leg pain (currently taking and tolerating Crestor)  . Zocor [Simvastatin] Other (See Comments)    Leg pain (currrently taking and tolerating Crestor)    Current Outpatient Prescriptions  Medication Sig Dispense Refill  . Ascorbic Acid (VITAMIN C) 1000 MG tablet Take 1,000 mg by mouth daily.    Marland Kitchen aspirin EC 325 MG EC tablet Take 1 tablet (325 mg total) by mouth daily.    . fish oil-omega-3 fatty acids 1000 MG capsule Take 1,000 g by mouth daily.     . folic acid (FOLVITE) 161 MCG tablet Take 400 mcg by mouth daily.    Marland Kitchen gabapentin (NEURONTIN) 300 MG capsule Take 600 mg by mouth every evening.     . Garlic 0960 MG TABS Take 1,250 mg by mouth daily.     Marland Kitchen lisinopril  (PRINIVIL,ZESTRIL) 10 MG tablet Take 1 tablet (10 mg total) by mouth daily. 90 tablet 3  . metoprolol tartrate (LOPRESSOR) 25 MG tablet Take 1 tablet (25 mg total) by mouth 2 (two) times daily. 180 tablet 3  . Multiple Vitamin (MULTIVITAMIN) tablet Take 1 tablet by mouth daily.    . rosuvastatin (CRESTOR) 10 MG tablet TAKE 1 TABLET (10 MG TOTAL) BY MOUTH DAILY. 90 tablet 3  . SPIRIVA HANDIHALER 18 MCG inhalation capsule Place 18 mcg into inhaler and inhale daily.   8  . thiamine (VITAMIN B-1) 100 MG tablet Take 100 mg by mouth daily.    . vitamin E 400 UNIT capsule Take 400 Units by mouth daily.     No current facility-administered medications for this visit.     Past Medical History:  Diagnosis Date  . Carotid artery occlusion   . Colon polyp   . COPD (chronic obstructive pulmonary disease) ([place])   . Coronary artery disease   . Hyperlipidemia   . Hypertension   . 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 Myocardial infarction   . Peripheral arterial disease [facility])     Past Surgical History:  Procedure Laterality Date  . Bilateral renal  artery stenoses  04/23/2009  . CARDIAC CATHETERIZATION N/A 05/21/2015   Procedure: Left Heart Cath and Coronary Angiography;  Surgeon: Lorretta Harp, MD;  Location: [place] [facility];  Service: Cardiovascular;  Laterality: N/A;  . CORONARY ARTERY BYPASS GRAFT N/A 05/22/2015   Procedure: CORONARY ARTERY BYPASS  GRAFTING (CABG) LIMA-LAD and SVG-OM EVH RIGHT THIGH GREATER SAPHENOUS VEIN;  Surgeon: [name], MD;  Location: [place];  Service: Open Heart Surgery;  Laterality: N/A;  . HIATAL HERNIA REPAIR    . Percutaneous translumnial angioplasty  04/23/2009   Catheterization of [place]st external iliac artery with Left lower extremity runoff  . TEE WITHOUT CARDIOVERSION N/A 05/22/2015   Procedure: TRANSESOPHAGEAL ECHOCARDIOGRAM (TEE);  Surgeon: [name], MD;  Location: [place];  Service: Open Heart Surgery;  Laterality: N/A;    Social History   Social  History  . Marital status: Married    Spouse name: N/A  . Number of children: N/A  . Years of education: N/A   Occupational History  . Not on file.   Social History Main Topics  . Smoking status: Former Smoker    Packs/day: 2.00    Years: 30.00    Types: Cigarettes    Start date: 06/03/1961    Quit date: 11/17/1991  . Smokeless tobacco: Never Used  . Alcohol use No     Comment: used to   . Drug use: No  . Sexual activity: Not on file   Other Topics Concern  . Not on file   Social History Narrative  . No narrative on file     Vitals:   06/18/16 1124  BP: (!) 175/76  Pulse: 69  Weight: 159 lb (72.1 kg)  Height: 5\' 5"  (1.651 m)    PHYSICAL EXAM General: NAD HEENT: Normal. Neck: No JVD, no thyromegaly. Lungs: Diminished throughout, no rales or wheezes. CV: Nondisplaced PMI.  Regular rate and rhythm, normal S1/S2, no S3/S4, no murmur. No pretibial or periankle edema.  No carotid bruit.   Abdomen: Soft, nontender, no distention.  Neurologic: Alert and oriented.  Psych: Normal affect. Skin: Normal. Musculoskeletal: No gross deformities.    ECG: Most recent ECG reviewed.      ASSESSMENT AND PLAN: 1. CAD s/p 2-vessel CABG with NSTEMI: Symptomatically stable. Currently taking ASA (reduce to 81 mg), metoprolol, ACEI (increase to 20 mg), and Crestor.   2. Essential HTN: Elevated. I will increase lisinopril to 20 mg.  3. Hyperlipidemia: Continue Crestor. Will obtain copy of lipids from PCP.  4. PVD: Follows with vascular surgery.  Dispo: f/u 6 months.   Kate Sable, M.D., F.A.C.C.

## 2016-06-20 ENCOUNTER — Encounter: Payer: Self-pay | Admitting: Family

## 2016-07-02 ENCOUNTER — Ambulatory Visit (INDEPENDENT_AMBULATORY_CARE_PROVIDER_SITE_OTHER): Payer: Medicare Other | Admitting: Family

## 2016-07-02 ENCOUNTER — Ambulatory Visit (HOSPITAL_COMMUNITY)
Admission: RE | Admit: 2016-07-02 | Discharge: 2016-07-02 | Disposition: A | Discharge status: Home / Self Care | Payer: Medicare Other | Source: Ambulatory Visit | Attending: Family | Admitting: Family

## 2016-07-02 ENCOUNTER — Encounter: Payer: Self-pay | Admitting: Family

## 2016-07-02 VITALS — BP 153/76 | HR 89 | Temp 97.0°F | Resp 20 | Ht 66.0 in | Wt 158.0 lb

## 2016-07-02 DIAGNOSIS — I771 Stricture of artery: Secondary | ICD-10-CM

## 2016-07-02 DIAGNOSIS — I6523 Occlusion and stenosis of bilateral carotid arteries: Secondary | ICD-10-CM

## 2016-07-02 DIAGNOSIS — I739 Peripheral vascular disease, unspecified: Secondary | ICD-10-CM | POA: Insufficient documentation

## 2016-07-02 DIAGNOSIS — I70213 Atherosclerosis of native arteries of extremities with intermittent claudication, bilateral legs: Secondary | ICD-10-CM

## 2016-07-02 DIAGNOSIS — Z87891 Personal history of nicotine dependence: Secondary | ICD-10-CM

## 2016-07-02 DIAGNOSIS — Z95828 Presence of other vascular implants and grafts: Secondary | ICD-10-CM

## 2016-07-02 LAB — VAS US CAROTID
LEFT ECA DIAS: -11 cm/s
LEFT VERTEBRAL DIAS: 7 cm/s
Left CCA dist dias: -26 cm/s
Left CCA dist sys: -134 cm/s
Left CCA prox dias: 22 cm/s
Left CCA prox sys: 101 cm/s
Left ICA dist dias: -27 cm/s
Left ICA dist sys: -91 cm/s
Left ICA prox dias: 12 cm/s
Left ICA prox sys: 76 cm/s
RIGHT CCA MID DIAS: 14 cm/s
RIGHT ECA DIAS: -9 cm/s
RIGHT VERTEBRAL DIAS: -19 cm/s
Right CCA prox dias: 7 cm/s
Right CCA prox sys: 126 cm/s

## 2016-07-02 NOTE — Patient Instructions (Signed)
Stroke Prevention Some medical conditions and behaviors are associated with an increased chance of having a stroke. You may prevent a stroke by making healthy choices and managing medical conditions. How can I reduce my risk of having a stroke?  Stay physically active. Get at least 30 minutes of activity on most or all days.  Do not smoke. It may also be helpful to avoid exposure to secondhand smoke.  Limit alcohol use. Moderate alcohol use is considered to be:  No more than 2 drinks per day for men.  No more than 1 drink per day for nonpregnant women.  Eat healthy foods. This involves:  Eating 5 or more servings of fruits and vegetables a day.  Making dietary changes that address high blood pressure (hypertension), high cholesterol, diabetes, or obesity.  Manage your cholesterol levels.  Making food choices that are high in fiber and low in saturated fat, trans fat, and cholesterol may control cholesterol levels.  Take any prescribed medicines to control cholesterol as directed by your health care provider.  Manage your diabetes.  Controlling your carbohydrate and sugar intake is recommended to manage diabetes.  Take any prescribed medicines to control diabetes as directed by your health care provider.  Control your hypertension.  Making food choices that are low in salt (sodium), saturated fat, trans fat, and cholesterol is recommended to manage hypertension.  Ask your health care provider if you need treatment to lower your blood pressure. Take any prescribed medicines to control hypertension as directed by your health care provider.  If you are 18-39 years of age, have your blood pressure checked every 3-5 years. If you are 40 years of age or older, have your blood pressure checked every year.  Maintain a healthy weight.  Reducing calorie intake and making food choices that are low in sodium, saturated fat, trans fat, and cholesterol are recommended to manage  weight.  Stop drug abuse.  Avoid taking birth control pills.  Talk to your health care provider about the risks of taking birth control pills if you are over 35 years old, smoke, get migraines, or have ever had a blood clot.  Get evaluated for sleep disorders (sleep apnea).  Talk to your health care provider about getting a sleep evaluation if you snore a lot or have excessive sleepiness.  Take medicines only as directed by your health care provider.  For some people, aspirin or blood thinners (anticoagulants) are helpful in reducing the risk of forming abnormal blood clots that can lead to stroke. If you have the irregular heart rhythm of atrial fibrillation, you should be on a blood thinner unless there is a good reason you cannot take them.  Understand all your medicine instructions.  Make sure that other conditions (such as anemia or atherosclerosis) are addressed. Get help right away if:  You have sudden weakness or numbness of the face, arm, or leg, especially on one side of the body.  Your face or eyelid droops to one side.  You have sudden confusion.  You have trouble speaking (aphasia) or understanding.  You have sudden trouble seeing in one or both eyes.  You have sudden trouble walking.  You have dizziness.  You have a loss of balance or coordination.  You have a sudden, severe headache with no known cause.  You have new chest pain or an irregular heartbeat. Any of these symptoms may represent a serious problem that is an emergency. Do not wait to see if the symptoms will go away.   Get medical help at once. Call your local emergency services (911 in [place] Do not drive yourself to the hospital. This information is not intended to replace advice given to you by your health care provider. Make sure you discuss any questions you have with your health care provider. Document Released: 04/24/2004 Document Revised: 08/23/2015 Document Reviewed: 09/17/2012 Elsevier  Interactive Patient Education  2017 Elsevier Inc.     Preventing Cerebrovascular Disease Arteries are blood vessels that carry blood that contains oxygen from the heart to all parts of the body. Cerebrovascular disease affects arteries that supply the brain. Any condition that blocks or disrupts blood flow to the brain can cause cerebrovascular disease. Brain cells that lose blood supply start to die within minutes (stroke). Stroke is the main danger of cerebrovascular disease. Atherosclerosis and high blood pressure are common causes of cerebrovascular disease. Atherosclerosis is narrowing and hardening of an artery that results when fat, cholesterol, calcium, or other substances (plaque) build up inside an artery. Plaque reduces blood flow through the artery. High blood pressure increases the risk of bleeding inside the brain. Making diet and lifestyle changes to prevent atherosclerosis and high blood pressure lowers your risk of cerebrovascular disease. What nutrition changes can be made?  Eat more fruits, vegetables, and whole grains.  Reduce how much saturated fat you eat. To do this, eat less red meat and fewer full-fat dairy products.  Eat healthy proteins instead of red meat. Healthy proteins include:  Fish. Eat fish that contains heart-healthy omega-3 fatty acids, twice a week. Examples include salmon, albacore tuna, mackerel, and herring.  Chicken.  Nuts.  Low-fat or nonfat yogurt.  Avoid processed meats, like bacon and lunchmeat.  Avoid foods that contain:  A lot of sugar, such as sweets and drinks with added sugar.  A lot of salt (sodium). Avoid adding extra salt to your food, as told by your health care provider.  Trans fats, such as margarine and baked goods. Trans fats may be listed as "partially hydrogenated oils" on food labels.  Check food labels to see how much sodium, sugar, and trans fats are in foods.  Use vegetable oils that contain low amounts of  saturated fat, such as olive oil or canola oil. What lifestyle changes can be made?  Drink alcohol in moderation. This means no more than 1 drink a day for nonpregnant women and 2 drinks a day for men. One drink equals 12 oz of beer, 5 oz of wine, or 1 oz of hard liquor.  If you are overweight, ask your health care provider to recommend a weight-loss plan for you. Losing 5-10 lb (2.2-4.5 kg) can reduce your risk of diabetes, atherosclerosis, and high blood pressure.  Exercise for 30?60 minutes on most days, or as much as told by your health care provider.  Do moderate-intensity exercise, such as brisk walking, bicycling, and water aerobics. Ask your health care provider which activities are safe for you.  Do not use any products that contain nicotine or tobacco, such as cigarettes and e-cigarettes. If you need help quitting, ask your health care provider. Why are these changes important? Making these changes lowers your risk of many diseases that can cause cerebrovascular disease and stroke. Stroke is a leading cause of death and disability. Making these changes also improves your overall health and quality of life. What can I do to lower my risk? The following factors make you more likely to develop cerebrovascular disease:  Being overweight.  Smoking.  Being physically inactive.    Eating a high-fat diet.  Having certain health conditions, such as:  Diabetes.  High blood pressure.  Heart disease.  Atherosclerosis.  High cholesterol.  Sickle cell disease. Talk with your health care provider about your risk for cerebrovascular disease. Work with your health care provider to control diseases that you have that may contribute to cerebrovascular disease. Your health care provider may prescribe medicines to help prevent major causes of cerebrovascular disease. Where to find more information: Learn more about preventing cerebrovascular disease from:  [place], Lung, and  [place]: MoAnalyst.de  Centers for Disease Control and Prevention: http://www.curry-wood.biz/ Summary  Cerebrovascular disease can lead to a stroke.  Atherosclerosis and high blood pressure are major causes of cerebrovascular disease.  Making diet and lifestyle changes can reduce your risk of cerebrovascular disease.  Work with your health care provider to get your risk factors under control to reduce your risk of cerebrovascular disease. This information is not intended to replace advice given to you by your health care provider. Make sure you discuss any questions you have with your health care provider. Document Released: 04/01/2015 Document Revised: 10/05/2015 Document Reviewed: 04/01/2015 Elsevier Interactive Patient Education  2017 [place].     Peripheral Vascular Disease Peripheral vascular disease (PVD) is a disease of the blood vessels that are not part of your heart and brain. A simple term for PVD is poor circulation. In most cases, PVD narrows the blood vessels that carry blood from your heart to the rest of your body. This can result in a decreased supply of blood to your arms, legs, and internal organs, like your stomach or kidneys. However, it most often affects a person's lower legs and feet. There are two types of PVD.  Organic PVD. This is the more common type. It is caused by damage to the structure of blood vessels.  Functional PVD. This is caused by conditions that make blood vessels contract and tighten (spasm). Without treatment, PVD tends to get worse over time. PVD can also lead to acute ischemic limb. This is when an arm or limb suddenly has trouble getting enough blood. This is a medical emergency. Follow these instructions at home:  Take medicines only as told by your doctor.  Do not use any tobacco products, including cigarettes, chewing tobacco, or electronic cigarettes. If you need help quitting, ask  your doctor.  Lose weight if you are overweight, and maintain a healthy weight as told by your doctor.  Eat a diet that is low in fat and cholesterol. If you need help, ask your doctor.  Exercise regularly. Ask your doctor for some good activities for you.  Take good care of your feet.  Wear comfortable shoes that fit well.  Check your feet often for any cuts or sores. Contact a doctor if:  You have cramps in your legs while walking.  You have leg pain when you are at rest.  You have coldness in a leg or foot.  Your skin changes.  You are unable to get or have an erection (erectile dysfunction).  You have cuts or sores on your feet that are not healing. Get help right away if:  Your arm or leg turns cold and blue.  Your arms or legs become red, warm, swollen, painful, or numb.  You have chest pain or trouble breathing.  You suddenly have weakness in your face, arm, or leg.  You become very confused or you cannot speak.  You suddenly have a very bad headache.  You suddenly cannot see. This information is not intended to replace advice given to you by your health care provider. Make sure you discuss any questions you have with your health care provider. Document Released: 06/11/2009 Document Revised: 08/23/2015 Document Reviewed: 08/25/2013 Elsevier Interactive Patient Education  2017 Elsevier Inc.  

## 2016-07-02 NOTE — Progress Notes (Signed)
[facility] [place] HISTORY AND PHYSICAL   MRN : [id number]  History of Present Illness:   William [name] is a 76 y.o. male patient of Dr. Scot Dock who is status post right external iliac artery stent placement in January 2011.  He also has bilateral extracranial carotid artery stenosis.  He returns today for follow up.  After walking 100 yards his right calf claudicates, relieved by a short rest; he states this has improved [facility]   He has finished cardiac rehab. He denies non healing wounds.  He denies any history of stroke or TIA.  Pt reports that he had a 4 vessel CABG in February 2017 after an MI. Vein in right leg and mammary artery were harvested for grafting per pt.  He denies any tingling, numbness, cold sensation, pain, or weakness in either upper extremity.   Pt Diabetic: No Pt smoker: former smoker, quit in the 1990's  Pt meds include:  Statin :Yes ASA: Yes Other anticoagulants/antiplatelets: no    Current Outpatient Prescriptions  Medication Sig Dispense Refill  . Ascorbic Acid (VITAMIN C) 1000 MG tablet Take 1,000 mg by mouth daily.    Marland Kitchen aspirin EC 81 MG tablet Take 1 tablet (81 mg total) by mouth daily.    . fish oil-omega-3 fatty acids 1000 MG capsule Take 1,000 g by mouth daily.     . folic acid (FOLVITE) 025 MCG tablet Take 400 mcg by mouth daily.    Marland Kitchen gabapentin (NEURONTIN) 300 MG capsule Take 600 mg by mouth every evening.     . Garlic 4270 MG TABS Take 1,250 mg by mouth daily.     Marland Kitchen lisinopril (PRINIVIL,ZESTRIL) 20 MG tablet Take 1 tablet (20 mg total) by mouth daily. 90 tablet 3  . metoprolol tartrate (LOPRESSOR) 25 MG tablet Take 1 tablet (25 mg total) by mouth 2 (two) times daily. 180 tablet 3  . Multiple Vitamin (MULTIVITAMIN) tablet Take 1 tablet by mouth daily.    . rosuvastatin (CRESTOR) 10 MG tablet TAKE 1 TABLET (10 MG TOTAL) BY MOUTH DAILY. 90 tablet 3  . SPIRIVA  HANDIHALER 18 MCG inhalation capsule Place 18 mcg into inhaler and inhale daily.   8  . thiamine (VITAMIN B-1) 100 MG tablet Take 100 mg by mouth daily.    . vitamin E 400 UNIT capsule Take 400 Units by mouth daily.     No current facility-administered medications for this visit.     Past Medical History:  Diagnosis Date  . Carotid artery occlusion   . Colon polyp   . COPD (chronic obstructive pulmonary disease) ([place])   . Coronary artery disease   . Hyperlipidemia   . Hypertension   . 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 Myocardial infarction   . Peripheral arterial disease [facility])     Social History Social History  Substance Use Topics  . Smoking status: Former Smoker    Packs/day: 2.00    Years: 30.00    Types: Cigarettes    Start date: 06/03/1961    Quit date: 11/17/1991  . Smokeless tobacco: Never Used  . Alcohol use No     Comment: used to     Family History Family History  Problem Relation Age of Onset  . Heart disease Father     Heart Disease before age 66  . Pulmonary embolism Father   . Deep vein thrombosis Father   . Cancer Mother  Sarcoma    Surgical History Past Surgical History:  Procedure Laterality Date  . Bilateral renal  artery stenoses  04/23/2009  . CARDIAC CATHETERIZATION N/A 05/21/2015   Procedure: Left Heart Cath and Coronary Angiography;  Surgeon: Lorretta Harp, MD;  Location: [place] [facility];  Service: Cardiovascular;  Laterality: N/A;  . CORONARY ARTERY BYPASS GRAFT N/A 05/22/2015   Procedure: CORONARY ARTERY BYPASS GRAFTING (CABG) LIMA-LAD and SVG-OM EVH RIGHT THIGH GREATER SAPHENOUS VEIN;  Surgeon: [name], MD;  Location: [place];  Service: Open Heart Surgery;  Laterality: N/A;  . HIATAL HERNIA REPAIR    . Percutaneous translumnial angioplasty  04/23/2009   Catheterization of Lefst external iliac artery with Left lower extremity runoff  . TEE WITHOUT CARDIOVERSION N/A 05/22/2015   Procedure: TRANSESOPHAGEAL ECHOCARDIOGRAM (TEE);   Surgeon: [name], MD;  Location: [place];  Service: Open Heart Surgery;  Laterality: N/A;    Allergies  Allergen Reactions  . Lipitor [Atorvastatin] Other (See Comments)    Leg pain (currently taking and tolerating Crestor)  . Zocor [Simvastatin] Other (See Comments)    Leg pain (currrently taking and tolerating Crestor)    Current Outpatient Prescriptions  Medication Sig Dispense Refill  . Ascorbic Acid (VITAMIN C) 1000 MG tablet Take 1,000 mg by mouth daily.    Marland Kitchen aspirin EC 81 MG tablet Take 1 tablet (81 mg total) by mouth daily.    . fish oil-omega-3 fatty acids 1000 MG capsule Take 1,000 g by mouth daily.     . folic acid (FOLVITE) 540 MCG tablet Take 400 mcg by mouth daily.    Marland Kitchen gabapentin (NEURONTIN) 300 MG capsule Take 600 mg by mouth every evening.     . Garlic 0867 MG TABS Take 1,250 mg by mouth daily.     Marland Kitchen lisinopril (PRINIVIL,ZESTRIL) 20 MG tablet Take 1 tablet (20 mg total) by mouth daily. 90 tablet 3  . metoprolol tartrate (LOPRESSOR) 25 MG tablet Take 1 tablet (25 mg total) by mouth 2 (two) times daily. 180 tablet 3  . Multiple Vitamin (MULTIVITAMIN) tablet Take 1 tablet by mouth daily.    . rosuvastatin (CRESTOR) 10 MG tablet TAKE 1 TABLET (10 MG TOTAL) BY MOUTH DAILY. 90 tablet 3  . SPIRIVA HANDIHALER 18 MCG inhalation capsule Place 18 mcg into inhaler and inhale daily.   8  . thiamine (VITAMIN B-1) 100 MG tablet Take 100 mg by mouth daily.    . vitamin E 400 UNIT capsule Take 400 Units by mouth daily.     No current facility-administered medications for this visit.      REVIEW OF SYSTEMS: See HPI for pertinent positives and negatives.  Physical Examination Vitals:   07/02/16 1516 07/02/16 1519 07/02/16 1521  BP: (!) 152/72 (!) 88/63 (!) 153/76  Pulse: 92 90 89  Resp: 20    Temp: 97 F (36.1 C)    TempSrc: Oral    SpO2: 94%    Weight: 158 lb (71.7 kg)    Height: 5\' 6"  (1.676 m)     Body mass index is 25.5 kg/m.  General:  A&O x 3, WDWN. Gait:  normal Eyes: PERRLA. Pulmonary: Respirations are non labored, CTAB, good air movement in all fields. Cardiac: regular rhythm and rate, no detected murmur.     Carotid Bruits Left Right   positive positive  Aorta is not palpable. Radial pulses: 2+ right, faintly palpable left radial, 1+ palpable left brachial    VASCULAR EXAM: Extremitieswithout ischemic changes  without Gangrene; without open wounds. Right forefoot and toes with moderate rubor.     LE Pulses LEFT RIGHT   FEMORAL  2+palpable 1+ palpable    POPLITEAL not palpable  not palpable   POSTERIOR TIBIAL not palpable  not palpable    DORSALIS PEDIS  ANTERIOR TIBIAL not palpable  not palpable    Abdomen: soft, NT, no palpable masses. Skin: no rashes, no ulcers. Musculoskeletal: no muscle wasting or atrophy. Neurologic: A&O X 3; Appropriate Affect ; SENSATION: normal; MOTOR FUNCTION: moving all extremities equally, motor strength 4/5 in upper extremities, 5/5 in lower extremities. Speech is fluent/normal. CN 2-12 intact     ASSESSMENT:  William [name] is a 76 y.o. male who is status post right external iliac artery stent placement in January 2011.  Pt was having claudication pain in both lower legs after walking about 50 feet before his CABG in February 2017. Now, after walking 100 yards, his right calf claudicates, relieved by a short rest; he states this has improved since [facility]   There are no signs of ischemia in his feet/legs, other than moderate rubor in his right forefoot and toes.  He states he feels better than he has in a long time. He also has bilateral carotid artery stenosis with no history of stroke or  TIA. Left brachial pressure is 60 mm Hg lower than the right brachial pressure; left radial pulse is faintly palpable, left brachial pulse is 1+ palpable. He denies any steal symptoms in his left upper extremity. His right brachial pressure is therefore the accurate pressure.   DATA  Today's carotid duplex suggests >50% bilateral ECA stenosis Retrograde left vertebral artery and monophasic left proximal subclavian artery flow patterns noted with a greater than 20 mm Hg difference in brachial pressures. 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 <40% (high end of range) right ICA stenosis,  and  <40% (high end of range) left ICA stenosis. Right ICA comparison not valid due to calcified plaque. No significant change in left ICA stenosis compared to exam of 12-26-15.  ABI: Right: 0.38 (0.34, 12-26-15), waveforms: monophasic; TBI: 0.15 (no waveform on last exam)  Left: 0.51 (0.50, 12-26-15), waveforms: monophasic; TBI: 0.34 (0.39 on last exam) ABI's remain stable with severely decreased arterial perfusion in right leg, moderately decreased in left leg, all monophasic waveforms.   PLAN:    [facility]   Based on today's exam and non-invasive vascular lab results, the patient will follow up in 6 months with the following tests: ABI's, carotid duplex in a year.  I discussed in depth with the patient the nature of atherosclerosis, and emphasized the importance of maximal medical management including strict control of blood pressure, blood glucose, and lipid levels, obtaining regular exercise, and cessation of smoking.  The patient is aware that without maximal medical management the underlying atherosclerotic disease process will progress, limiting the benefit of any interventions.  The patient was given information about stroke prevention and what symptoms should prompt the patient to seek immediate medical care.  The patient was  given information about PAD including signs, symptoms, treatment, what symptoms should prompt the patient to seek immediate medical care, and risk reduction measures to take. Thank you for allowing [place] to participate in this patient's care.  Clemon Chambers, RN, MSN, FNP-C [facility] Office: [phone]  Clinic MD: Early 07/02/2016 3:29 PM

## 2016-07-03 NOTE — Addendum Note (Signed)
Addended by: Lianne Cure A on: 07/03/2016 09:38 AM   Modules accepted: Orders

## 2016-07-14 ENCOUNTER — Other Ambulatory Visit (HOSPITAL_COMMUNITY): Payer: Self-pay | Admitting: Internal Medicine

## 2016-07-14 ENCOUNTER — Ambulatory Visit (HOSPITAL_COMMUNITY)
Admission: RE | Admit: 2016-07-14 | Discharge: 2016-07-14 | Disposition: A | Discharge status: Home / Self Care | Payer: Medicare Other | Source: Ambulatory Visit | Attending: Internal Medicine | Admitting: Internal Medicine

## 2016-07-14 DIAGNOSIS — J449 Chronic obstructive pulmonary disease, unspecified: Secondary | ICD-10-CM | POA: Diagnosis not present

## 2016-07-14 DIAGNOSIS — I10 Essential (primary) hypertension: Secondary | ICD-10-CM | POA: Diagnosis not present

## 2016-07-14 DIAGNOSIS — E785 Hyperlipidemia, unspecified: Secondary | ICD-10-CM | POA: Diagnosis not present

## 2016-07-14 DIAGNOSIS — E559 Vitamin D deficiency, unspecified: Secondary | ICD-10-CM | POA: Diagnosis not present

## 2016-07-14 DIAGNOSIS — R0602 Shortness of breath: Secondary | ICD-10-CM | POA: Diagnosis not present

## 2016-10-14 DIAGNOSIS — J449 Chronic obstructive pulmonary disease, unspecified: Secondary | ICD-10-CM | POA: Diagnosis not present

## 2016-10-14 DIAGNOSIS — I10 Essential (primary) hypertension: Secondary | ICD-10-CM | POA: Diagnosis not present

## 2016-10-14 DIAGNOSIS — Z125 Encounter for screening for malignant neoplasm of prostate: Secondary | ICD-10-CM | POA: Diagnosis not present

## 2016-10-14 DIAGNOSIS — E785 Hyperlipidemia, unspecified: Secondary | ICD-10-CM | POA: Diagnosis not present

## 2016-10-14 DIAGNOSIS — H532 Diplopia: Secondary | ICD-10-CM | POA: Diagnosis not present

## 2016-10-14 DIAGNOSIS — E559 Vitamin D deficiency, unspecified: Secondary | ICD-10-CM | POA: Diagnosis not present

## 2016-11-15 IMAGING — CR DG CHEST 1V PORT
1 series · 1 of 1 positions shown · non-contrast
Comparison: 05/24/2015, 05/23/2015, 05/22/2015, 05/18/2015

CLINICAL DATA: Chest tube.

EXAM:
PORTABLE CHEST 1 VIEW

[AP]
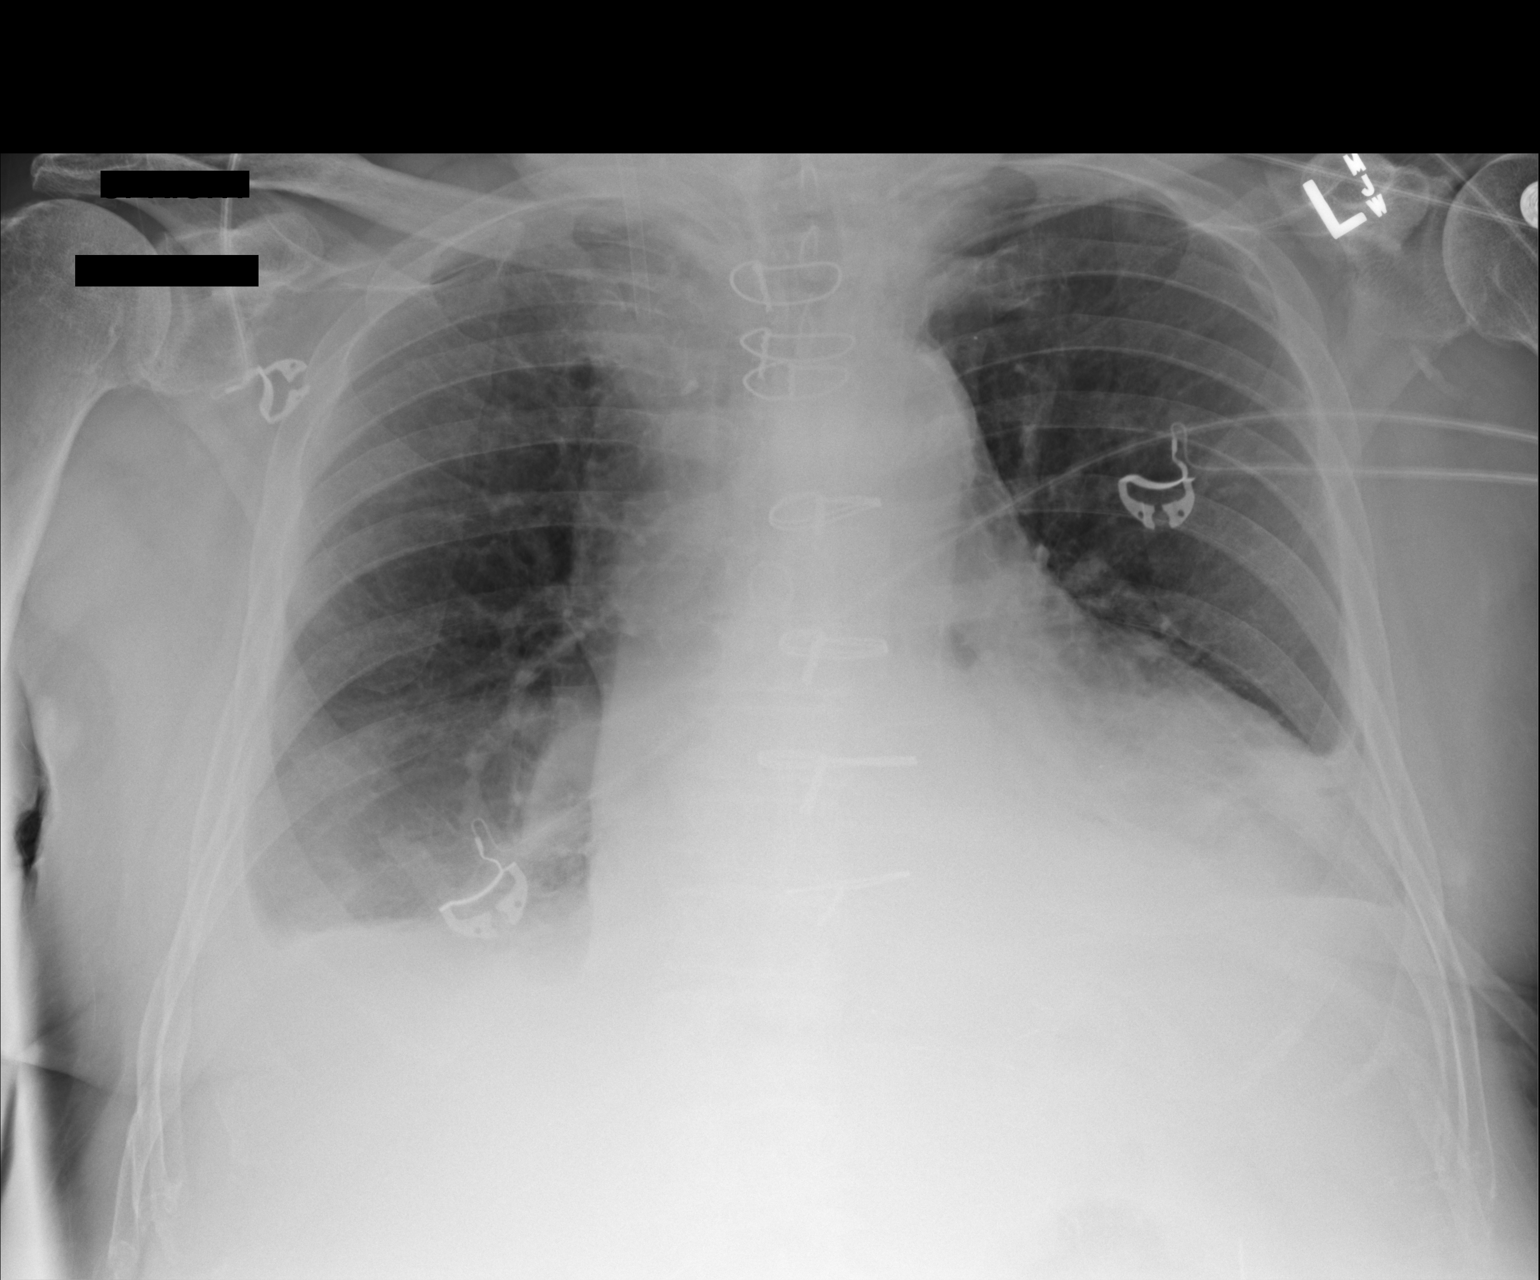

[1 of 1 positions shown; findings below may reference images not displayed]

FINDINGS: Right IJ sheath in stable position. Interim removal of left chest
tube. No definite pneumothorax. Left apical pleural thickening
unchanged from prior exams. Prior CABG. Stable cardiomegaly.
Persistent left lower lobe atelectasis and/or infiltrate.
Progressive right base atelectasis and/or infiltrate. Bilateral
pleural effusions again noted.
IMPRESSION: 1. Interim removal of left chest tube. No definite pneumothorax.
Left apical pleural markings are stable from multiple prior exams
and most likely related to scarring.
2. Persistent left lower lobe atelectasis and/or infiltrate.
Progressive right lower lobe atelectasis and/or infiltrate. Small
bilateral pleural effusions again noted.
3. Prior CABG. Stable cardiomegaly. No pulmonary venous congestion .

## 2016-12-17 ENCOUNTER — Ambulatory Visit (INDEPENDENT_AMBULATORY_CARE_PROVIDER_SITE_OTHER): Payer: Medicare Other | Admitting: Cardiovascular Disease

## 2016-12-17 ENCOUNTER — Encounter: Payer: Self-pay | Admitting: Cardiovascular Disease

## 2016-12-17 VITALS — BP 160/70 | HR 67 | Ht 66.0 in | Wt 158.0 lb

## 2016-12-17 DIAGNOSIS — I209 Angina pectoris, unspecified: Secondary | ICD-10-CM

## 2016-12-17 DIAGNOSIS — E7800 Pure hypercholesterolemia, unspecified: Secondary | ICD-10-CM

## 2016-12-17 DIAGNOSIS — I10 Essential (primary) hypertension: Secondary | ICD-10-CM | POA: Diagnosis not present

## 2016-12-17 DIAGNOSIS — I6523 Occlusion and stenosis of bilateral carotid arteries: Secondary | ICD-10-CM | POA: Diagnosis not present

## 2016-12-17 DIAGNOSIS — I25708 Atherosclerosis of coronary artery bypass graft(s), unspecified, with other forms of angina pectoris: Secondary | ICD-10-CM | POA: Diagnosis not present

## 2016-12-17 DIAGNOSIS — Z87891 Personal history of nicotine dependence: Secondary | ICD-10-CM | POA: Diagnosis not present

## 2016-12-17 MED ORDER — AMLODIPINE BESYLATE 5 MG PO TABS: 5.0000 mg | ORAL_TABLET | Freq: Every day | ORAL | 6 refills | Status: DC

## 2016-12-17 NOTE — Patient Instructions (Signed)
Medication Instructions:   Begin Amoldipine 5mg  daily.  Continue all other medications.    Labwork: none  Testing/Procedures: none  Follow-Up: Your physician wants you to follow up in: 6 months.  You will receive a reminder letter in the mail one-two months in advance.  If you don't receive a letter, please call our office to schedule the follow up appointment   Any Other Special Instructions Will Be Listed Below (If Applicable).  If you need a refill on your cardiac medications before your next appointment, please call your pharmacy.

## 2016-12-17 NOTE — Addendum Note (Signed)
Addended by: Laurine Blazer on: 12/17/2016 10:11 AM   Modules accepted: Orders

## 2016-12-17 NOTE — Progress Notes (Signed)
SUBJECTIVE: Mr. Granja presents for follow up of CAD (2-v CABG).  He was hospitalized in February 2017 with a non-STEMI. Coronary angiography demonstrated critical left main obstruction and thus underwent a LIMA to the LAD and saphenous vein graft to the first obtuse marginal branch. Additional past medical history includes hypertension, hyperlipidemia, COPD, tobacco abuse, and peripheral vascular disease with a history of right external iliac artery stenting and bilateral carotid artery stenosis.  Echocardiogram on 05/19/15 demonstrated normal left ventricular systolic function with ischemic wall motion abnormalities, EF 50-55%, mild LVH, grade 1 diastolic dysfunction, and mild to moderate aortic regurgitation.  He denies chest pain, leg swelling, palpitations, and shortness of breath. Claudication symptoms are improving.  He continues to exercise at the [facility] 6 days per week and does weights along with aerobic exercise.  He had an episode of double vision which last about 30 seconds 2 months ago.  Home blood pressures are consistently elevated. It was 157/87 this morning at home.   Review of Systems: As per "subjective", otherwise negative.  Allergies  Allergen Reactions  . Lipitor [Atorvastatin] Other (See Comments)    Leg pain (currently taking and tolerating Crestor)  . Zocor [Simvastatin] Other (See Comments)    Leg pain (currrently taking and tolerating Crestor)    Current Outpatient Prescriptions  Medication Sig Dispense Refill  . Ascorbic Acid (VITAMIN C) 1000 MG tablet Take 1,000 mg by mouth daily.    Marland Kitchen aspirin EC 81 MG tablet Take 1 tablet (81 mg total) by mouth daily.    . fish oil-omega-3 fatty acids 1000 MG capsule Take 1,000 g by mouth daily.     . folic acid (FOLVITE) 540 MCG tablet Take 400 mcg by mouth daily.    Marland Kitchen gabapentin (NEURONTIN) 300 MG capsule Take 600 mg by mouth every evening.     . Garlic 9811 MG TABS Take 1,250 mg by mouth daily.     Marland Kitchen lisinopril  (PRINIVIL,ZESTRIL) 20 MG tablet Take 1 tablet (20 mg total) by mouth daily. 90 tablet 3  . metoprolol tartrate (LOPRESSOR) 25 MG tablet Take 1 tablet (25 mg total) by mouth 2 (two) times daily. 180 tablet 3  . Multiple Vitamin (MULTIVITAMIN) tablet Take 1 tablet by mouth daily.    . rosuvastatin (CRESTOR) 10 MG tablet TAKE 1 TABLET (10 MG TOTAL) BY MOUTH DAILY. 90 tablet 3  . SPIRIVA HANDIHALER 18 MCG inhalation capsule Place 18 mcg into inhaler and inhale daily.   8  . thiamine (VITAMIN B-1) 100 MG tablet Take 100 mg by mouth daily.    . vitamin E 400 UNIT capsule Take 400 Units by mouth daily.     No current facility-administered medications for this visit.     Past Medical History:  Diagnosis Date  . Carotid artery occlusion   . Colon polyp   . COPD (chronic obstructive pulmonary disease) ([place])   . Coronary artery disease   . Hyperlipidemia   . Hypertension   . 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 Myocardial infarction ([place])   . Peripheral arterial disease [facility])     Past Surgical History:  Procedure Laterality Date  . Bilateral renal  artery stenoses  04/23/2009  . CARDIAC CATHETERIZATION N/A 05/21/2015   Procedure: Left Heart Cath and Coronary Angiography;  Surgeon: Lorretta Harp, MD;  Location: [place] [facility];  Service: Cardiovascular;  Laterality: N/A;  . CORONARY ARTERY BYPASS GRAFT N/A 05/22/2015   Procedure: CORONARY ARTERY BYPASS GRAFTING (CABG)  LIMA-LAD and SVG-OM EVH RIGHT THIGH GREATER SAPHENOUS VEIN;  Surgeon: [name], MD;  Location: [place];  Service: Open Heart Surgery;  Laterality: N/A;  . HIATAL HERNIA REPAIR    . Percutaneous translumnial angioplasty  04/23/2009   Catheterization of Lefst external iliac artery with Left lower extremity runoff  . TEE WITHOUT CARDIOVERSION N/A 05/22/2015   Procedure: TRANSESOPHAGEAL ECHOCARDIOGRAM (TEE);  Surgeon: [name], MD;  Location: [place];  Service: Open Heart Surgery;  Laterality: N/A;    Social History    Social History  . Marital status: Married    Spouse name: N/A  . Number of children: N/A  . Years of education: N/A   Occupational History  . Not on file.   Social History Main Topics  . Smoking status: Former Smoker    Packs/day: 2.00    Years: 30.00    Types: Cigarettes    Start date: 06/03/1961    Quit date: 11/17/1991  . Smokeless tobacco: Never Used  . Alcohol use No     Comment: used to   . Drug use: No  . Sexual activity: Not on file   Other Topics Concern  . Not on file   Social History Narrative  . No narrative on file     Vitals:   12/17/16 0932  BP: (!) 160/70  Pulse: 67  SpO2: 98%  Weight: 158 lb (71.7 kg)  Height: 5\' 6"  (1.676 m)    Wt Readings from Last 3 Encounters:  12/17/16 158 lb (71.7 kg)  07/02/16 158 lb (71.7 kg)  06/18/16 159 lb (72.1 kg)     PHYSICAL EXAM General: NAD HEENT: Normal. Neck: No JVD, no thyromegaly. Lungs: Clear to auscultation bilaterally with normal respiratory effort. CV: Nondisplaced PMI.  Regular rate and rhythm, normal S1/S2, no S3/S4, no murmur. No pretibial or periankle edema.  No carotid bruit.   Abdomen: Soft, nontender, no distention.  Neurologic: Alert and oriented.  Psych: Normal affect. Skin: Normal. Musculoskeletal: No gross deformities.    ECG: Most recent ECG reviewed.   Labs: Lab Results  Component Value Date/Time   K 4.2 05/29/2015 05:20 AM   BUN 23 (H) 05/29/2015 05:20 AM   CREATININE 0.95 05/29/2015 05:20 AM   ALT 23 05/19/2015 02:24 AM   TSH 2.894 05/19/2015 02:24 AM   HGB 9.2 (L) 05/26/2015 04:06 AM     Lipids: Lab Results  Component Value Date/Time   LDLCALC 89 05/19/2015 02:24 AM   CHOL 137 05/19/2015 02:24 AM   TRIG 74 05/19/2015 02:24 AM   HDL 33 (L) 05/19/2015 02:24 AM       ASSESSMENT AND PLAN:  1. CAD s/p 2-vessel CABG with NSTEMI: Symptomatically stable. Continue ASA, metoprolol, ACEI, and Crestor.   2. Essential HTN: Elevated. I will start amlodipine 5 mg  daily.  3. Hyperlipidemia: Continue Crestor. LDL 68 in 03/2016.  4. PVD: Follows with vascular surgery.      Disposition: Follow up 6 months.   Kate Sable, M.D., F.A.C.C.

## 2017-01-07 ENCOUNTER — Ambulatory Visit (INDEPENDENT_AMBULATORY_CARE_PROVIDER_SITE_OTHER)
Admission: RE | Admit: 2017-01-07 | Discharge: 2017-01-07 | Disposition: A | Discharge status: Home / Self Care | Payer: Medicare Other | Source: Ambulatory Visit | Attending: Vascular Surgery | Admitting: Vascular Surgery

## 2017-01-07 ENCOUNTER — Ambulatory Visit (INDEPENDENT_AMBULATORY_CARE_PROVIDER_SITE_OTHER): Payer: Medicare Other | Admitting: Family

## 2017-01-07 ENCOUNTER — Other Ambulatory Visit: Payer: Self-pay

## 2017-01-07 ENCOUNTER — Encounter: Payer: Self-pay | Admitting: Family

## 2017-01-07 ENCOUNTER — Ambulatory Visit (HOSPITAL_COMMUNITY)
Admission: RE | Admit: 2017-01-07 | Discharge: 2017-01-07 | Disposition: A | Discharge status: Home / Self Care | Payer: Medicare Other | Source: Ambulatory Visit | Attending: Vascular Surgery | Admitting: Vascular Surgery

## 2017-01-07 VITALS — BP 188/77 | HR 70 | Temp 95.9°F | Resp 18 | Ht 66.0 in | Wt 160.0 lb

## 2017-01-07 DIAGNOSIS — I70213 Atherosclerosis of native arteries of extremities with intermittent claudication, bilateral legs: Secondary | ICD-10-CM

## 2017-01-07 DIAGNOSIS — I6523 Occlusion and stenosis of bilateral carotid arteries: Secondary | ICD-10-CM

## 2017-01-07 DIAGNOSIS — Z95828 Presence of other vascular implants and grafts: Secondary | ICD-10-CM | POA: Diagnosis not present

## 2017-01-07 DIAGNOSIS — Z87891 Personal history of nicotine dependence: Secondary | ICD-10-CM | POA: Diagnosis not present

## 2017-01-07 DIAGNOSIS — I771 Stricture of artery: Secondary | ICD-10-CM

## 2017-01-07 NOTE — Patient Instructions (Addendum)
Peripheral Vascular Disease Peripheral vascular disease (PVD) is a disease of the blood vessels that are not part of your heart and brain. A simple term for PVD is poor circulation. In most cases, PVD narrows the blood vessels that carry blood from your heart to the rest of your body. This can result in a decreased supply of blood to your arms, legs, and internal organs, like your stomach or kidneys. However, it most often affects a person's lower legs and feet. There are two types of PVD.  Organic PVD. This is the more common type. It is caused by damage to the structure of blood vessels.  Functional PVD. This is caused by conditions that make blood vessels contract and tighten (spasm).  Without treatment, PVD tends to get worse over time. PVD can also lead to acute ischemic limb. This is when an arm or limb suddenly has trouble getting enough blood. This is a medical emergency. Follow these instructions at home:  Take medicines only as told by your doctor.  Do not use any tobacco products, including cigarettes, chewing tobacco, or electronic cigarettes. If you need help quitting, ask your doctor.  Lose weight if you are overweight, and maintain a healthy weight as told by your doctor.  Eat a diet that is low in fat and cholesterol. If you need help, ask your doctor.  Exercise regularly. Ask your doctor for some good activities for you.  Take good care of your feet. ? Wear comfortable shoes that fit well. ? Check your feet often for any cuts or sores. Contact a doctor if:  You have cramps in your legs while walking.  You have leg pain when you are at rest.  You have coldness in a leg or foot.  Your skin changes.  You are unable to get or have an erection (erectile dysfunction).  You have cuts or sores on your feet that are not healing. Get help right away if:  Your arm or leg turns cold and blue.  Your arms or legs become red, warm, swollen, painful, or numb.  You have  chest pain or trouble breathing.  You suddenly have weakness in your face, arm, or leg.  You become very confused or you cannot speak.  You suddenly have a very bad headache.  You suddenly cannot see. This information is not intended to replace advice given to you by your health care provider. Make sure you discuss any questions you have with your health care provider. Document Released: 06/11/2009 Document Revised: 08/23/2015 Document Reviewed: 08/25/2013 Elsevier Interactive Patient Education  2017 [employer].    Stroke Prevention Some medical conditions and behaviors are associated with an increased chance of having a stroke. You may prevent a stroke by making healthy choices and managing medical conditions. How can I reduce my risk of having a stroke?  Stay physically active. Get at least 30 minutes of activity on most or all days.  Do not smoke. It may also be helpful to avoid exposure to secondhand smoke.  Limit alcohol use. Moderate alcohol use is considered to be: ? No more than 2 drinks per day for men. ? No more than 1 drink per day for nonpregnant women.  Eat healthy foods. This involves: ? Eating 5 or more servings of fruits and vegetables a day. ? Making dietary changes that address high blood pressure (hypertension), high cholesterol, diabetes, or obesity.  Manage your cholesterol levels. ? Making food choices that are high in fiber and low in saturated  fat, trans fat, and cholesterol may control cholesterol levels. ? Take any prescribed medicines to control cholesterol as directed by your health care provider.  Manage your diabetes. ? Controlling your carbohydrate and sugar intake is recommended to manage diabetes. ? Take any prescribed medicines to control diabetes as directed by your health care provider.  Control your hypertension. ? Making food choices that are low in salt (sodium), saturated fat, trans fat, and cholesterol is recommended to manage  hypertension. ? Ask your health care provider if you need treatment to lower your blood pressure. Take any prescribed medicines to control hypertension as directed by your health care provider. ? If you are 47-14 years of age, have your blood pressure checked every 3-5 years. If you are 56 years of age or older, have your blood pressure checked every year.  Maintain a healthy weight. ? Reducing calorie intake and making food choices that are low in sodium, saturated fat, trans fat, and cholesterol are recommended to manage weight.  Stop drug abuse.  Avoid taking birth control pills. ? Talk to your health care provider about the risks of taking birth control pills if you are over 62 years old, smoke, get migraines, or have ever had a blood clot.  Get evaluated for sleep disorders (sleep apnea). ? Talk to your health care provider about getting a sleep evaluation if you snore a lot or have excessive sleepiness.  Take medicines only as directed by your health care provider. ? For some people, aspirin or blood thinners (anticoagulants) are helpful in reducing the risk of forming abnormal blood clots that can lead to stroke. If you have the irregular heart rhythm of atrial fibrillation, you should be on a blood thinner unless there is a good reason you cannot take them. ? Understand all your medicine instructions.  Make sure that other conditions (such as anemia or atherosclerosis) are addressed. Get help right away if:  You have sudden weakness or numbness of the face, arm, or leg, especially on one side of the body.  Your face or eyelid droops to one side.  You have sudden confusion.  You have trouble speaking (aphasia) or understanding.  You have sudden trouble seeing in one or both eyes.  You have sudden trouble walking.  You have dizziness.  You have a loss of balance or coordination.  You have a sudden, severe headache with no known cause.  You have new chest pain or an  irregular heartbeat. Any of these symptoms may represent a serious problem that is an emergency. Do not wait to see if the symptoms will go away. Get medical help at once. Call your local emergency services (911 in [place] Do not drive yourself to the hospital. This information is not intended to replace advice given to you by your health care provider. Make sure you discuss any questions you have with your health care provider. Document Released: 04/24/2004 Document Revised: 08/23/2015 Document Reviewed: 09/17/2012 Elsevier Interactive Patient Education  2017 [place].     Preventing Cerebrovascular Disease Arteries are blood vessels that carry blood that contains oxygen from the heart to all parts of the body. Cerebrovascular disease affects arteries that supply the brain. Any condition that blocks or disrupts blood flow to the brain can cause cerebrovascular disease. Brain cells that lose blood supply start to die within minutes (stroke). Stroke is the main danger of cerebrovascular disease. Atherosclerosis and high blood pressure are common causes of cerebrovascular disease. Atherosclerosis is narrowing and hardening of an  artery that results when fat, cholesterol, calcium, or other substances (plaque) build up inside an artery. Plaque reduces blood flow through the artery. High blood pressure increases the risk of bleeding inside the brain. Making diet and lifestyle changes to prevent atherosclerosis and high blood pressure lowers your risk of cerebrovascular disease. What nutrition changes can be made?  Eat more fruits, vegetables, and whole grains.  Reduce how much saturated fat you eat. To do this, eat less red meat and fewer full-fat dairy products.  Eat healthy proteins instead of red meat. Healthy proteins include: ? Fish. Eat fish that contains heart-healthy omega-3 fatty acids, twice a week. Examples include salmon, albacore tuna, mackerel, and  herring. ? Chicken. ? Nuts. ? Low-fat or nonfat yogurt.  Avoid processed meats, like bacon and lunchmeat.  Avoid foods that contain: ? A lot of sugar, such as sweets and drinks with added sugar. ? A lot of salt (sodium). Avoid adding extra salt to your food, as told by your health care provider. ? Trans fats, such as margarine and baked goods. Trans fats may be listed as "partially hydrogenated oils" on food labels.  Check food labels to see how much sodium, sugar, and trans fats are in foods.  Use vegetable oils that contain low amounts of saturated fat, such as olive oil or canola oil. What lifestyle changes can be made?  Drink alcohol in moderation. This means no more than 1 drink a day for nonpregnant women and 2 drinks a day for men. One drink equals 12 oz of beer, 5 oz of wine, or 1 oz of hard liquor.  If you are overweight, ask your health care provider to recommend a weight-loss plan for you. Losing 5-10 lb (2.2-4.5 kg) can reduce your risk of diabetes, atherosclerosis, and high blood pressure.  Exercise for 30?60 minutes on most days, or as much as told by your health care provider. ? Do moderate-intensity exercise, such as brisk walking, bicycling, and water aerobics. Ask your health care provider which activities are safe for you.  Do not use any products that contain nicotine or tobacco, such as cigarettes and e-cigarettes. If you need help quitting, ask your health care provider. Why are these changes important? Making these changes lowers your risk of many diseases that can cause cerebrovascular disease and stroke. Stroke is a leading cause of death and disability. Making these changes also improves your overall health and quality of life. What can I do to lower my risk? The following factors make you more likely to develop cerebrovascular disease:  Being overweight.  Smoking.  Being physically inactive.  Eating a high-fat diet.  Having certain health conditions,  such as: ? Diabetes. ? High blood pressure. ? Heart disease. ? Atherosclerosis. ? High cholesterol. ? Sickle cell disease.  Talk with your health care provider about your risk for cerebrovascular disease. Work with your health care provider to control diseases that you have that may contribute to cerebrovascular disease. Your health care provider may prescribe medicines to help prevent major causes of cerebrovascular disease. Where to find more information: Learn more about preventing cerebrovascular disease from:  [place], Lung, and [place]: MoAnalyst.de  Centers for Disease Control and Prevention: http://www.curry-wood.biz/  Summary  Cerebrovascular disease can lead to a stroke.  Atherosclerosis and high blood pressure are major causes of cerebrovascular disease.  Making diet and lifestyle changes can reduce your risk of cerebrovascular disease.  Work with your health care provider to get your risk factors under control  to reduce your risk of cerebrovascular disease. This information is not intended to replace advice given to you by your health care provider. Make sure you discuss any questions you have with your health care provider. Document Released: 04/01/2015 Document Revised: 10/05/2015 Document Reviewed: 04/01/2015 Elsevier Interactive Patient Education  2018 [employer].

## 2017-01-07 NOTE — Progress Notes (Signed)
[facility]    [facility]: Follow up peripheral artery occlusive disease  History of Present Illness William [name] is a 76 y.o. male patient of Dr. Scot Dock who is status post right external iliac artery stent placement in January 2011.  He also has bilateral extracranial carotid artery stenosis.  He returns today for follow up.  After walking 100 yards his right calf claudicates, relieved by a short rest; he states this has improved [facility]   He has finished cardiac rehab. He denies non healing wounds.  He denies any history of stroke or TIA.  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  He denies any tingling, numbness, cold sensation, pain, or weakness in either upper extremity.   Pt states his blood pressure this morning at home was 155/70; states he is working with his cardiologist to get his blood pressure to 923 systolic.   Pt Diabetic: No Pt smoker: former smoker, quit in the 1990's  Pt meds include:  Statin :Yes ASA: Yes Other anticoagulants/antiplatelets: no    Past Medical History:  Diagnosis Date  . Carotid artery occlusion   . Colon polyp   . COPD (chronic obstructive pulmonary disease) ([place])   . Coronary artery disease   . Hyperlipidemia   . Hypertension   . 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 Myocardial infarction ([place])   . Peripheral arterial disease [facility])     Social History Social History  Substance Use Topics  . Smoking status: Former Smoker    Packs/day: 2.00    Years: 30.00    Types: Cigarettes    Start date: 06/03/1961    Quit date: 11/17/1991  . Smokeless tobacco: Never Used  . Alcohol use No     Comment: used to     Family History Family History  Problem Relation Age of Onset  . Heart disease Father        Heart Disease before age 60  . Pulmonary embolism Father   . Deep vein  thrombosis Father   . Cancer Mother        Sarcoma    Past Surgical History:  Procedure Laterality Date  . Bilateral renal  artery stenoses  04/23/2009  . CARDIAC CATHETERIZATION N/A 05/21/2015   Procedure: Left Heart Cath and Coronary Angiography;  Surgeon: Lorretta Harp, MD;  Location: [place] [facility];  Service: Cardiovascular;  Laterality: N/A;  . CORONARY ARTERY BYPASS GRAFT N/A 05/22/2015   Procedure: CORONARY ARTERY BYPASS GRAFTING (CABG) LIMA-LAD and SVG-OM EVH RIGHT THIGH GREATER SAPHENOUS VEIN;  Surgeon: [name], MD;  Location: [facility];  Service: Open Heart Surgery;  Laterality: N/A;  . HIATAL HERNIA REPAIR    . Percutaneous translumnial angioplasty  04/23/2009   Catheterization of [place]st external iliac artery with Left lower extremity runoff  . TEE WITHOUT CARDIOVERSION N/A 05/22/2015   Procedure: TRANSESOPHAGEAL ECHOCARDIOGRAM (TEE);  Surgeon: [name], MD;  Location: [facility];  Service: Open Heart Surgery;  Laterality: N/A;    Allergies  Allergen Reactions  . Lipitor [Atorvastatin] Other (See Comments)    Leg pain (currently taking and tolerating Crestor)  . Zocor [Simvastatin] Other (See Comments)    Leg pain (currrently taking and tolerating Crestor)    Current Outpatient Prescriptions  Medication Sig Dispense Refill  . amLODipine (NORVASC) 5 MG tablet Take 1 tablet (  5 mg total) by mouth daily. 30 tablet 6  . Ascorbic Acid (VITAMIN C) 1000 MG tablet Take 1,000 mg by mouth daily.    Marland Kitchen aspirin EC 81 MG tablet Take 1 tablet (81 mg total) by mouth daily.    . fish oil-omega-3 fatty acids 1000 MG capsule Take 1,000 g by mouth daily.     . folic acid (FOLVITE) 749 MCG tablet Take 400 mcg by mouth daily.    Marland Kitchen gabapentin (NEURONTIN) 300 MG capsule Take 600 mg by mouth every evening.     . Garlic 4496 MG TABS Take 1,250 mg by mouth daily.     Marland Kitchen lisinopril (PRINIVIL,ZESTRIL) 20 MG tablet Take 1 tablet (20 mg total) by mouth daily. 90 tablet 3  . metoprolol  tartrate (LOPRESSOR) 25 MG tablet Take 1 tablet (25 mg total) by mouth 2 (two) times daily. 180 tablet 3  . Multiple Vitamin (MULTIVITAMIN) tablet Take 1 tablet by mouth daily.    . rosuvastatin (CRESTOR) 10 MG tablet TAKE 1 TABLET (10 MG TOTAL) BY MOUTH DAILY. 90 tablet 3  . SPIRIVA HANDIHALER 18 MCG inhalation capsule Place 18 mcg into inhaler and inhale daily.   8  . thiamine (VITAMIN B-1) 100 MG tablet Take 100 mg by mouth daily.    . vitamin E 400 UNIT capsule Take 400 Units by mouth daily.     No current facility-administered medications for this visit.     ROS: See HPI for pertinent positives and negatives.   Physical Examination  Vitals:   01/07/17 1511  BP: (!) 188/77  Pulse: 70  Resp: 18  Temp: (!) 95.9 F (35.5 C)  TempSrc: Oral  SpO2: 94%  Weight: 160 lb (72.6 kg)  Height: 5\' 6"  (1.676 m)   Body mass index is 25.82 kg/m.  General: A&O x 3, pleasant male with long beard. Gait: normal Eyes: PERRLA. Pulmonary: Respirations are non labored, CTAB, good air movement in all fields. Cardiac: regular rhythm and rate, no detected murmur.     Carotid Bruits Left Right   positive positive   Abdominal aortic pulse is not palpable. Radial pulses: 2+ right, faintly palpable left radial, 1+ palpable left brachial    VASCULAR EXAM: Extremitieswithout ischemic changes  without Gangrene; without open wounds. Capillary refill in all toes is 5 seconds, all toes are pink.      LE Pulses LEFT RIGHT   FEMORAL  2+palpable 1+ palpable    POPLITEAL not palpable  not palpable   POSTERIOR TIBIAL not palpable  not palpable    DORSALIS PEDIS  ANTERIOR TIBIAL not palpable  not palpable    Abdomen: soft, NT, no palpable  masses. Skin: no rashes, no ulcers. Musculoskeletal: no muscle wasting or atrophy. Neurologic: A&O X 3; appropriate affect ; SENSATION: normal; MOTOR FUNCTION: moving all extremities equally, motor strength 4/5 in upper extremities, 5/5 in lower extremities. Speech is fluent/normal. CN 2-12 intact     ASSESSMENT: William [name] is a 76 y.o. male who is status post right external iliac artery stent placement in January 2011.  Pt was having claudication pain in both lower legs after walking about 50 feet before his CABG in February 2017. Now, after walking 100 yards, his right calf claudicates, relieved by a short rest; he states this has improved [facility]   There are no signs of ischemia in his feet/legs, all toes are pink with 5 seconds capillary refill.   He states  he feels better than he has in a long time.  He also has bilateral carotid artery stenosis with no history of stroke or TIA. He has known left subclavian artery stenosis with left brachial pressures historically lower than right. He denies steal symptoms in his left upper extremity. His right brachial pressure is the accurate pressure.   DATA  Carotid Duplex (01/07/17): Bilateral ECA stenosis. Retrograde left vertebral artery and monophasic left proximal subclavian artery flow patterns, evidence consistent with a known left subclavian artery stenosis. Right vertebral artery flow is antegrade, right subclavian artery waveforms are normal.  Right ICA: 40-59% stenosis Left ICA: 1-39% stenosis (high end of range). No significant change compared to the exam on 12-26-15.    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    PLAN:    [facility]   Based on today's exam and non-invasive vascular lab results, the patient will follow up in 6 monthswith the following tests: ABI's, carotid duplex in a year.  I discussed in depth with the patient the nature of atherosclerosis, and emphasized the importance of maximal medical management including strict control of blood pressure, blood glucose, and lipid levels, obtaining regular exercise, and continued cessation of smoking.  The patient is aware that without maximal medical management the underlying atherosclerotic disease process will progress, limiting the benefit of any interventions.  The patient was given information about PAD including signs, symptoms, treatment, what symptoms should prompt the patient to seek immediate medical care, and risk reduction measures to take.  Clemon Chambers, RN, MSN, FNP-C [facility] [employer] Phone: [phone]  Clinic MD: Scot Dock  01/07/17 3:21 PM

## 2017-01-09 ENCOUNTER — Other Ambulatory Visit: Payer: Self-pay | Admitting: *Deleted

## 2017-01-09 MED ORDER — AMLODIPINE BESYLATE 5 MG PO TABS: 5.0000 mg | ORAL_TABLET | Freq: Every day | ORAL | 3 refills | Status: DC

## 2017-01-09 NOTE — Addendum Note (Signed)
Addended by: Lianne Cure A on: 01/09/2017 10:22 AM   Modules accepted: Orders

## 2017-01-14 DIAGNOSIS — J449 Chronic obstructive pulmonary disease, unspecified: Secondary | ICD-10-CM | POA: Diagnosis not present

## 2017-01-14 DIAGNOSIS — E785 Hyperlipidemia, unspecified: Secondary | ICD-10-CM | POA: Diagnosis not present

## 2017-01-14 DIAGNOSIS — I10 Essential (primary) hypertension: Secondary | ICD-10-CM | POA: Diagnosis not present

## 2017-01-14 DIAGNOSIS — E559 Vitamin D deficiency, unspecified: Secondary | ICD-10-CM | POA: Diagnosis not present

## 2017-01-14 DIAGNOSIS — Z23 Encounter for immunization: Secondary | ICD-10-CM | POA: Diagnosis not present

## 2017-01-18 ENCOUNTER — Other Ambulatory Visit: Payer: Self-pay | Admitting: Cardiovascular Disease

## 2017-01-21 ENCOUNTER — Other Ambulatory Visit: Payer: Self-pay | Admitting: Cardiovascular Disease

## 2017-01-28 DIAGNOSIS — I10 Essential (primary) hypertension: Secondary | ICD-10-CM | POA: Diagnosis not present

## 2017-01-28 DIAGNOSIS — Z23 Encounter for immunization: Secondary | ICD-10-CM | POA: Diagnosis not present

## 2017-01-28 DIAGNOSIS — J449 Chronic obstructive pulmonary disease, unspecified: Secondary | ICD-10-CM | POA: Diagnosis not present

## 2017-01-28 DIAGNOSIS — I2510 Atherosclerotic heart disease of native coronary artery without angina pectoris: Secondary | ICD-10-CM | POA: Diagnosis not present

## 2017-01-28 DIAGNOSIS — H11041 Peripheral pterygium, stationary, right eye: Secondary | ICD-10-CM | POA: Diagnosis not present

## 2017-05-04 DIAGNOSIS — I2510 Atherosclerotic heart disease of native coronary artery without angina pectoris: Secondary | ICD-10-CM | POA: Diagnosis not present

## 2017-05-04 DIAGNOSIS — I10 Essential (primary) hypertension: Secondary | ICD-10-CM | POA: Diagnosis not present

## 2017-05-04 DIAGNOSIS — R5383 Other fatigue: Secondary | ICD-10-CM | POA: Diagnosis not present

## 2017-05-04 DIAGNOSIS — E785 Hyperlipidemia, unspecified: Secondary | ICD-10-CM | POA: Diagnosis not present

## 2017-05-19 ENCOUNTER — Other Ambulatory Visit: Payer: Self-pay | Admitting: *Deleted

## 2017-05-19 MED ORDER — ROSUVASTATIN CALCIUM 10 MG PO TABS: 10.0000 mg | ORAL_TABLET | Freq: Every day | ORAL | 1 refills | Status: DC

## 2017-06-10 ENCOUNTER — Other Ambulatory Visit: Payer: Self-pay | Admitting: Cardiovascular Disease

## 2017-06-15 ENCOUNTER — Encounter: Payer: Self-pay | Admitting: Cardiovascular Disease

## 2017-06-15 ENCOUNTER — Other Ambulatory Visit: Payer: Self-pay | Admitting: *Deleted

## 2017-06-15 ENCOUNTER — Ambulatory Visit (INDEPENDENT_AMBULATORY_CARE_PROVIDER_SITE_OTHER): Payer: Medicare Other | Admitting: Cardiovascular Disease

## 2017-06-15 VITALS — BP 190/90 | HR 65 | Ht 66.0 in | Wt 164.2 lb

## 2017-06-15 DIAGNOSIS — I6523 Occlusion and stenosis of bilateral carotid arteries: Secondary | ICD-10-CM | POA: Diagnosis not present

## 2017-06-15 DIAGNOSIS — E785 Hyperlipidemia, unspecified: Secondary | ICD-10-CM

## 2017-06-15 DIAGNOSIS — I739 Peripheral vascular disease, unspecified: Secondary | ICD-10-CM | POA: Diagnosis not present

## 2017-06-15 DIAGNOSIS — R5383 Other fatigue: Secondary | ICD-10-CM

## 2017-06-15 DIAGNOSIS — I10 Essential (primary) hypertension: Secondary | ICD-10-CM | POA: Diagnosis not present

## 2017-06-15 DIAGNOSIS — I25708 Atherosclerosis of coronary artery bypass graft(s), unspecified, with other forms of angina pectoris: Secondary | ICD-10-CM

## 2017-06-15 MED ORDER — AMLODIPINE BESYLATE 10 MG PO TABS: 10.0000 mg | ORAL_TABLET | Freq: Every day | ORAL | 6 refills | Status: DC

## 2017-06-15 NOTE — Patient Instructions (Signed)
Medication Instructions:   Increase Amlodipine to 10mg daily.  Continue all other medications.    Labwork: none  Testing/Procedures: none  Follow-Up: Your physician wants you to follow up in: 6 months.  You will receive a reminder letter in the mail one-two months in advance.  If you don't receive a letter, please call our office to schedule the follow up appointment   Any Other Special Instructions Will Be Listed Below (If Applicable).  If you need a refill on your cardiac medications before your next appointment, please call your pharmacy.  

## 2017-06-15 NOTE — Progress Notes (Signed)
SUBJECTIVE: William [name] presents for follow up of CAD (2-v CABG).  He was hospitalized in February 2017 with a non-STEMI. Coronary angiography demonstrated critical left main obstruction and thus underwent a LIMA to the LAD and saphenous vein graft to the first obtuse marginal branch. Additional past medical history includes hypertension, hyperlipidemia, COPD, tobacco abuse, and peripheral vascular disease with a history of right external iliac artery stenting and bilateral carotid artery stenosis.  Echocardiogram on 05/19/15 demonstrated normal left ventricular systolic function with ischemic wall motion abnormalities, EF 50-55%, mild LVH, grade 1 diastolic dysfunction, and mild to moderate aortic regurgitation.  ECG performed in the office today which I ordered and personally interpreted demonstrates normal sinus rhythm with no ischemic ST segment or T-wave abnormalities, nor any arrhythmias.  He continues to feel fatigued.  He said his PCP may be starting him on testosterone.  He said his heart rate can get up to 100-123 bpm with exercise.  He exercises 6 days a week at the [facility].  He primarily eats chicken, salads, and vegetables including broccoli.  Blood pressures at home range from 150-171/60-70.  It was 153/73 this morning at home with a heart rate of 75 bpm.  He believes his blood pressure at his PCPs office was 124/70.  He eats out twice per week but primarily eats salads when he does so.  His daughter who is 87 years old was recently diagnosed with hypertension and also sees Dr. Anastasio Champion.  His son is a Software engineer.  Carotid artery Dopplers on 01/07/17 showed 1-39% left internal carotid artery stenosis and 40-59% right internal carotid artery stenosis.      Review of Systems: As per "subjective", otherwise negative.  Allergies  Allergen Reactions  . Lipitor [Atorvastatin] Other (See Comments)    Leg pain (currently taking and tolerating Crestor)  . Zocor  [Simvastatin] Other (See Comments)    Leg pain (currrently taking and tolerating Crestor)    Current Outpatient Medications  Medication Sig Dispense Refill  . amLODipine (NORVASC) 5 MG tablet TAKE 1 TABLET BY MOUTH EVERY DAY 90 tablet 1  . Ascorbic Acid (VITAMIN C) 1000 MG tablet Take 1,000 mg by mouth daily.    Marland Kitchen aspirin EC 81 MG tablet Take 1 tablet (81 mg total) by mouth daily.    . fish oil-omega-3 fatty acids 1000 MG capsule Take 1,000 g by mouth daily.     . folic acid (FOLVITE) 299 MCG tablet Take 400 mcg by mouth daily.    Marland Kitchen gabapentin (NEURONTIN) 300 MG capsule Take 600 mg by mouth every evening.     . Garlic 2426 MG TABS Take 1,250 mg by mouth daily.     Marland Kitchen lisinopril (PRINIVIL,ZESTRIL) 20 MG tablet Take 1 tablet (20 mg total) by mouth daily. 90 tablet 3  . metoprolol tartrate (LOPRESSOR) 25 MG tablet TAKE 1 TABLET BY MOUTH TWICE A DAY 180 tablet 3  . Multiple Vitamin (MULTIVITAMIN) tablet Take 1 tablet by mouth daily.    . rosuvastatin (CRESTOR) 10 MG tablet Take 1 tablet (10 mg total) by mouth daily. 90 tablet 1  . SPIRIVA HANDIHALER 18 MCG inhalation capsule Place 18 mcg into inhaler and inhale daily.   8  . thiamine (VITAMIN B-1) 100 MG tablet Take 100 mg by mouth daily.    . vitamin E 400 UNIT capsule Take 400 Units by mouth daily.     No current facility-administered medications for this visit.     Past Medical History:  Diagnosis Date  . Carotid artery occlusion   . Colon polyp   . COPD (chronic obstructive pulmonary disease) ([place])   . Coronary artery disease   . Hyperlipidemia   . Hypertension   . 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 Myocardial infarction ([place])   . Peripheral arterial disease [facility])     Past Surgical History:  Procedure Laterality Date  . Bilateral renal  artery stenoses  04/23/2009  . CARDIAC CATHETERIZATION N/A 05/21/2015   Procedure: Left Heart Cath and Coronary Angiography;  Surgeon: Lorretta Harp, MD;  Location: [place] [facility];  Service:  Cardiovascular;  Laterality: N/A;  . CORONARY ARTERY BYPASS GRAFT N/A 05/22/2015   Procedure: CORONARY ARTERY BYPASS GRAFTING (CABG) LIMA-LAD and SVG-OM EVH RIGHT THIGH GREATER SAPHENOUS VEIN;  Surgeon: [name], MD;  Location: [place];  Service: Open Heart Surgery;  Laterality: N/A;  . HIATAL HERNIA REPAIR    . Percutaneous translumnial angioplasty  04/23/2009   Catheterization of Lefst external iliac artery with Left lower extremity runoff  . TEE WITHOUT CARDIOVERSION N/A 05/22/2015   Procedure: TRANSESOPHAGEAL ECHOCARDIOGRAM (TEE);  Surgeon: [name], MD;  Location: [place];  Service: Open Heart Surgery;  Laterality: N/A;    Social History   Socioeconomic History  . Marital status: Married    Spouse name: Not on file  . Number of children: Not on file  . Years of education: Not on file  . Highest education level: Not on file  Social Needs  . Financial resource strain: Not on file  . Food insecurity - worry: Not on file  . Food insecurity - inability: Not on file  . Transportation needs - medical: Not on file  . Transportation needs - non-medical: Not on file  Occupational History  . Not on file  Tobacco Use  . Smoking status: Former Smoker    Packs/day: 2.00    Years: 30.00    Pack years: 60.00    Types: Cigarettes    Start date: 06/03/1961    Last attempt to quit: 11/17/1991    Years since quitting: 25.5  . Smokeless tobacco: Never Used  Substance and Sexual Activity  . Alcohol use: No    Alcohol/week: 1.2 - 2.4 oz    Types: 2 - 4 Standard drinks or equivalent per week    Comment: used to   . Drug use: No  . Sexual activity: Not on file  Other Topics Concern  . Not on file  Social History Narrative  . Not on file     Vitals:   06/15/17 0853 06/15/17 0906  BP: (!) 194/76 (!) 190/90  Pulse: 65   SpO2: 98%   Weight: 164 lb 3.2 oz (74.5 kg)   Height: 5\' 6"  (1.676 m)     Wt Readings from Last 3 Encounters:  06/15/17 164 lb 3.2 oz (74.5 kg)  01/07/17  160 lb (72.6 kg)  12/17/16 158 lb (71.7 kg)     PHYSICAL EXAM General: NAD HEENT: Normal. Neck: No JVD, no thyromegaly. Lungs: Clear to auscultation bilaterally with normal respiratory effort. CV: Regular rate and rhythm, normal S1/S2, no S3/S4, no murmur. No pretibial or periankle edema.  Bilateral carotid bruits.   Abdomen: Soft, nontender, no distention.  Neurologic: Alert and oriented.  Psych: Normal affect. Skin: Normal. Musculoskeletal: No gross deformities.    ECG: Most recent ECG reviewed.   Labs: Lab Results  Component Value Date/Time   K 4.2 05/29/2015 05:20 AM   BUN  23 (H) 05/29/2015 05:20 AM   CREATININE 0.95 05/29/2015 05:20 AM   ALT 23 05/19/2015 02:24 AM   TSH 2.894 05/19/2015 02:24 AM   HGB 9.2 (L) 05/26/2015 04:06 AM     Lipids: Lab Results  Component Value Date/Time   LDLCALC 89 05/19/2015 02:24 AM   CHOL 137 05/19/2015 02:24 AM   TRIG 74 05/19/2015 02:24 AM   HDL 33 (L) 05/19/2015 02:24 AM       ASSESSMENT AND PLAN:  1. CAD s/p 2-vessel CABG with NSTEMI: Symptomatically stable. Continue ASA, metoprolol, ACEI, and Crestor.   2. Essential HTN: Blood pressure is severely elevated.  It ranges from 150-171/60-70 at home.  I will increase amlodipine to 10 mg daily.  3. Hyperlipidemia: Continue Crestor.  I will obtain a copy of most recent lipids from his PCP.  4. PVD/Bilateral carotid artery stenosis: Follows with vascular surgery.  Continue aspirin and statin. Carotid artery Dopplers on 01/07/17 showed 1-39% left internal carotid artery stenosis and 40-59% right internal carotid artery stenosis.  5.  Fatigue: He is on a low-dose of beta-blockers so I do not suspect this is causing this.  Heart rate is normal and he does not appear to become bradycardic at home.  He said his testosterone levels may be low and he may start receiving supplementation.  I will obtain a copy of labs from his PCP.     Disposition: Follow up 6 months   Kate Sable, M.D., F.A.C.C.

## 2017-06-25 ENCOUNTER — Encounter: Payer: Self-pay | Admitting: *Deleted

## 2017-07-04 ENCOUNTER — Other Ambulatory Visit: Payer: Self-pay | Admitting: Cardiovascular Disease

## 2017-07-14 ENCOUNTER — Ambulatory Visit (HOSPITAL_COMMUNITY)
Admission: RE | Admit: 2017-07-14 | Discharge: 2017-07-14 | Disposition: A | Discharge status: Home / Self Care | Payer: Medicare Other | Source: Ambulatory Visit | Attending: Family | Admitting: Family

## 2017-07-14 ENCOUNTER — Other Ambulatory Visit: Payer: Self-pay

## 2017-07-14 ENCOUNTER — Ambulatory Visit (INDEPENDENT_AMBULATORY_CARE_PROVIDER_SITE_OTHER): Payer: Medicare Other | Admitting: Family

## 2017-07-14 ENCOUNTER — Encounter: Payer: Self-pay | Admitting: Family

## 2017-07-14 VITALS — BP 170/75 | HR 63 | Resp 20 | Ht 66.0 in | Wt 162.3 lb

## 2017-07-14 DIAGNOSIS — I771 Stricture of artery: Secondary | ICD-10-CM

## 2017-07-14 DIAGNOSIS — I6523 Occlusion and stenosis of bilateral carotid arteries: Secondary | ICD-10-CM | POA: Diagnosis not present

## 2017-07-14 DIAGNOSIS — Z87891 Personal history of nicotine dependence: Secondary | ICD-10-CM | POA: Diagnosis not present

## 2017-07-14 DIAGNOSIS — I779 Disorder of arteries and arterioles, unspecified: Secondary | ICD-10-CM

## 2017-07-14 DIAGNOSIS — Z95828 Presence of other vascular implants and grafts: Secondary | ICD-10-CM | POA: Diagnosis not present

## 2017-07-14 DIAGNOSIS — I70213 Atherosclerosis of native arteries of extremities with intermittent claudication, bilateral legs: Secondary | ICD-10-CM | POA: Insufficient documentation

## 2017-07-14 NOTE — Progress Notes (Signed)
[facility] [place]   CC: Follow up peripheral artery occlusive disease   History of Present Illness William [name] is a 77 y.o. male who is status post right external iliac artery stent placement in January 2011 by Dr. Scot Dock.  He also has bilateral extracranial carotid artery stenosis.   After walking 100 yardsboth calvesclaudicate,relieved by a short rest, pt states this has been about the same. He has finished cardiac rehab. He denies non healing wounds.  He denies any history of stroke or TIA.  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  He denies any tingling, numbness, cold sensation, pain, or weakness in either upper extremity.   Pt states his blood pressure this morning at home was 143/73; states he is working with his cardiologist to get his blood pressure under better control.   Pt Diabetic: No Pt smoker: former smoker, quit in the 1990's  Pt meds include:  Statin :Yes ASA: Yes Other anticoagulants/antiplatelets: no     Past Medical History:  Diagnosis Date  . Carotid artery occlusion   . Colon polyp   . COPD (chronic obstructive pulmonary disease) ([place])   . Coronary artery disease   . Hyperlipidemia   . Hypertension   . 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 Myocardial infarction ([place])   . Peripheral arterial disease ([place])     Social History Social History   Tobacco Use  . Smoking status: Former Smoker    Packs/day: 2.00    Years: 30.00    Pack years: 60.00    Types: Cigarettes    Start date: 06/03/1961    Last attempt to quit: 11/17/1991    Years since quitting: 25.6  . Smokeless tobacco: Never Used  Substance Use Topics  . Alcohol use: No    Alcohol/week: 1.2 - 2.4 oz    Types: 2 - 4 Standard drinks or equivalent per week    Comment: used to   . Drug use: No    Family History Family History  Problem Relation Age of Onset   . Heart disease Father        Heart Disease before age 22  . Pulmonary embolism Father   . Deep vein thrombosis Father   . Cancer Mother        Sarcoma    Past Surgical History:  Procedure Laterality Date  . Bilateral renal  artery stenoses  04/23/2009  . CARDIAC CATHETERIZATION N/A 05/21/2015   Procedure: Left Heart Cath and Coronary Angiography;  Surgeon: Lorretta Harp, MD;  Location: [place] [facility];  Service: Cardiovascular;  Laterality: N/A;  . CORONARY ARTERY BYPASS GRAFT N/A 05/22/2015   Procedure: CORONARY ARTERY BYPASS GRAFTING (CABG) LIMA-LAD and SVG-OM EVH RIGHT THIGH GREATER SAPHENOUS VEIN;  Surgeon: [name], MD;  Location: [place];  Service: Open Heart Surgery;  Laterality: N/A;  . HIATAL HERNIA REPAIR    . Percutaneous translumnial angioplasty  04/23/2009   Catheterization of Lefst external iliac artery with Left lower extremity runoff  . TEE WITHOUT CARDIOVERSION N/A 05/22/2015   Procedure: TRANSESOPHAGEAL ECHOCARDIOGRAM (TEE);  Surgeon: [name], MD;  Location: [place];  Service: Open Heart Surgery;  Laterality: N/A;    Allergies  Allergen Reactions  . Lipitor [Atorvastatin] Other (See Comments)    Leg pain (currently taking and tolerating Crestor)  . Zocor [Simvastatin] Other (See Comments)    Leg  pain (currrently taking and tolerating Crestor)    Current Outpatient Medications  Medication Sig Dispense Refill  . amLODipine (NORVASC) 10 MG tablet Take 1 tablet (10 mg total) by mouth daily. 30 tablet 6  . Ascorbic Acid (VITAMIN C) 1000 MG tablet Take 1,000 mg by mouth daily.    Marland Kitchen aspirin EC 81 MG tablet Take 1 tablet (81 mg total) by mouth daily.    . fish oil-omega-3 fatty acids 1000 MG capsule Take 1,000 g by mouth daily.     . folic acid (FOLVITE) 099 MCG tablet Take 400 mcg by mouth daily.    Marland Kitchen gabapentin (NEURONTIN) 300 MG capsule Take 600 mg by mouth every evening.     . Garlic 8338 MG TABS Take 1,250 mg by mouth daily.     Marland Kitchen lisinopril  (PRINIVIL,ZESTRIL) 20 MG tablet TAKE 1 TABLET BY MOUTH EVERY DAY 90 tablet 1  . metoprolol tartrate (LOPRESSOR) 25 MG tablet TAKE 1 TABLET BY MOUTH TWICE A DAY 180 tablet 3  . Multiple Vitamin (MULTIVITAMIN) tablet Take 1 tablet by mouth daily.    . rosuvastatin (CRESTOR) 10 MG tablet Take 1 tablet (10 mg total) by mouth daily. 90 tablet 1  . SPIRIVA HANDIHALER 18 MCG inhalation capsule Place 18 mcg into inhaler and inhale daily.   8  . thiamine (VITAMIN B-1) 100 MG tablet Take 100 mg by mouth daily.    . vitamin E 400 UNIT capsule Take 400 Units by mouth daily.     No current facility-administered medications for this visit.     ROS: See HPI for pertinent positives and negatives.   Physical Examination  Vitals:   07/14/17 1007 07/14/17 1008  BP: (!) 174/76 (!) 170/75  Pulse: 63   Resp: 20   SpO2: 95%   Weight: 162 lb 4.8 oz (73.6 kg)   Height: 5\' 6"  (1.676 m)    Body mass index is 26.2 kg/m.  General: A&O x 3, pleasant male with long beard. Gait: normal Eyes: PERRLA. Pulmonary: Respirations are non labored, CTAB, good air movement in all fields. Cardiac: regular rhythm and rate, no detected murmur.    Carotid Bruits Left Right   positive positive   Abdominal aortic pulse is not palpable. Radial pulses: 2+ right, faintly palpable left radial, 1+ palpable left brachial    VASCULAR EXAM: Extremitieswithout ischemic changes  without Gangrene; without open wounds. Capillary refill in all toes is 5 seconds, all toes are pink.      LE Pulses LEFT RIGHT   FEMORAL  2+palpable 1+ palpable    POPLITEAL not palpable  not palpable   POSTERIOR TIBIAL not palpable  not palpable    DORSALIS PEDIS  ANTERIOR TIBIAL not  palpable  not palpable    Abdomen: soft, NT, no palpable masses. Skin: no rashes, no cellulitis, no ulcers noted. Musculoskeletal: no muscle wasting or atrophy.  Neurologic: A&O X 3; appropriate affect, Sensation is normal; MOTOR FUNCTION:  moving all extremities equally, motor strength 5/5 throughout. Speech is fluent/normal. CN 2-12 intact. Psychiatric: Thought content is normal, mood appropriate for clinical situation.     ASSESSMENT: William [name] is a 77 y.o. male who is status post right external iliac artery stent placement in January 2011.  Pt was having claudication pain in both lower legs after walking about 50 feet before his CABG in February 2017. Now, after walking 100 yards,both calvesclaudicate,relieved by a short rest; he states this has has not changed. There are no signs of  ischemia in his feet/legs, all toes are pink with 5 seconds capillary refill.    Right femoral pulse was 1+ palpable at his last visit, is no longer palpable, but bilateral ABI's have improved, and his claudication sx's remain stable.  He also has bilateral carotid artery stenosis with no history of stroke or TIA. He has known left subclavian artery stenosis with left brachial pressures historically lower than right. He denies steal symptoms in his left upper extremity. His right brachial pressure is the accurate pressure.   DATA  ABI (Date: 07/14/2017):  R:   ABI: 0.44 (was 0.31 on 01-07-17),   PT: dampened monophasic   DP: dampened monophasic    TBI:  0.20 (was 0.22)  L:   ABI: 0.64 (was 0.46),   PT: mono  DP: waveform morphology not documented  TBI: 0.37 (was 0.37)  Improvement in bilateral ABI with dampened monophasic and monophasic waveforms; severe disease in the right, moderate disease in the left.   Stable bilateral TBI.    Carotid Duplex (01/07/17): Bilateral ECA stenosis. Retrograde left vertebral artery and monophasic left proximal subclavian artery  flow patterns, evidence consistent with a known left subclavian artery stenosis. Right vertebral artery flow is antegrade, right subclavian artery waveforms are normal.  Right ICA: 40-59% stenosis Left ICA: 1-39% stenosis (high end of range). No significant change compared to the exam on 12-26-15.     PLAN:   [facility].  Based on today's exam and non-invasive vascular lab results, and after discussing with Dr. Scot Dock, the patient will follow up in 2-3 weeks with right aortoiliac duplex, see Dr. Scot Dock afterward. 6  monthswith the following tests: ABI's and carotid duplex.  I discussed in depth with the patient the nature of atherosclerosis, and emphasized the importance of maximal medical management including strict control of blood pressure, blood glucose, and lipid levels, obtaining regular exercise, and continued cessation of smoking.  The patient is aware that without maximal medical management the underlying atherosclerotic disease process will progress, limiting the benefit of any interventions.  The patient was given information about PAD including signs, symptoms, treatment, what symptoms should prompt the patient to seek immediate medical care, and risk reduction measures to take.  Clemon Chambers, RN, MSN, FNP-C [facility] [employer] Phone: [phone]  Clinic MD: Fields/Dickson  07/14/17 10:10 AM

## 2017-07-14 NOTE — Patient Instructions (Addendum)
Before your next abdominal ultrasound:  Take two Extra-Strength Gas-X capsules at bedtime the night before the test. Take another two Extra-Strength Gas-X capsules 3 hours before the test.  Avoid gas forming foods and beverages the day before the test.       Peripheral Vascular Disease Peripheral vascular disease (PVD) is a disease of the blood vessels that are not part of your heart and brain. A simple term for PVD is poor circulation. In most cases, PVD narrows the blood vessels that carry blood from your heart to the rest of your body. This can result in a decreased supply of blood to your arms, legs, and internal organs, like your stomach or kidneys. However, it most often affects a person's lower legs and feet. There are two types of PVD.  Organic PVD. This is the more common type. It is caused by damage to the structure of blood vessels.  Functional PVD. This is caused by conditions that make blood vessels contract and tighten (spasm).  Without treatment, PVD tends to get worse over time. PVD can also lead to acute ischemic limb. This is when an arm or limb suddenly has trouble getting enough blood. This is a medical emergency. Follow these instructions at home:  Take medicines only as told by your doctor.  Do not use any tobacco products, including cigarettes, chewing tobacco, or electronic cigarettes. If you need help quitting, ask your doctor.  Lose weight if you are overweight, and maintain a healthy weight as told by your doctor.  Eat a diet that is low in fat and cholesterol. If you need help, ask your doctor.  Exercise regularly. Ask your doctor for some good activities for you.  Take good care of your feet. ? Wear comfortable shoes that fit well. ? Check your feet often for any cuts or sores. Contact a doctor if:  You have cramps in your legs while walking.  You have leg pain when you are at rest.  You have coldness in a leg or foot.  Your skin changes.  You  are unable to get or have an erection (erectile dysfunction).  You have cuts or sores on your feet that are not healing. Get help right away if:  Your arm or leg turns cold and blue.  Your arms or legs become red, warm, swollen, painful, or numb.  You have chest pain or trouble breathing.  You suddenly have weakness in your face, arm, or leg.  You become very confused or you cannot speak.  You suddenly have a very bad headache.  You suddenly cannot see. This information is not intended to replace advice given to you by your health care provider. Make sure you discuss any questions you have with your health care provider. Document Released: 06/11/2009 Document Revised: 08/23/2015 Document Reviewed: 08/25/2013 Elsevier Interactive Patient Education  2017 [employer].

## 2017-07-16 ENCOUNTER — Other Ambulatory Visit: Payer: Self-pay

## 2017-07-16 DIAGNOSIS — Z95828 Presence of other vascular implants and grafts: Secondary | ICD-10-CM

## 2017-07-29 ENCOUNTER — Ambulatory Visit (HOSPITAL_COMMUNITY)
Admission: RE | Admit: 2017-07-29 | Discharge: 2017-07-29 | Disposition: A | Discharge status: Home / Self Care | Payer: Medicare Other | Source: Ambulatory Visit | Attending: Family | Admitting: Family

## 2017-07-29 ENCOUNTER — Other Ambulatory Visit: Payer: Self-pay | Admitting: *Deleted

## 2017-07-29 ENCOUNTER — Encounter: Payer: Self-pay | Admitting: Vascular Surgery

## 2017-07-29 ENCOUNTER — Ambulatory Visit (INDEPENDENT_AMBULATORY_CARE_PROVIDER_SITE_OTHER): Payer: Medicare Other | Admitting: Vascular Surgery

## 2017-07-29 ENCOUNTER — Encounter: Payer: Self-pay | Admitting: *Deleted

## 2017-07-29 VITALS — BP 144/64 | HR 64 | Resp 20 | Ht 66.0 in | Wt 158.0 lb

## 2017-07-29 DIAGNOSIS — I708 Atherosclerosis of other arteries: Secondary | ICD-10-CM | POA: Insufficient documentation

## 2017-07-29 DIAGNOSIS — Z95828 Presence of other vascular implants and grafts: Secondary | ICD-10-CM | POA: Diagnosis not present

## 2017-07-29 DIAGNOSIS — I739 Peripheral vascular disease, unspecified: Secondary | ICD-10-CM

## 2017-07-29 DIAGNOSIS — I779 Disorder of arteries and arterioles, unspecified: Secondary | ICD-10-CM | POA: Diagnosis not present

## 2017-07-29 DIAGNOSIS — I6521 Occlusion and stenosis of right carotid artery: Secondary | ICD-10-CM | POA: Diagnosis not present

## 2017-07-29 NOTE — Progress Notes (Signed)
Patient name: William [name] MRN: [id number] DOB: [birth year] Sex: male  REASON FOR VISIT:   Follow-up of peripheral vascular disease.   HPI:   William [name] is a pleasant 77 y.o. male was seen by the nurse practitioner on 07/14/2017.  He had a right external iliac artery stent in January 2011.  Noninvasive studies during his last visit she had an ABI of 44% on the right and 64% on the left.  He was set up for an aortoiliac duplex and a follow-up visit with me.  The patient describes bilateral calf claudication which is more significant on the right side.  He denies hip or thigh claudication.  Symptoms are brought on by ambulation and relieved with rest.  He denies any rest pain or nonhealing ulcers.   We have also been following him with carotid disease.  Carotid duplex scan on 01/07/2017 showed a 40 to 59% right carotid stenosis with no significant stenosis on the left.  Patient denies any history of stroke, TIAs, expressive or receptive aphasia, or amaurosis fugax.  Past Medical History:  Diagnosis Date  . Carotid artery occlusion   . Colon polyp   . COPD (chronic obstructive pulmonary disease) ([place])   . Coronary artery disease   . Hyperlipidemia   . Hypertension   . 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 Myocardial infarction ([place])   . Peripheral arterial disease ([facility])     Family History  Problem Relation Age of Onset  . Heart disease Father        Heart Disease before age 67  . Pulmonary embolism Father   . Deep vein thrombosis Father   . Cancer Mother        Sarcoma    SOCIAL HISTORY: He has a remote history of tobacco use. Social History   Tobacco Use  . Smoking status: Former Smoker    Packs/day: 2.00    Years: 30.00    Pack years: 60.00    Types: Cigarettes    Start date: 06/03/1961    Last attempt to quit: 11/17/1991    Years since quitting: 25.7  . Smokeless tobacco: Never Used  Substance Use Topics  . Alcohol use: No    Alcohol/week: 1.2 - 2.4 oz    Types: 2 - 4  Standard drinks or equivalent per week    Comment: used to     Allergies  Allergen Reactions  . Lipitor [Atorvastatin] Other (See Comments)    Leg pain (currently taking and tolerating Crestor)  . Zocor [Simvastatin] Other (See Comments)    Leg pain (currrently taking and tolerating Crestor)    Current Outpatient Medications  Medication Sig Dispense Refill  . amLODipine (NORVASC) 10 MG tablet Take 1 tablet (10 mg total) by mouth daily. 30 tablet 6  . Ascorbic Acid (VITAMIN C) 1000 MG tablet Take 1,000 mg by mouth daily.    Marland Kitchen aspirin EC 81 MG tablet Take 1 tablet (81 mg total) by mouth daily.    . fish oil-omega-3 fatty acids 1000 MG capsule Take 1,000 g by mouth daily.     . folic acid (FOLVITE) 850 MCG tablet Take 400 mcg by mouth daily.    Marland Kitchen gabapentin (NEURONTIN) 300 MG capsule Take 600 mg by mouth every evening.     . Garlic 2774 MG TABS Take 1,250 mg by mouth daily.     Marland Kitchen lisinopril (PRINIVIL,ZESTRIL) 20 MG tablet TAKE 1 TABLET BY MOUTH EVERY DAY 90 tablet 1  . metoprolol tartrate (  LOPRESSOR) 25 MG tablet TAKE 1 TABLET BY MOUTH TWICE A DAY 180 tablet 3  . Multiple Vitamin (MULTIVITAMIN) tablet Take 1 tablet by mouth daily.    . rosuvastatin (CRESTOR) 10 MG tablet Take 1 tablet (10 mg total) by mouth daily. 90 tablet 1  . SPIRIVA HANDIHALER 18 MCG inhalation capsule Place 18 mcg into inhaler and inhale daily.   8  . thiamine (VITAMIN B-1) 100 MG tablet Take 100 mg by mouth daily.    . vitamin E 400 UNIT capsule Take 400 Units by mouth daily.     No current facility-administered medications for this visit.     REVIEW OF SYSTEMS:  [X]  denotes positive finding, [ ]  denotes negative finding Cardiac  Comments:  Chest pain or chest pressure:    Shortness of breath upon exertion:    Short of breath when lying flat:    Irregular heart rhythm:        Vascular    Pain in calf, thigh, or hip brought on by ambulation:    Pain in feet at night that wakes you up from your sleep:       Blood clot in your veins:    Leg swelling:         Pulmonary    Oxygen at home:    Productive cough:     Wheezing:         Neurologic    Sudden weakness in arms or legs:     Sudden numbness in arms or legs:     Sudden onset of difficulty speaking or slurred speech:    Temporary loss of vision in one eye:     Problems with dizziness:         Gastrointestinal    Blood in stool:     Vomited blood:         Genitourinary    Burning when urinating:     Blood in urine:        Psychiatric    Major depression:         Hematologic    Bleeding problems:    Problems with blood clotting too easily:        Skin    Rashes or ulcers:        Constitutional    Fever or chills:     PHYSICAL EXAM:   Vitals:   07/29/17 0852 07/29/17 0853  BP: (!) 153/67 (!) 144/64  Pulse: 64   Resp: 20   SpO2: 96%   Weight: 158 lb (71.7 kg)   Height: 5\' 6"  (1.676 m)     GENERAL: The patient is a well-nourished male, in no acute distress. The vital signs are documented above. CARDIAC: There is a regular rate and rhythm.  VASCULAR: He has bilateral carotid bruits. On the right side, he has a diminished right femoral pulse.  I cannot palpate popliteal or pedal pulses. On the left side he has a normal femoral pulse.  I cannot palpate popliteal or pedal pulses. He has no significant lower extremity swelling. PULMONARY: There is good air exchange bilaterally without wheezing or rales. ABDOMEN: Soft and non-tender with normal pitched bowel sounds.  MUSCULOSKELETAL: There are no major deformities or cyanosis. NEUROLOGIC: No focal weakness or paresthesias are detected. SKIN: There are no ulcers or rashes noted. PSYCHIATRIC: The patient has a normal affect.  DATA:    DUPLEX OF ILIAC ARTERY RIGHT: We interpreted his duplex of the right iliac system.  He has significantly elevated velocities  in the distal right external iliac artery with a peak systolic velocity of 423 cm/s.  There are also some  elevated velocities in the common iliac artery.  MEDICAL ISSUES:   PERIPHERAL VASCULAR DISEASE: This patient had a right external iliac artery stent in 2011.  He has developed recurrent stenosis within the stent.  Given the risk for progression and occlusion of the artery I have recommended we proceed with arteriography to further evaluate this and see if this is amenable to angioplasty and stenting. I have reviewed with the patient the indications for arteriography. In addition, I have reviewed the potential complications of arteriography including but not limited to: Bleeding, arterial injury, arterial thrombosis, dye action, renal insufficiency, or other unpredictable medical problems. I have explained to the patient that if we find disease amenable to angioplasty we could potentially address this at the same time. I have discussed the potential complications of angioplasty and stenting, including but not limited to: Bleeding, arterial thrombosis, arterial injury, dissection, or the need for surgical intervention.  This procedure is scheduled for 08/21/2017.  ASYMPTOMATIC RIGHT CAROTID STENOSIS: Based on his previous duplex scan he had a 40 to 59% right carotid stenosis.  He will be due for a follow-up duplex scan in October 2019.  He is on aspirin and is on a statin.  [name] [facility] [phone]

## 2017-07-29 NOTE — H&P (View-Only) (Signed)
Patient name: William [name] MRN: [id number] DOB: 1940-09-29 Sex: male  REASON FOR VISIT:   Follow-up of peripheral vascular disease.   HPI:   William [name] is a pleasant 78 y.o. male was seen by the nurse practitioner on 07/14/2017.  He had a right external iliac artery stent in January 2011.  Noninvasive studies during his last visit she had an ABI of 44% on the right and 64% on the left.  He was set up for an aortoiliac duplex and a follow-up visit with me.  The patient describes bilateral calf claudication which is more significant on the right side.  He denies hip or thigh claudication.  Symptoms are brought on by ambulation and relieved with rest.  He denies any rest pain or nonhealing ulcers.   We have also been following him with carotid disease.  Carotid duplex scan on 01/07/2017 showed a 40 to 59% right carotid stenosis with no significant stenosis on the left.  Patient denies any history of stroke, TIAs, expressive or receptive aphasia, or amaurosis fugax.  Past Medical History:  Diagnosis Date  . Carotid artery occlusion   . Colon polyp   . COPD (chronic obstructive pulmonary disease) ([place])   . Coronary artery disease   . Hyperlipidemia   . Hypertension   . 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 Myocardial infarction ([place])   . Peripheral arterial disease (H[facility])     Family History  Problem Relation Age of Onset  . Heart disease Father        Heart Disease before age 14  . Pulmonary embolism Father   . Deep vein thrombosis Father   . Cancer Mother        Sarcoma    SOCIAL HISTORY: He has a remote history of tobacco use. Social History   Tobacco Use  . Smoking status: Former Smoker    Packs/day: 2.00    Years: 30.00    Pack years: 60.00    Types: Cigarettes    Start date: 06/03/1961    Last attempt to quit: 11/17/1991    Years since quitting: 25.7  . Smokeless tobacco: Never Used  Substance Use Topics  . Alcohol use: No    Alcohol/week: 1.2 - 2.4 oz    Types: 2 - 4  Standard drinks or equivalent per week    Comment: used to     Allergies  Allergen Reactions  . Lipitor [Atorvastatin] Other (See Comments)    Leg pain (currently taking and tolerating Crestor)  . Zocor [Simvastatin] Other (See Comments)    Leg pain (currrently taking and tolerating Crestor)    Current Outpatient Medications  Medication Sig Dispense Refill  . amLODipine (NORVASC) 10 MG tablet Take 1 tablet (10 mg total) by mouth daily. 30 tablet 6  . Ascorbic Acid (VITAMIN C) 1000 MG tablet Take 1,000 mg by mouth daily.    Marland Kitchen aspirin EC 81 MG tablet Take 1 tablet (81 mg total) by mouth daily.    . fish oil-omega-3 fatty acids 1000 MG capsule Take 1,000 g by mouth daily.     . folic acid (FOLVITE) 485 MCG tablet Take 400 mcg by mouth daily.    Marland Kitchen gabapentin (NEURONTIN) 300 MG capsule Take 600 mg by mouth every evening.     . Garlic 4627 MG TABS Take 1,250 mg by mouth daily.     Marland Kitchen lisinopril (PRINIVIL,ZESTRIL) 20 MG tablet TAKE 1 TABLET BY MOUTH EVERY DAY 90 tablet 1  . metoprolol tartrate (  LOPRESSOR) 25 MG tablet TAKE 1 TABLET BY MOUTH TWICE A DAY 180 tablet 3  . Multiple Vitamin (MULTIVITAMIN) tablet Take 1 tablet by mouth daily.    . rosuvastatin (CRESTOR) 10 MG tablet Take 1 tablet (10 mg total) by mouth daily. 90 tablet 1  . SPIRIVA HANDIHALER 18 MCG inhalation capsule Place 18 mcg into inhaler and inhale daily.   8  . thiamine (VITAMIN B-1) 100 MG tablet Take 100 mg by mouth daily.    . vitamin E 400 UNIT capsule Take 400 Units by mouth daily.     No current facility-administered medications for this visit.     REVIEW OF SYSTEMS:  [X]  denotes positive finding, [ ]  denotes negative finding Cardiac  Comments:  Chest pain or chest pressure:    Shortness of breath upon exertion:    Short of breath when lying flat:    Irregular heart rhythm:        Vascular    Pain in calf, thigh, or hip brought on by ambulation:    Pain in feet at night that wakes you up from your sleep:       Blood clot in your veins:    Leg swelling:         Pulmonary    Oxygen at home:    Productive cough:     Wheezing:         Neurologic    Sudden weakness in arms or legs:     Sudden numbness in arms or legs:     Sudden onset of difficulty speaking or slurred speech:    Temporary loss of vision in one eye:     Problems with dizziness:         Gastrointestinal    Blood in stool:     Vomited blood:         Genitourinary    Burning when urinating:     Blood in urine:        Psychiatric    Major depression:         Hematologic    Bleeding problems:    Problems with blood clotting too easily:        Skin    Rashes or ulcers:        Constitutional    Fever or chills:     PHYSICAL EXAM:   Vitals:   07/29/17 0852 07/29/17 0853  BP: (!) 153/67 (!) 144/64  Pulse: 64   Resp: 20   SpO2: 96%   Weight: 158 lb (71.7 kg)   Height: 5\' 6"  (1.676 m)     GENERAL: The patient is a well-nourished male, in no acute distress. The vital signs are documented above. CARDIAC: There is a regular rate and rhythm.  VASCULAR: He has bilateral carotid bruits. On the right side, he has a diminished right femoral pulse.  I cannot palpate popliteal or pedal pulses. On the left side he has a normal femoral pulse.  I cannot palpate popliteal or pedal pulses. He has no significant lower extremity swelling. PULMONARY: There is good air exchange bilaterally without wheezing or rales. ABDOMEN: Soft and non-tender with normal pitched bowel sounds.  MUSCULOSKELETAL: There are no major deformities or cyanosis. NEUROLOGIC: No focal weakness or paresthesias are detected. SKIN: There are no ulcers or rashes noted. PSYCHIATRIC: The patient has a normal affect.  DATA:    DUPLEX OF ILIAC ARTERY RIGHT: We interpreted his duplex of the right iliac system.  He has significantly elevated velocities  in the distal right external iliac artery with a peak systolic velocity of 546 cm/s.  There are also some  elevated velocities in the common iliac artery.  MEDICAL ISSUES:   PERIPHERAL VASCULAR DISEASE: This patient had a right external iliac artery stent in 2011.  He has developed recurrent stenosis within the stent.  Given the risk for progression and occlusion of the artery I have recommended we proceed with arteriography to further evaluate this and see if this is amenable to angioplasty and stenting. I have reviewed with the patient the indications for arteriography. In addition, I have reviewed the potential complications of arteriography including but not limited to: Bleeding, arterial injury, arterial thrombosis, dye action, renal insufficiency, or other unpredictable medical problems. I have explained to the patient that if we find disease amenable to angioplasty we could potentially address this at the same time. I have discussed the potential complications of angioplasty and stenting, including but not limited to: Bleeding, arterial thrombosis, arterial injury, dissection, or the need for surgical intervention.  This procedure is scheduled for 08/21/2017.  ASYMPTOMATIC RIGHT CAROTID STENOSIS: Based on his previous duplex scan he had a 40 to 59% right carotid stenosis.  He will be due for a follow-up duplex scan in October 2019.  He is on aspirin and is on a statin.  [name] [facility] [phone]

## 2017-08-03 DIAGNOSIS — I10 Essential (primary) hypertension: Secondary | ICD-10-CM | POA: Diagnosis not present

## 2017-08-03 DIAGNOSIS — E559 Vitamin D deficiency, unspecified: Secondary | ICD-10-CM | POA: Diagnosis not present

## 2017-08-03 DIAGNOSIS — R5383 Other fatigue: Secondary | ICD-10-CM | POA: Diagnosis not present

## 2017-08-03 DIAGNOSIS — E785 Hyperlipidemia, unspecified: Secondary | ICD-10-CM | POA: Diagnosis not present

## 2017-08-21 ENCOUNTER — Ambulatory Visit (HOSPITAL_COMMUNITY)
Admission: RE | Admit: 2017-08-21 | Discharge: 2017-08-21 | Disposition: A | Discharge status: Home / Self Care | Payer: Medicare Other | Source: Ambulatory Visit | Attending: Vascular Surgery | Admitting: Vascular Surgery

## 2017-08-21 ENCOUNTER — Encounter (HOSPITAL_COMMUNITY)
Admission: RE | Disposition: A | Discharge status: Home / Self Care | Payer: Self-pay | Source: Ambulatory Visit | Attending: Vascular Surgery

## 2017-08-21 ENCOUNTER — Encounter (HOSPITAL_COMMUNITY): Payer: Self-pay | Admitting: Vascular Surgery

## 2017-08-21 DIAGNOSIS — I700 Atherosclerosis of aorta: Secondary | ICD-10-CM | POA: Insufficient documentation

## 2017-08-21 DIAGNOSIS — Z8249 Family history of ischemic heart disease and other diseases of the circulatory system: Secondary | ICD-10-CM | POA: Insufficient documentation

## 2017-08-21 DIAGNOSIS — J449 Chronic obstructive pulmonary disease, unspecified: Secondary | ICD-10-CM | POA: Insufficient documentation

## 2017-08-21 DIAGNOSIS — Z87891 Personal history of nicotine dependence: Secondary | ICD-10-CM | POA: Insufficient documentation

## 2017-08-21 DIAGNOSIS — I701 Atherosclerosis of renal artery: Secondary | ICD-10-CM | POA: Insufficient documentation

## 2017-08-21 DIAGNOSIS — I10 Essential (primary) hypertension: Secondary | ICD-10-CM | POA: Insufficient documentation

## 2017-08-21 DIAGNOSIS — I771 Stricture of artery: Secondary | ICD-10-CM | POA: Insufficient documentation

## 2017-08-21 DIAGNOSIS — I252 Old myocardial infarction: Secondary | ICD-10-CM | POA: Diagnosis not present

## 2017-08-21 DIAGNOSIS — I739 Peripheral vascular disease, unspecified: Secondary | ICD-10-CM | POA: Diagnosis present

## 2017-08-21 DIAGNOSIS — I70213 Atherosclerosis of native arteries of extremities with intermittent claudication, bilateral legs: Secondary | ICD-10-CM | POA: Diagnosis not present

## 2017-08-21 DIAGNOSIS — Z7982 Long term (current) use of aspirin: Secondary | ICD-10-CM | POA: Insufficient documentation

## 2017-08-21 DIAGNOSIS — E785 Hyperlipidemia, unspecified: Secondary | ICD-10-CM | POA: Insufficient documentation

## 2017-08-21 DIAGNOSIS — I6523 Occlusion and stenosis of bilateral carotid arteries: Secondary | ICD-10-CM | POA: Diagnosis not present

## 2017-08-21 DIAGNOSIS — I2510 Atherosclerotic heart disease of native coronary artery without angina pectoris: Secondary | ICD-10-CM | POA: Diagnosis not present

## 2017-08-21 HISTORY — PX: ABDOMINAL AORTOGRAM W/LOWER EXTREMITY: CATH118223

## 2017-08-21 LAB — POCT I-STAT, CHEM 8
BUN: 27 mg/dL — ABNORMAL HIGH (ref 6–20)
Calcium, Ion: 1.09 mmol/L — ABNORMAL LOW (ref 1.15–1.40)
Chloride: 104 mmol/L (ref 101–111)
Creatinine, Ser: 1 mg/dL (ref 0.61–1.24)
Glucose, Bld: 105 mg/dL — ABNORMAL HIGH (ref 65–99)
HCT: 47 % (ref 39.0–52.0)
Hemoglobin: 16 g/dL (ref 13.0–17.0)
Potassium: 4.1 mmol/L (ref 3.5–5.1)
Sodium: 139 mmol/L (ref 135–145)
TCO2: 24 mmol/L (ref 22–32)

## 2017-08-21 SURGERY — ABDOMINAL AORTOGRAM W/LOWER EXTREMITY
Anesthesia: LOCAL

## 2017-08-21 MED ORDER — HEPARIN (PORCINE) IN NACL 2-0.9 UNITS/ML
INTRAMUSCULAR | Status: AC | PRN
  Administered 2017-08-21: 500 mL
  Administered 2017-08-21: 500 mL via INTRA_ARTERIAL

## 2017-08-21 MED ORDER — SODIUM CHLORIDE 0.9% FLUSH: 3.0000 mL | INTRAVENOUS | Status: DC | PRN

## 2017-08-21 MED ORDER — SODIUM CHLORIDE 0.9 % IV SOLN: 250.0000 mL | INTRAVENOUS | Status: DC | PRN

## 2017-08-21 MED ORDER — LABETALOL HCL 5 MG/ML IV SOLN: 10.0000 mg | INTRAVENOUS | Status: DC | PRN

## 2017-08-21 MED ORDER — LIDOCAINE HCL (PF) 1 % IJ SOLN
INTRAMUSCULAR | Status: AC
  Filled 2017-08-21: qty 30

## 2017-08-21 MED ORDER — SODIUM CHLORIDE 0.9 % IV SOLN
INTRAVENOUS | Status: DC
  Administered 2017-08-21: 06:00:00 via INTRAVENOUS

## 2017-08-21 MED ORDER — ONDANSETRON HCL 4 MG/2ML IJ SOLN: 4.0000 mg | Freq: Four times a day (QID) | INTRAMUSCULAR | Status: DC | PRN

## 2017-08-21 MED ORDER — FENTANYL CITRATE (PF) 100 MCG/2ML IJ SOLN
INTRAMUSCULAR | Status: AC
  Filled 2017-08-21: qty 2

## 2017-08-21 MED ORDER — FENTANYL CITRATE (PF) 100 MCG/2ML IJ SOLN
INTRAMUSCULAR | Status: DC | PRN
  Administered 2017-08-21: 50 ug via INTRAVENOUS

## 2017-08-21 MED ORDER — MIDAZOLAM HCL 2 MG/2ML IJ SOLN
INTRAMUSCULAR | Status: DC | PRN
  Administered 2017-08-21: 1 mg via INTRAVENOUS

## 2017-08-21 MED ORDER — SODIUM CHLORIDE 0.9 % WEIGHT BASED INFUSION: 1.0000 mL/kg/h | INTRAVENOUS | Status: DC

## 2017-08-21 MED ORDER — LIDOCAINE HCL (PF) 1 % IJ SOLN
INTRAMUSCULAR | Status: DC | PRN
  Administered 2017-08-21: 15 mL

## 2017-08-21 MED ORDER — IODIXANOL 320 MG/ML IV SOLN
INTRAVENOUS | Status: DC | PRN
  Administered 2017-08-21: 157 mL via INTRAVENOUS

## 2017-08-21 MED ORDER — SODIUM CHLORIDE 0.9% FLUSH: 3.0000 mL | Freq: Two times a day (BID) | INTRAVENOUS | Status: DC

## 2017-08-21 MED ORDER — MIDAZOLAM HCL 2 MG/2ML IJ SOLN
INTRAMUSCULAR | Status: AC
  Filled 2017-08-21: qty 2

## 2017-08-21 MED ORDER — HEPARIN (PORCINE) IN NACL 1000-0.9 UT/500ML-% IV SOLN
INTRAVENOUS | Status: AC
  Filled 2017-08-21: qty 500

## 2017-08-21 MED ORDER — LIDOCAINE HCL (PF) 1 % IJ SOLN
INTRAMUSCULAR | Status: DC | PRN
  Administered 2017-08-21: 15 mL via INTRADERMAL

## 2017-08-21 MED ORDER — HYDRALAZINE HCL 20 MG/ML IJ SOLN: 5.0000 mg | INTRAMUSCULAR | Status: DC | PRN

## 2017-08-21 MED ORDER — ACETAMINOPHEN 325 MG PO TABS: 650.0000 mg | ORAL_TABLET | ORAL | Status: DC | PRN

## 2017-08-21 SURGICAL SUPPLY — 13 items
CATH ANGIO 5F PIGTAIL 65CM (CATHETERS) ×1 IMPLANT
CATH SOFT-VU 4F 65 STRAIGHT (CATHETERS) IMPLANT
CATH SOFT-VU STRAIGHT 4F 65CM (CATHETERS) ×2
COVER PRB 48X5XTLSCP FOLD TPE (BAG) IMPLANT
COVER PROBE 5X48 (BAG) ×2
KIT MICROPUNCTURE NIT STIFF (SHEATH) ×1 IMPLANT
KIT PV (KITS) ×2 IMPLANT
SHEATH AVANTI 11CM 5FR (SHEATH) ×1 IMPLANT
SYR MEDRAD MARK V 150ML (SYRINGE) ×2 IMPLANT
TRANSDUCER W/STOPCOCK (MISCELLANEOUS) ×2 IMPLANT
TRAY PV CATH (CUSTOM PROCEDURE TRAY) ×2 IMPLANT
WIRE HITORQ VERSACORE ST 145CM (WIRE) ×1 IMPLANT
WIRE J 3MM .035X145CM (WIRE) ×1 IMPLANT

## 2017-08-21 NOTE — Progress Notes (Signed)
Up and walked and tolerated well; right groin stable no bleeding or hematoma 

## 2017-08-21 NOTE — Progress Notes (Signed)
Site area: left groin fa sheath Site Prior to Removal:  Level 0 Pressure Applied For: 20 minutes Manual:   yes Patient Status During Pull:  stable Post Pull Site:  Level 0 Post Pull Instructions Given:  yes Post Pull Pulses Present: left pt dopplered Dressing Applied:  Gauze and tegaderm Bedrest begins @ 0900 Comments:

## 2017-08-21 NOTE — Interval H&P Note (Signed)
History and Physical Interval Note:  08/21/2017 7:17 AM  William [name]  has presented today for surgery, with the diagnosis of claudication  The various methods of treatment have been discussed with the patient and family. After consideration of risks, benefits and other options for treatment, the patient has consented to  Procedure(s): ABDOMINAL AORTOGRAM W/LOWER EXTREMITY (N/A) as a surgical intervention .  The patient's history has been reviewed, patient examined, no change in status, stable for surgery.  I have reviewed the patient's chart and labs.  Questions were answered to the patient's satisfaction.     [name]

## 2017-08-21 NOTE — Op Note (Signed)
PATIENT: William [name]      MRN: [id number] DOB: [birth year]    DATE OF PROCEDURE: 08/21/2017  INDICATIONS:    William [name] is a 77 y.o. male who was seen on 07/29/2017 with bilateral lower extremity claudication which is worse on the right side.  He had a previous right external iliac artery stent in 2011 and there was some suggestion by duplex that there was some stenosis near the stent.  Comes in for arteriography.  He has no history of rest pain or nonhealing ulcers.  PROCEDURE:    1.  Conscious sedation 2.  Ultrasound-guided access to the left common femoral artery 3. Aortogram with bilateral iliac arteriogram and bilateral lower extremity runoff 4.  Arch aortogram  SURGEON: Judeth Cornfield. Scot Dock, MD, FACS  ANESTHESIA: Local with sedation  EBL: Minimal  TECHNIQUE: The patient was brought to the peripheral vascular lab and was sedated. The period of conscious sedation was 37 minutes.  During that time period, I was present face-to-face 100% of the time.  The patient was administered 1 mg of Versed and 50 mcg of fentanyl. The patient's heart rate, blood pressure, and oxygen saturation were monitored by the nurse continuously during the procedure.  Both groins were prepped and draped in the usual sterile fashion.  Under ultrasound guidance, after the skin was anesthetized, I cannulated the right common femoral artery with a micropuncture needle however the wire would not thread.  I looked at the artery higher but the artery appeared to be occluded therefore I elected to go to the left side.  On the left side, after the skin was anesthetized, under ultrasound guidance, the left common femoral artery was cannulated with a micropuncture needle.  A micropuncture sheath was introduced over a wire.  This was exchanged for a 5 [place] sheath over a Bentson wire.  By ultrasound the femoral artery was patent. There was some calcific disease in the left common femoral artery.  A real-time image was  obtained and placed in the chart.   The micropuncture sheath was exchanged for a 5 French sheath over a Bentson wire.  A pigtail catheter was positioned at the L1 vertebral body and flush aortogram obtained.  I then advanced the catheter over a wire into the aortic arch and arch aortogram was obtained in order to evaluate the subclavian arteries in case the patient ultimately needed an axillobifemoral bypass. The catheter was then brought above the bifurcation and oblique iliac projections were obtained. Next bilateral lower extremity runoff films were obtained.  At the completion of the procedure the catheter was removed over a wire.  The patient was transferred to the holding area for removal of the sheath.  No immediate complications were noted.  FINDINGS:   1. 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 The right subclavian artery appears to be patent. 2.  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 3.  There is diffuse calcific disease of the aorta and iliac arteries.  There is some slight ectasia of the distal aorta but no focal stenosis within the aorta. 4.  On the right side, which is the more symptomatic side, there is some plaque in the right common iliac artery which is somewhat bulky and calcified.  The hypogastric artery is patent.  The stent in the right external iliac artery is patent.  Below that the right common femoral artery is occluded.  There is reconstitution of the deep femoral artery.  The proximal superficial femoral artery is patent with moderate disease.  The superficial femoral artery below that is occluded with reconstitution of the posterior tibial artery only.  The anterior tibial and peroneal arteries are occluded on the right. 5.  On the left side, the common femoral and deep femoral artery are patent.  The superficial femoral artery is occluded.  There is reconstitution of the distal posterior tibial artery on the left.  The  anterior tibial and peroneal arteries are occluded.  The popliteal artery in the left is occluded.  CLINICAL NOTE: The patient has claudication with no rest pain or nonhealing ulcers.  He is 77 years old.  He has diffuse multilevel arterial occlusive disease.  I would favor nonoperative treatment as there really no good endovascular options.  If his symptoms progress or he develops critical limb ischemia he could be considered for a right axillobifemoral bypass graft with femoral endarterectomy on the right.  Given his diffuse disease I think surgery would be associated with increased risk.  I plan on seeing him back in October.  He is due to come in October for carotid duplex scan.  He has a known 20 to 59% left carotid stenosis.  William Mayo, MD, FACS [facility]  DATE OF DICTATION:   08/21/2017

## 2017-08-21 NOTE — Discharge Instructions (Signed)

## 2017-08-25 ENCOUNTER — Telehealth: Payer: Self-pay | Admitting: Vascular Surgery

## 2017-08-25 MED FILL — Heparin Sod (Porcine)-NaCl IV Soln 1000 Unit/500ML-0.9%: INTRAVENOUS | Qty: 1000 | Status: AC

## 2017-08-25 NOTE — Telephone Encounter (Signed)
sch appt lvm 02/03/18 9am Carotid 10am ABI 1015 am F/u MD s/p 69mo f/u +carotid+MD per dc sheet

## 2017-09-10 ENCOUNTER — Other Ambulatory Visit: Payer: Self-pay

## 2017-09-10 DIAGNOSIS — I6523 Occlusion and stenosis of bilateral carotid arteries: Secondary | ICD-10-CM

## 2017-09-11 ENCOUNTER — Other Ambulatory Visit: Payer: Self-pay

## 2017-09-11 DIAGNOSIS — I6523 Occlusion and stenosis of bilateral carotid arteries: Secondary | ICD-10-CM

## 2017-09-11 DIAGNOSIS — I779 Disorder of arteries and arterioles, unspecified: Secondary | ICD-10-CM

## 2017-11-03 DIAGNOSIS — E785 Hyperlipidemia, unspecified: Secondary | ICD-10-CM | POA: Diagnosis not present

## 2017-11-03 DIAGNOSIS — I10 Essential (primary) hypertension: Secondary | ICD-10-CM | POA: Diagnosis not present

## 2017-11-03 DIAGNOSIS — R5383 Other fatigue: Secondary | ICD-10-CM | POA: Diagnosis not present

## 2017-11-23 ENCOUNTER — Other Ambulatory Visit: Payer: Self-pay | Admitting: Cardiovascular Disease

## 2017-12-16 ENCOUNTER — Encounter: Payer: Self-pay | Admitting: *Deleted

## 2017-12-16 ENCOUNTER — Encounter: Payer: Self-pay | Admitting: Cardiovascular Disease

## 2017-12-16 ENCOUNTER — Ambulatory Visit (INDEPENDENT_AMBULATORY_CARE_PROVIDER_SITE_OTHER): Payer: Medicare Other | Admitting: Cardiovascular Disease

## 2017-12-16 VITALS — BP 160/62 | HR 67 | Ht 66.0 in | Wt 153.0 lb

## 2017-12-16 DIAGNOSIS — I10 Essential (primary) hypertension: Secondary | ICD-10-CM

## 2017-12-16 DIAGNOSIS — E785 Hyperlipidemia, unspecified: Secondary | ICD-10-CM

## 2017-12-16 DIAGNOSIS — Z95828 Presence of other vascular implants and grafts: Secondary | ICD-10-CM | POA: Diagnosis not present

## 2017-12-16 DIAGNOSIS — I6523 Occlusion and stenosis of bilateral carotid arteries: Secondary | ICD-10-CM | POA: Diagnosis not present

## 2017-12-16 DIAGNOSIS — I739 Peripheral vascular disease, unspecified: Secondary | ICD-10-CM | POA: Diagnosis not present

## 2017-12-16 DIAGNOSIS — I25708 Atherosclerosis of coronary artery bypass graft(s), unspecified, with other forms of angina pectoris: Secondary | ICD-10-CM

## 2017-12-16 DIAGNOSIS — R5383 Other fatigue: Secondary | ICD-10-CM | POA: Diagnosis not present

## 2017-12-16 DIAGNOSIS — Z87891 Personal history of nicotine dependence: Secondary | ICD-10-CM

## 2017-12-16 DIAGNOSIS — Z951 Presence of aortocoronary bypass graft: Secondary | ICD-10-CM | POA: Diagnosis not present

## 2017-12-16 DIAGNOSIS — I779 Disorder of arteries and arterioles, unspecified: Secondary | ICD-10-CM

## 2017-12-16 NOTE — Progress Notes (Signed)
SUBJECTIVE: William [name] presents for follow up of CAD (2-v CABG).  He was hospitalized in February 2017 with a non-STEMI. Coronary angiography demonstrated critical left main obstruction and thus underwent a LIMA to the LAD and saphenous vein graft to the first obtuse marginal branch. Additional past medical history includes hypertension, hyperlipidemia, COPD, tobacco abuse, and peripheral vascular disease with a history of right external iliac artery stentingand bilateral carotid artery stenosis.  Echocardiogram on 05/19/15 demonstrated normal left ventricular systolic function with ischemic wall motion abnormalities, EF 50-55%, mild LVH, grade 1 diastolic dysfunction, and mild to moderate aortic regurgitation.  He underwent aortogram with bilateral iliac arteriogram and bilateral lower extremity runoff on 08/21/2017 by Dr. Scot Dock.  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  There was 50% left renal artery stenosis.  Other details are noted and reviewed.  He checks his blood pressure at home on a daily basis and it runs from 120-130/60 range.  He continues to feel fatigued and his PCP recently increased the dose of synthetic thyroid hormone.  He denies chest pain and shortness of breath.  He has received testosterone supplementation as well but did not notice a big difference in energy levels.  He does have constipation.  He continues to eat healthy and exercises 6 days/week at the [facility].    Review of Systems: As per "subjective", otherwise negative.  Allergies  Allergen Reactions  . Lipitor [Atorvastatin] Other (See Comments)    Leg pain (currently taking and tolerating Crestor)  . Zocor [Simvastatin] Other (See Comments)    Leg pain (currrently taking and tolerating Crestor)    Current Outpatient Medications  Medication Sig Dispense Refill  . amLODipine (NORVASC) 10 MG tablet Take 1 tablet (10 mg total) by mouth daily. (Patient taking differently: Take 10 mg by mouth daily after  supper. ) 30 tablet 6  . Ascorbic Acid (VITAMIN C) 1000 MG tablet Take 1,000 mg by mouth daily.    Marland Kitchen aspirin EC 81 MG tablet Take 1 tablet (81 mg total) by mouth daily.    . fish oil-omega-3 fatty acids 1000 MG capsule Take 1,000 g by mouth daily.     . folic acid (FOLVITE) 322 MCG tablet Take 400 mcg by mouth daily.    Marland Kitchen gabapentin (NEURONTIN) 300 MG capsule Take 300 mg by mouth at bedtime.     . Garlic 0254 MG TABS Take 1,250 mg by mouth daily.     Marland Kitchen ibuprofen (ADVIL,MOTRIN) 200 MG tablet Take 400 mg by mouth every 8 (eight) hours as needed (for pain/headaches.).    Marland Kitchen lisinopril (PRINIVIL,ZESTRIL) 20 MG tablet TAKE 1 TABLET BY MOUTH EVERY DAY (Patient taking differently: TAKE 1 TABLET BY MOUTH EVERY DAY IN THE EVENING.) 90 tablet 1  . metoprolol tartrate (LOPRESSOR) 25 MG tablet TAKE 1 TABLET BY MOUTH TWICE A DAY 180 tablet 3  . Multiple Vitamin (MULTIVITAMIN) tablet Take 1 tablet by mouth daily.    . rosuvastatin (CRESTOR) 10 MG tablet TAKE 1 TABLET BY MOUTH EVERY DAY 90 tablet 1  . SPIRIVA HANDIHALER 18 MCG inhalation capsule Place 18 mcg into inhaler and inhale daily.   8  . thiamine (VITAMIN B-1) 100 MG tablet Take 100 mg by mouth every evening.     . thyroid (ARMOUR) 60 MG tablet Take 60 mg by mouth daily before breakfast.    . vitamin E 400 UNIT capsule Take 400 Units by mouth daily.     No current facility-administered medications for this visit.  Past Medical History:  Diagnosis Date  . Carotid artery occlusion   . Colon polyp   . COPD (chronic obstructive pulmonary disease) ([place])   . Coronary artery disease   . Hyperlipidemia   . Hypertension   . 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 Myocardial infarction ([place])   . Peripheral arterial disease [facility])     Past Surgical History:  Procedure Laterality Date  . ABDOMINAL AORTOGRAM W/LOWER EXTREMITY N/A 08/21/2017   Procedure: ABDOMINAL AORTOGRAM W/LOWER EXTREMITY;  Surgeon: Angelia Mould, MD;  Location: [place] [facility];  Service: Cardiovascular;  Laterality: N/A;  . Bilateral renal  artery stenoses  04/23/2009  . CARDIAC CATHETERIZATION N/A 05/21/2015   Procedure: Left Heart Cath and Coronary Angiography;  Surgeon: Lorretta Harp, MD;  Location: [place] [facility];  Service: Cardiovascular;  Laterality: N/A;  . CORONARY ARTERY BYPASS GRAFT N/A 05/22/2015   Procedure: CORONARY ARTERY BYPASS GRAFTING (CABG) LIMA-LAD and SVG-OM EVH RIGHT THIGH GREATER SAPHENOUS VEIN;  Surgeon: [name], MD;  Location: [place];  Service: Open Heart Surgery;  Laterality: N/A;  . HIATAL HERNIA REPAIR    . Percutaneous translumnial angioplasty  04/23/2009   Catheterization of Lefst external iliac artery with Left lower extremity runoff  . TEE WITHOUT CARDIOVERSION N/A 05/22/2015   Procedure: TRANSESOPHAGEAL ECHOCARDIOGRAM (TEE);  Surgeon: [name], MD;  Location: [place];  Service: Open Heart Surgery;  Laterality: N/A;    Social History   Socioeconomic History  . Marital status: Married    Spouse name: Not on file  . Number of children: Not on file  . Years of education: Not on file  . Highest education level: Not on file  Occupational History  . Not on file  Social Needs  . Financial resource strain: Not on file  . Food insecurity:    Worry: Not on file    Inability: Not on file  . Transportation needs:    Medical: Not on file    Non-medical: Not on file  Tobacco Use  . Smoking status: Former Smoker    Packs/day: 2.00    Years: 30.00    Pack years: 60.00    Types: Cigarettes    Start date: 06/03/1961    Last attempt to quit: 11/17/1991    Years since quitting: 26.0  . Smokeless tobacco: Never Used  Substance and Sexual Activity  . Alcohol use: No    Alcohol/week: 2.0 - 4.0 standard drinks    Types: 2 - 4 Standard drinks or equivalent per week    Comment: used to   . Drug use: No  . Sexual activity: Not on file  Lifestyle  . Physical activity:    Days per week: Not on file    Minutes per  session: Not on file  . Stress: Not on file  Relationships  . Social connections:    Talks on phone: Not on file    Gets together: Not on file    Attends religious service: Not on file    Active member of club or organization: Not on file    Attends meetings of clubs or organizations: Not on file    Relationship status: Not on file  . Intimate partner violence:    Fear of current or ex partner: Not on file    Emotionally abused: Not on file    Physically abused: Not on file    Forced sexual activity: Not on file  Other Topics Concern  . Not  on file  Social History Narrative  . Not on file     Vitals:   12/16/17 0847  BP: (!) 160/62  Pulse: 67  SpO2: 96%  Weight: 153 lb (69.4 kg)  Height: 5\' 6"  (1.676 m)    Wt Readings from Last 3 Encounters:  12/16/17 153 lb (69.4 kg)  08/21/17 160 lb (72.6 kg)  07/29/17 158 lb (71.7 kg)     PHYSICAL EXAM General: NAD HEENT: Normal. Neck: No JVD, no thyromegaly. Lungs: Clear to auscultation bilaterally with normal respiratory effort. CV: Regular rate and rhythm, normal S1/S2, no S3/S4, no murmur. No pretibial or periankle edema.  Bilateral carotid bruits.  Abdomen: Soft, nontender, no distention.  Neurologic: Alert and oriented.  Psych: Normal affect. Skin: Normal. Musculoskeletal: No gross deformities.    ECG: Reviewed above under Subjective   Labs: Lab Results  Component Value Date/Time   K 4.1 08/21/2017 06:04 AM   BUN 27 (H) 08/21/2017 06:04 AM   CREATININE 1.00 08/21/2017 06:04 AM   ALT 23 05/19/2015 02:24 AM   TSH 2.894 05/19/2015 02:24 AM   HGB 16.0 08/21/2017 06:04 AM     Lipids: Lab Results  Component Value Date/Time   LDLCALC 89 05/19/2015 02:24 AM   CHOL 137 05/19/2015 02:24 AM   TRIG 74 05/19/2015 02:24 AM   HDL 33 (L) 05/19/2015 02:24 AM       ASSESSMENT AND PLAN:  1. CAD s/p 2-vessel CABG with NSTEMI: Symptomatically stable.Continue ASA,metoprolol, ACEI, and Crestor.  He was not able to  tolerate higher dose of statin in the past.  2. Essential HTN: Blood pressure is significantly elevated.  However, it is routinely normal at home running in the 120-130/60 range.  No changes to therapy.  3. Hyperlipidemia: Continue Crestor.  He was not able to tolerate higher dose of statin in the past and has had side effects from Lipitor and Zocor.  I will obtain a copy of most recent lipids from his PCP.  4. PVD/Bilateral carotid artery stenosis: Follows with vascular surgery.  Continue aspirin and statin.  He is due for carotid Dopplers and ABIs on 02/03/2018.  5.  Fatigue: This may be due to hypothyroidism.  The dose of synthetic thyroid hormone was recently increased by his PCP.  He does have a history of snoring and I would consider a sleep study but the patient prefers to hold off for now.  He denies morning headaches and daytime somnolence.   Disposition: Follow up 6 months   Kate Sable, M.D., F.A.C.C.

## 2017-12-16 NOTE — Patient Instructions (Signed)

## 2017-12-27 ENCOUNTER — Other Ambulatory Visit: Payer: Self-pay | Admitting: Cardiovascular Disease

## 2018-01-05 DIAGNOSIS — M25511 Pain in right shoulder: Secondary | ICD-10-CM | POA: Diagnosis not present

## 2018-01-05 DIAGNOSIS — H532 Diplopia: Secondary | ICD-10-CM | POA: Diagnosis not present

## 2018-01-05 DIAGNOSIS — R5383 Other fatigue: Secondary | ICD-10-CM | POA: Diagnosis not present

## 2018-01-05 DIAGNOSIS — R0683 Snoring: Secondary | ICD-10-CM | POA: Diagnosis not present

## 2018-01-19 ENCOUNTER — Other Ambulatory Visit (HOSPITAL_COMMUNITY): Payer: Self-pay | Admitting: Internal Medicine

## 2018-01-19 ENCOUNTER — Other Ambulatory Visit: Payer: Self-pay | Admitting: Cardiovascular Disease

## 2018-01-19 ENCOUNTER — Ambulatory Visit (HOSPITAL_COMMUNITY)
Admission: RE | Admit: 2018-01-19 | Discharge: 2018-01-19 | Disposition: A | Discharge status: Home / Self Care | Payer: Medicare Other | Source: Ambulatory Visit | Attending: Internal Medicine | Admitting: Internal Medicine

## 2018-01-19 DIAGNOSIS — J449 Chronic obstructive pulmonary disease, unspecified: Secondary | ICD-10-CM | POA: Diagnosis not present

## 2018-01-19 DIAGNOSIS — S60512D Abrasion of left hand, subsequent encounter: Secondary | ICD-10-CM | POA: Diagnosis not present

## 2018-01-19 DIAGNOSIS — R0609 Other forms of dyspnea: Secondary | ICD-10-CM | POA: Diagnosis not present

## 2018-01-19 DIAGNOSIS — I517 Cardiomegaly: Secondary | ICD-10-CM | POA: Diagnosis not present

## 2018-01-19 DIAGNOSIS — R918 Other nonspecific abnormal finding of lung field: Secondary | ICD-10-CM | POA: Insufficient documentation

## 2018-01-19 DIAGNOSIS — I10 Essential (primary) hypertension: Secondary | ICD-10-CM | POA: Diagnosis not present

## 2018-01-28 DIAGNOSIS — Z961 Presence of intraocular lens: Secondary | ICD-10-CM | POA: Diagnosis not present

## 2018-01-28 DIAGNOSIS — H11041 Peripheral pterygium, stationary, right eye: Secondary | ICD-10-CM | POA: Diagnosis not present

## 2018-02-03 ENCOUNTER — Ambulatory Visit (INDEPENDENT_AMBULATORY_CARE_PROVIDER_SITE_OTHER): Payer: Medicare Other | Admitting: Vascular Surgery

## 2018-02-03 ENCOUNTER — Other Ambulatory Visit: Payer: Self-pay

## 2018-02-03 ENCOUNTER — Ambulatory Visit: Payer: Medicare Other | Admitting: Vascular Surgery

## 2018-02-03 ENCOUNTER — Ambulatory Visit (HOSPITAL_COMMUNITY)
Admission: RE | Admit: 2018-02-03 | Discharge: 2018-02-03 | Disposition: A | Discharge status: Home / Self Care | Payer: Medicare Other | Source: Ambulatory Visit | Attending: Vascular Surgery | Admitting: Vascular Surgery

## 2018-02-03 ENCOUNTER — Ambulatory Visit (INDEPENDENT_AMBULATORY_CARE_PROVIDER_SITE_OTHER)
Admission: RE | Admit: 2018-02-03 | Discharge: 2018-02-03 | Disposition: A | Discharge status: Home / Self Care | Payer: Medicare Other | Source: Ambulatory Visit | Attending: Vascular Surgery | Admitting: Vascular Surgery

## 2018-02-03 ENCOUNTER — Encounter: Payer: Self-pay | Admitting: Vascular Surgery

## 2018-02-03 VITALS — BP 192/75 | HR 62 | Temp 97.0°F | Resp 16 | Ht 66.0 in | Wt 152.0 lb

## 2018-02-03 DIAGNOSIS — I779 Disorder of arteries and arterioles, unspecified: Secondary | ICD-10-CM

## 2018-02-03 DIAGNOSIS — I6523 Occlusion and stenosis of bilateral carotid arteries: Secondary | ICD-10-CM | POA: Diagnosis not present

## 2018-02-03 NOTE — Progress Notes (Signed)
Patient name: William [name] MRN: [id number] DOB: [birth year] Sex: male  REASON FOR VISIT:   Follow-up of right carotid stenosis.  HPI:   William [name] is a pleasant 77 y.o. male who we have been following with carotid disease.  Previous duplex scan showed a 40 to 59% right carotid stenosis and the patient was due for a follow-up study at this point.  He was on aspirin and on a statin.  In addition has had a previous right external iliac artery stent.  This was in 2011.  Duplex scan had suggested some recurrent stenosis on the right and he underwent an arteriogram on 08/21/2017.  There was some plaque in the right common iliac artery which was bulky and calcific.  The stent in the right external iliac artery was patent.  The common femoral artery was occluded.  There was reconstitution of the deep femoral artery and the proximal superficial femoral artery.  The superficial femoral artery below that was occluded with reconstitution of the posterior tibial artery.  At that point the patient had claudication with no rest pain or critical limb ischemia.  He had diffuse multilevel arterial occlusive disease.  I favored a nonoperative approach as there were really no good endovascular options.  Since I saw him last, his only complaint of double vision which is a fairly new symptom.  He was seen by his eye doctor and no specific problems were identified.  He is scheduled to see a neurologist.  He denies any history of stroke, TIAs, expressive or receptive aphasia, or amaurosis fugax.  He does have claudication in the right leg and left leg approximately 100 yards.  The symptoms are more significant on the right side and mostly involve the distal aspect of his right leg below his calf.  He denies any history of rest pain or nonhealing ulcers.  He quit smoking 20 years ago.  He tries to stay as active as he can and does a fair amount of walking.  He is on aspirin and is on a statin.  Past Medical History:    Diagnosis Date  . Carotid artery occlusion   . Colon polyp   . COPD (chronic obstructive pulmonary disease) ([place])   . Coronary artery disease   . Hyperlipidemia   . Hypertension   . 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 Myocardial infarction ([place])   . Peripheral arterial disease ([facility])     Family History  Problem Relation Age of Onset  . Heart disease Father        Heart Disease before age 20  . Pulmonary embolism Father   . Deep vein thrombosis Father   . Cancer Mother        Sarcoma    SOCIAL HISTORY: Social History   Tobacco Use  . Smoking status: Former Smoker    Packs/day: 2.00    Years: 30.00    Pack years: 60.00    Types: Cigarettes    Start date: 06/03/1961    Last attempt to quit: 11/17/1991    Years since quitting: 26.2  . Smokeless tobacco: Former Systems developer    Types: Snuff  Substance Use Topics  . Alcohol use: No    Alcohol/week: 2.0 - 4.0 standard drinks    Types: 2 - 4 Standard drinks or equivalent per week    Comment: used to     Allergies  Allergen Reactions  . Lipitor [Atorvastatin] Other (See Comments)    Leg pain (currently taking and tolerating  Crestor)  . Zocor [Simvastatin] Other (See Comments)    Leg pain (currrently taking and tolerating Crestor)    Current Outpatient Medications  Medication Sig Dispense Refill  . amLODipine (NORVASC) 10 MG tablet Take 1 tablet (10 mg total) by mouth daily. (Patient taking differently: Take 10 mg by mouth daily after supper. ) 30 tablet 6  . Ascorbic Acid (VITAMIN C) 1000 MG tablet Take 1,000 mg by mouth daily.    Marland Kitchen aspirin EC 81 MG tablet Take 1 tablet (81 mg total) by mouth daily.    . fish oil-omega-3 fatty acids 1000 MG capsule Take 1,000 g by mouth daily.     . folic acid (FOLVITE) 161 MCG tablet Take 400 mcg by mouth daily.    Marland Kitchen gabapentin (NEURONTIN) 300 MG capsule Take 300 mg by mouth at bedtime.     . Garlic 0960 MG TABS Take 1,250 mg by mouth daily.     Marland Kitchen ibuprofen (ADVIL,MOTRIN) 200 MG tablet Take 400  mg by mouth every 8 (eight) hours as needed (for pain/headaches.).    Marland Kitchen lisinopril (PRINIVIL,ZESTRIL) 20 MG tablet TAKE 1 TABLET BY MOUTH EVERY DAY 90 tablet 1  . metoprolol tartrate (LOPRESSOR) 25 MG tablet TAKE 1 TABLET BY MOUTH TWICE A DAY 180 tablet 2  . Multiple Vitamin (MULTIVITAMIN) tablet Take 1 tablet by mouth daily.    . rosuvastatin (CRESTOR) 10 MG tablet TAKE 1 TABLET BY MOUTH EVERY DAY 90 tablet 1  . SPIRIVA HANDIHALER 18 MCG inhalation capsule Place 18 mcg into inhaler and inhale daily.   8  . thiamine (VITAMIN B-1) 100 MG tablet Take 100 mg by mouth every evening.     . thyroid (ARMOUR) 60 MG tablet Take 60 mg by mouth daily before breakfast.    . vitamin E 400 UNIT capsule Take 400 Units by mouth daily.     No current facility-administered medications for this visit.     REVIEW OF SYSTEMS:  [X]  denotes positive finding, [ ]  denotes negative finding Cardiac  Comments:  Chest pain or chest pressure:    Shortness of breath upon exertion: x   Short of breath when lying flat:    Irregular heart rhythm:        Vascular    Pain in calf, thigh, or hip brought on by ambulation: x   Pain in feet at night that wakes you up from your sleep:     Blood clot in your veins:    Leg swelling:         Pulmonary    Oxygen at home:    Productive cough:     Wheezing:         Neurologic    Sudden weakness in arms or legs:     Sudden numbness in arms or legs:     Sudden onset of difficulty speaking or slurred speech:    Temporary loss of vision in one eye:     Problems with dizziness:         Gastrointestinal    Blood in stool:     Vomited blood:         Genitourinary    Burning when urinating:     Blood in urine:        Psychiatric    Major depression:         Hematologic    Bleeding problems:    Problems with blood clotting too easily:        Skin  Rashes or ulcers:        Constitutional    Fever or chills:     PHYSICAL EXAM:   Vitals:   02/03/18 1007  02/03/18 1009  BP: (!) 89/63 (!) 192/75  Pulse: 62   Resp: 16   Temp: (!) 97 F (36.1 C)   TempSrc: Oral   SpO2: 97%   Weight: 152 lb (68.9 kg)   Height: 5\' 6"  (1.676 m)     GENERAL: The patient is a well-nourished male, in no acute distress. The vital signs are documented above. CARDIAC: There is a regular rate and rhythm.  VASCULAR: He has bilateral carotid bruits. On the right side I cannot palpate a femoral pulse.  I cannot palpate pedal pulses. On the left side, he has a palpable femoral pulse.  I cannot palpate pedal pulses. He has no significant lower extremity swelling. PULMONARY: There is good air exchange bilaterally without wheezing or rales. ABDOMEN: Soft and non-tender with normal pitched bowel sounds.  I do not palpate an abdominal aortic aneurysm. MUSCULOSKELETAL: There are no major deformities or cyanosis. NEUROLOGIC: No focal weakness or paresthesias are detected. SKIN: He has no ischemic ulcers on his feet.  He has some chronic ischemic changes to the right foot. PSYCHIATRIC: The patient has a normal affect.  DATA:    CAROTID DUPLEX: I have independently interpreted his carotid duplex scan today.  On the right side there is a less than 39% stenosis.  The vertebral artery has antegrade flow.  On the left side there is a less than 39% stenosis.  The vertebral artery has retrograde flow.  No significant subclavian artery disease was noted.  ARTERIAL DOPPLER STUDY: I have independently interpreted his arterial Doppler study.  On the right side there is a monophasic dorsalis pedis and posterior tibial signal.  ABI is 29%.  On the left side there is a monophasic dorsalis pedis and posterior tibial signal.  ABI is 49%.  Toe pressure on the left is 71 mmHg.  MEDICAL ISSUES:   RIGHT CAROTID STENOSIS: The velocities on the right side have improved and he now has a less than 39% stenosis bilaterally.  He is asymptomatic.  I reassured him that I do not think his  double vision is related to carotid disease.  He is scheduled to see a neurologist.  I think we can stretch his follow-up out to 1 year and I have ordered a carotid duplex scan at that time.  He is on aspirin and is on a statin.  He is not a smoker.  He knows to call sooner if he has problems.  STATUS POST RIGHT EXTERNAL ILIAC ARTERY STENT: This patient underwent angioplasty and stenting of the right external iliac artery in 2011.  The patient does have multilevel arterial occlusive disease on the right however his symptoms are stable.  He does not have any good endovascular options and given his age certainly I would like to avoid surgery if at all possible.  Have ordered follow-up ABIs in 1 year and I will see him back at that time.  He is to call sooner if he has problems.  HYPERTENSION: The patient's initial blood pressure today was elevated. We repeated this and this was still elevated. We have encouraged the patient to follow up with their primary care physician for management of their blood pressure.   [name] [facility], [place] [phone]

## 2018-02-08 DIAGNOSIS — M25511 Pain in right shoulder: Secondary | ICD-10-CM | POA: Diagnosis not present

## 2018-02-08 DIAGNOSIS — M19011 Primary osteoarthritis, right shoulder: Secondary | ICD-10-CM | POA: Insufficient documentation

## 2018-02-08 DIAGNOSIS — M25519 Pain in unspecified shoulder: Secondary | ICD-10-CM | POA: Insufficient documentation

## 2018-02-08 DIAGNOSIS — M7500 Adhesive capsulitis of unspecified shoulder: Secondary | ICD-10-CM | POA: Insufficient documentation

## 2018-02-23 ENCOUNTER — Encounter: Payer: Self-pay | Admitting: Neurology

## 2018-02-23 ENCOUNTER — Ambulatory Visit (INDEPENDENT_AMBULATORY_CARE_PROVIDER_SITE_OTHER): Payer: Medicare Other | Admitting: Neurology

## 2018-02-23 VITALS — BP 178/72 | HR 86 | Ht 66.0 in | Wt 157.0 lb

## 2018-02-23 DIAGNOSIS — G4719 Other hypersomnia: Secondary | ICD-10-CM | POA: Diagnosis not present

## 2018-02-23 DIAGNOSIS — R0683 Snoring: Secondary | ICD-10-CM

## 2018-02-23 DIAGNOSIS — I779 Disorder of arteries and arterioles, unspecified: Secondary | ICD-10-CM

## 2018-02-23 DIAGNOSIS — Z951 Presence of aortocoronary bypass graft: Secondary | ICD-10-CM

## 2018-02-23 DIAGNOSIS — H532 Diplopia: Secondary | ICD-10-CM

## 2018-02-23 DIAGNOSIS — R351 Nocturia: Secondary | ICD-10-CM

## 2018-02-23 NOTE — Progress Notes (Signed)
Subjective:    Patient ID: William [name]. is a 77 y.o. male.  HPI     Star Age, MD, PhD [facility] [address], [address] [place], [place] [address]  Dear Dr. Anastasio Champion,   I saw your patient, William [name], upon your kind request in my clinic today for initial consultation of his sleep disorder, in particular, concern for underlying obstructive sleep apnea. The patient is accompanied by his daughter today. As you know, William [name] is a 77 year old right-handed gentleman with an underlying complex medical history of peripheral vascular disease with status post iliac stenting, coronary artery disease with status post CABG in 2017, hypertension, vitamin D deficiency, COPD, history of non-STEMI, hyperlipidemia, carotid artery disease, and overweight state, who reports snoring and excessive daytime somnolence. I reviewed your office note from 01/05/2018, when he saw Jeralyn Ruths, nurse practitioner. His Epworth sleepiness score is 8 out of 24 today, fatigue score is 32 out of 63. He is widowed and lives alone, he has 2 children. 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 He drinks caffeine in the form of coffee, 2 per day on average. He had a BMP checked at your office on 01/19/2018 and I reviewed the results: BUN was 26, creatinine 0.94. He is trying to hydrate better. He reports a history of double vision for the past 5 years, symptoms have been infrequent, very brief, less than a minute at a time but have increased in frequency, maybe averaging once a month whereas he may have had double vision once every few months in the past. He has seen ophthalmology for this. He denies any one-sided weakness or numbness or stroke like symptoms in the past. He is followed by cardiology. He was encouraged to consider a sleep study  by his cardiologist as well in the past. He has never had a sleep study. He does snore loudly. He is not aware of any family history of OSA. His bedtime is around 8:30 and rise time around 3:30. He has nocturia about twice per average night and denies morning headaches.                                                                        His Past Medical History Is Significant For: Past Medical History:  Diagnosis Date  . Carotid artery occlusion   . Colon polyp   . COPD (chronic obstructive pulmonary disease) ([place])   . Coronary artery disease   . Hyperlipidemia   . Hypertension   . 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 Myocardial infarction ([place])   . Peripheral arterial disease ([place])     His Past Surgical History Is Significant For: Past Surgical History:  Procedure Laterality Date  . ABDOMINAL AORTOGRAM W/LOWER EXTREMITY N/A 08/21/2017   Procedure: ABDOMINAL AORTOGRAM W/LOWER EXTREMITY;  Surgeon: Angelia Mould, MD;  Location: [facility];  Service: Cardiovascular;  Laterality: N/A;  . Bilateral renal  artery stenoses  04/23/2009  . CARDIAC CATHETERIZATION N/A 05/21/2015   Procedure: Left Heart Cath  and Coronary Angiography;  Surgeon: Lorretta Harp, MD;  Location: [facility];  Service: Cardiovascular;  Laterality: N/A;  . CORONARY ARTERY BYPASS GRAFT N/A 05/22/2015   Procedure: CORONARY ARTERY BYPASS GRAFTING (CABG) LIMA-LAD and SVG-OM EVH RIGHT THIGH GREATER SAPHENOUS VEIN;  Surgeon: [name], MD;  Location: [facility];  Service: Open Heart Surgery;  Laterality: N/A;  . HIATAL HERNIA REPAIR    . Percutaneous translumnial angioplasty  04/23/2009   Catheterization of [place] external iliac artery with Left lower extremity runoff  . TEE WITHOUT CARDIOVERSION N/A 05/22/2015   Procedure: TRANSESOPHAGEAL ECHOCARDIOGRAM (TEE);  Surgeon: [name], MD;  Location: [facility];  Service: Open Heart Surgery;  Laterality: N/A;    His Family History Is Significant  For: Family History  Problem Relation Age of Onset  . Heart disease Father        Heart Disease before age 46  . Pulmonary embolism Father   . Deep vein thrombosis Father   . Cancer Mother        Sarcoma    His Social History Is Significant For: Social History   Socioeconomic History  . Marital status: Married    Spouse name: Not on file  . Number of children: Not on file  . Years of education: Not on file  . Highest education level: Not on file  Occupational History  . Not on file  Social Needs  . Financial resource strain: Not on file  . Food insecurity:    Worry: Not on file    Inability: Not on file  . Transportation needs:    Medical: Not on file    Non-medical: Not on file  Tobacco Use  . Smoking status: Former Smoker    Packs/day: 2.00    Years: 30.00    Pack years: 60.00    Types: Cigarettes    Start date: 06/03/1961    Last attempt to quit: 11/17/1991    Years since quitting: 26.2  . Smokeless tobacco: Former Systems developer    Types: Snuff  Substance and Sexual Activity  . Alcohol use: No    Alcohol/week: 2.0 - 4.0 standard drinks    Types: 2 - 4 Standard drinks or equivalent per week    Comment: used to   . Drug use: No  . Sexual activity: Not on file  Lifestyle  . Physical activity:    Days per week: Not on file    Minutes per session: Not on file  . Stress: Not on file  Relationships  . Social connections:    Talks on phone: Not on file    Gets together: Not on file    Attends religious service: Not on file    Active member of club or organization: Not on file    Attends meetings of clubs or organizations: Not on file    Relationship status: Not on file  Other Topics Concern  . Not on file  Social History Narrative  . Not on file    His Allergies Are:  Allergies  Allergen Reactions  . Lipitor [Atorvastatin] Other (See Comments)    Leg pain (currently taking and tolerating Crestor)  . Zocor [Simvastatin] Other (See Comments)    Leg pain  (currrently taking and tolerating Crestor)  :   His Current Medications Are:  Outpatient Encounter Medications as of 02/23/2018  Medication Sig  . amLODipine (NORVASC) 10 MG tablet Take 1 tablet (10 mg total) by mouth daily. (Patient taking differently: Take 10 mg  by mouth daily after supper. )  . Ascorbic Acid (VITAMIN C) 1000 MG tablet Take 1,000 mg by mouth daily.  Marland Kitchen aspirin EC 81 MG tablet Take 1 tablet (81 mg total) by mouth daily.  . fish oil-omega-3 fatty acids 1000 MG capsule Take 1,000 g by mouth daily.   . folic acid (FOLVITE) 259 MCG tablet Take 400 mcg by mouth daily.  Marland Kitchen gabapentin (NEURONTIN) 300 MG capsule Take 300 mg by mouth at bedtime.   . Garlic 5638 MG TABS Take 1,250 mg by mouth daily.   Marland Kitchen ibuprofen (ADVIL,MOTRIN) 200 MG tablet Take 400 mg by mouth every 8 (eight) hours as needed (for pain/headaches.).  Marland Kitchen lisinopril (PRINIVIL,ZESTRIL) 20 MG tablet TAKE 1 TABLET BY MOUTH EVERY DAY  . metoprolol tartrate (LOPRESSOR) 25 MG tablet TAKE 1 TABLET BY MOUTH TWICE A DAY  . Multiple Vitamin (MULTIVITAMIN) tablet Take 1 tablet by mouth daily.  . rosuvastatin (CRESTOR) 10 MG tablet TAKE 1 TABLET BY MOUTH EVERY DAY  . SPIRIVA HANDIHALER 18 MCG inhalation capsule Place 18 mcg into inhaler and inhale daily.   Marland Kitchen thiamine (VITAMIN B-1) 100 MG tablet Take 100 mg by mouth every evening.   . thyroid (ARMOUR) 60 MG tablet Take 60 mg by mouth daily before breakfast.  . vitamin E 400 UNIT capsule Take 400 Units by mouth daily.   No facility-administered encounter medications on file as of 02/23/2018.   :  Review of Systems:  Out of a complete 14 point review of systems, all are reviewed and negative with the exception of these symptoms as listed below:  Review of Systems  Neurological:       Patient stated that he has been dealing with diplopia for years due to drinking but he stated that he has gotten worse over the last couple of years. He also mentioned that he had a fall to where he  hit his head and a skin tear to his left hand. He stated that he does snore a lot but it hasn't effected his sleep.     Epworth Sleepiness Scale 0= would never doze 1= slight chance of dozing 2= moderate chance of dozing 3= high chance of dozing  Sitting and reading:0 Watching TV:1 Sitting inactive in a public place (ex. Theater or meeting):0 As a passenger in a car for an hour without a break:1 Lying down to rest in the afternoon:3 Sitting and talking to someone:0 Sitting quietly after lunch (no alcohol):3 In a car, while stopped in traffic:0 Total:8     Objective:  Neurological Exam  Physical Exam Physical Examination:   Vitals:   02/23/18 1528  BP: (!) 178/72  Pulse: 86    General Examination: The patient is a very pleasant 76 y.o. male in no acute distress. He appears well-developed and well-nourished and well groomed.   HEENT: Normocephalic, atraumatic, pupils are equal, round and reactive to light and accommodation. No double vision currently, extra ocular tracking is preserved.no eye deviation. Hearing is grossly intact. Face is symmetric with normal facial animation and normal facial sensation. Speech is clear with no dysarthria noted. There is no hypophonia. There is no lip, neck/head, jaw or voice tremor. Neck is supple with full range of passive and active motion. There are no carotid bruits on auscultation. Oropharynx exam reveals: mild mouth dryness, adequate dental hygiene and moderate airway crowding, due to Redundant soft palate, tonsils are 1+, neck circumference is 16-7/8 inches. Mallampati is class II. Tongue protrudes centrally and palate elevates symmetrically.  Tongue protrudes centrally and palate elevates symmetrically.  Chest: Clear to auscultation without wheezing, rhonchi or crackles noted.  Heart: S1+S2+0, regular and normal without murmurs, rubs or gallops noted.   Abdomen: Soft, non-tender and non-distended with normal bowel sounds appreciated  on auscultation.  Extremities: There is no pitting edema in the distal lower extremities bilaterally.   Skin: Warm and dry without trophic changes noted.  Musculoskeletal: exam reveals no obvious joint deformities, tenderness or joint swelling or erythema.   Neurologically:  Mental status: The patient is awake, alert and oriented in all 4 spheres. His immediate and remote memory, attention, language skills and fund of knowledge are appropriate. There is no evidence of aphasia, agnosia, apraxia or anomia. Speech is clear with normal prosody and enunciation. Thought process is linear. Mood is normal and affect is normal.  Cranial nerves II - XII are as described above under HEENT exam. In addition: shoulder shrug is normal with equal shoulder height noted. Motor exam: Normal bulk, strength and tone is noted. There is no tremor. Fine motor skills and coordination: grossly intact.  Cerebellar testing: No dysmetria or intention tremor. There is no truncal or gait ataxia.  Sensory exam: intact to light touch.  Gait, station and balance: He stands easily. No veering to one side is noted. No leaning to one side is noted. Posture is age-appropriate and stance is narrow based. Gait shows normal stride length and normal pace. No problems turning are noted.   Assessment and Plan:  In summary, Trevious Rampey. is a very pleasant 77 y.o.-year old male with an underlying complex medical history of peripheral vascular disease with status post iliac stenting, coronary artery disease with status post CABG in 2017, hypertension, vitamin D deficiency, COPD, history of non-STEMI, hyperlipidemia, carotid artery disease, and overweight state, who presents for evaluation of his sleep disturbance. His history and examination are concerning for underlying obstructive sleep apnea. He does have multiple vascular risk factors and stroke risk factors. His diplopia has been ongoing for years and is very brief and intermittent,  no obvious abnormality extra ocular tracking today, no diplopia today. Nevertheless, I suggested we proceed with a brain MRI with and without contrast to rule out a structural cause of his intermittent diplopia. I had a long chat with the patient and his daughter about my findings and the diagnosis of OSA, its prognosis and treatment options. We talked about medical treatments, surgical interventions and non-pharmacological approaches. I explained in particular the risks and ramifications of untreated moderate to severe OSA, especially with respect to developing cardiovascular disease down the Road, including congestive heart failure, difficult to treat hypertension, cardiac arrhythmias, or stroke. Even type 2 diabetes has, in part, been linked to untreated OSA. Symptoms of untreated OSA include daytime sleepiness, memory problems, mood irritability and mood disorder such as depression and anxiety, lack of energy, as well as recurrent headaches, especially morning headaches. We talked about trying to maintain a healthy lifestyle in general, as well as the importance of weight control. I encouraged the patient to eat healthy, exercise daily and keep well hydrated, to keep a scheduled bedtime and wake time routine, to not skip any meals and eat healthy snacks in between meals. I advised the patient not to drive when feeling sleepy. I recommended the following at this time: sleep study with potential positive airway pressure titration. (We will score hypopneas at 4%).   I explained the sleep test procedure to the patient and also outlined possible surgical and  non-surgical treatment options of OSA, including the use of a custom-made dental device (which would require a referral to a specialist dentist or oral surgeon), upper airway surgical options, such as pillar implants, radiofrequency surgery, tongue base surgery, and UPPP (which would involve a referral to an ENT surgeon). Rarely, jaw surgery such as  mandibular advancement may be considered.  I also explained the CPAP treatment option to the patient, who indicated that he would be willing to try CPAP if the need arises. I explained the importance of being compliant with PAP treatment, not only for insurance purposes but primarily to improve His symptoms, and for the patient's long term health benefit, including to reduce His cardiovascular risks. I answered all their questions today and the patient and his daughter were in agreement. I would like to see him back after the sleep study is completed and encouraged him to call with any interim questions, concerns, problems or updates.   Thank you very much for allowing me to participate in the care of this nice patient. If I can be of any further assistance to you please do not hesitate to call me at [phone].  Sincerely,   Star Age, MD, PhD

## 2018-02-23 NOTE — Patient Instructions (Addendum)
Thank you for choosing [facility] for your sleep related care! It was nice to meet you today! I appreciate that you entrust me with your sleep related healthcare concerns. I hope, I was able to address at least some of your concerns today, and that I can help you feel reassured and also get better.    Here is what we discussed today and what we came up with as our plan for you:   I will order a brain MRI to rule out a structural cause for your double vision.    Based on your symptoms and your exam I believe you are at risk for obstructive sleep apnea (aka OSA), and I think we should proceed with a sleep study to determine whether you do or do not have OSA and how severe it is. Even, if you have mild OSA, I may want you to consider treatment with CPAP, as treatment of even borderline or mild sleep apnea can result and improvement of symptoms such as sleep disruption, daytime sleepiness, nighttime bathroom breaks, restless leg symptoms, improvement of headache syndromes, even improved mood disorder.   Please remember, the long-term risks and ramifications of untreated moderate to severe obstructive sleep apnea are: increased Cardiovascular disease, including congestive heart failure, stroke, difficult to control hypertension, treatment resistant obesity, arrhythmias, especially irregular heartbeat commonly known as A. Fib. (atrial fibrillation); even type 2 diabetes has been linked to untreated OSA.   Sleep apnea can cause disruption of sleep and sleep deprivation in most cases, which, in turn, can cause recurrent headaches, problems with memory, mood, concentration, focus, and vigilance. Most people with untreated sleep apnea report excessive daytime sleepiness, which can affect their ability to drive. Please do not drive if you feel sleepy. Patients with sleep apnea developed difficulty initiating and maintaining sleep (aka insomnia).   Having sleep apnea may increase your risk for  other sleep disorders, including involuntary behaviors sleep such as sleep terrors, sleep talking, sleepwalking.    Having sleep apnea can also increase your risk for restless leg syndrome and leg movements at night.   Please note that untreated obstructive sleep apnea may carry additional perioperative morbidity. Patients with significant obstructive sleep apnea (typically, in the moderate to severe degree) should receive, if possible, perioperative PAP (positive airway pressure) therapy and the surgeons and particularly the anesthesiologists should be informed of the diagnosis and the severity of the sleep disordered breathing.   I will likely see you back after your sleep study to go over the test results and where to go from there. We will call you after your sleep study to advise about the results (most likely, you will hear from [place], my nurse) and to set up an appointment at the time, as necessary.    Our sleep lab administrative assistant will call you to schedule your sleep study and give you further instructions, regarding the check in process for the sleep study, arrival time, what to bring, when you can expect to leave after the study, etc., and to answer any other logistical questions you may have. If you don't hear back from her by about 2 weeks from now, please feel free to call her direct line at [phone] or you can call our general clinic number, or email [place] through My Chart.

## 2018-03-01 ENCOUNTER — Telehealth: Payer: Self-pay | Admitting: Neurology

## 2018-03-01 NOTE — Telephone Encounter (Signed)
Medicare/mutual of [place] no auth; pt is scheduled at [facility]. for 03/04/18 arrival time is 3:30 PM lvm for pt to be aware. I also left their number of [phone] incase he needed to r/s for any reason.

## 2018-03-04 ENCOUNTER — Encounter: Payer: Self-pay | Admitting: Neurology

## 2018-03-04 DIAGNOSIS — H532 Diplopia: Secondary | ICD-10-CM | POA: Diagnosis not present

## 2018-03-08 ENCOUNTER — Telehealth: Payer: Self-pay

## 2018-03-08 NOTE — Telephone Encounter (Signed)
Received MRI results from [facility].

## 2018-03-10 NOTE — Telephone Encounter (Signed)
Still waiting on MRI cd from [place]. I will check the mail today.

## 2018-03-15 NOTE — Telephone Encounter (Signed)
Received MRI CD from [facility].

## 2018-03-25 NOTE — Telephone Encounter (Signed)
I called to discuss his MRI results. No answer, left a message asking him to call me back.

## 2018-03-25 NOTE — Telephone Encounter (Signed)
I called pt again to discuss his MRI results. No answer, left a message asking him to call me back. 

## 2018-03-25 NOTE — Telephone Encounter (Signed)
I received his brain MRI report from [facility], study conducted on 03/04/2018, brain MRI with and without contrast.  Please call patient and advise him that his brain MRI showed no acute process, no abnormal contrast uptake, no obvious old lesion or stroke or new mass lesion. He had some changes in keeping with hardening of the arteries which we call white matter disease. For reassurance I also asked our [place] specialist, Dr. Felecia Shelling to review the images on CD, and he also concluded that findings are in keeping with what we call white matter changes, these are chronic. There was moderate amount of volume loss which we call atrophy which occurs over time. No obvious cause of his double vision. Essentially, no further action is required on this test at this time, other than re-enforcing the importance of good blood pressure control, good cholesterol control, good blood sugar control, and weight management. Please remind patient to keep any upcoming appointments or tests.   I had recommended a sleep study, disorder but I do not see it scheduled yet, please inquire whether he received a phone call yet about scheduling.

## 2018-03-25 NOTE — Telephone Encounter (Signed)
I called pt and discussed his MRI results. Pt reports that he is working to keep his BP under control. Pt does want to schedule his PSG but has not been contacted yet. Pt verbalized understanding of results. Pt had no questions at this time but was encouraged to call back if questions arise.

## 2018-03-25 NOTE — Telephone Encounter (Signed)
Pt has returned the call to RN Kristen, he is asking for a call back °

## 2018-03-26 NOTE — Telephone Encounter (Signed)
I did attempt a call to pt on 12/2 but had to LVM. Pt has not returned my call back. I just called again and pt is now scheduled for Jan 6 @ 8pm. Will be mailing sleep study info packet to pt.

## 2018-04-05 ENCOUNTER — Ambulatory Visit (INDEPENDENT_AMBULATORY_CARE_PROVIDER_SITE_OTHER): Payer: Medicare Other | Admitting: Neurology

## 2018-04-05 DIAGNOSIS — Z951 Presence of aortocoronary bypass graft: Secondary | ICD-10-CM

## 2018-04-05 DIAGNOSIS — R0683 Snoring: Secondary | ICD-10-CM

## 2018-04-05 DIAGNOSIS — G4733 Obstructive sleep apnea (adult) (pediatric): Secondary | ICD-10-CM

## 2018-04-05 DIAGNOSIS — G4720 Circadian rhythm sleep disorder, unspecified type: Secondary | ICD-10-CM

## 2018-04-05 DIAGNOSIS — G4761 Periodic limb movement disorder: Secondary | ICD-10-CM

## 2018-04-05 DIAGNOSIS — G4719 Other hypersomnia: Secondary | ICD-10-CM

## 2018-04-05 DIAGNOSIS — H532 Diplopia: Secondary | ICD-10-CM

## 2018-04-05 DIAGNOSIS — R351 Nocturia: Secondary | ICD-10-CM

## 2018-04-08 ENCOUNTER — Telehealth: Payer: Self-pay | Admitting: Neurology

## 2018-04-08 ENCOUNTER — Telehealth: Payer: Self-pay

## 2018-04-08 NOTE — Procedures (Signed)
PATIENT'S NAME:  William [name], William [name] DOB:      [birth year]      MR#:    [id number]     DATE OF RECORDING: 04/05/2018 REFERRING M.D.:  Hurshel Party, MD Study Performed:   Baseline Polysomnogram HISTORY: 78 year old man with a history of PVD with status post iliac stenting, CAD with status post CABG, hypertension, vitamin D deficiency, COPD, history of non-STEMI, hyperlipidemia, and overweight state, who reports snoring and excessive daytime somnolence. The patient endorsed the Epworth Sleepiness Scale at 8/24 points. The patient's weight 157 pounds with a height of 66 (inches), resulting in a BMI of 25.2 kg/m2. The patient's neck circumference measured 16.8 inches.  CURRENT MEDICATIONS: Amlodipine, Ascorbic, Aspirin, Omega-3, Folic acid, Gabapentin, Ibuprofen, Liosinopril, Metoprolol Tartrate, Multi-Vitamin, Rosuvastatin, Spiriva, Thiamine and Vitamin E.   PROCEDURE:  This is a multichannel digital polysomnogram utilizing the Somnostar 11.2 system.  Electrodes and sensors were applied and monitored per AASM Specifications.   EEG, EOG, Chin and Limb EMG, were sampled at 200 Hz.  ECG, Snore and Nasal Pressure, Thermal Airflow, Respiratory Effort, CPAP Flow and Pressure, Oximetry was sampled at 50 Hz. Digital video and audio were recorded.      BASELINE STUDY  Lights Out was at 21:34 and Lights On at 04:58.  Total recording time (TRT) was 444.5 minutes, with a total sleep time (TST) of 225 minutes.   The patient's sleep latency was delayed, REM latency was 56 minutes.  The sleep efficiency was 50.6%, which is markedly reduced.     SLEEP ARCHITECTURE: WASO (Wake after sleep onset) was 198.5 minutes with mild to moderate sleep fragmentation noted and one long period of wakefulness between 01:55 and 04:10. There were 30 minutes in Stage N1, 155 minutes Stage N2, 0 minutes Stage N3 and 40 minutes in Stage REM. The percentage of Stage N1 was 13.3%, which is increased, Stage N2 was 68.9%, which is increased,  Stage N3 was absent and Stage R (REM sleep) was 17.8%. The arousals were noted as: 23 were spontaneous, 54 were associated with PLMs, 92 were associated with respiratory events.  RESPIRATORY ANALYSIS:  There were a total of 93 respiratory events:  23 obstructive apneas, 0 central apneas and 0 mixed apneas with a total of 23 apneas and an apnea index (AI) of 6.1 /hour. There were 70 hypopneas with a hypopnea index of 18.7 /hour. The patient also had 0 respiratory event related arousals (RERAs).      The total APNEA/HYPOPNEA INDEX (AHI) was 24.8/hour and the total RESPIRATORY DISTURBANCE INDEX was 24.8 /hour.  35 events occurred in REM sleep and 80 events in NREM. The REM AHI was 52.5 /hour, versus a non-REM AHI of 18.8. The patient spent 74.5 minutes of total sleep time in the supine position and 151 minutes in non-supine.. The supine AHI was 60.4 versus a non-supine AHI of 7.2.  OXYGEN SATURATION & C02:  The Wake baseline 02 saturation was 92%, with the lowest being 81%. Time spent below 89% saturation equaled 22 minutes.    PERIODIC LIMB MOVEMENTS: The patient had a total of 188 Periodic Limb Movements.  The Periodic Limb Movement (PLM) index was 50.1 and the PLM Arousal index was 14.4/hour.  Audio and video analysis did not show any abnormal or unusual movements, behaviors, phonations or vocalizations. The patient took 1 bathroom break. Mild snoring was noted. The EKG was in keeping with normal sinus rhythm (NSR). Post-study, the patient indicated that sleep was worse than usual.   IMPRESSION:  1. Obstructive Sleep Apnea (OSA) 2. Periodic Limb Movement Disorder (PLMD) 3. Dysfunctions associated with sleep stages or arousal from sleep  RECOMMENDATIONS: 1. This study demonstrates moderate to severe obstructive sleep apnea, with a total AHI of 24.8/hour, REM AHI of 52.5/hour, supine AHI of 60.4/hour and O2 nadir of 81%. Treatment with positive airway pressure in the form of CPAP is recommended.  This will require a full night titration study to optimize therapy. Other treatment options may include avoidance of supine sleep position along with weight loss, upper airway or jaw surgery in selected patients or the use of an oral appliance in certain patients. ENT evaluation and/or consultation with a maxillofacial surgeon or dentist may be feasible in some instances. Please note that untreated obstructive sleep apnea may carry additional perioperative morbidity. Patients with significant obstructive sleep apnea should receive perioperative PAP therapy and the surgeons and particularly the anesthesiologist should be informed of the diagnosis and the severity of the sleep disordered breathing. 2. Severe PLMs (periodic limb movements of sleep) were noted during this study with mild to moderate arousals; clinical correlation is recommended.  3. This study shows sleep fragmentation and abnormal sleep stage percentages; these are nonspecific findings and per se do not signify an intrinsic sleep disorder or a cause for the patient's sleep-related symptoms. Causes include (but are not limited to) the first night effect of the sleep study, circadian rhythm disturbances, medication effect or an underlying mood disorder or medical problem.  4. The patient should be cautioned not to drive, work at heights, or operate dangerous or heavy equipment when tired or sleepy. Review and reiteration of good sleep hygiene measures should be pursued with any patient. 5. The patient will be seen in follow-up in the sleep clinic at [facility] for discussion of the test results, symptom and treatment compliance review, further management strategies, etc. The referring provider will be notified of the test results.  I certify that I have reviewed the entire raw data recording prior to the issuance of this report in accordance with the Standards of Accreditation of the American Academy of Sleep Medicine (AASM)   Star Age, MD,  PhD Diplomat, American Board of Neurology and Sleep Medicine (Neurology and Sleep Medicine)

## 2018-04-08 NOTE — Progress Notes (Signed)
Patient referred by Dr. Anastasio Champion, seen by me on 02/23/18, diagnostic PSG on 04/05/18.   Please call and notify the patient that the recent sleep study showed moderate to severe obstructive sleep apnea. I recommend treatment for this in the form of CPAP. This will require a repeat sleep study for proper titration and mask fitting and correct monitoring of the oxygen saturations. Please explain to patient. I have placed an order in the chart. Thanks.  Star Age, MD, PhD [facility])

## 2018-04-08 NOTE — Telephone Encounter (Signed)
Patient was returning phone call. He stated that he would be home most of the day if you wanted to call him back.

## 2018-04-08 NOTE — Telephone Encounter (Signed)
Called patient to discuss sleep study results. No answer at this time. LVM for the patient to call back.   

## 2018-04-08 NOTE — Telephone Encounter (Signed)
I called pt. I advised pt that Dr. Rexene Alberts reviewed their sleep study results and found that has moderate to severe sleep apnea and recommends that pt be treated with a cpap. Dr. Rexene Alberts recommends that pt return for a repeat sleep study in order to properly titrate the cpap and ensure a good mask fit. Pt is agreeable to returning for a titration study. I advised pt that our sleep lab will file with pt's insurance and call pt to schedule the sleep study when we hear back from the pt's insurance regarding coverage of this sleep study. Pt verbalized understanding of results. Pt had no questions at this time but was encouraged to call back if questions arise.

## 2018-04-08 NOTE — Addendum Note (Signed)
Addended by: Star Age on: 04/08/2018 08:34 AM   Modules accepted: Orders

## 2018-04-08 NOTE — Telephone Encounter (Signed)
-----   Message from Star Age, MD sent at 04/08/2018  8:34 AM EST ----- Patient referred by Dr. Anastasio Champion, seen by me on 02/23/18, diagnostic PSG on 04/05/18.   Please call and notify the patient that the recent sleep study showed moderate to severe obstructive sleep apnea. I recommend treatment for this in the form of CPAP. This will require a repeat sleep study for proper titration and mask fitting and correct monitoring of the oxygen saturations. Please explain to patient. I have placed an order in the chart. Thanks.  Star Age, MD, PhD [facility])

## 2018-04-13 DIAGNOSIS — E559 Vitamin D deficiency, unspecified: Secondary | ICD-10-CM | POA: Diagnosis not present

## 2018-04-13 DIAGNOSIS — R5383 Other fatigue: Secondary | ICD-10-CM | POA: Diagnosis not present

## 2018-04-13 DIAGNOSIS — J449 Chronic obstructive pulmonary disease, unspecified: Secondary | ICD-10-CM | POA: Diagnosis not present

## 2018-04-13 DIAGNOSIS — I10 Essential (primary) hypertension: Secondary | ICD-10-CM | POA: Diagnosis not present

## 2018-04-13 DIAGNOSIS — E785 Hyperlipidemia, unspecified: Secondary | ICD-10-CM | POA: Diagnosis not present

## 2018-04-21 ENCOUNTER — Ambulatory Visit (INDEPENDENT_AMBULATORY_CARE_PROVIDER_SITE_OTHER): Payer: Medicare Other | Admitting: Neurology

## 2018-04-21 DIAGNOSIS — G4733 Obstructive sleep apnea (adult) (pediatric): Secondary | ICD-10-CM | POA: Diagnosis not present

## 2018-04-21 DIAGNOSIS — G4761 Periodic limb movement disorder: Secondary | ICD-10-CM

## 2018-04-21 DIAGNOSIS — R351 Nocturia: Secondary | ICD-10-CM

## 2018-04-21 DIAGNOSIS — G4719 Other hypersomnia: Secondary | ICD-10-CM

## 2018-04-21 DIAGNOSIS — Z951 Presence of aortocoronary bypass graft: Secondary | ICD-10-CM

## 2018-04-21 DIAGNOSIS — G4720 Circadian rhythm sleep disorder, unspecified type: Secondary | ICD-10-CM

## 2018-04-26 ENCOUNTER — Telehealth: Payer: Self-pay

## 2018-04-26 NOTE — Addendum Note (Signed)
Addended by: Star Age on: 04/26/2018 08:03 AM   Modules accepted: Orders

## 2018-04-26 NOTE — Telephone Encounter (Signed)
I called pt. I advised pt that Dr. Rexene Alberts reviewed their sleep study results and found that pt did well with the cpap during his latest sleep study. Dr. Rexene Alberts recommends that pt start a cpap at home. I reviewed PAP compliance expectations with the pt. Pt is agreeable to starting a CPAP. I advised pt that an order will be sent to a DME, [facility], and [facility] will call the pt within about one week after they file with the pt's insurance. [facility] will show the pt how to use the machine, fit for masks, and troubleshoot the CPAP if needed. A follow up appt was made for insurance purposes with Dr. Rexene Alberts on 07/12/18 at 8:30am. Pt verbalized understanding to arrive 15 minutes early and bring their CPAP. A letter with all of this information in it will be mailed to the pt as a reminder. I verified with the pt that the address we have on file is correct. Pt verbalized understanding of results. Pt had no questions at this time but was encouraged to call back if questions arise. I have sent the order to [facility] and have received confirmation that they have received the order.

## 2018-04-26 NOTE — Procedures (Signed)
PATIENT'S NAME:  William [name], William [name] DOB:      11-15-1940      MR#:    [id number]     DATE OF RECORDING: 04/21/2018 REFERRING M.D.:  Hurshel Party, MD Study Performed:   CPAP  Titration HISTORY:  78 year old man with a history of PVD with status post iliac stenting, CAD with status post CABG, hypertension, vitamin D deficiency, COPD, history of non-STEMI, hyperlipidemia, and overweight state, who presents for a CPAP titration study. His baseline PSG on 04/05/18 showed moderate to severe obstructive sleep apnea, with a total AHI of 24.8/hour, REM AHI of 52.5/hour, supine AHI of 60.4/hour and O2 nadir of 81%. The patient endorsed the Epworth Sleepiness Scale at 8 points. BMI of 25.2 kg/m2.  CURRENT MEDICATIONS: Amlodipine, Ascorbic, Aspirin, Omega-3, Folic acid, Gabapentin, Ibuprofen, Liosinopril, Metoprolol Tartrate, Multi-Vitamin, Rosuvastatin, Spiriva, Thiamine and Vitamin E.  PROCEDURE:  This is a multichannel digital polysomnogram utilizing the SomnoStar 11.2 system.  Electrodes and sensors were applied and monitored per AASM Specifications.   EEG, EOG, Chin and Limb EMG, were sampled at 200 Hz.  ECG, Snore and Nasal Pressure, Thermal Airflow, Respiratory Effort, CPAP Flow and Pressure, Oximetry was sampled at 50 Hz. Digital video and audio were recorded.      The patient was fitted with medium P10 nasal pillows. CPAP was initiated at 5 cmH20 with heated humidity per AASM standards and pressure was advanced to 9 cmH20 because of hypopneas, apneas and desaturations.  At a PAP pressure of 9 cmH20, there was a reduction of the AHI to 0.5/hour with supine REM sleep achieved and O2 nadir of 89%.  Lights Out was at 20:49 and Lights On at 04:51. Total recording time (TRT) was 483 minutes, with a total sleep time (TST) of 278 minutes. The patient's sleep latency to persistent sleep was delayed at 107 minutes. REM latency was 140 minutes, which is delayed. The sleep efficiency was 57.6 %.    SLEEP  ARCHITECTURE: WASO (Wake after sleep onset) was 160 minutes with several longer periods of wakefulness. There were 15.5 minutes in Stage N1, 195 minutes Stage N2, 4.5 minutes Stage N3 and 63 minutes in Stage REM.  The percentage of Stage N1 was 5.6%, Stage N2 was 70.1%, which is increased, Stage N3 was 1.6% and Stage R (REM sleep) was 22.7%, which is normal. The arousals were noted as: 21 were spontaneous, 9 were associated with PLMs, 21 were associated with respiratory events.  RESPIRATORY ANALYSIS:  There was a total of 23 respiratory events: 10 obstructive apneas, 7 central apneas and 1 mixed apneas with a total of 18 apneas and an apnea index (AI) of 3.9 /hour. There were 5 hypopneas with a hypopnea index of 1.1/hour. The patient also had 1 respiratory event related arousals (RERAs).      The total APNEA/HYPOPNEA INDEX (AHI) was 5. /hour and the total RESPIRATORY DISTURBANCE INDEX was 5.2 /hour  6 events occurred in REM sleep and 17 events in NREM. The REM AHI was 5.7 /hour versus a non-REM AHI of 4.7 /hour.  The patient spent 112 minutes of total sleep time in the supine position and 166 minutes in non-supine. The supine AHI was 7.5, versus a non-supine AHI of 3.3.  OXYGEN SATURATION & C02:  The baseline 02 saturation was 94%, with the lowest being 86%. Time spent below 89% saturation equaled 1 minutes.  PERIODIC LIMB MOVEMENTS:  The patient had a total of 98 Periodic Limb Movements. The Periodic Limb Movement (PLM) index  was 21.2 and the PLM Arousal index was 1.9 /hour.   Audio and video analysis did not show any abnormal or unusual movements, behaviors, phonations or vocalizations. The patient took 1 bathroom break. The EKG was in keeping with normal sinus rhythm.  Post-study, the patient indicated that sleep was the same as usual.   IMPRESSION:  1. Obstructive Sleep Apnea (OSA) 2. Periodic Limb Movement Disorder (PLMD) 3. Dysfunctions associated with sleep stages or arousal from sleep    RECOMMENDATIONS: 1. This study demonstrates near-resolution of the patient's obstructive sleep apnea with CPAP therapy. I would suggest a home treatment pressure of 10 cm, medium nasal pillows with heated humidity. The patient should be reminded to be fully compliant with PAP therapy to improve sleep related symptoms and decrease long term cardiovascular risks. The patient should be reminded, that it may take up to 3 months to get fully used to using PAP with all planned sleep. The earlier full compliance is achieved, the better long term compliance tends to be. Please note that untreated obstructive sleep apnea may carry additional perioperative morbidity. Patients with significant obstructive sleep apnea should receive perioperative PAP therapy and the surgeons and particularly the anesthesiologist should be informed of the diagnosis and the severity of the sleep disordered breathing. 2. Mild PLMs (periodic limb movements of sleep) were noted during this study with no significant arousals; clinical correlation is recommended.  3. This study shows sleep fragmentation and abnormal sleep stage percentages; these are nonspecific findings and per se do not signify an intrinsic sleep disorder or a cause for the patient's sleep-related symptoms. Causes include (but are not limited to) the first night effect of the sleep study, circadian rhythm disturbances, medication effect or an underlying mood disorder or medical problem.  4. The patient should be cautioned not to drive, work at heights, or operate dangerous or heavy equipment when tired or sleepy. Review and reiteration of good sleep hygiene measures should be pursued with any patient. 5. The patient will be seen in follow-up in the sleep clinic at [facility] for discussion of the test results, symptom and treatment compliance review, further management strategies, etc. The referring provider will be notified of the test results.   I certify that I have reviewed  the entire raw data recording prior to the issuance of this report in accordance with the Standards of Accreditation of the American Academy of Sleep Medicine (AASM)   Star Age, MD, PhD Diplomat, American Board of Neurology and Sleep Medicine (Neurology and Sleep Medicine)

## 2018-04-26 NOTE — Telephone Encounter (Signed)
-----   Message from William Age, MD sent at 04/26/2018  8:03 AM EST ----- Patient referred by Dr. Anastasio Champion, seen by me on 02/23/18, diagnostic PSG on 04/05/18. Patient had a CPAP titration study on 04/21/18.  Please call and inform patient that I have entered an order for treatment with positive airway pressure (PAP) treatment for obstructive sleep apnea (OSA). He did well during the latest sleep study with CPAP. We will, therefore, arrange for a machine for home use through a DME (durable medical equipment) company of His choice; and I will see the patient back in follow-up in about 10 weeks. Please also explain to the patient that I will be looking out for compliance data, which can be downloaded from the machine (stored on an SD card, that is inserted in the machine) or via remote access through a modem, that is built into the machine. At the time of the followup appointment we will discuss sleep study results and how it is going with PAP treatment at home. Please advise patient to bring His machine at the time of the first FU visit, even though this is cumbersome. Bringing the machine for every visit after that will likely not be needed, but often helps for the first visit to troubleshoot if needed. Please re-enforce the importance of compliance with treatment and the need for [place] to monitor compliance data - often an insurance requirement and actually good feedback for the patient as far as how they are doing.  Also remind patient, that any interim PAP machine or mask issues should be first addressed with the DME company, as they can often help better with technical and mask fit issues. Please ask if patient has a preference regarding DME company.  Please also make sure, the patient has a follow-up appointment with me in about 10 weeks from the setup date, thanks. May see one of our nurse practitioners if needed for proper timing of the FU appointment.  Please fax or rout report to the referring provider.  Thanks,   William Age, MD, PhD [facility])

## 2018-04-26 NOTE — Progress Notes (Signed)
Patient referred by Dr. Anastasio Champion, seen by me on 02/23/18, diagnostic PSG on 04/05/18. Patient had a CPAP titration study on 04/21/18.  Please call and inform patient that I have entered an order for treatment with positive airway pressure (PAP) treatment for obstructive sleep apnea (OSA). He did well during the latest sleep study with CPAP. We will, therefore, arrange for a machine for home use through a DME (durable medical equipment) company of His choice; and I will see the patient back in follow-up in about 10 weeks. Please also explain to the patient that I will be looking out for compliance data, which can be downloaded from the machine (stored on an SD card, that is inserted in the machine) or via remote access through a modem, that is built into the machine. At the time of the followup appointment we will discuss sleep study results and how it is going with PAP treatment at home. Please advise patient to bring His machine at the time of the first FU visit, even though this is cumbersome. Bringing the machine for every visit after that will likely not be needed, but often helps for the first visit to troubleshoot if needed. Please re-enforce the importance of compliance with treatment and the need for [place] to monitor compliance data - often an insurance requirement and actually good feedback for the patient as far as how they are doing.  Also remind patient, that any interim PAP machine or mask issues should be first addressed with the DME company, as they can often help better with technical and mask fit issues. Please ask if patient has a preference regarding DME company.  Please also make sure, the patient has a follow-up appointment with me in about 10 weeks from the setup date, thanks. May see one of our nurse practitioners if needed for proper timing of the FU appointment.  Please fax or rout report to the referring provider. Thanks,   Star Age, MD, PhD [facility])

## 2018-05-07 NOTE — Telephone Encounter (Signed)
Pt called to advise he started CPAP last night  FYI

## 2018-05-07 NOTE — Telephone Encounter (Signed)
Noted, thanks!

## 2018-05-31 ENCOUNTER — Other Ambulatory Visit: Payer: Self-pay | Admitting: *Deleted

## 2018-05-31 MED ORDER — ROSUVASTATIN CALCIUM 10 MG PO TABS: 10.0000 mg | ORAL_TABLET | Freq: Every day | ORAL | 1 refills | Status: DC

## 2018-06-11 ENCOUNTER — Encounter (INDEPENDENT_AMBULATORY_CARE_PROVIDER_SITE_OTHER): Payer: Self-pay | Admitting: Internal Medicine

## 2018-06-11 ENCOUNTER — Encounter (INDEPENDENT_AMBULATORY_CARE_PROVIDER_SITE_OTHER): Payer: Self-pay | Admitting: Nurse Practitioner

## 2018-06-17 ENCOUNTER — Telehealth: Payer: Self-pay | Admitting: Cardiovascular Disease

## 2018-06-17 NOTE — Telephone Encounter (Signed)
I called but was unable to reach the patient.  I left him a voicemail explaining that if his cardiac symptoms are stable, that I would recommend rescheduling his appointment to avoid unnecessary exposure during the [place] pandemic.  I have asked him to give our office a call back.

## 2018-06-18 NOTE — Telephone Encounter (Signed)
Appointment has been rescheduled to Friday, 08/27/2018 at 2:00 with Dr. Bronson Ing in [place] office.

## 2018-06-22 ENCOUNTER — Ambulatory Visit: Payer: Medicare Other | Admitting: Cardiovascular Disease

## 2018-07-07 ENCOUNTER — Telehealth: Payer: Self-pay | Admitting: Neurology

## 2018-07-07 NOTE — Telephone Encounter (Signed)
Due to current COVID 19 pandemic, our office is severely reducing in office visits for at least the next 2 weeks, in order to minimize the risk to our patients and healthcare providers.   Called patient to offer a sooner appointment with Dr. Rexene Alberts. I first offered a virtual visit, but patient declined because he is not comfortable with technology. I then offered him a telephone visit to which he agreed and gave consent. His appointment was moved to 04/14. I advised him that Dr. Guadelupe Sabin nurse will reach out to him to go over his medical history.   Pt understands that although there may be some limitations with this type of visit, we will take all precautions to reduce any security or privacy concerns.  Pt understands that this will be treated like an in office visit and we will file with pt's insurance, and there may be a patient responsible charge related to this service.

## 2018-07-07 NOTE — Telephone Encounter (Signed)
I called pt. Pt's meds, allergies, and PMH were updated.  Pt reports that his cpap is going well. His BP is under better control since he started cpap. Pt feels less tired during the day.  Pt understands that Dr. Rexene Alberts will call him around 1pm on 07/13/2018.  Epworth Sleepiness Scale 0= would never doze 1= slight chance of dozing 2= moderate chance of dozing 3= high chance of dozing  Sitting and reading: 0 Watching TV: 0 Sitting inactive in a public place (ex. Theater or meeting): 0 As a passenger in a car for an hour without a break: 0 Lying down to rest in the afternoon: 1 Sitting and talking to someone: 0 Sitting quietly after lunch (no alcohol): 1 In a car, while stopped in traffic: 0 Total: 2

## 2018-07-11 ENCOUNTER — Encounter: Payer: Self-pay | Admitting: Adult Health

## 2018-07-12 ENCOUNTER — Ambulatory Visit: Payer: Self-pay | Admitting: Neurology

## 2018-07-13 ENCOUNTER — Encounter: Payer: Self-pay | Admitting: Neurology

## 2018-07-13 ENCOUNTER — Ambulatory Visit (INDEPENDENT_AMBULATORY_CARE_PROVIDER_SITE_OTHER): Payer: Medicare Other | Admitting: Neurology

## 2018-07-13 ENCOUNTER — Other Ambulatory Visit: Payer: Self-pay

## 2018-07-13 ENCOUNTER — Ambulatory Visit (INDEPENDENT_AMBULATORY_CARE_PROVIDER_SITE_OTHER): Payer: Medicare Other | Admitting: Nurse Practitioner

## 2018-07-13 DIAGNOSIS — Z9989 Dependence on other enabling machines and devices: Secondary | ICD-10-CM

## 2018-07-13 DIAGNOSIS — G4733 Obstructive sleep apnea (adult) (pediatric): Secondary | ICD-10-CM | POA: Diagnosis not present

## 2018-07-13 NOTE — Patient Instructions (Signed)
Given over the phone during today's phone call virtual visit.  

## 2018-07-13 NOTE — Progress Notes (Signed)
Interim history:  William [name] is a 78 year old right-handed gentleman with an underlying complex medical history of peripheral vascular disease with status post iliac stenting, coronary artery disease with status post CABG in 2017, hypertension, vitamin D deficiency, COPD, history of non-STEMI, hyperlipidemia, carotid artery disease, and overweight state, with whom I am conducting a virtual, phone based follow-up appointment today in lieu of a face-to-face visit for follow-up consultation of his obstructive sleep apnea after interim sleep study testing and starting CPAP therapy. The patient is unaccompanied today and joins from home. I first met him on 02/23/2018 at the request of his primary care physician, Dr. Anastasio [name], at which time he reported snoring and excessive daytime somnolence. He was advised to proceed with sleep study testing. I went over his test results with him today. He had a baseline sleep study on 04/05/2018 which showed a sleep latency delayed, REM latency 56 minutes, sleep efficiency reduced at 50.6%, he had absence of slow-wave sleep and REM sleep was 17.8%, total AHI 24.8 per hour, supine AHI 60.4 per hour and REM AHI 52.5 per hour. Average oxygen saturation 92%, nadir of 81%. He had severe PLMS with mild arousals. He was invited for a CPAP titration study. This was on 04/21/2018. Sleep efficiency 57.6%, sleep latency delayed, REM latency 140 minutes. He had near absence of slow-wave sleep and REM sleep was normal at 22.7%. CPAP was titrated from 5 cm to 9 cm. On the final pressure his AHI was 0.5 per hour, O2 nadir of 89% with supine REM sleep achieved. Based on his test results I prescribed CPAP therapy for home use at a pressure of 10 cm.  He also had an interim brain MRI in December 2019 because of his report of intermittent diplopia. He had the study in [place], [place], brain MRI with and without contrast showed periventricular white matter changes, no abnormal  enhancement.  Today, 07/13/2018: Please also see below for virtual visit documentation.  I reviewed his CPAP compliance data from 06/12/2018 through 07/11/2018 which is a total of 30 days, during which time he used his machine every night with percent used days greater than 4 hours at 90%, indicating excellent compliance with an average usage of 5 hours and 39 minutes, residual AHI at goal at 2.3 per hour, leak high with the 95th percentile at 42.2 L/m on a pressure of 10 cm.  Previously:  02/23/2018: (He) reports snoring and excessive daytime somnolence. I reviewed your office note from 01/05/2018, when he saw Jeralyn Ruths, nurse practitioner. His Epworth sleepiness score is 8 out of 24 today, fatigue score is 32 out of 63. He is widowed and lives alone, he has 2 children. 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 He drinks caffeine in the form of coffee, 2 per day on average. He had a BMP checked at your office on 01/19/2018 and I reviewed the results: BUN was 26, creatinine 0.94. He is trying to hydrate better. He reports a history of double vision for the past 5 years, symptoms have been infrequent, very brief, less than a minute at a time but have increased in frequency, maybe averaging once a month whereas he may have had double vision once every few months in the past. He has seen ophthalmology for this. He denies any one-sided weakness or numbness  or stroke like symptoms in the past. He is followed by cardiology. He was encouraged to consider a sleep study by his cardiologist as well in the past. He has never had a sleep study. He does snore loudly. He is not aware of any family history of OSA. His bedtime is around 8:30 and rise time around 3:30. He has nocturia about twice per average night and denies morning headaches.  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[facility])    Myocardial infarction ([place])    Peripheral arterial disease ([place])     His Past Surgical History Is Significant For: Past Surgical History:  Procedure Laterality Date   ABDOMINAL AORTOGRAM W/LOWER EXTREMITY N/A 08/21/2017   Procedure: ABDOMINAL AORTOGRAM W/LOWER EXTREMITY;  Surgeon: Angelia Mould, MD;  Location: [facility];  Service: Cardiovascular;  Laterality: N/A;   Bilateral renal  artery stenoses  04/23/2009   CARDIAC CATHETERIZATION N/A 05/21/2015   Procedure: Left Heart Cath and Coronary Angiography;  Surgeon: Lorretta Harp, MD;  Location: [facility];  Service: Cardiovascular;  Laterality: N/A;   CORONARY ARTERY BYPASS GRAFT N/A 05/22/2015   Procedure: CORONARY ARTERY BYPASS GRAFTING (CABG) LIMA-LAD and SVG-OM EVH RIGHT THIGH GREATER SAPHENOUS VEIN;  Surgeon: [name], MD;  Location: [facility];  Service: Open Heart Surgery;  Laterality: N/A;   HIATAL HERNIA REPAIR     Percutaneous translumnial angioplasty  04/23/2009   Catheterization of Lefst external iliac artery with Left lower extremity runoff   TEE WITHOUT CARDIOVERSION N/A 05/22/2015   Procedure: TRANSESOPHAGEAL ECHOCARDIOGRAM (TEE);  Surgeon: [name], MD;  Location: [facility];  Service: Open Heart Surgery;  Laterality: N/A;    His Family History Is Significant For: Family History  Problem Relation Age of Onset   Heart disease Father        Heart Disease before age 60   Pulmonary embolism Father    Deep vein thrombosis Father    Cancer Mother        Sarcoma    His Social History Is Significant For: Social History   Socioeconomic History   Marital status: Married    Spouse name: Not on file   Number of children:  Not on file   Years of education: Not on file   Highest education level: Not on file  Occupational History   Not on file  Social Needs   Financial resource strain: Not on file   Food insecurity:    Worry: Not on file    Inability: Not on file   Transportation needs:    Medical: Not on file    Non-medical: Not on file  Tobacco Use   Smoking status: Former Smoker    Packs/day: 2.00    Years: 30.00    Pack years: 60.00    Types: Cigarettes    Start date: 06/03/1961    Last attempt to quit: 11/17/1991    Years since quitting: 26.6   Smokeless tobacco: Former Systems developer    Types: Snuff  Substance and Sexual Activity   Alcohol use: No    Alcohol/week: 2.0 - 4.0 standard drinks    Types: 2 - 4 Standard drinks or equivalent per week    Comment: used to  Drug use: No   Sexual activity: Not on file  Lifestyle   Physical activity:    Days per week: Not on file    Minutes per session: Not on file   Stress: Not on file  Relationships   Social connections:    Talks on phone: Not on file    Gets together: Not on file    Attends religious service: Not on file    Active member of club or organization: Not on file    Attends meetings of clubs or organizations: Not on file    Relationship status: Not on file  Other Topics Concern   Not on file  Social History Narrative   Not on file    His Allergies Are:  Allergies  Allergen Reactions   Lipitor [Atorvastatin] Other (See Comments)    Leg pain (currently taking and tolerating Crestor)   Zocor [Simvastatin] Other (See Comments)    Leg pain (currrently taking and tolerating Crestor)  :   His Current Medications Are:  Outpatient Encounter Medications as of 07/13/2018  Medication Sig   amLODipine (NORVASC) 10 MG tablet Take 1 tablet (10 mg total) by mouth daily. (Patient taking differently: Take 10 mg by mouth daily after supper. )   Ascorbic Acid (VITAMIN C) 1000 MG tablet Take 1,000 mg by mouth daily.   aspirin  EC 81 MG tablet Take 1 tablet (81 mg total) by mouth daily.   fish oil-omega-3 fatty acids 1000 MG capsule Take 1,000 g by mouth daily.    folic acid (FOLVITE) 161 MCG tablet Take 400 mcg by mouth daily.   gabapentin (NEURONTIN) 300 MG capsule Take 300 mg by mouth at bedtime.    Garlic 0960 MG TABS Take 1,250 mg by mouth daily.    ibuprofen (ADVIL,MOTRIN) 200 MG tablet Take 400 mg by mouth every 8 (eight) hours as needed (for pain/headaches.).   lisinopril (PRINIVIL,ZESTRIL) 20 MG tablet TAKE 1 TABLET BY MOUTH EVERY DAY   metoprolol tartrate (LOPRESSOR) 25 MG tablet TAKE 1 TABLET BY MOUTH TWICE A DAY   Multiple Vitamin (MULTIVITAMIN) tablet Take 1 tablet by mouth daily.   rosuvastatin (CRESTOR) 10 MG tablet Take 1 tablet (10 mg total) by mouth daily.   SPIRIVA HANDIHALER 18 MCG inhalation capsule Place 18 mcg into inhaler and inhale daily.    thiamine (VITAMIN B-1) 100 MG tablet Take 100 mg by mouth every evening.    thyroid (ARMOUR) 60 MG tablet Take 60 mg by mouth daily before breakfast.   vitamin E 400 UNIT capsule Take 400 Units by mouth daily.   No facility-administered encounter medications on file as of 07/13/2018.   :  Review of Systems:  Out of a complete 14 point review of systems, all are reviewed and negative with the exception of these symptoms as listed below:  Virtual Visit via Telephone Note on 07/13/18:   I connected with William [name] on 07/13/18 at  1:00 PM EDT by telephone and verified that I am speaking with the correct person using two identifiers.   I discussed the limitations, risks, security and privacy concerns of performing an evaluation and management service by telephone and the availability of in person appointments. I also discussed with the patient that there may be a patient responsible charge related to this service. The patient expressed understanding and agreed to proceed.   History of Present Illness:  He reports doing better with the CPAP,  it took him a while to get adjusted to it. It  is hard for him to keep his mouth closed, he would rather not try a chinstrap which was apparently offered to him by his DME provider. He feels that he is sleeping fairly well. He does have ringing in the ears and it seems like it became worse when he started with the CPAP, set up date was 05/06/2018. He uses nasal pillows. He does have mouth dryness. His Epworth sleepiness score currently is 2 out of 24. He is motivated to continue with treatment. His blood pressure numbers have improved, he checks his blood pressure at home with a upper arm unit daily.  Interestingly, because of the stay-at-home recommendation, he has started reading more, whereas he did not enjoy reading very much before.   Observations/Objective: There are no recent vital signs for my review, his most recent vital signs in his chart are from 02/23/2018. His blood pressure this morning by self-report was 133/61. He appears to be in no acute distress, possibly some hearing issues, has asked me to repeat a few times. His speech is clear, not dysarthric, no voice tremor, no hypophonia noted.  Assessment and Plan: In summary, William [name]. is a very pleasant 78 year old male with an underlying complex medical history of peripheral vascular disease with status post iliac stenting, coronary artery disease with status post CABG in 2017, hypertension, vitamin D deficiency, COPD, history of non-STEMI, hyperlipidemia, carotid artery disease, and overweight state, who presents for a virtual, phone based FU consultation of his obstructive sleep apnea after interim sleep study testing. He had a baseline sleep study in early January 2020 which showed moderate to severe obstructive sleep apnea with a total AHI of 24.8 per hour, O2 nadir of 81%. He had a subsequent CPAP titration study in late January 2020 with good results with CPAP. He is compliant with CPAP therapy and indicates having adjusted well  and good results, it is worth it to him to continue he states. He is commended for his treatment adherence. Of note, he reports that his diplopia is currently gone, it seems to be intermittent and brief, has been ongoing for years. He had an interim brain MRI with and without contrast in December 2019 without acute changes and without abnormal enhancement.  I suggested we could consider a chinstrap down the road, he is not quite ready to try it. He is also advised that we could potentially reduce the pressure down the Road to see if his ringing in the ears becomes less apparent. We mutually agreed to wait things out. He is advised to continue with the current treatment settings as his sleep apnea scores a very good and follow-up routinely in 6 months with one of our nurse practitioners. I answered all his questions today and he was in agreement.   Follow Up Instructions: 1. Continue using CPAP regularly with full compliance, patient is commended for treatment adherence. 2. Follow-up in 6 mo, with NP next time.  3. CPAP supply order is current. Patient is up-to-date with supplies, has change the filter, set up date 05/06/2018.  4. Hand hygiene and stay at home recommendation reiterated to patient, he is agreeable. 5. Call or email through My Chart for any interim questions or concerns.   I discussed the assessment and treatment plan with the patient. The patient was provided an opportunity to ask questions and all were answered. The patient agreed with the plan and demonstrated an understanding of the instructions.   The patient was advised to call back or  seek an in-person evaluation if the symptoms worsen or if the condition fails to improve as anticipated.  I provided 13 minutes of non-face-to-face time during this encounter.   Star Age, MD

## 2018-07-28 ENCOUNTER — Ambulatory Visit: Payer: Medicare Other | Admitting: Neurology

## 2018-08-24 ENCOUNTER — Telehealth: Payer: Self-pay | Admitting: *Deleted

## 2018-08-24 NOTE — Telephone Encounter (Signed)
Patient verbally consented for telehealth visits with [facility] and understands that his insurance company will be billed for the encounter.   Aware to have vitals available 

## 2018-08-27 ENCOUNTER — Telehealth (INDEPENDENT_AMBULATORY_CARE_PROVIDER_SITE_OTHER): Payer: Medicare Other | Admitting: Cardiovascular Disease

## 2018-08-27 ENCOUNTER — Encounter: Payer: Self-pay | Admitting: *Deleted

## 2018-08-27 ENCOUNTER — Encounter: Payer: Self-pay | Admitting: Cardiovascular Disease

## 2018-08-27 VITALS — BP 146/68 | HR 76 | Temp 97.4°F | Ht 66.0 in | Wt 158.5 lb

## 2018-08-27 DIAGNOSIS — I779 Disorder of arteries and arterioles, unspecified: Secondary | ICD-10-CM

## 2018-08-27 DIAGNOSIS — Z95828 Presence of other vascular implants and grafts: Secondary | ICD-10-CM

## 2018-08-27 DIAGNOSIS — I25708 Atherosclerosis of coronary artery bypass graft(s), unspecified, with other forms of angina pectoris: Secondary | ICD-10-CM | POA: Diagnosis not present

## 2018-08-27 DIAGNOSIS — I6523 Occlusion and stenosis of bilateral carotid arteries: Secondary | ICD-10-CM

## 2018-08-27 DIAGNOSIS — I10 Essential (primary) hypertension: Secondary | ICD-10-CM

## 2018-08-27 DIAGNOSIS — R5383 Other fatigue: Secondary | ICD-10-CM

## 2018-08-27 DIAGNOSIS — E785 Hyperlipidemia, unspecified: Secondary | ICD-10-CM

## 2018-08-27 MED ORDER — LISINOPRIL 40 MG PO TABS: 40.0000 mg | ORAL_TABLET | Freq: Every day | ORAL | 6 refills | Status: DC

## 2018-08-27 NOTE — Progress Notes (Signed)
Virtual Visit via Telephone Note   This visit type was conducted due to national recommendations for restrictions regarding the COVID-19 Pandemic (e.g. social distancing) in an effort to limit this patient's exposure and mitigate transmission in our community.  Due to his co-morbid illnesses, this patient is at least at moderate risk for complications without adequate follow up.  This format is felt to be most appropriate for this patient at this time.  The patient did not have access to video technology/had technical difficulties with video requiring transitioning to audio format only (telephone).  All issues noted in this document were discussed and addressed.  No physical exam could be performed with this format.  Please refer to the patient's chart for his  consent to telehealth for [facility].   Date:  08/27/2018   ID:  William [name]., DOB 31-Mar-1941, MRN [id number]  Patient Location: Home Provider Location: Home  PCP:  Doree Albee, MD  Cardiologist:  Kate Sable, MD  Electrophysiologist:  None   Evaluation Performed:  Follow-Up Visit  Chief Complaint:  CAD  History of Present Illness:    William [name]. is a 78 y.o. male with a history of CAD.   He was hospitalized in February 2017 with a non-STEMI. Coronary angiography demonstrated critical left main obstruction and thus underwent a LIMA to the LAD and saphenous vein graft to the first obtuse marginal branch. Additional past medical history includes hypertension, hyperlipidemia, COPD, tobacco abuse, and peripheral vascular disease with a history of right external iliac artery stentingand bilateral carotid artery stenosis.  Echocardiogram on 05/19/15 demonstrated normal left ventricular systolic function with ischemic wall motion abnormalities, EF 50-55%, mild LVH, grade 1 diastolic dysfunction, and mild to moderate aortic regurgitation.  He tries to wear CPAP all night and when he does, he is less  fatigued. He felt fatigued even after CABG.  He only has chest pain if he overdoes it.  The patient does not have symptoms concerning for COVID-19 infection (fever, chills, cough, or new shortness of breath).    Past Medical History:  Diagnosis Date  . Carotid artery occlusion   . Colon polyp   . COPD (chronic obstructive pulmonary disease) ([place])   . Coronary artery disease   . Hyperlipidemia   . Hypertension   . 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 Myocardial infarction ([place])   . Peripheral arterial disease [facility])    Past Surgical History:  Procedure Laterality Date  . ABDOMINAL AORTOGRAM W/LOWER EXTREMITY N/A 08/21/2017   Procedure: ABDOMINAL AORTOGRAM W/LOWER EXTREMITY;  Surgeon: Angelia Mould, MD;  Location: [facility];  Service: Cardiovascular;  Laterality: N/A;  . Bilateral renal  artery stenoses  04/23/2009  . CARDIAC CATHETERIZATION N/A 05/21/2015   Procedure: Left Heart Cath and Coronary Angiography;  Surgeon: Lorretta Harp, MD;  Location: [facility];  Service: Cardiovascular;  Laterality: N/A;  . CORONARY ARTERY BYPASS GRAFT N/A 05/22/2015   Procedure: CORONARY ARTERY BYPASS GRAFTING (CABG) LIMA-LAD and SVG-OM EVH RIGHT THIGH GREATER SAPHENOUS VEIN;  Surgeon: [name], MD;  Location: [facility];  Service: Open Heart Surgery;  Laterality: N/A;  . HIATAL HERNIA REPAIR    . Percutaneous translumnial angioplasty  04/23/2009   Catheterization of Lefst external iliac artery with Left lower extremity runoff  . TEE WITHOUT CARDIOVERSION N/A 05/22/2015   Procedure: TRANSESOPHAGEAL ECHOCARDIOGRAM (TEE);  Surgeon: [name], MD;  Location: [facility];  Service: Open Heart Surgery;  Laterality: N/A;  Current Meds  Medication Sig  . amLODipine (NORVASC) 10 MG tablet Take 1 tablet (10 mg total) by mouth daily. (Patient taking differently: Take 10 mg by mouth daily after supper. )  . Ascorbic Acid (VITAMIN C) 1000 MG tablet Take 1,000 mg by mouth daily.  Marland Kitchen  aspirin EC 81 MG tablet Take 1 tablet (81 mg total) by mouth daily.  . fish oil-omega-3 fatty acids 1000 MG capsule Take 1,000 g by mouth daily.   . folic acid (FOLVITE) 932 MCG tablet Take 400 mcg by mouth daily.  Marland Kitchen gabapentin (NEURONTIN) 300 MG capsule Take 300 mg by mouth at bedtime.   . Garlic 6712 MG TABS Take 1,250 mg by mouth daily.   Marland Kitchen ibuprofen (ADVIL,MOTRIN) 200 MG tablet Take 400 mg by mouth every 8 (eight) hours as needed (for pain/headaches.).  Marland Kitchen lisinopril (PRINIVIL,ZESTRIL) 20 MG tablet TAKE 1 TABLET BY MOUTH EVERY DAY  . metoprolol tartrate (LOPRESSOR) 25 MG tablet TAKE 1 TABLET BY MOUTH TWICE A DAY  . Misc. Devices MISC by Does not apply route. CPAP  . Multiple Vitamin (MULTIVITAMIN) tablet Take 1 tablet by mouth daily.  . rosuvastatin (CRESTOR) 10 MG tablet Take 1 tablet (10 mg total) by mouth daily.  Marland Kitchen SPIRIVA HANDIHALER 18 MCG inhalation capsule Place 18 mcg into inhaler and inhale daily.   Marland Kitchen thiamine (VITAMIN B-1) 100 MG tablet Take 100 mg by mouth every evening.   . thyroid (ARMOUR) 60 MG tablet Take 60 mg by mouth daily before breakfast.  . vitamin E 400 UNIT capsule Take 400 Units by mouth daily.     Allergies:   Lipitor [atorvastatin] and Zocor [simvastatin]   Social History   Tobacco Use  . Smoking status: Former Smoker    Packs/day: 2.00    Years: 30.00    Pack years: 60.00    Types: Cigarettes    Start date: 06/03/1961    Last attempt to quit: 11/17/1991    Years since quitting: 26.7  . Smokeless tobacco: Former Systems developer    Types: Snuff  Substance Use Topics  . Alcohol use: No    Alcohol/week: 2.0 - 4.0 standard drinks    Types: 2 - 4 Standard drinks or equivalent per week    Comment: used to   . Drug use: No     Family Hx: The patient's family history includes Cancer in his mother; Deep vein thrombosis in his father; Heart disease in his father; Pulmonary embolism in his father.  ROS:   Please see the history of present illness.     All other  systems reviewed and are negative.   Prior CV studies:   The following studies were reviewed today:  Carotid Dopplers (02/03/18):  Right Carotid: Velocities in the right ICA are consistent with a 1-39% stenosis.                The ECA appears >50% stenosed. Unable to visualize the mid                segment of the ICA due to calcified plaque.  Left Carotid: Velocities in the left ICA are consistent with a 1-39% stenosis.               The ECA appears >50% stenosed.  Vertebrals:  Right vertebral artery demonstrates antegrade flow. Left vertebral              artery demonstrates retrograde flow. Subclavians: Normal flow hemodynamics were seen in bilateral subclavian  arteries.  Labs/Other Tests and Data Reviewed:    EKG:  No ECG reviewed.  Recent Labs: No results found for requested labs within last 8760 hours.   Recent Lipid Panel Lab Results  Component Value Date/Time   CHOL 137 05/19/2015 02:24 AM   TRIG 74 05/19/2015 02:24 AM   HDL 33 (L) 05/19/2015 02:24 AM   CHOLHDL 4.2 05/19/2015 02:24 AM   LDLCALC 89 05/19/2015 02:24 AM    Wt Readings from Last 3 Encounters:  08/27/18 158 lb 8 oz (71.9 kg)  02/23/18 157 lb (71.2 kg)  02/03/18 152 lb (68.9 kg)     Objective:    Vital Signs:  BP (!) 146/68   Pulse 76   Temp (!) 97.4 F (36.3 C)   Ht 5\' 6"  (1.676 m)   Wt 158 lb 8 oz (71.9 kg)   BMI 25.58 kg/m    VITAL SIGNS:  reviewed  ASSESSMENT & PLAN:    1. CAD s/p 2-vessel CABG with NSTEMI: Symptomatically stable.Continue ASA,metoprolol, ACEI, and Crestor.  He was not able to tolerate higher dose of statin in the past.  2. 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 He checks it daily and SBP is usually in the 140 range. I will increase lisinopril to 40 mg.  3. Hyperlipidemia: Continue Crestor.  He was not able to tolerate higher dose of statin in the past and has had side effects from Lipitor and Zocor.I will obtain a copy of most recent lipids  from his PCP.  4. PVD/Bilateralcarotid artery stenosis: Follows with vascular surgery.Continue aspirin and statin.  Carotid Doppler from 02/03/2018 reviewed above.  5.  OSA: On CPAP. Encouraged to wear it all night.    COVID-19 Education: The signs and symptoms of COVID-19 were discussed with the patient and how to seek care for testing (follow up with PCP or arrange E-visit).  The importance of social distancing was discussed today.  Time:   Today, I have spent 15 minutes with the patient with telehealth technology discussing the above problems.     Medication Adjustments/Labs and Tests Ordered: Current medicines are reviewed at length with the patient today.  Concerns regarding medicines are outlined above.   Tests Ordered: No orders of the defined types were placed in this encounter.   Medication Changes: No orders of the defined types were placed in this encounter.   Disposition:  Follow up in 6 month(s)  Signed, Kate Sable, MD  08/27/2018 1:55 PM    [facility]

## 2018-08-27 NOTE — Patient Instructions (Signed)
Medication Instructions:   Increase Lisinopril to 40mg  daily - new prescription sent to pharmacy today.   Continue all other medications.    Labwork: none  Testing/Procedures: none  Follow-Up: Your physician wants you to follow up in: 6 months.  You will receive a reminder letter in the mail one-two months in advance.  If you don't receive a letter, please call our office to schedule the follow up appointment   Any Other Special Instructions Will Be Listed Below (If Applicable).  If you need a refill on your cardiac medications before your next appointment, please call your pharmacy.

## 2018-08-27 NOTE — Addendum Note (Signed)
Addended by: Laurine Blazer on: 08/27/2018 02:14 PM   Modules accepted: Orders

## 2018-09-09 DIAGNOSIS — R5383 Other fatigue: Secondary | ICD-10-CM | POA: Diagnosis not present

## 2018-09-09 DIAGNOSIS — E559 Vitamin D deficiency, unspecified: Secondary | ICD-10-CM | POA: Diagnosis not present

## 2018-09-09 DIAGNOSIS — E785 Hyperlipidemia, unspecified: Secondary | ICD-10-CM | POA: Diagnosis not present

## 2018-09-09 DIAGNOSIS — I10 Essential (primary) hypertension: Secondary | ICD-10-CM | POA: Diagnosis not present

## 2018-09-09 DIAGNOSIS — J449 Chronic obstructive pulmonary disease, unspecified: Secondary | ICD-10-CM | POA: Diagnosis not present

## 2018-09-14 ENCOUNTER — Encounter: Payer: Self-pay | Admitting: *Deleted

## 2018-10-13 ENCOUNTER — Other Ambulatory Visit: Payer: Self-pay | Admitting: Cardiovascular Disease

## 2018-11-22 ENCOUNTER — Other Ambulatory Visit: Payer: Self-pay | Admitting: Cardiovascular Disease

## 2019-01-05 ENCOUNTER — Telehealth (INDEPENDENT_AMBULATORY_CARE_PROVIDER_SITE_OTHER): Payer: Self-pay

## 2019-01-06 ENCOUNTER — Other Ambulatory Visit (INDEPENDENT_AMBULATORY_CARE_PROVIDER_SITE_OTHER): Payer: Self-pay | Admitting: Internal Medicine

## 2019-01-06 MED ORDER — AMLODIPINE BESYLATE 10 MG PO TABS: 10.0000 mg | ORAL_TABLET | Freq: Every day | ORAL | 0 refills | Status: DC

## 2019-01-06 NOTE — Telephone Encounter (Signed)
Refill for amlodipine medication.

## 2019-01-11 ENCOUNTER — Telehealth: Payer: Medicare Other | Admitting: Family Medicine

## 2019-01-12 ENCOUNTER — Ambulatory Visit: Payer: Medicare Other | Admitting: Family Medicine

## 2019-01-12 ENCOUNTER — Ambulatory Visit: Payer: Medicare Other | Admitting: Adult Health

## 2019-01-17 ENCOUNTER — Other Ambulatory Visit (INDEPENDENT_AMBULATORY_CARE_PROVIDER_SITE_OTHER): Payer: Self-pay | Admitting: Internal Medicine

## 2019-01-25 ENCOUNTER — Ambulatory Visit (INDEPENDENT_AMBULATORY_CARE_PROVIDER_SITE_OTHER): Payer: Medicare Other | Admitting: Nurse Practitioner

## 2019-01-25 ENCOUNTER — Encounter (INDEPENDENT_AMBULATORY_CARE_PROVIDER_SITE_OTHER): Payer: Self-pay | Admitting: Nurse Practitioner

## 2019-01-25 ENCOUNTER — Other Ambulatory Visit: Payer: Self-pay

## 2019-01-25 VITALS — BP 142/68 | HR 68 | Resp 14 | Ht 67.0 in | Wt 156.6 lb

## 2019-01-25 DIAGNOSIS — J449 Chronic obstructive pulmonary disease, unspecified: Secondary | ICD-10-CM | POA: Insufficient documentation

## 2019-01-25 DIAGNOSIS — Z23 Encounter for immunization: Secondary | ICD-10-CM | POA: Insufficient documentation

## 2019-01-25 DIAGNOSIS — Z139 Encounter for screening, unspecified: Secondary | ICD-10-CM | POA: Insufficient documentation

## 2019-01-25 DIAGNOSIS — Z0001 Encounter for general adult medical examination with abnormal findings: Secondary | ICD-10-CM | POA: Diagnosis not present

## 2019-01-25 DIAGNOSIS — J439 Emphysema, unspecified: Secondary | ICD-10-CM | POA: Insufficient documentation

## 2019-01-25 DIAGNOSIS — H532 Diplopia: Secondary | ICD-10-CM | POA: Insufficient documentation

## 2019-01-25 DIAGNOSIS — Z7189 Other specified counseling: Secondary | ICD-10-CM | POA: Insufficient documentation

## 2019-01-25 DIAGNOSIS — Z0000 Encounter for general adult medical examination without abnormal findings: Secondary | ICD-10-CM | POA: Diagnosis not present

## 2019-01-25 DIAGNOSIS — R9431 Abnormal electrocardiogram [ECG] [EKG]: Secondary | ICD-10-CM | POA: Insufficient documentation

## 2019-01-25 MED ORDER — UMECLIDINIUM BROMIDE 62.5 MCG/INH IN AEPB: 1.0000 | INHALATION_SPRAY | Freq: Every day | RESPIRATORY_TRACT | Status: DC

## 2019-01-25 NOTE — Patient Instructions (Addendum)
Today we completed an Annual Wellness Visit. During this visit, we discussed the following:  Health Maintenance  Topic Date Due  . INFLUENZA VACCINE  10/30/2018  . PNA vac Low Risk Adult (1 of 2 - PCV13) 01/25/2020 (Originally 06/04/2006)  . TETANUS/TDAP  01/06/2028  1.   We administered the flu shot today in the office.  You will be due for flu vaccine again in 1 year 2. Sexually Transmitted Infection Screening: We did not perform this per your request.  If you change your mind I would like to have this screening completed just let our office know. 3. Hepatitis C Screening: We did not perform this screening per your request.  If you change your mind and would like to have the screening completed just let our office know.  Immunization History  Administered Date(s) Administered  . Pneumococcal Polysaccharide-23 01/28/2017  . Tdap 01/05/2018    Diet, Exercise, Safety, Sleep, Substance Use: You identified the following as your personal wellness goals: As wanting to start exercise.  Please start slowly, and only exercise as much as you can tolerate.  Advanced Care Planning: We discussed advanced care planning. Please notify call our office you wish to make any changes regarding what we discussed today.  Follow-up as scheduled in 2 months you will be due for your next Medicare Annual Wellness Visit in 1 year.  As we discussed in the office if you experience any red flag symptoms such as severe chest pain, sudden and severe headache, difficulty talking, difficulty breathing, new onset of weakness, new onset of sensation changes, call 911 or proceed to the emergency department.   Remember to follow-up with your neurologist and cardiologist as scheduled.  Please call this office if you feel that your double vision keeps occurring or worsens between now and her next appointment.

## 2019-01-25 NOTE — Assessment & Plan Note (Signed)
High-dose flu shot administered today.  Patient is up-to-date with pneumonia vaccine and tetanus shot.  He also tells me he believes he is had the shingles vaccine but is not sure the exact date.

## 2019-01-25 NOTE — Progress Notes (Signed)
Subjective:  Patient ID: William [name]., male    DOB: December 14, 1940  Age: 78 y.o. MRN: [id number]  CC: William [name]. presents for a subsequent annual wellness visit. William [name]. rates their health as fair.     HPI The above patient presents for his subsequent annual wellness visit.   In addition he does mention to me that he did have an episode of diplopia approximately 2 weeks ago.  He tells me it occurred while he was driving.  It lasted for about 1 minute and resolved on its own.  He has had this symptom in the past.  He has undergone head MRI which did not show any space occupying lesion.  He is also being followed by neurology and cardiology.  He also has a history of COPD and mentions to me that he is currently in the "donut hole".  Thus his Spiriva has become quite unaffordable.  He is wondering if there are any other alternatives that he can try.  He is considering stopping his Spiriva inhaler due to the high cost currently.  Past Medical History:  Diagnosis Date  . Carotid artery occlusion   . Colon polyp   . COPD (chronic obstructive pulmonary disease) ([place])   . Coronary artery disease   . Hyperlipidemia   . Hypertension   . 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 Myocardial infarction ([place])   . Peripheral arterial disease ([facility])       Family History  Problem Relation Age of Onset  . Heart disease Father        Heart Disease before age 18  . Pulmonary embolism Father   . Deep vein thrombosis Father   . Cancer Mother        Sarcoma    Social History   Social History Narrative   Retired used to work in [employer]. Widower. Lives alone.     Current Meds  Medication Sig  . amLODipine (NORVASC) 10 MG tablet Take 1 tablet (10 mg total) by mouth daily.  . Ascorbic Acid (VITAMIN C) 1000 MG tablet Take 1,000 mg by mouth daily.  Marland Kitchen aspirin EC 81 MG tablet Take 1 tablet (81 mg total) by mouth daily.  . cholecalciferol (VITAMIN D3) 25 MCG (1000 UT)  tablet Take 10,000 Units by mouth daily.  . fish oil-omega-3 fatty acids 1000 MG capsule Take 1,000 g by mouth daily.   . folic acid (FOLVITE) A999333 MCG tablet Take 400 mcg by mouth daily.  . Garlic [id number] MG TABS Take 1,250 mg by mouth daily.   Marland Kitchen ibuprofen (ADVIL,MOTRIN) 200 MG tablet Take 400 mg by mouth every 8 (eight) hours as needed (for pain/headaches.).  Marland Kitchen lisinopril (ZESTRIL) 40 MG tablet Take 1 tablet (40 mg total) by mouth daily.  . metoprolol tartrate (LOPRESSOR) 25 MG tablet TAKE 1 TABLET BY MOUTH TWICE A DAY  . Misc. Devices MISC by Does not apply route. CPAP  . Multiple Vitamin (MULTIVITAMIN) tablet Take 1 tablet by mouth daily.  . NP THYROID 90 MG tablet Take 1 tablet by mouth once daily  . rosuvastatin (CRESTOR) 10 MG tablet TAKE 1 TABLET BY MOUTH EVERY DAY  . SPIRIVA HANDIHALER 18 MCG inhalation capsule Place 18 mcg into inhaler and inhale daily.   Marland Kitchen thiamine (VITAMIN B-1) 100 MG tablet Take 100 mg by mouth every evening.   . vitamin E 400 UNIT capsule Take 400 Units by mouth daily.    Most Recent Screenings/Health  maintenance that apply to Mr Gosch: 1. Colon Cancer Screening: completed 2. Sexually Transmitted Infection Screening: refused 3. Hepatitis C Screening: refused 4. Depression: PHQ9: 5/27 , Due: 12/2019 Safety Screening Do you always wear a seatbelt?: Sometimes Sleep How many hours of sleep do you usually get each night? 6 hours  Do you snore or has anyone told you that you snore?: Yes, used to. On CPAP now. In the past 7 days, how often have you felt sleepy during the daytime?: 7 Fall Risk  01/25/2019 02/23/2018 07/03/2015  Falls in the past year? 0 1 No  Number falls in past yr: 0 0 -  Injury with Fall? 0 1 -  Comment - Patient stated that he hit his head and had a skin tear to his left hand -  Follow up Falls evaluation completed - -     Diet: Typical diet includes: Oatmeal, [place] sandwich with cheese, salad with grilled chicken, steamed vegetables, and  chicken. Will eat red meat sometimes Exercise: Frequency and Duration: None currently. Goals is to restart soon (hopes to do sit-ups, leg raises, and rower machine) Advanced Care Planning Do you have an advanced care plan in place? Has a living will in place  Do you want to make changes to your advanced care plan today?: No   List of Current Medical Providers that William [name]. Regularly Follows-up With: neurologist (Dr. Rexene Alberts), cardiologist (Dr. Bronson Ing), Vascular (Dr. Scot Dock)  Immunization History  Administered Date(s) Administered  . Fluad Quad(high Dose 65+) 01/25/2019  . Pneumococcal Polysaccharide-23 01/28/2017  . Tdap 01/05/2018  . Due for flu shot today.  ROS: Review of Systems  Constitutional: Negative for chills and fever.  Eyes: Positive for double vision (For approximately 1 minute about 2 weeks ago. This has been a chronic intermittent symptom. Being followed by cardiology and neurology). Negative for blurred vision.  Respiratory: Positive for shortness of breath (stable). Negative for cough.   Cardiovascular: Positive for claudication. Negative for chest pain and palpitations.  Neurological: Positive for dizziness.  Psychiatric/Behavioral: Negative for suicidal ideas.     Objective:   Today's Vitals: BP (!) 142/68   Pulse 68   Resp 14   Ht 5\' 7"  (1.702 m)   Wt 156 lb 9.6 oz (71 kg)   SpO2 93%   BMI 24.53 kg/m  Vitals with BMI 01/25/2019 08/27/2018 02/23/2018  Height 5\' 7"  5\' 6"  5\' 6"   Weight 156 lbs 10 oz 158 lbs 8 oz 157 lbs  BMI 24.52 [id number] A999333  Systolic A999333 [id number]  Diastolic 68 68 72  Pulse 68 76 86     Physical Exam Vitals signs reviewed.  Constitutional:      Appearance: Normal appearance.  HENT:     Head: Normocephalic and atraumatic.  Eyes:     Pupils: Pupils are equal, round, and reactive to light.  Neck:     Musculoskeletal: Neck supple.     Vascular: Carotid bruit present.  Cardiovascular:     Rate and Rhythm: Normal rate  and regular rhythm.     Pulses: Normal pulses.     Heart sounds: Normal heart sounds.  Pulmonary:     Effort: Pulmonary effort is normal. No respiratory distress.     Breath sounds: Decreased breath sounds present. No wheezing.  Neurological:     General: No focal deficit present.     Mental Status: He is alert and oriented to person, place, and time.  Psychiatric:  Mood and Affect: Mood normal.        Behavior: Behavior normal.        Judgment: Judgment normal.     PHQ9: 5/27 TUG: 7 seconds MOCA: 21/30 IADLs: 8/8 ADLs: 6/6  EKG: Sinus rhythm with abnormal Q waves in leads III and V1.  I do note a T wave inversion in lead aVL.  EKG was also reviewed with Dr. Anastasio Champion today.    Assessment   1. Medicare annual wellness visit, subsequent   2. Encounter for screening   3. Advanced care planning/counseling discussion   4. Chronic obstructive pulmonary disease, unspecified COPD type ([place])   5. Need for immunization against influenza   6. Diplopia   7. Abnormal EKG   8. Needs flu shot       Tests ordered Orders Placed This Encounter  Procedures  . Flu Vaccine QUAD High Dose(Fluad)  . EKG 12-Lead     Plan: See assessment and plan under problems list as written below  Meds ordered this encounter  Medications  . DISCONTD: umeclidinium bromide (INCRUSE ELLIPTA) 62.5 MCG/INH 1 puff   Patient to follow-up in 2 months, but was encouraged to call the office with any questions or concerns prior to that upcoming appointment.  He will also be due to follow-up for annual wellness Medicare visit in approximately 1 year.  In addition to performing an annual wellness Medicare visit, I also performed an office visit to address his questions and concerns related to his COPD and diplopia  Ailene Ards, NP

## 2019-01-25 NOTE — Assessment & Plan Note (Signed)
He is currently up-to-date with all screenings that he is willing to undergo at this time.  He will follow-up for next annual wellness visit in approximately 1 year.

## 2019-01-25 NOTE — Assessment & Plan Note (Signed)
This is a recurrent symptom.  He has undergone multiple evaluations for this including MRI of the head, Doppler ultrasounds of the carotid arteries, and continues to follow-up with neurology and cardiology.  EKG done today does not show any rhythm abnormalities.  He was encouraged to follow-up with his cardiologist and neurologist as scheduled, and to notify this office if he experiences any changes to these episodes or if they occur more frequently.

## 2019-01-25 NOTE — Assessment & Plan Note (Signed)
His COPD appears to be stable at this time.  I will send prescription for Incruse Ellipta inhaler that he can take in place of his Spiriva if this is more affordable for him.  I did caution him against stopping his inhaler altogether.  I did tell him if he were to do this that he needs to monitor himself for worsening shortness of breath and cough.  He tells me he understands.

## 2019-01-25 NOTE — Assessment & Plan Note (Signed)
We discussed advanced care planning today.  He does have a living will in place and is not interested in making changes at this time.

## 2019-01-25 NOTE — Assessment & Plan Note (Signed)
Today's EKG was compared with EKG completed at cardiologist office in March 2019.  It is mostly unchanged, however there is a new T wave inversion in lead aVL.  The patient is not having any symptoms associated with acute coronary syndrome at this time.  I did encourage him to follow-up with cardiology as scheduled.  Per chart review he should be scheduling an appointment for next month.  I told him that if he does not get a reminder letter from his cardiologist within the next few weeks to schedule this appointment that he should call the office to schedule a follow-up appointment.  He tells me he understands.  In addition we went over red flag symptoms such as severe chest pain, difficulty breathing, shortness of breath, etc.  I told him if he were to experience any of these symptoms he needs to call 911 and proceed to the emergency department.  He tells me he understands.

## 2019-01-25 NOTE — Assessment & Plan Note (Signed)
He did receive his flu shot today.  He is up-to-date with pneumonia vaccine and tetanus shot.  He tells me he is also had a shingles vaccine in the past but is not sure of the exact date.  He is up-to-date with all health maintenance screenings that he is willing to undergo at this time.  We also discussed advanced care planning and he tells me he does have a living will in place and is not interested in making changes to it at this time.  He will be due for his next annual wellness visit in approximately 1 year.

## 2019-01-26 ENCOUNTER — Telehealth (INDEPENDENT_AMBULATORY_CARE_PROVIDER_SITE_OTHER): Payer: Self-pay | Admitting: Nurse Practitioner

## 2019-01-26 ENCOUNTER — Other Ambulatory Visit (INDEPENDENT_AMBULATORY_CARE_PROVIDER_SITE_OTHER): Payer: Self-pay | Admitting: Nurse Practitioner

## 2019-01-26 NOTE — Telephone Encounter (Signed)
I called this patient today after discussing other options to help patients afford their medications when they are in the "donut hole" of Medicare with our [employer] representative.  There is an option to prescribe Yupelri nebulized that patient can take in place of his Spiriva until the start of next year.  This would most likely be a more affordable option for him.  I did call the patient and he is interested in possibly having this prescribed but would like to think about this.  I encouraged him to let me know and if he would I would set him up with home health so that he could get a nebulizer machine and would prescribe him we will Maretta Bees He tells me he understands.

## 2019-02-23 ENCOUNTER — Other Ambulatory Visit: Payer: Self-pay | Admitting: Cardiovascular Disease

## 2019-03-14 ENCOUNTER — Other Ambulatory Visit: Payer: Self-pay

## 2019-03-14 DIAGNOSIS — I739 Peripheral vascular disease, unspecified: Secondary | ICD-10-CM

## 2019-03-14 DIAGNOSIS — I6523 Occlusion and stenosis of bilateral carotid arteries: Secondary | ICD-10-CM

## 2019-03-15 ENCOUNTER — Ambulatory Visit (INDEPENDENT_AMBULATORY_CARE_PROVIDER_SITE_OTHER)
Admission: RE | Admit: 2019-03-15 | Discharge: 2019-03-15 | Disposition: A | Discharge status: Home / Self Care | Payer: Medicare Other | Source: Ambulatory Visit | Attending: Family | Admitting: Family

## 2019-03-15 ENCOUNTER — Ambulatory Visit (HOSPITAL_COMMUNITY)
Admission: RE | Admit: 2019-03-15 | Discharge: 2019-03-15 | Disposition: A | Discharge status: Home / Self Care | Payer: Medicare Other | Source: Ambulatory Visit | Attending: Family | Admitting: Family

## 2019-03-15 ENCOUNTER — Ambulatory Visit: Payer: Medicare Other | Admitting: Vascular Surgery

## 2019-03-15 ENCOUNTER — Other Ambulatory Visit: Payer: Self-pay

## 2019-03-15 DIAGNOSIS — I6523 Occlusion and stenosis of bilateral carotid arteries: Secondary | ICD-10-CM | POA: Diagnosis not present

## 2019-03-15 DIAGNOSIS — I739 Peripheral vascular disease, unspecified: Secondary | ICD-10-CM | POA: Insufficient documentation

## 2019-03-16 ENCOUNTER — Ambulatory Visit (INDEPENDENT_AMBULATORY_CARE_PROVIDER_SITE_OTHER): Payer: Medicare Other | Admitting: Vascular Surgery

## 2019-03-16 ENCOUNTER — Encounter: Payer: Self-pay | Admitting: Vascular Surgery

## 2019-03-16 VITALS — BP 139/78 | HR 64 | Temp 97.1°F | Wt 159.0 lb

## 2019-03-16 DIAGNOSIS — I6523 Occlusion and stenosis of bilateral carotid arteries: Secondary | ICD-10-CM | POA: Diagnosis not present

## 2019-03-16 DIAGNOSIS — I739 Peripheral vascular disease, unspecified: Secondary | ICD-10-CM

## 2019-03-16 DIAGNOSIS — I771 Stricture of artery: Secondary | ICD-10-CM

## 2019-03-16 NOTE — Progress Notes (Signed)
Patient name: Jurgen Satkowiak. DOB: 01-25-1941 Sex: male    Referring Provider is Doree Albee, MD  PCP is Doree Albee, MD  REASON FOR VIRTUAL VISIT: Follow-up of cerebrovascular disease and peripheral vascular disease.  I connected with [name] on 03/16/19 at 10:40 AM EST by a video enabled telemedicine application and verified that I am speaking with the correct person using two identifiers. I discussed the limitations of evaluation and management by telemedicine and the availability of in person appointments. The patient expressed understanding and agreed to proceed.  Location: Patient: Home Provider: Office  HPI: Franklyn Barsh. is a 78 y.o. male who I have been following with peripheral vascular disease.  In May 2019 he underwent an arteriogram as he was having claudication.  At that time he had no rest pain or nonhealing ulcers.  He had multilevel arterial occlusive disease.  There were no good options from an endovascular standpoint and I felt that we would only consider right axillobifemoral bypass grafting and right femoral endarterectomy if he developed critical limb ischemia.  He also had a known left 40 to 59% carotid stenosis.  When I saw him subsequently in November 2019 the carotid stenosis had improved and he had a less than 39% carotid stenosis bilaterally.  I recommended a follow-up duplex scan in 1 year.  His peripheral vascular disease was stable and I set up a follow-up Doppler study in 1 year.  This was done yesterday and today we are discussing these results given the Covid situation.  With respect to his peripheral vascular disease he does continue to have bilateral calf claudication.  The symptoms are brought on by ambulation and relieved with rest.  He also experiences pain in his legs with standing that involves his entire legs and this is likely related to venous hypertension as he spends a lot of time standing on concrete.  He specifically  denies any history of rest pain or nonhealing ulcers.  He feels like his symptoms have been stable over the last year.  He is not a smoker.  With respect to his cerebrovascular disease, he denies any history of stroke, TIAs, expressive or receptive aphasia, or amaurosis fugax.  He does occasionally get some double vision that is sometimes superseded by dizziness.  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  Current Outpatient Medications  Medication Sig Dispense Refill  . amLODipine (NORVASC) 10 MG tablet Take 1 tablet (10 mg total) by mouth daily. 90 tablet 0  . Ascorbic Acid (VITAMIN C) 1000 MG tablet Take 1,000 mg by mouth daily.    Marland Kitchen aspirin EC 81 MG tablet Take 1 tablet (81 mg total) by mouth daily.    . cholecalciferol (VITAMIN D3) 25 MCG (1000 UT) tablet Take 10,000 Units by mouth daily.    . fish oil-omega-3 fatty acids 1000 MG capsule Take 1,000 g by mouth daily.     . folic acid (FOLVITE) A999333 MCG tablet Take 400 mcg by mouth daily.    . Garlic [id number] MG TABS Take 1,250 mg by mouth daily.     Marland Kitchen ibuprofen (ADVIL,MOTRIN) 200 MG tablet Take 400 mg by mouth every 8 (eight) hours as needed (for pain/headaches.).    Marland Kitchen lisinopril (ZESTRIL) 40 MG tablet Take 1 tablet (40 mg total) by mouth daily.  30 tablet 6  . metoprolol tartrate (LOPRESSOR) 25 MG tablet TAKE 1 TABLET BY MOUTH TWICE A DAY 180 tablet 0  . Misc. Devices MISC by Does not apply route. CPAP    . Multiple Vitamin (MULTIVITAMIN) tablet Take 1 tablet by mouth daily.    . NP THYROID 90 MG tablet Take 1 tablet by mouth once daily 30 tablet 3  . rosuvastatin (CRESTOR) 10 MG tablet TAKE 1 TABLET BY MOUTH EVERY DAY 90 tablet 1  . SPIRIVA HANDIHALER 18 MCG inhalation capsule Place 18 mcg into inhaler and inhale daily.   8  . thiamine (VITAMIN B-1) 100 MG tablet Take 100 mg by mouth every evening.     .  vitamin E 400 UNIT capsule Take 400 Units by mouth daily.     No current facility-administered medications for this visit.   REVIEW OF SYSTEMS: Valu.Nieves ] denotes positive finding; [  ] denotes negative finding  CARDIOVASCULAR:  [ ]  chest pain   [ ]  dyspnea on exertion  [ ]  leg swelling  CONSTITUTIONAL:  [ ]  fever   [ ]  chills   OBSERVATIONS/OBJECTIVE: Vitals:   03/16/19 1024  BP: 139/78  Pulse: 64  Temp: (!) 97.1 F (36.2 C)  Weight: 159 lb (72.1 kg)   GENERAL: The patient was alert and oriented on the phone.  He was not short of breath at rest.   DATA:  CAROTID DUPLEX: I have reviewed the carotid duplex scan that was done yesterday.  On the right side there is no significant carotid stenosis.  The right vertebral artery is patent with antegrade flow.  On the left side there is no significant carotid stenosis.  The left vertebral artery has retrograde flow.  There was evidence of stenosis in the left subclavian artery.  ARTERIAL DOPPLER STUDY: I reviewed the arterial Doppler study that was done yesterday.  On the right side there is a monophasic dorsalis pedis and posterior tibial signal.  ABI is 31%.  Toe pressure cannot be obtained.  On the left side there is a monophasic dorsalis pedis and posterior tibial signal.  ABIs 52%.  Toe pressures 45 mmHg.  MEDICAL ISSUES:  PERIPHERAL VASCULAR DISEASE: The patient does have multilevel arterial occlusive disease based on his previous arteriogram in 2019.  However he has stable claudication and no evidence of critical limb ischemia.  Certainly I would favor conservative treatment given his age and I have encouraged him to stay as active as possible.  Fortunately he is not a smoker.  He understands we would not consider intervention which would likely require a bypass if he developed rest pain or nonhealing ulcer.  Based on his previous arteriogram he did not appear to be a good candidate for endovascular approach.  I have ordered follow-up  ABIs in 1 year and I will see him back at that time.  He knows to call sooner if he has problems.  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  However, I do not get any symptoms of dizziness related to use of the left arm.  I have ordered a follow-up carotid duplex scan in 1 year and I will see him back at that time.  If this symptoms change then certainly he will let us know.  FOLLOW UP INSTRUCTIONS:   I discussed the assessment and treatment plan with the patient. The patient was  provided an opportunity to ask questions and all were answered. The patient agreed with the plan and demonstrated an understanding of the instructions. The patient was advised to call back or seek an in-person evaluation if the symptoms worsen or if the condition fails to improve as anticipated.  I provided 24 minutes of non-face-to-face time during this encounter.  [name] [facility]

## 2019-03-21 ENCOUNTER — Other Ambulatory Visit: Payer: Self-pay

## 2019-03-21 ENCOUNTER — Ambulatory Visit (INDEPENDENT_AMBULATORY_CARE_PROVIDER_SITE_OTHER): Payer: Medicare Other | Admitting: Family Medicine

## 2019-03-21 ENCOUNTER — Encounter: Payer: Self-pay | Admitting: Family Medicine

## 2019-03-21 VITALS — BP 182/74 | HR 75 | Temp 97.5°F | Ht 66.0 in | Wt 164.6 lb

## 2019-03-21 DIAGNOSIS — Z9989 Dependence on other enabling machines and devices: Secondary | ICD-10-CM | POA: Diagnosis not present

## 2019-03-21 DIAGNOSIS — G4733 Obstructive sleep apnea (adult) (pediatric): Secondary | ICD-10-CM | POA: Diagnosis not present

## 2019-03-21 DIAGNOSIS — I779 Disorder of arteries and arterioles, unspecified: Secondary | ICD-10-CM

## 2019-03-21 DIAGNOSIS — I10 Essential (primary) hypertension: Secondary | ICD-10-CM | POA: Diagnosis not present

## 2019-03-21 NOTE — Progress Notes (Addendum)
PATIENT: William [name]. DOB: 03-06-1941 d REASON FOR VISIT: follow up HISTORY FROM: patient  Chief Complaint  Patient presents with  . Follow-up    RM2, alone. CPAP works well, cannot sleep all night. Wakes up 3 or 4 every morning. States that he doesnt get sleepy during the day.     HISTORY OF PRESENT ILLNESS: Today 03/21/19 William [name]. is a 78 y.o. male here today for follow up for OSA on CPAP. He reports that he is doing very well with CPAP therapy. He is using CPAP nightly. He does have a full mustache and beard. He feels that this is why he continues to note a leak. He feels that he has his mask and headgear as tight as he can tolerate.  He does note that he wakes up most mornings around 3:57 AM.  He states that he can sit on the side of the bed for a few minutes and drifts right back off to sleep.  He denies any concerns of nocturia.  He has no trouble with insomnia.  He feels well rested when he wakes.  Compliance report dated 02/15/2019 through 03/16/2019 reveals that he has used CPAP 30 out of the last 30 days for compliance of 100%.  29 of the last 30 days he used CPAP greater than 4 hours for compliance of 97%.  Average usage was 7 hours and 23 minutes.  Residual AHI was 1.6 on a set pressure of 10 cm of water and an EPR of 1 leak noted in the 95th percentile of 43.1.   HISTORY: (copied from Dr Guadelupe Sabin note on 07/13/2018)  Mr. Lienhard is a 78 year old right-handed gentleman with an underlyingcomplexmedical history of peripheral vascular disease with status post iliac stenting, coronary artery disease with status post CABG in 2017, hypertension, vitamin D deficiency, COPD, history of non-STEMI, hyperlipidemia, carotid artery disease, and overweight state, with whom I am conducting a virtual, phone based follow-up appointment today in lieu of a face-to-face visit for follow-up consultation of his obstructive sleep apnea after interim sleep study testing and starting CPAP  therapy. The patient is unaccompanied today and joins from home. I first met him on 02/23/2018 at the request of his primary care physician, Dr. Anastasio Champion, at which time he reported snoring and excessive daytime somnolence. He was advised to proceed with sleep study testing. I went over his test results with him today. He had a baseline sleep study on 04/05/2018 which showed a sleep latency delayed, REM latency 56 minutes, sleep efficiency reduced at 50.6%, he had absence of slow-wave sleep and REM sleep was 17.8%, total AHI 24.8 per hour, supine AHI 60.4 per hour and REM AHI 52.5 per hour. Average oxygen saturation 92%, nadir of 81%. He had severe PLMS with mild arousals. He was invited for a CPAP titration study. This was on 04/21/2018. Sleep efficiency 57.6%, sleep latency delayed, REM latency 140 minutes. He had near absence of slow-wave sleep and REM sleep was normal at 22.7%. CPAP was titrated from 5 cm to 9 cm. On the final pressure his AHI was 0.5 per hour, O2 nadir of 89% with supine REM sleep achieved. Based on his test results I prescribed CPAP therapy for home use at a pressure of 10 cm.  He also had an interim brain MRI in December 2019 because of his report of intermittent diplopia. He had the study in [place], [place], brain MRI with and without contrast showed periventricular white matter changes, no abnormal  enhancement.  Today, 07/13/2018: Please also see below for virtual visit documentation.  I reviewed his CPAP compliance data from 06/12/2018 through 07/11/2018 which is a total of 30 days, during which time he used his machine every night with percent used days greater than 4 hours at 90%, indicating excellent compliance with an average usage of 5 hours and 39 minutes, residual AHI at goal at 2.3 per hour, leak high with the 95th percentile at 42.2 L/m on a pressure of 10 cm.  Previously:  02/23/2018: (He) reports snoring and excessive daytime somnolence. I reviewed your office  note from 01/05/2018, when he saw Jeralyn Ruths, nurse practitioner. His Epworth sleepiness score is 8 out of 24 today, fatigue score is 32 out of 63. He is widowed and lives alone, he has 2 children. 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 He drinks caffeine in the form of coffee, 2 per day on average. He had a BMP checked at your office on 01/19/2018 and I reviewed the results: BUN was 26, creatinine 0.94. He is trying to hydrate better. He reports a history of double vision for the past 5 years, symptoms have been infrequent, very brief, less than a minute at a time but have increased in frequency, maybe averaging once a month whereas he may have had double vision once every few months in the past. He has seen ophthalmology for this. He denies any one-sided weakness or numbness or stroke like symptoms in the past. He is followed by cardiology. He was encouraged to consider a sleep study by his cardiologist as well in the past. He has never had a sleep study. He does snore loudly. He is not aware of any family history of OSA. His bedtime is around 8:30 and rise time around 3:30. He has nocturia about twice per average night and denies morning headaches.   REVIEW OF SYSTEMS: Out of a complete 14 system review of symptoms, the patient complains only of the following symptoms, none and all other reviewed systems are negative.  Epworth sleepiness scale is 0  ALLERGIES: No Known Allergies  HOME MEDICATIONS: Outpatient Medications Prior to Visit  Medication Sig Dispense Refill  . amLODipine (NORVASC) 10 MG tablet Take 1 tablet (10 mg total) by mouth daily. 90 tablet 0  . Ascorbic Acid (VITAMIN C) 1000 MG tablet Take 1,000 mg by mouth daily.    Marland Kitchen aspirin EC 81 MG tablet Take 1 tablet (81 mg total) by mouth daily.    . cholecalciferol (VITAMIN D3) 25 MCG  (1000 UT) tablet Take 10,000 Units by mouth daily.    . fish oil-omega-3 fatty acids 1000 MG capsule Take 1,000 g by mouth daily.     . folic acid (FOLVITE) 751 MCG tablet Take 400 mcg by mouth daily.    . Garlic 7001 MG TABS Take 1,250 mg by mouth daily.     Marland Kitchen ibuprofen (ADVIL,MOTRIN) 200 MG tablet Take 400 mg by mouth every 8 (eight) hours as needed (for pain/headaches.).    Marland Kitchen lisinopril (ZESTRIL) 40 MG tablet Take 1 tablet (40 mg total) by mouth daily. 30 tablet 6  . metoprolol tartrate (LOPRESSOR) 25 MG tablet TAKE 1 TABLET BY MOUTH TWICE A DAY 180 tablet 0  . Misc. Devices MISC by Does not apply route. CPAP    .  Multiple Vitamin (MULTIVITAMIN) tablet Take 1 tablet by mouth daily.    . NP THYROID 90 MG tablet Take 1 tablet by mouth once daily 30 tablet 3  . rosuvastatin (CRESTOR) 10 MG tablet TAKE 1 TABLET BY MOUTH EVERY DAY 90 tablet 1  . SPIRIVA HANDIHALER 18 MCG inhalation capsule Place 18 mcg into inhaler and inhale daily.   8  . thiamine (VITAMIN B-1) 100 MG tablet Take 100 mg by mouth every evening.     . vitamin E 400 UNIT capsule Take 400 Units by mouth daily.     No facility-administered medications prior to visit.    PAST MEDICAL HISTORY: Past Medical History:  Diagnosis Date  . Carotid artery occlusion   . Colon polyp   . COPD (chronic obstructive pulmonary disease) ([place])   . Coronary artery disease   . Hyperlipidemia   . Hypertension   . 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 Myocardial infarction ([place])   . Peripheral arterial disease ([place])     PAST SURGICAL HISTORY: Past Surgical History:  Procedure Laterality Date  . ABDOMINAL AORTOGRAM W/LOWER EXTREMITY N/A 08/21/2017   Procedure: ABDOMINAL AORTOGRAM W/LOWER EXTREMITY;  Surgeon: Angelia Mould, MD;  Location: [facility];  Service: Cardiovascular;  Laterality: N/A;  . Bilateral renal  artery stenoses  04/23/2009  . CARDIAC CATHETERIZATION N/A 05/21/2015   Procedure: Left Heart Cath and Coronary  Angiography;  Surgeon: Lorretta Harp, MD;  Location: [facility];  Service: Cardiovascular;  Laterality: N/A;  . CORONARY ARTERY BYPASS GRAFT N/A 05/22/2015   Procedure: CORONARY ARTERY BYPASS GRAFTING (CABG) LIMA-LAD and SVG-OM EVH RIGHT THIGH GREATER SAPHENOUS VEIN;  Surgeon: [name], MD;  Location: [facility];  Service: Open Heart Surgery;  Laterality: N/A;  . HIATAL HERNIA REPAIR    . Percutaneous translumnial angioplasty  04/23/2009   Catheterization of [place]efst external iliac artery with Left lower extremity runoff  . TEE WITHOUT CARDIOVERSION N/A 05/22/2015   Procedure: TRANSESOPHAGEAL ECHOCARDIOGRAM (TEE);  Surgeon: [name], MD;  Location: [facility];  Service: Open Heart Surgery;  Laterality: N/A;    FAMILY HISTORY: Family History  Problem Relation Age of Onset  . Heart disease Father        Heart Disease before age 75  . Pulmonary embolism Father   . Deep vein thrombosis Father   . Cancer Mother        Sarcoma    SOCIAL HISTORY: Social History   Socioeconomic History  . Marital status: Married    Spouse name: Not on file  . Number of children: Not on file  . Years of education: Not on file  . Highest education level: Not on file  Occupational History  . Not on file  Tobacco Use  . Smoking status: Former Smoker    Packs/day: 2.00    Years: 30.00    Pack years: 60.00    Types: Cigarettes    Start date: 06/03/1961    Quit date: 11/17/1991    Years since quitting: 27.3  . Smokeless tobacco: Former Systems developer    Types: Snuff  Substance and Sexual Activity  . Alcohol use: Not Currently    Alcohol/week: 2.0 - 4.0 standard drinks    Types: 2 - 4 Standard drinks or equivalent per week    Comment: used to   . Drug use: No  . Sexual activity: Not Currently  Other Topics Concern  . Not on file  Social History Narrative   Retired  used to work in [employer]. Widower. Lives alone.   Social Determinants of Health   Financial Resource Strain:   . Difficulty  of Paying Living Expenses: Not on file  Food Insecurity:   . Worried About Charity fundraiser in the Last Year: Not on file  . Ran Out of Food in the Last Year: Not on file  Transportation Needs:   . Lack of Transportation (Medical): Not on file  . Lack of Transportation (Non-Medical): Not on file  Physical Activity:   . Days of Exercise per Week: Not on file  . Minutes of Exercise per Session: Not on file  Stress:   . Feeling of Stress : Not on file  Social Connections:   . Frequency of Communication with Friends and Family: Not on file  . Frequency of Social Gatherings with Friends and Family: Not on file  . Attends Religious Services: Not on file  . Active Member of Clubs or Organizations: Not on file  . Attends Archivist Meetings: Not on file  . Marital Status: Not on file  Intimate Partner Violence:   . Fear of Current or Ex-Partner: Not on file  . Emotionally Abused: Not on file  . Physically Abused: Not on file  . Sexually Abused: Not on file      PHYSICAL EXAM  Vitals:   03/21/19 0942  BP: (!) 182/74  Pulse: 75  Temp: (!) 97.5 F (36.4 C)  Weight: 164 lb 9.6 oz (74.7 kg)  Height: '5\' 6"'$  (1.676 m)   Body mass index is 26.57 kg/m.  Generalized: Well developed, in no acute distress  Cardiology: normal rate and rhythm, no murmur noted Respiratory: Clear to auscultation bilaterally Neurological examination  Mentation: Alert oriented to time, place, history taking. Follows all commands speech and language fluent Cranial nerve II-XII: Pupils were equal round reactive to light. Extraocular movements were full, visual field were full on confrontational test. Facial sensation and strength were normal. Uvula tongue midline. Head turning and shoulder shrug  were normal and symmetric. Motor: The motor testing reveals 5 over 5 strength of all 4 extremities. Good symmetric motor tone is noted throughout.  Sensory: Sensory testing is intact to soft touch on all 4  extremities. No evidence of extinction is noted.  Coordination: Cerebellar testing reveals good finger-nose-finger and heel-to-shin bilaterally.  Gait and station: Gait is normal.    DIAGNOSTIC DATA (LABS, IMAGING, TESTING) - I reviewed patient records, labs, notes, testing and imaging myself where available.  No flowsheet data found.   Lab Results  Component Value Date   WBC 8.8 05/26/2015   HGB 16.0 08/21/2017   HCT 47.0 08/21/2017   MCV 92.3 05/26/2015   PLT 288 05/26/2015      Component Value Date/Time   NA 139 08/21/2017 0604   K 4.1 08/21/2017 0604   CL 104 08/21/2017 0604   CO2 27 05/29/2015 0520   GLUCOSE 105 (H) 08/21/2017 0604   BUN 27 (H) 08/21/2017 0604   CREATININE 1.00 08/21/2017 0604   CALCIUM 8.9 05/29/2015 0520   PROT 5.9 (L) 05/19/2015 0224   ALBUMIN 3.5 05/19/2015 0224   AST 103 (H) 05/19/2015 0224   ALT 23 05/19/2015 0224   ALKPHOS 45 05/19/2015 0224   BILITOT 0.7 05/19/2015 0224   GFRNONAA >60 05/29/2015 0520   GFRAA >60 05/29/2015 0520   Lab Results  Component Value Date   CHOL 137 05/19/2015   HDL 33 (L) 05/19/2015   LDLCALC 89 05/19/2015  TRIG 74 05/19/2015   CHOLHDL 4.2 05/19/2015   Lab Results  Component Value Date   HGBA1C 6.0 (H) 05/21/2015   No results found for: VITAMINB12 Lab Results  Component Value Date   TSH 2.894 05/19/2015    ASSESSMENT AND PLAN 78 y.o. 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 here with     ICD-10-CM   1. OSA on CPAP  G47.33    Z99.89   2. Benign essential hypertension  I10      Mr. Jeschke is doing well with CPAP therapy.  Compliance report reveals excellent compliance.  He was encouraged to continue using CPAP nightly and for greater than 4 hours each night.   We have discussed continued concerns of a leak with his mask.  He does have facial hair that is most likely contributing to this.  He feels that the nasal pillow and headgear are well fitting and he does to consider a different mask at this time.  We have also discussed trying a chinstrap.  He does not feel that he will be able to comply with this.  He will continue to monitor leak on his CPAP machine at home.  He was instructed to look for the green smiley face as a guide to correct fit.  He verbalizes understanding. His blood pressure is elevated in the office today.  He reports that his normal blood pressure readings at home are between 130-140/70-80.  Today his BP was 139/70 at home. He has follow up with PCP in 2 weeks.  He is taking lisinopril and metoprolol as prescribed.  He feels that his reading today is related to drinking coffee and that he is in a medical office.  He was advised to keep a close eye on his blood pressures at home.  He denies any symptoms today, specifically headache, chest pain, shortness of breath.  He will follow-up with me in 6 to 12 months.  He verbalizes agreement and understanding with this plan.   No orders of the defined types were placed in this encounter.    No orders of the defined types were placed in this encounter.        Debbora Presto, FNP-C 03/21/2019, 11:25 AM [facility] [address] [place], [place] [address] [phone]  I reviewed the above note and documentation by the Nurse Practitioner and agree with the history, exam, assessment and plan as outlined above. I was available for consultation. Star Age, MD, PhD [facility])

## 2019-03-21 NOTE — Patient Instructions (Signed)
Continue CPAP nightly and for greater than 4 hours each night  Please continue to look for leak, make sure that mask and headgear are well fitting. Look for green smiley face on CPAP machine.   Follow up in 6 months, sooner if needed  Sleep Apnea Sleep apnea affects breathing during sleep. It causes breathing to stop for a short time or to become shallow. It can also increase the risk of:  Heart attack.  Stroke.  Being very overweight (obese).  Diabetes.  Heart failure.  Irregular heartbeat. The goal of treatment is to help you breathe normally again. What are the causes? There are three kinds of sleep apnea:  Obstructive sleep apnea. This is caused by a blocked or collapsed airway.  Central sleep apnea. This happens when the brain does not send the right signals to the muscles that control breathing.  Mixed sleep apnea. This is a combination of obstructive and central sleep apnea. The most common cause of this condition is a collapsed or blocked airway. This can happen if:  Your throat muscles are too relaxed.  Your tongue and tonsils are too large.  You are overweight.  Your airway is too small. What increases the risk?  Being overweight.  Smoking.  Having a small airway.  Being older.  Being male.  Drinking alcohol.  Taking medicines to calm yourself (sedatives or tranquilizers).  Having family members with the condition. What are the signs or symptoms?  Trouble staying asleep.  Being sleepy or tired during the day.  Getting angry a lot.  Loud snoring.  Headaches in the morning.  Not being able to focus your mind (concentrate).  Forgetting things.  Less interest in sex.  Mood swings.  Personality changes.  Feelings of sadness (depression).  Waking up a lot during the night to pee (urinate).  Dry mouth.  Sore throat. How is this diagnosed?  Your medical history.  A physical exam.  A test that is done when you are sleeping  (sleep study). The test is most often done in a sleep lab but may also be done at home. How is this treated?   Sleeping on your side.  Using a medicine to get rid of mucus in your nose (decongestant).  Avoiding the use of alcohol, medicines to help you relax, or certain pain medicines (narcotics).  Losing weight, if needed.  Changing your diet.  Not smoking.  Using a machine to open your airway while you sleep, such as: ? An oral appliance. This is a mouthpiece that shifts your lower jaw forward. ? A CPAP device. This device blows air through a mask when you breathe out (exhale). ? An EPAP device. This has valves that you put in each nostril. ? A BPAP device. This device blows air through a mask when you breathe in (inhale) and breathe out.  Having surgery if other treatments do not work. It is important to get treatment for sleep apnea. Without treatment, it can lead to:  High blood pressure.  Coronary artery disease.  In men, not being able to have an erection (impotence).  Reduced thinking ability. Follow these instructions at home: Lifestyle  Make changes that your doctor recommends.  Eat a healthy diet.  Lose weight if needed.  Avoid alcohol, medicines to help you relax, and some pain medicines.  Do not use any products that contain nicotine or tobacco, such as cigarettes, e-cigarettes, and chewing tobacco. If you need help quitting, ask your doctor. General instructions  Take over-the-counter  and prescription medicines only as told by your doctor.  If you were given a machine to use while you sleep, use it only as told by your doctor.  If you are having surgery, make sure to tell your doctor you have sleep apnea. You may need to bring your device with you.  Keep all follow-up visits as told by your doctor. This is important. Contact a doctor if:  The machine that you were given to use during sleep bothers you or does not seem to be working.  You do not  get better.  You get worse. Get help right away if:  Your chest hurts.  You have trouble breathing in enough air.  You have an uncomfortable feeling in your back, arms, or stomach.  You have trouble talking.  One side of your body feels weak.  A part of your face is hanging down. These symptoms may be an emergency. Do not wait to see if the symptoms will go away. Get medical help right away. Call your local emergency services (911 in the [place] Do not drive yourself to the hospital. Summary  This condition affects breathing during sleep.  The most common cause is a collapsed or blocked airway.  The goal of treatment is to help you breathe normally while you sleep. This information is not intended to replace advice given to you by your health care provider. Make sure you discuss any questions you have with your health care provider. Document Released: 12/25/2007 Document Revised: 01/01/2018 Document Reviewed: 11/10/2017 Elsevier Patient Education  2020 [employer].

## 2019-03-31 ENCOUNTER — Other Ambulatory Visit: Payer: Self-pay | Admitting: *Deleted

## 2019-03-31 DIAGNOSIS — I6523 Occlusion and stenosis of bilateral carotid arteries: Secondary | ICD-10-CM

## 2019-03-31 DIAGNOSIS — I739 Peripheral vascular disease, unspecified: Secondary | ICD-10-CM

## 2019-04-06 ENCOUNTER — Other Ambulatory Visit (INDEPENDENT_AMBULATORY_CARE_PROVIDER_SITE_OTHER): Payer: Self-pay | Admitting: Internal Medicine

## 2019-04-18 ENCOUNTER — Ambulatory Visit (INDEPENDENT_AMBULATORY_CARE_PROVIDER_SITE_OTHER): Payer: Medicare Other | Admitting: Nurse Practitioner

## 2019-04-18 ENCOUNTER — Other Ambulatory Visit: Payer: Self-pay

## 2019-04-18 ENCOUNTER — Encounter (INDEPENDENT_AMBULATORY_CARE_PROVIDER_SITE_OTHER): Payer: Self-pay | Admitting: Nurse Practitioner

## 2019-04-18 ENCOUNTER — Ambulatory Visit (INDEPENDENT_AMBULATORY_CARE_PROVIDER_SITE_OTHER): Payer: Medicare Other | Admitting: Internal Medicine

## 2019-04-18 ENCOUNTER — Telehealth: Payer: Self-pay | Admitting: Cardiovascular Disease

## 2019-04-18 VITALS — BP 154/72 | HR 84 | Temp 97.6°F | Resp 15 | Ht 66.0 in | Wt 165.6 lb

## 2019-04-18 DIAGNOSIS — E785 Hyperlipidemia, unspecified: Secondary | ICD-10-CM

## 2019-04-18 DIAGNOSIS — R5383 Other fatigue: Secondary | ICD-10-CM

## 2019-04-18 DIAGNOSIS — E559 Vitamin D deficiency, unspecified: Secondary | ICD-10-CM | POA: Diagnosis not present

## 2019-04-18 DIAGNOSIS — J449 Chronic obstructive pulmonary disease, unspecified: Secondary | ICD-10-CM

## 2019-04-18 DIAGNOSIS — R9431 Abnormal electrocardiogram [ECG] [EKG]: Secondary | ICD-10-CM

## 2019-04-18 DIAGNOSIS — I10 Essential (primary) hypertension: Secondary | ICD-10-CM

## 2019-04-18 LAB — COMPLETE METABOLIC PANEL WITH GFR
AG Ratio: 1.6 (calc) (ref 1.0–2.5)
ALT: 19 U/L (ref 9–46)
AST: 20 U/L (ref 10–35)
Albumin: 4.3 g/dL (ref 3.6–5.1)
Alkaline phosphatase (APISO): 52 U/L (ref 35–144)
BUN/Creatinine Ratio: 27 (calc) — ABNORMAL HIGH (ref 6–22)
BUN: 32 mg/dL — ABNORMAL HIGH (ref 7–25)
CO2: 26 mmol/L (ref 20–32)
Calcium: 9.7 mg/dL (ref 8.6–10.3)
Chloride: 102 mmol/L (ref 98–110)
Creat: 1.19 mg/dL — ABNORMAL HIGH (ref 0.70–1.18)
GFR, Est African American: 68 mL/min/{1.73_m2} (ref >=60)
GFR, Est Non African American: 59 mL/min/{1.73_m2} — ABNORMAL LOW (ref >=60)
Globulin: 2.7 g/dL (calc) (ref 1.9–3.7)
Glucose, Bld: 86 mg/dL (ref 65–99)
Potassium: 4.8 mmol/L (ref 3.5–5.3)
Sodium: 136 mmol/L (ref 135–146)
Total Bilirubin: 0.6 mg/dL (ref 0.2–1.2)
Total Protein: 7 g/dL (ref 6.1–8.1)

## 2019-04-18 LAB — VITAMIN D 25 HYDROXY (VIT D DEFICIENCY, FRACTURES): Vit D, 25-Hydroxy: 57 ng/mL (ref 30–100)

## 2019-04-18 LAB — T4, FREE: Free T4: 1.2 ng/dL (ref 0.8–1.8)

## 2019-04-18 LAB — TSH: TSH: 0.13 mIU/L — ABNORMAL LOW (ref 0.40–4.50)

## 2019-04-18 LAB — T3, FREE: T3, Free: 5.7 pg/mL — ABNORMAL HIGH (ref 2.3–4.2)

## 2019-04-18 NOTE — Assessment & Plan Note (Signed)
Continue on current medication regimen at this time.

## 2019-04-18 NOTE — Progress Notes (Signed)
Subjective:  Patient ID: William [name]., male    DOB: Nov 25, 1940  Age: 79 y.o. MRN: [id number]  CC:  Chief Complaint  Patient presents with  . Hypertension    follow up    Patient is here to follow-up today for chronic conditions including hypertension, hyperlipidemia, vitamin D deficiency, COPD, and fatigue.  HPI  This patient arrives today for the above.  He has a history of hypertension and currently takes amlodipine, lisinopril, and metoprolol.  He also is followed by cardiology.  He tells me he has not seen them since June.  Per chart review he was due to be followed up again in November, he has not followed up.  He also has a history of hyperlipidemia and continues on his daily aspirin, fish oil, and statin.  He is tolerating these well.  Last lipid panel was collected in June 2020 and showed LDL at 71.  For his history of vitamin D deficiency but he does continue on a vitamin D 3 supplement.  He is due to have his serum level checked today.  He also has a history of COPD and continues on Spiriva.  He denies any changes or worsening respiratory symptoms.  He also has a history of longstanding fatigue.  He continues on thyroid replacement for the off label treatment of this.  Past Medical History:  Diagnosis Date  . Carotid artery occlusion   . Colon polyp   . COPD (chronic obstructive pulmonary disease) ([facility])   . Coronary artery disease   . Hyperlipidemia   . Hypertension   . 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 Myocardial infarction ([place])   . Peripheral arterial disease ([facility])       Family History  Problem Relation Age of Onset  . Heart disease Father        Heart Disease before age 36  . Pulmonary embolism Father   . Deep vein thrombosis Father   . Cancer Mother        Sarcoma    Social History   Social History Narrative   Retired used to work in [employer]. Widower. Lives alone.   Social History   Tobacco Use  . Smoking status: Former Smoker   Packs/day: 2.00    Years: 30.00    Pack years: 60.00    Types: Cigarettes    Start date: 06/03/1961    Quit date: 11/17/1991    Years since quitting: 27.4  . Smokeless tobacco: Former Systems developer    Types: Snuff  Substance Use Topics  . Alcohol use: Not Currently    Alcohol/week: 2.0 - 4.0 standard drinks    Types: 2 - 4 Standard drinks or equivalent per week    Comment: used to      Current Meds  Medication Sig  . amLODipine (NORVASC) 10 MG tablet TAKE 1 TABLET BY MOUTH EVERY DAY  . Ascorbic Acid (VITAMIN C) 1000 MG tablet Take 1,000 mg by mouth daily.  Marland Kitchen aspirin EC 81 MG tablet Take 1 tablet (81 mg total) by mouth daily.  . cholecalciferol (VITAMIN D3) 25 MCG (1000 UT) tablet Take 10,000 Units by mouth daily.  . fish oil-omega-3 fatty acids 1000 MG capsule Take 1,000 g by mouth daily.   . folic acid (FOLVITE) A999333 MCG tablet Take 400 mcg by mouth daily.  . Garlic [id number] MG TABS Take 1,250 mg by mouth daily.   Marland Kitchen ibuprofen (ADVIL,MOTRIN) 200 MG tablet Take 400 mg by mouth every 8 (  eight) hours as needed (for pain/headaches.).  Marland Kitchen lisinopril (ZESTRIL) 40 MG tablet Take 1 tablet (40 mg total) by mouth daily.  . metoprolol tartrate (LOPRESSOR) 25 MG tablet TAKE 1 TABLET BY MOUTH TWICE A DAY  . Misc. Devices MISC by Does not apply route. CPAP  . Multiple Vitamin (MULTIVITAMIN) tablet Take 1 tablet by mouth daily.  . NP THYROID 90 MG tablet Take 1 tablet by mouth once daily  . rosuvastatin (CRESTOR) 10 MG tablet TAKE 1 TABLET BY MOUTH EVERY DAY  . SPIRIVA HANDIHALER 18 MCG inhalation capsule Place 18 mcg into inhaler and inhale daily.   Marland Kitchen thiamine (VITAMIN B-1) 100 MG tablet Take 100 mg by mouth every evening.   . vitamin E 400 UNIT capsule Take 400 Units by mouth daily.    ROS:  Review of Systems  Constitutional: Negative for fever and malaise/fatigue.  Eyes: Negative for blurred vision and double vision.  Respiratory: Negative for cough, sputum production and shortness of breath.     Cardiovascular: Negative for chest pain and palpitations.  Neurological: Negative for dizziness and headaches.     Objective:   Today's Vitals: BP (!) 158/76   Pulse 84   Temp 97.6 F (36.4 C) (Temporal)   Resp 15   Ht 5\' 6"  (1.676 m)   Wt 165 lb 9.6 oz (75.1 kg)   SpO2 94%   BMI 26.73 kg/m  Vitals with BMI 04/18/2019 03/21/2019 03/16/2019  Height 5\' 6"  5\' 6"  -  Weight 165 lbs 10 oz 164 lbs 10 oz 159 lbs  BMI [id number] -  Systolic 0000000 [id number]  Diastolic 76 74 78  Pulse 84 75 64     Physical Exam Vitals reviewed.  Constitutional:      Appearance: Normal appearance.  HENT:     Head: Normocephalic and atraumatic.  Neck:     Vascular: Carotid bruit (bilateral; this is a chronic finding and patient is followed by vascular surgeon for this.) present.  Cardiovascular:     Rate and Rhythm: Normal rate and regular rhythm.  Pulmonary:     Effort: Pulmonary effort is normal.     Breath sounds: Normal breath sounds.  Musculoskeletal:     Cervical back: Neck supple.  Skin:    General: Skin is warm and dry.  Neurological:     Mental Status: He is alert and oriented to person, place, and time.  Psychiatric:        Mood and Affect: Mood normal.        Behavior: Behavior normal.        Thought Content: Thought content normal.        Judgment: Judgment normal.          Assessment   No diagnosis found.    Tests ordered No orders of the defined types were placed in this encounter.    Plan: Please see assessment and plan per problem list below.   No orders of the defined types were placed in this encounter.   Patient to follow-up in 3 months. I spent 21 minutes minutes dedicated to the care of this patient on the date of this encounter which includes a combination of either face-to-face or virtual contact with the patient, ordering of tests and/or procedures, and review of records.   Ailene Ards, NP

## 2019-04-18 NOTE — Assessment & Plan Note (Signed)
This patient is being treated with desiccated thyroid, off label, for symptoms of thyroid deficiency.  The patient has been counseled regarding side effects and how to deal with them.  He will continue on his current dose.  I will collect serum thyroid panel for further evaluation today.

## 2019-04-18 NOTE — Assessment & Plan Note (Addendum)
I again reiterated the importance of him being followed up with cardiology.  I did again inform him that his last EKG that was done in the office back in October was abnormal lasted even more important that he follow-up with cardiology.  He tells me he understands.  He continues to be asymptomatic.

## 2019-04-18 NOTE — Patient Instructions (Signed)
Thank you for choosing [facility] as your medical provider! If you have any questions or concerns regarding your health care, please do not hesitate to call our office.  Continue all medications as prescribed.  Please follow-up with your cardiologist by calling them to schedule yourself an appointment as we discussed in the office today.  Blood pressure is elevated today, thus we did discuss potentially increasing her metoprolol.  You have elected to hold off on this for now, but let me know if you are willing to increase to metoprolol.  Remember your goal blood pressure is less than 140 over less than 90.  Continue monitoring her blood pressure at home with your blood pressure cuff.  If you see that your blood pressure is regularly greater than 140 over greater than 90, please call this office.   Please follow-up as scheduled in 3 months. We look forward to seeing you again soon! Have a great Valentine's Day!!  At [facility] we value your feedback. You may receive a survey about your visit today. Please share your experience as we strive to create trusting relationships with our patients to provide genuine, compassionate, quality care.  We appreciate your understanding and patience as we review any laboratory studies, imaging, and other diagnostic tests that are ordered as we care for you. We do our best to address any and all results in a timely manner. If you do not hear about test results within 1 week, please do not hesitate to contact us. If we referred you to a specialist during your visit or ordered imaging testing, contact the office if you have not been contacted to be scheduled within 1 weeks.  We also encourage the use of MyChart, which contains your medical information for your review as well. If you are not enrolled in this feature, an access code is on this after visit summary for your convenience. Thank you for allowing [place] to be involved in your care.

## 2019-04-18 NOTE — Assessment & Plan Note (Signed)
Patient will continue with Spiriva at this time.  I did discuss with him getting the COVID-19 vaccine, he is interested in getting this administered.  He does live in [place] and plans on getting this administered when it is available to him.

## 2019-04-18 NOTE — Telephone Encounter (Signed)

## 2019-04-18 NOTE — Assessment & Plan Note (Signed)
We will collect serum level for further evaluation today.

## 2019-04-18 NOTE — Assessment & Plan Note (Signed)
Blood pressure is not well controlled in the office today.  We did discuss that this is very important to get under control as he has multiple risk factors for heart attack and stroke in addition to kidney disease.  I did inform him that I would recommend either increasing his metoprolol dose or adding an additional agent for better and tighter control of his blood pressure.  He tells me he is not willing to do this at this time, but will consider this for the future.  I highly encouraged him to follow-up with his cardiologist.  I did discuss that it was mentioned in his last cardiology visit that he was due for follow-up in November which was approximately 2 months ago.  He tells me he will call their office and schedule a follow-up.  I also encouraged him to continue monitoring his blood pressure at home and if he sees that his blood pressure is regularly >140/90 that he should let us know.  He tells me he understands.

## 2019-04-19 ENCOUNTER — Encounter (INDEPENDENT_AMBULATORY_CARE_PROVIDER_SITE_OTHER): Payer: Self-pay | Admitting: Nurse Practitioner

## 2019-04-27 ENCOUNTER — Encounter: Payer: Self-pay | Admitting: Cardiovascular Disease

## 2019-04-27 ENCOUNTER — Telehealth (INDEPENDENT_AMBULATORY_CARE_PROVIDER_SITE_OTHER): Payer: Medicare Other | Admitting: Cardiovascular Disease

## 2019-04-27 VITALS — BP 145/74 | HR 78 | Ht 66.0 in | Wt 160.0 lb

## 2019-04-27 DIAGNOSIS — I6523 Occlusion and stenosis of bilateral carotid arteries: Secondary | ICD-10-CM

## 2019-04-27 DIAGNOSIS — I25708 Atherosclerosis of coronary artery bypass graft(s), unspecified, with other forms of angina pectoris: Secondary | ICD-10-CM | POA: Diagnosis not present

## 2019-04-27 DIAGNOSIS — E785 Hyperlipidemia, unspecified: Secondary | ICD-10-CM

## 2019-04-27 DIAGNOSIS — I351 Nonrheumatic aortic (valve) insufficiency: Secondary | ICD-10-CM

## 2019-04-27 DIAGNOSIS — I739 Peripheral vascular disease, unspecified: Secondary | ICD-10-CM

## 2019-04-27 DIAGNOSIS — I10 Essential (primary) hypertension: Secondary | ICD-10-CM

## 2019-04-27 DIAGNOSIS — G4733 Obstructive sleep apnea (adult) (pediatric): Secondary | ICD-10-CM

## 2019-04-27 DIAGNOSIS — I779 Disorder of arteries and arterioles, unspecified: Secondary | ICD-10-CM

## 2019-04-27 NOTE — Progress Notes (Addendum)
Virtual Visit via Telephone Note   This visit type was conducted due to national recommendations for restrictions regarding the COVID-19 Pandemic (e.g. social distancing) in an effort to limit this patient's exposure and mitigate transmission in our community.  Due to his co-morbid illnesses, this patient is at least at moderate risk for complications without adequate follow up.  This format is felt to be most appropriate for this patient at this time.  The patient did not have access to video technology/had technical difficulties with video requiring transitioning to audio format only (telephone).  All issues noted in this document were discussed and addressed.  No physical exam could be performed with this format.  Please refer to the patient's chart for his  consent to telehealth for [facility].   Date:  04/27/2019   ID:  William [name]., DOB 1940-08-12, MRN [id number]  Patient Location: Home Provider Location: Office  PCP:  Doree Albee, MD  Cardiologist:  Kate Sable, MD  Electrophysiologist:  None   Evaluation Performed:  Follow-Up Visit  Chief Complaint:  CAD  History of Present Illness:    William [name]. is a 79 y.o. male with a history of CAD.   He was hospitalized in February 2017 with a non-STEMI. Coronary angiography demonstrated critical left main obstruction and thus underwent a LIMA to the LAD and saphenous vein graft to the first obtuse marginal branch. Additional past medical history includes hypertension, hyperlipidemia, COPD, tobacco abuse, and peripheral vascular disease with a history of right external iliac artery stentingand bilateral carotid artery stenosis.  Echocardiogram on 05/19/15 demonstrated normal left ventricular systolic function with ischemic wall motion abnormalities, EF 50-55%, mild LVH, grade 1 diastolic dysfunction, and mild to moderate aortic regurgitation.  He denies any significant changes in his symptoms since I last  spoke to him in May 2020.  He has no significant exertional angina.  Chronic exertional dyspnea from COPD and tobacco use is stable.  He checks his blood pressure upon first awakening in systolic readings are in the 150 range.  He will check it 1 minute later and blood pressure normalizes.   Past Medical History:  Diagnosis Date  . Carotid artery occlusion   . Colon polyp   . COPD (chronic obstructive pulmonary disease) ([place])   . Coronary artery disease   . Hyperlipidemia   . Hypertension   . 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 Myocardial infarction ([place])   . Peripheral arterial disease [facility])    Past Surgical History:  Procedure Laterality Date  . ABDOMINAL AORTOGRAM W/LOWER EXTREMITY N/A 08/21/2017   Procedure: ABDOMINAL AORTOGRAM W/LOWER EXTREMITY;  Surgeon: Angelia Mould, MD;  Location: [facility];  Service: Cardiovascular;  Laterality: N/A;  . Bilateral renal  artery stenoses  04/23/2009  . CARDIAC CATHETERIZATION N/A 05/21/2015   Procedure: Left Heart Cath and Coronary Angiography;  Surgeon: Lorretta Harp, MD;  Location: [facility];  Service: Cardiovascular;  Laterality: N/A;  . CORONARY ARTERY BYPASS GRAFT N/A 05/22/2015   Procedure: CORONARY ARTERY BYPASS GRAFTING (CABG) LIMA-LAD and SVG-OM EVH RIGHT THIGH GREATER SAPHENOUS VEIN;  Surgeon: [name], MD;  Location: [facility];  Service: Open Heart Surgery;  Laterality: N/A;  . HIATAL HERNIA REPAIR    . Percutaneous translumnial angioplasty  04/23/2009   Catheterization of Lefst external iliac artery with Left lower extremity runoff  . TEE WITHOUT CARDIOVERSION N/A 05/22/2015   Procedure: TRANSESOPHAGEAL ECHOCARDIOGRAM (TEE);  Surgeon: [name],  MD;  Location: [facility];  Service: Open Heart Surgery;  Laterality: N/A;     Current Meds  Medication Sig  . amLODipine (NORVASC) 10 MG tablet TAKE 1 TABLET BY MOUTH EVERY DAY  . Ascorbic Acid (VITAMIN C) 1000 MG tablet Take 1,000 mg by mouth daily.  Marland Kitchen  aspirin EC 81 MG tablet Take 1 tablet (81 mg total) by mouth daily.  . cholecalciferol (VITAMIN D3) 25 MCG (1000 UT) tablet Take 10,000 Units by mouth daily.  . fish oil-omega-3 fatty acids 1000 MG capsule Take 1,000 g by mouth daily.   . folic acid (FOLVITE) A999333 MCG tablet Take 400 mcg by mouth daily.  . Garlic [id number] MG TABS Take 1,250 mg by mouth daily.   Marland Kitchen ibuprofen (ADVIL,MOTRIN) 200 MG tablet Take 400 mg by mouth every 8 (eight) hours as needed (for pain/headaches.).  Marland Kitchen lisinopril (ZESTRIL) 40 MG tablet Take 1 tablet (40 mg total) by mouth daily.  . metoprolol tartrate (LOPRESSOR) 25 MG tablet TAKE 1 TABLET BY MOUTH TWICE A DAY  . Misc. Devices MISC by Does not apply route. CPAP  . Multiple Vitamin (MULTIVITAMIN) tablet Take 1 tablet by mouth daily.  . NP THYROID 90 MG tablet Take 1 tablet by mouth once daily  . rosuvastatin (CRESTOR) 10 MG tablet TAKE 1 TABLET BY MOUTH EVERY DAY  . SPIRIVA HANDIHALER 18 MCG inhalation capsule Place 18 mcg into inhaler and inhale daily.   Marland Kitchen thiamine (VITAMIN B-1) 100 MG tablet Take 100 mg by mouth every evening.   . vitamin E 400 UNIT capsule Take 400 Units by mouth daily.     Allergies:   Patient has no known allergies.   Social History   Tobacco Use  . Smoking status: Former Smoker    Packs/day: 2.00    Years: 30.00    Pack years: 60.00    Types: Cigarettes    Start date: 06/03/1961    Quit date: 11/17/1991    Years since quitting: 27.4  . Smokeless tobacco: Former Systems developer    Types: Snuff  Substance Use Topics  . Alcohol use: Not Currently    Alcohol/week: 2.0 - 4.0 standard drinks    Types: 2 - 4 Standard drinks or equivalent per week    Comment: used to   . Drug use: No     Family Hx: The patient's family history includes Cancer in his mother; Deep vein thrombosis in his father; Heart disease in his father; Pulmonary embolism in his father.  ROS:   Please see the history of present illness.     All other systems reviewed and are  negative.   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  There also appear to be limb lead reversal.  Recent Labs: 04/18/2019: ALT 19; BUN 32; Creat 1.19; Potassium 4.8; Sodium 136; TSH 0.13   Recent Lipid Panel Lab Results  Component Value Date/Time   CHOL 137 05/19/2015 02:24 AM   TRIG 74 05/19/2015 02:24 AM   HDL 33 (L) 05/19/2015 02:24 AM   CHOLHDL 4.2 05/19/2015 02:24 AM   LDLCALC 89 05/19/2015 02:24 AM    Wt Readings from Last 3 Encounters:  04/27/19 160 lb (72.6 kg)  04/18/19 165 lb 9.6 oz (75.1 kg)  03/21/19 164 lb 9.6 oz (74.7 kg)  Objective:    Vital Signs:  BP (!) 145/74   Pulse 78   Ht 5\' 6"  (1.676 m)   Wt 160 lb (72.6 kg)   BMI 25.82 kg/m    VITAL SIGNS:  reviewed  ASSESSMENT & PLAN:    1. CAD s/p 2-vessel CABG with NSTEMI: Symptomatically stable.Continue ASA,metoprolol, ACEI, and Crestor.He was not able to tolerate higher dose of statin in the past.  2. Essential [id number] pressure iselevated. He checks his blood pressure upon first awakening in systolic readings are in the 150 range.  He will check it 1 minute later and blood pressure normalizes. This will need further monitoring.  3. Hyperlipidemia: Continue Crestor.He was not able to tolerate higher dose of statin in the past and has had side effects from Lipitor and Zocor.LDL 71 on 09/09/2018.  4. PVD/Bilateralcarotid artery stenosis: Follows with vascular surgery.Continue aspirin and statin.Carotid Doppler on 03/15/2019 showed 1 to 39% bilateral internal carotid artery stenosis.  5. OSA: On CPAP.   6.  Aortic regurgitation: Mild to moderate in severity by echocardiogram in February 2017.  I will obtain a follow-up echocardiogram.    COVID-19 Education: The signs and  symptoms of COVID-19 were discussed with the patient and how to seek care for testing (follow up with PCP or arrange E-visit).  The importance of social distancing was discussed today.  Time:   Today, I have spent 15 minutes with the patient with telehealth technology discussing the above problems.     Medication Adjustments/Labs and Tests Ordered: Current medicines are reviewed at length with the patient today.  Concerns regarding medicines are outlined above.   Tests Ordered: No orders of the defined types were placed in this encounter.   Medication Changes: No orders of the defined types were placed in this encounter.   Follow Up:  In office in 1 year  Signed, Kate Sable, MD  04/27/2019 8:37 AM    [facility]

## 2019-04-27 NOTE — Addendum Note (Signed)
Addended by: Merlene Laughter on: 04/27/2019 08:54 AM   Modules accepted: Orders

## 2019-04-27 NOTE — Patient Instructions (Addendum)
Medication Instructions:   Your physician recommends that you continue on your current medications as directed. Please refer to the Current Medication list given to you today.  Labwork:  NONE  Testing/Procedures: Your physician has requested that you have an echocardiogram. Echocardiography is a painless test that uses sound waves to create images of your heart. It provides your doctor with information about the size and shape of your heart and how well your heart's chambers and valves are working. This procedure takes approximately one hour. There are no restrictions for this procedure.  Follow-Up:  Your physician recommends that you schedule a follow-up appointment in: 1 year (office). You will receive a reminder letter in the mail in about 10 months reminding you to call and schedule your appointment. If you don't receive this letter, please contact our office.  Any Other Special Instructions Will Be Listed Below (If Applicable).  If you need a refill on your cardiac medications before your next appointment, please call your pharmacy. 

## 2019-05-05 DIAGNOSIS — H16223 Keratoconjunctivitis sicca, not specified as Sjogren's, bilateral: Secondary | ICD-10-CM | POA: Diagnosis not present

## 2019-05-05 DIAGNOSIS — H11041 Peripheral pterygium, stationary, right eye: Secondary | ICD-10-CM | POA: Diagnosis not present

## 2019-05-18 ENCOUNTER — Other Ambulatory Visit: Payer: Self-pay | Admitting: Cardiovascular Disease

## 2019-05-19 ENCOUNTER — Other Ambulatory Visit (INDEPENDENT_AMBULATORY_CARE_PROVIDER_SITE_OTHER): Payer: Self-pay | Admitting: Internal Medicine

## 2019-05-19 ENCOUNTER — Other Ambulatory Visit: Payer: Medicare Other

## 2019-05-27 ENCOUNTER — Other Ambulatory Visit: Payer: Self-pay | Admitting: Cardiovascular Disease

## 2019-06-15 ENCOUNTER — Other Ambulatory Visit: Payer: Medicare Other

## 2019-06-22 ENCOUNTER — Other Ambulatory Visit: Payer: Self-pay

## 2019-06-22 ENCOUNTER — Ambulatory Visit (INDEPENDENT_AMBULATORY_CARE_PROVIDER_SITE_OTHER): Payer: Medicare Other

## 2019-06-22 DIAGNOSIS — I351 Nonrheumatic aortic (valve) insufficiency: Secondary | ICD-10-CM | POA: Diagnosis not present

## 2019-06-27 ENCOUNTER — Telehealth: Payer: Self-pay | Admitting: *Deleted

## 2019-06-27 NOTE — Telephone Encounter (Signed)
William [name], [place]  [id number] PM EDT    Patient notified. Copy to pcp.

## 2019-06-27 NOTE — Telephone Encounter (Signed)
-----   Message from Herminio Commons, MD sent at 06/23/2019 12:13 PM EDT ----- Vigorous cardiac function

## 2019-07-06 ENCOUNTER — Other Ambulatory Visit (INDEPENDENT_AMBULATORY_CARE_PROVIDER_SITE_OTHER): Payer: Self-pay | Admitting: Internal Medicine

## 2019-07-06 ENCOUNTER — Other Ambulatory Visit: Payer: Self-pay | Admitting: Cardiovascular Disease

## 2019-07-18 ENCOUNTER — Ambulatory Visit (INDEPENDENT_AMBULATORY_CARE_PROVIDER_SITE_OTHER): Payer: Medicare Other | Admitting: Internal Medicine

## 2019-07-18 ENCOUNTER — Other Ambulatory Visit: Payer: Self-pay

## 2019-07-18 ENCOUNTER — Encounter (INDEPENDENT_AMBULATORY_CARE_PROVIDER_SITE_OTHER): Payer: Self-pay | Admitting: Internal Medicine

## 2019-07-18 VITALS — BP 120/70 | HR 73 | Temp 97.4°F | Ht 66.0 in | Wt 163.0 lb

## 2019-07-18 DIAGNOSIS — E785 Hyperlipidemia, unspecified: Secondary | ICD-10-CM | POA: Diagnosis not present

## 2019-07-18 DIAGNOSIS — E559 Vitamin D deficiency, unspecified: Secondary | ICD-10-CM | POA: Diagnosis not present

## 2019-07-18 DIAGNOSIS — I10 Essential (primary) hypertension: Secondary | ICD-10-CM

## 2019-07-18 DIAGNOSIS — J449 Chronic obstructive pulmonary disease, unspecified: Secondary | ICD-10-CM | POA: Diagnosis not present

## 2019-07-18 DIAGNOSIS — I779 Disorder of arteries and arterioles, unspecified: Secondary | ICD-10-CM | POA: Diagnosis not present

## 2019-07-18 LAB — COMPLETE METABOLIC PANEL WITH GFR
AG Ratio: 1.7 (calc) (ref 1.0–2.5)
ALT: 21 U/L (ref 9–46)
AST: 19 U/L (ref 10–35)
Albumin: 4.4 g/dL (ref 3.6–5.1)
Alkaline phosphatase (APISO): 58 U/L (ref 35–144)
BUN/Creatinine Ratio: 26 (calc) — ABNORMAL HIGH (ref 6–22)
BUN: 30 mg/dL — ABNORMAL HIGH (ref 7–25)
CO2: 28 mmol/L (ref 20–32)
Calcium: 9.8 mg/dL (ref 8.6–10.3)
Chloride: 105 mmol/L (ref 98–110)
Creat: 1.16 mg/dL (ref 0.70–1.18)
GFR, Est African American: 70 mL/min/{1.73_m2} (ref >=60)
GFR, Est Non African American: 60 mL/min/{1.73_m2} (ref >=60)
Globulin: 2.6 g/dL (calc) (ref 1.9–3.7)
Glucose, Bld: 90 mg/dL (ref 65–99)
Potassium: 5.1 mmol/L (ref 3.5–5.3)
Sodium: 140 mmol/L (ref 135–146)
Total Bilirubin: 0.4 mg/dL (ref 0.2–1.2)
Total Protein: 7 g/dL (ref 6.1–8.1)

## 2019-07-18 LAB — LIPID PANEL
Cholesterol: 127 mg/dL (ref <200)
HDL: 41 mg/dL (ref >=40)
LDL Cholesterol (Calc): 71 mg/dL (calc)
Non-HDL Cholesterol (Calc): 86 mg/dL (calc) (ref <130)
Total CHOL/HDL Ratio: 3.1 (calc) (ref <5.0)
Triglycerides: 74 mg/dL (ref <150)

## 2019-07-18 NOTE — Patient Instructions (Signed)
[facility] Dietary Recommendations for Weight Loss What to Avoid . Avoid added sugars o Often added sugar can be found in processed foods such as many condiments, dry cereals, cakes, cookies, chips, crisps, crackers, candies, sweetened drinks, etc.  o Read labels and AVOID/DECREASE use of foods with the following in their ingredient list: Sugar, fructose, high fructose corn syrup, sucrose, glucose, maltose, dextrose, molasses, cane sugar, brown sugar, any type of syrup, agave nectar, etc.   . Avoid snacking in between meals . Avoid foods made with flour o If you are going to eat food made with flour, choose those made with whole-grains; and, minimize your consumption as much as is tolerable . Avoid processed foods o These foods are generally stocked in the middle of the grocery store. Focus on shopping on the perimeter of the grocery.  . Avoid Meat  o We recommend following a plant-based diet at [facility]. Thus, we recommend avoiding meat as a general rule. Consider eating beans, legumes, eggs, and/or dairy products for regular protein sources o If you plan on eating meat limit to 4 ounces of meat at a time and choose lean options such as Fish, chicken, turkey. Avoid red meat intake such as pork and/or steak What to Include . Vegetables o GREEN LEAFY VEGETABLES: Kale, spinach, mustard greens, collard greens, cabbage, broccoli, etc. o OTHER: Asparagus, cauliflower, eggplant, carrots, peas, Brussel sprouts, tomatoes, bell peppers, zucchini, beets, cucumbers, etc. . Grains, seeds, and legumes o Beans: kidney beans, black eyed peas, garbanzo beans, black beans, pinto beans, etc. o Whole, unrefined grains: brown rice, barley, bulgur, oatmeal, etc. . Healthy fats  o Avoid highly processed fats such as vegetable oil o Examples of healthy fats: avocado, olives, virgin olive oil, dark chocolate (?72% Cocoa), nuts (peanuts, almonds, walnuts, cashews, pecans, etc.) . None to Low  Intake of Animal Sources of Protein o Meat sources: chicken, turkey, salmon, tuna. Limit to 4 ounces of meat at one time. o Consider limiting dairy sources, but when choosing dairy focus on: PLAIN Greek yogurt, cottage cheese, high-protein milk . Fruit o Choose berries  When to Eat . Intermittent Fasting: o Choosing not to eat for a specific time period, but DO FOCUS ON HYDRATION when fasting o Multiple Techniques: - Time Restricted Eating: eat 3 meals in a day, each meal lasting no more than 60 minutes, no snacks between meals - 16-18 hour fast: fast for 16 to 18 hours up to 7 days a week. Often suggested to start with 2-3 nonconsecutive days per week.  . Remember the time you sleep is counted as fasting.  . Examples of eating schedule: Fast from 7:00pm-11:00am. Eat between 11:00am-7:00pm.  - 24-hour fast: fast for 24 hours up to every other day. Often suggested to start with 1 day per week . Remember the time you sleep is counted as fasting . Examples of eating schedule:  o Eating day: eat 2-3 meals on your eating day. If doing 2 meals, each meal should last no more than 90 minutes. If doing 3 meals, each meal should last no more than 60 minutes. Finish last meal by 7:00pm. o Fasting day: Fast until 7:00pm.  o IF YOU FEEL UNWELL FOR ANY REASON/IN ANY WAY WHEN FASTING, STOP FASTING BY EATING A NUTRITIOUS SNACK OR LIGHT MEAL o ALWAYS FOCUS ON HYDRATION DURING FASTS - Acceptable Hydration sources: water, broths, tea/coffee (black tea/coffee is best but using a small amount of whole-fat dairy products in coffee/tea is acceptable).  -   Poor Hydration Sources: anything with sugar or artificial sweeteners added to it  These recommendations have been developed for patients that are actively receiving medical care from either Dr. Lesia Monica or Sarah Gray, DNP, NP-C at [facility]. These recommendations are developed for patients with specific medical conditions and are not meant to be  distributed or used by others that are not actively receiving care from either provider listed above at [facility]. It is not appropriate to participate in the above eating plans without proper medical supervision.   Reference: Fung, J. The obesity code. [place]: [place]; 2016.   

## 2019-07-18 NOTE — Progress Notes (Signed)
Metrics: Intervention Frequency ACO  Documented Smoking Status Yearly  Screened one or more times in 24 months  Cessation Counseling or  Active cessation medication Past 24 months  Past 24 months   Guideline developer: UpToDate (See UpToDate for funding source) Date Released: 2014       Wellness Office Visit  Subjective:  Patient ID: William [name]., male    DOB: 1940-06-12  Age: 79 y.o. MRN: [id number]  CC: This man comes in for follow-up of hypertension, COPD, hyperlipidemia in the face of coronary artery disease and peripheral vascular disease. HPI He is an ex-smoker and he is doing as best as he can to prevent further issues.  He tries to keep active. He continues with antihypertensive therapy and blood pressure readings were concerning last time but he has worked on this and today's blood pressure is very good. He also had an echocardiogram with cardiology which was within normal limits from what I can see. He continues with statin therapy and is tolerating this without myalgias. He continues with desiccated NP thyroid, off label, for symptoms of thyroid deficiency and his last T3 level was optimal.  He is also tolerating this well.  He has no side effects.  Past Medical History:  Diagnosis Date  . Carotid artery occlusion   . Colon polyp   . COPD (chronic obstructive pulmonary disease) ([place])   . Coronary artery disease   . Hyperlipidemia   . Hypertension   . 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 Myocardial infarction ([place])   . Peripheral arterial disease ([facility])       Family History  Problem Relation Age of Onset  . Heart disease Father        Heart Disease before age 53  . Pulmonary embolism Father   . Deep vein thrombosis Father   . Cancer Mother        Sarcoma    Social History   Social History Narrative   Retired used to work in [employer]. Widower. Lives alone.   Social History   Tobacco Use  . Smoking status: Former Smoker    Packs/day: 2.00   Years: 30.00    Pack years: 60.00    Types: Cigarettes    Start date: 06/03/1961    Quit date: 11/17/1991    Years since quitting: 27.6  . Smokeless tobacco: Former Systems developer    Types: Snuff  Substance Use Topics  . Alcohol use: Not Currently    Alcohol/week: 2.0 - 4.0 standard drinks    Types: 2 - 4 Standard drinks or equivalent per week    Comment: used to     Current Meds  Medication Sig  . amLODipine (NORVASC) 10 MG tablet TAKE 1 TABLET BY MOUTH EVERY DAY  . Ascorbic Acid (VITAMIN C) 1000 MG tablet Take 1,000 mg by mouth daily.  Marland Kitchen aspirin EC 81 MG tablet Take 1 tablet (81 mg total) by mouth daily.  . cholecalciferol (VITAMIN D3) 25 MCG (1000 UT) tablet Take 10,000 Units by mouth daily.  . fish oil-omega-3 fatty acids 1000 MG capsule Take 1,000 g by mouth daily.   . folic acid (FOLVITE) A999333 MCG tablet Take 400 mcg by mouth daily.  . Garlic [id number] MG TABS Take 1,250 mg by mouth daily.   Marland Kitchen lisinopril (ZESTRIL) 40 MG tablet TAKE 1 TABLET BY MOUTH EVERY DAY  . metoprolol tartrate (LOPRESSOR) 25 MG tablet TAKE 1 TABLET BY MOUTH TWICE A DAY  . Misc. Devices MISC by Does  not apply route. CPAP  . Multiple Vitamin (MULTIVITAMIN) tablet Take 1 tablet by mouth daily.  . NP THYROID 90 MG tablet Take 1 tablet by mouth once daily  . rosuvastatin (CRESTOR) 10 MG tablet TAKE 1 TABLET BY MOUTH EVERY DAY  . SPIRIVA HANDIHALER 18 MCG inhalation capsule Place 18 mcg into inhaler and inhale daily.   Marland Kitchen thiamine (VITAMIN B-1) 100 MG tablet Take 100 mg by mouth every evening.   . vitamin E 400 UNIT capsule Take 400 Units by mouth daily.  . [DISCONTINUED] ibuprofen (ADVIL,MOTRIN) 200 MG tablet Take 400 mg by mouth every 8 (eight) hours as needed (for pain/headaches.).       Objective:   Today's Vitals: BP 120/70 (BP Location: Right Arm, Patient Position: Sitting, Cuff Size: Normal)   Pulse 73   Temp (!) 97.4 F (36.3 C) (Temporal)   Ht 5\' 6"  (1.676 m)   Wt 163 lb (73.9 kg)   SpO2 93%   BMI 26.31  kg/m  Vitals with BMI 07/18/2019 04/27/2019 04/18/2019  Height 5\' 6"  5\' 6"  -  Weight 163 lbs 160 lbs -  BMI [id number] -  Systolic 123456 [id number]  Diastolic 70 74 72  Pulse 73 78 -     Physical Exam   He looks systemically well.  Blood pressure is excellent today.  Weight is stable.  No new physical findings.  He is alert and orientated without any obvious focal neurological signs.    Assessment   1. Essential hypertension   2. Vitamin D deficiency   3. Hyperlipidemia, unspecified hyperlipidemia type   4. Chronic obstructive pulmonary disease, unspecified COPD type ([place])       Tests ordered Orders Placed This Encounter  Procedures  . COMPLETE METABOLIC PANEL WITH GFR  . Lipid panel     Plan: 1. Blood work is ordered above. 2. He will continue with antihypertensive therapy which seems to be controlling her blood pressure very well. 3. He will continue with vitamin D 3 supplementation which showed good levels on the last visit. 4. He will continue with statin therapy and we will check lipid panel today. 5. Further recommendations will depend on results and he already has a appointment scheduled in October for an annual Medicare wellness visit.   No orders of the defined types were placed in this encounter.   Doree Albee, MD

## 2019-07-19 ENCOUNTER — Encounter (INDEPENDENT_AMBULATORY_CARE_PROVIDER_SITE_OTHER): Payer: Self-pay | Admitting: Internal Medicine

## 2019-07-26 ENCOUNTER — Other Ambulatory Visit (INDEPENDENT_AMBULATORY_CARE_PROVIDER_SITE_OTHER): Payer: Self-pay | Admitting: Internal Medicine

## 2019-09-19 ENCOUNTER — Ambulatory Visit: Payer: Medicare Other | Admitting: Family Medicine

## 2019-09-24 ENCOUNTER — Other Ambulatory Visit (INDEPENDENT_AMBULATORY_CARE_PROVIDER_SITE_OTHER): Payer: Self-pay | Admitting: Nurse Practitioner

## 2019-10-19 ENCOUNTER — Other Ambulatory Visit (INDEPENDENT_AMBULATORY_CARE_PROVIDER_SITE_OTHER): Payer: Self-pay | Admitting: Internal Medicine

## 2019-12-08 ENCOUNTER — Ambulatory Visit (INDEPENDENT_AMBULATORY_CARE_PROVIDER_SITE_OTHER): Payer: Medicare Other | Admitting: Family Medicine

## 2019-12-08 ENCOUNTER — Encounter: Payer: Self-pay | Admitting: Family Medicine

## 2019-12-08 VITALS — BP 124/68 | HR 71 | Ht 66.0 in | Wt 164.6 lb

## 2019-12-08 DIAGNOSIS — G4733 Obstructive sleep apnea (adult) (pediatric): Secondary | ICD-10-CM

## 2019-12-08 DIAGNOSIS — Z9989 Dependence on other enabling machines and devices: Secondary | ICD-10-CM

## 2019-12-08 NOTE — Progress Notes (Addendum)
PATIENT: William [name]. DOB: [birth year]  REASON FOR VISIT: follow up HISTORY FROM: patient  Chief Complaint  Patient presents with  . Follow-up    6 month f/u for OSA. States he is still getting the angry face on his machine due to a leak.   . room 7    alone     HISTORY OF PRESENT ILLNESS: Today 12/08/19 William [name]. is a 79 y.o. male here today for follow up for OSA on CPAP.  He continues to do well with CPAP therapy.  He does note significant improvement in daytime energy.  He continues to note a leak on his mask.  He feels that nasal pillows are the best fit.  Facial hair is most likely contributing to an adequate mask seal.  Otherwise he is doing very well.  Compliance report dated 11/07/2019 through 12/06/2019 reveals that he used CPAP 29 of the past 30 days for compliance of 97%.  He used CPAP greater than 4 hours 28 of the past 30 days for compliance of 93%.  Average usage on days used was 8 hours and 25 minutes.  Residual AHI was 2.6 on a set pressure of 10 cm of water.  Leak continues to be elevated in the 95th percentile of 53.2 L/min.  HISTORY: (copied from my note on 03/21/2019)  William [name]. is a 79 y.o. male here today for follow up for OSA on CPAP. He reports that he is doing very well with CPAP therapy. He is using CPAP nightly. He does have a full mustache and beard. He feels that this is why he continues to note a leak. He feels that he has his mask and headgear as tight as he can tolerate.  He does note that he wakes up most mornings around 3:57 AM.  He states that he can sit on the side of the bed for a few minutes and drifts right back off to sleep.  He denies any concerns of nocturia.  He has no trouble with insomnia.  He feels well rested when he wakes.  Compliance report dated 02/15/2019 through 03/16/2019 reveals that he has used CPAP 30 out of the last 30 days for compliance of 100%.  29 of the last 30 days he used CPAP greater than 4 hours for  compliance of 97%.  Average usage was 7 hours and 23 minutes.  Residual AHI was 1.6 on a set pressure of 10 cm of water and an EPR of 1 leak noted in the 95th percentile of 43.1.   HISTORY: (copied from William [name] note on 07/13/2018)  William [name] is a 79 year old right-handed gentleman with an underlyingcomplexmedical history of peripheral vascular disease with status post iliac stenting, coronary artery disease with status post CABG in 2017, hypertension, vitamin D deficiency, COPD, history of non-STEMI, hyperlipidemia, carotid artery disease, and overweight state, with whom I am conducting a virtual, phone based follow-up appointment today in lieu of a face-to-face visit for follow-up consultation of his obstructive sleep apnea after interim sleep study testing and starting CPAP therapy. The patient is unaccompanied today and joins fromhome.I first met him on 02/23/2018 at the request of his primary care physician, William [name], at which time he reported snoring and excessive daytime somnolence. He was advised to proceed with sleep study testing. I went over his test results with him today. He had a baseline sleep study on 04/05/2018 which showed a sleep latency delayed, REM latency 56 minutes, sleep  efficiency reduced at 50.6%, he had absence of slow-wave sleep and REM sleep was 17.8%, total AHI 24.8 per hour, supine AHI 60.4 per hour and REM AHI 52.5 per hour. Average oxygen saturation 92%, nadir of 81%. He had severe PLMS with mild arousals. He was invited for a CPAP titration study. This was on 04/21/2018. Sleep efficiency 57.6%, sleep latency delayed, REM latency 140 minutes. He had near absence of slow-wave sleep and REM sleep was normal at 22.7%. CPAP was titrated from 5 cm to 9 cm. On the final pressure his AHI was 0.5 per hour, O2 nadir of 89% with supine REM sleep achieved. Based on his test results I prescribed CPAP therapy for home use at a pressure of 10 cm.  He also had an interim brain  MRI in December 2019 because of his report of intermittent diplopia. He had the study in [place], [place], brain MRI with and without contrast showed periventricular white matter changes, no abnormal enhancement.  Today, 07/13/2018: Please also see below for virtual visit documentation.  I reviewed his CPAP compliance data from 06/12/2018 through 07/11/2018 which is a total of 30 days, during which time he used his machine every night with percent used days greater than 4 hours at 90%, indicating excellent compliance with an average usage of 5 hours and 39 minutes, residual AHI at goal at 2.3 per hour, leak high with the 95thpercentile at 42.2 L/m on a pressure of 10 cm.  Previously:  02/23/2018: (He) reports snoring and excessive daytime somnolence. I reviewed your office note from 01/05/2018, when he saw William [name], nurse practitioner. His Epworth sleepiness score is 8 out of 24 today, fatigue score is 32 out of 63. He is widowed and lives alone, he has 2 children. 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 He drinks caffeine in the form of coffee, 2 per day on average. He had a BMP checked at your office on 01/19/2018 and I reviewed the results: BUN was 26, creatinine 0.94. He is trying to hydrate better. He reports a history of double vision for the past 5 years, symptoms have been infrequent, very brief, less than a minute at a time but have increased in frequency, maybe averaging once a month whereas he may have had double vision once every few months in the past. He has seen ophthalmology for this. He denies any one-sided weakness or numbness or stroke like symptoms in the past. He is followed by cardiology. He was encouraged to consider a sleep study by his cardiologist as well in the past. He has never had a sleep study. He does snore loudly.  He is not aware of any family history of OSA. His bedtime is around 8:30 and rise time around 3:30. He has nocturia about twice per average night and denies morning headaches.    REVIEW OF SYSTEMS: Out of a complete 14 system review of symptoms, the patient complains only of the following symptoms, none and all other reviewed systems are negative.   ALLERGIES: No Known Allergies  HOME MEDICATIONS: Outpatient Medications Prior to Visit  Medication Sig Dispense Refill  . amLODipine (NORVASC) 10 MG tablet TAKE 1 TABLET BY MOUTH EVERY DAY 90 tablet 0  . Ascorbic Acid (VITAMIN C) 1000 MG tablet Take 1,000 mg by mouth daily.    Marland Kitchen  aspirin EC 81 MG tablet Take 1 tablet (81 mg total) by mouth daily.    . cholecalciferol (VITAMIN D3) 25 MCG (1000 UT) tablet Take 10,000 Units by mouth daily.    . fish oil-omega-3 fatty acids 1000 MG capsule Take 1,000 g by mouth daily.     . folic acid (FOLVITE) 914 MCG tablet Take 400 mcg by mouth daily.    . Garlic 7829 MG TABS Take 1,250 mg by mouth daily.     Marland Kitchen lisinopril (ZESTRIL) 40 MG tablet TAKE 1 TABLET BY MOUTH EVERY DAY 90 tablet 2  . metoprolol tartrate (LOPRESSOR) 25 MG tablet TAKE 1 TABLET BY MOUTH TWICE A DAY 180 tablet 1  . Misc. Devices MISC by Does not apply route. CPAP    . Multiple Vitamin (MULTIVITAMIN) tablet Take 1 tablet by mouth daily.    . rosuvastatin (CRESTOR) 10 MG tablet TAKE 1 TABLET BY MOUTH EVERY DAY 90 tablet 2  . SPIRIVA HANDIHALER 18 MCG inhalation capsule Place 18 mcg into inhaler and inhale daily.   8  . thiamine (VITAMIN B-1) 100 MG tablet Take 100 mg by mouth every evening.     . vitamin E 400 UNIT capsule Take 400 Units by mouth daily.    . NP THYROID 90 MG tablet Take 1 tablet by mouth once daily 30 tablet 3   No facility-administered medications prior to visit.    PAST MEDICAL HISTORY: Past Medical History:  Diagnosis Date  . Carotid artery occlusion   . Colon polyp   . COPD (chronic obstructive pulmonary  disease) ([place])   . Coronary artery disease   . Hyperlipidemia   . Hypertension   . 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 Myocardial infarction ([place])   . Peripheral arterial disease ([place])     PAST SURGICAL HISTORY: Past Surgical History:  Procedure Laterality Date  . ABDOMINAL AORTOGRAM W/LOWER EXTREMITY N/A 08/21/2017   Procedure: ABDOMINAL AORTOGRAM W/LOWER EXTREMITY;  Surgeon: Angelia Mould, MD;  Location: [facility];  Service: Cardiovascular;  Laterality: N/A;  . Bilateral renal  artery stenoses  04/23/2009  . CARDIAC CATHETERIZATION N/A 05/21/2015   Procedure: Left Heart Cath and Coronary Angiography;  Surgeon: Lorretta Harp, MD;  Location: [facility];  Service: Cardiovascular;  Laterality: N/A;  . CORONARY ARTERY BYPASS GRAFT N/A 05/22/2015   Procedure: CORONARY ARTERY BYPASS GRAFTING (CABG) LIMA-LAD and SVG-OM EVH RIGHT THIGH GREATER SAPHENOUS VEIN;  Surgeon: [name], MD;  Location: [facility];  Service: Open Heart Surgery;  Laterality: N/A;  . HIATAL HERNIA REPAIR    . Percutaneous translumnial angioplasty  04/23/2009   Catheterization of [place]efst external iliac artery with Left lower extremity runoff  . TEE WITHOUT CARDIOVERSION N/A 05/22/2015   Procedure: TRANSESOPHAGEAL ECHOCARDIOGRAM (TEE);  Surgeon: [name], MD;  Location: [facility];  Service: Open Heart Surgery;  Laterality: N/A;    FAMILY HISTORY: Family History  Problem Relation Age of Onset  . Heart disease Father        Heart Disease before age 42  . Pulmonary embolism Father   . Deep vein thrombosis Father   . Cancer Mother        Sarcoma    SOCIAL HISTORY: Social History   Socioeconomic History  . Marital status: Married    Spouse name: Not on file  . Number of children: Not on file  . Years of education: Not on file  . Highest education level: Not on file  Occupational History  . Not on file  Tobacco Use  . Smoking status: Former Smoker    Packs/day: 2.00    Years: 30.00      Pack years: 60.00    Types: Cigarettes    Start date: 06/03/1961    Quit date: 11/17/1991    Years since quitting: 28.0  . Smokeless tobacco: Former Systems developer    Types: Snuff  Vaping Use  . Vaping Use: Never used  Substance and Sexual Activity  . Alcohol use: Not Currently    Alcohol/week: 2.0 - 4.0 standard drinks    Types: 2 - 4 Standard drinks or equivalent per week    Comment: used to   . Drug use: No  . Sexual activity: Not Currently  Other Topics Concern  . Not on file  Social History Narrative   Retired used to work in [employer]. Widower. Lives alone.   Social Determinants of Health   Financial Resource Strain:   . Difficulty of Paying Living Expenses: Not on file  Food Insecurity:   . Worried About Charity fundraiser in the Last Year: Not on file  . Ran Out of Food in the Last Year: Not on file  Transportation Needs:   . Lack of Transportation (Medical): Not on file  . Lack of Transportation (Non-Medical): Not on file  Physical Activity:   . Days of Exercise per Week: Not on file  . Minutes of Exercise per Session: Not on file  Stress:   . Feeling of Stress : Not on file  Social Connections:   . Frequency of Communication with Friends and Family: Not on file  . Frequency of Social Gatherings with Friends and Family: Not on file  . Attends Religious Services: Not on file  . Active Member of Clubs or Organizations: Not on file  . Attends Archivist Meetings: Not on file  . Marital Status: Not on file  Intimate Partner Violence:   . Fear of Current or Ex-Partner: Not on file  . Emotionally Abused: Not on file  . Physically Abused: Not on file  . Sexually Abused: Not on file      PHYSICAL EXAM  Vitals:   12/08/19 0943 12/08/19 1017  BP: (!) 195/76 124/68  Pulse: 71   Weight: 164 lb 9.6 oz (74.7 kg)   Height: $Remove'5\' 6"'VKajuid$  (1.676 m)    Body mass index is 26.57 kg/m.  Generalized: Well developed, in no acute distress  Cardiology: normal rate and  rhythm, no murmur noted Respiratory: clear to auscultation bilaterally  Neurological examination  Mentation: Alert oriented to time, place, history taking. Follows all commands speech and language fluent Cranial nerve II-XII: Pupils were equal round reactive to light. Extraocular movements were full, visual field were full  Motor: The motor testing reveals 5 over 5 strength of all 4 extremities. Good symmetric motor tone is noted throughout.  Gait and station: Gait is normal.    DIAGNOSTIC DATA (LABS, IMAGING, TESTING) - I reviewed patient records, labs, notes, testing and imaging myself where available.  No flowsheet data found.   Lab Results  Component Value Date   WBC 8.8 05/26/2015   HGB 16.0 08/21/2017   HCT 47.0 08/21/2017   MCV 92.3 05/26/2015   PLT 288 05/26/2015      Component Value Date/Time   NA 140 07/18/2019 0912   K 5.1 07/18/2019 0912   CL 105 07/18/2019 0912   CO2 28 07/18/2019 0912   GLUCOSE 90 07/18/2019 0912  BUN 30 (H) 07/18/2019 0912   CREATININE 1.16 07/18/2019 0912   CALCIUM 9.8 07/18/2019 0912   PROT 7.0 07/18/2019 0912   ALBUMIN 3.5 05/19/2015 0224   AST 19 07/18/2019 0912   ALT 21 07/18/2019 0912   ALKPHOS 45 05/19/2015 0224   BILITOT 0.4 07/18/2019 0912   GFRNONAA 60 07/18/2019 0912   GFRAA 70 07/18/2019 0912   Lab Results  Component Value Date   CHOL 127 07/18/2019   HDL 41 07/18/2019   LDLCALC 71 07/18/2019   TRIG 74 07/18/2019   CHOLHDL 3.1 07/18/2019   Lab Results  Component Value Date   HGBA1C 6.0 (H) 05/21/2015   No results found for: VITAMINB12 Lab Results  Component Value Date   TSH 0.13 (L) 04/18/2019       ASSESSMENT AND PLAN 79 y.o. 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  here with     ICD-10-CM   1. OSA on CPAP  G47.33    Z99.89      Compliance report reveals excellent compliance.  He does continue to have significant leak.  He is happy with the fit of his mask and does not feel that a mask refitting will be beneficial.  I reviewed sleep study showing "severe obstructive sleep apnea, with a total AHI of 24.8/hour, REM AHI of 52.5/hour, supine AHI of 60.4/hour and O2 nadir of 81%".  Residual apnea male 0.6 events per hour.  He was encouraged to continue monitoring for correct mask seal at home.  He will notify me of any concerns of elevated apneic events as indicated on his CPAP machine.  Patient here is most likely contributor.  He was encouraged to continue healthy lifestyle habits.  He will continue close follow-up with primary care as directed.  He will follow-up with me in 1 year, sooner if needed.  He verbalizes understanding and agreement with this plan.  No orders of the defined types were placed in this encounter.    No orders of the defined types were placed in this encounter.     I spent 15 minutes with the patient. 50% of this time was spent counseling and educating patient on plan of care and medications.    Debbora Presto, FNP-C 12/08/2019, 11:48 AM [facility] [address], [place], [place] [address] [phone]  I reviewed the above note and documentation by the Nurse Practitioner and agree with the history, exam, assessment and plan as outlined above. I was available for consultation. Star Age, MD, PhD [facility])

## 2019-12-08 NOTE — Patient Instructions (Addendum)
Please continue using your CPAP regularly. While your insurance requires that you use CPAP at least 4 hours each night on 70% of the nights, I recommend, that you not skip any nights and use it throughout the night if you can. Getting used to CPAP and staying with the treatment long term does take time and patience and discipline. Untreated obstructive sleep apnea when it is moderate to severe can have an adverse impact on cardiovascular health and raise her risk for heart disease, arrhythmias, hypertension, congestive heart failure, stroke and diabetes. Untreated obstructive sleep apnea causes sleep disruption, nonrestorative sleep, and sleep deprivation. This can have an impact on your day to day functioning and cause daytime sleepiness and impairment of cognitive function, memory loss, mood disturbance, and problems focussing. Using CPAP regularly can improve these symptoms.   Continue monitoring for leak. Let me know if you have any concerns of worsening.   Follow up in 1 year    Sleep Apnea Sleep apnea affects breathing during sleep. It causes breathing to stop for a short time or to become shallow. It can also increase the risk of:  Heart attack.  Stroke.  Being very overweight (obese).  Diabetes.  Heart failure.  Irregular heartbeat. The goal of treatment is to help you breathe normally again. What are the causes? There are three kinds of sleep apnea:  Obstructive sleep apnea. This is caused by a blocked or collapsed airway.  Central sleep apnea. This happens when the brain does not send the right signals to the muscles that control breathing.  Mixed sleep apnea. This is a combination of obstructive and central sleep apnea. The most common cause of this condition is a collapsed or blocked airway. This can happen if:  Your throat muscles are too relaxed.  Your tongue and tonsils are too large.  You are overweight.  Your airway is too small. What increases the  risk?  Being overweight.  Smoking.  Having a small airway.  Being older.  Being male.  Drinking alcohol.  Taking medicines to calm yourself (sedatives or tranquilizers).  Having family members with the condition. What are the signs or symptoms?  Trouble staying asleep.  Being sleepy or tired during the day.  Getting angry a lot.  Loud snoring.  Headaches in the morning.  Not being able to focus your mind (concentrate).  Forgetting things.  Less interest in sex.  Mood swings.  Personality changes.  Feelings of sadness (depression).  Waking up a lot during the night to pee (urinate).  Dry mouth.  Sore throat. How is this diagnosed?  Your medical history.  A physical exam.  A test that is done when you are sleeping (sleep study). The test is most often done in a sleep lab but may also be done at home. How is this treated?   Sleeping on your side.  Using a medicine to get rid of mucus in your nose (decongestant).  Avoiding the use of alcohol, medicines to help you relax, or certain pain medicines (narcotics).  Losing weight, if needed.  Changing your diet.  Not smoking.  Using a machine to open your airway while you sleep, such as: ? An oral appliance. This is a mouthpiece that shifts your lower jaw forward. ? A CPAP device. This device blows air through a mask when you breathe out (exhale). ? An EPAP device. This has valves that you put in each nostril. ? A BPAP device. This device blows air through a mask when you  breathe in (inhale) and breathe out.  Having surgery if other treatments do not work. It is important to get treatment for sleep apnea. Without treatment, it can lead to:  High blood pressure.  Coronary artery disease.  In men, not being able to have an erection (impotence).  Reduced thinking ability. Follow these instructions at home: Lifestyle  Make changes that your doctor recommends.  Eat a healthy diet.  Lose  weight if needed.  Avoid alcohol, medicines to help you relax, and some pain medicines.  Do not use any products that contain nicotine or tobacco, such as cigarettes, e-cigarettes, and chewing tobacco. If you need help quitting, ask your doctor. General instructions  Take over-the-counter and prescription medicines only as told by your doctor.  If you were given a machine to use while you sleep, use it only as told by your doctor.  If you are having surgery, make sure to tell your doctor you have sleep apnea. You may need to bring your device with you.  Keep all follow-up visits as told by your doctor. This is important. Contact a doctor if:  The machine that you were given to use during sleep bothers you or does not seem to be working.  You do not get better.  You get worse. Get help right away if:  Your chest hurts.  You have trouble breathing in enough air.  You have an uncomfortable feeling in your back, arms, or stomach.  You have trouble talking.  One side of your body feels weak.  A part of your face is hanging down. These symptoms may be an emergency. Do not wait to see if the symptoms will go away. Get medical help right away. Call your local emergency services (911 in the [place] Do not drive yourself to the hospital. Summary  This condition affects breathing during sleep.  The most common cause is a collapsed or blocked airway.  The goal of treatment is to help you breathe normally while you sleep. This information is not intended to replace advice given to you by your health care provider. Make sure you discuss any questions you have with your health care provider. Document Revised: 01/01/2018 Document Reviewed: 11/10/2017 Elsevier Patient Education  [place].

## 2020-01-16 ENCOUNTER — Other Ambulatory Visit: Payer: Self-pay | Admitting: *Deleted

## 2020-01-16 MED ORDER — METOPROLOL TARTRATE 25 MG PO TABS
25.0000 mg | ORAL_TABLET | Freq: Two times a day (BID) | ORAL | 1 refills | Status: DC
Start: 2020-01-16 — End: 2020-06-13

## 2020-01-17 ENCOUNTER — Other Ambulatory Visit (INDEPENDENT_AMBULATORY_CARE_PROVIDER_SITE_OTHER): Payer: Self-pay | Admitting: Internal Medicine

## 2020-01-26 ENCOUNTER — Other Ambulatory Visit: Payer: Self-pay

## 2020-01-26 ENCOUNTER — Encounter (INDEPENDENT_AMBULATORY_CARE_PROVIDER_SITE_OTHER): Payer: Self-pay | Admitting: Nurse Practitioner

## 2020-01-26 ENCOUNTER — Ambulatory Visit (INDEPENDENT_AMBULATORY_CARE_PROVIDER_SITE_OTHER): Payer: Medicare Other | Admitting: Nurse Practitioner

## 2020-01-26 VITALS — BP 130/67 | HR 71 | Temp 97.6°F | Resp 18 | Ht 66.0 in | Wt 159.0 lb

## 2020-01-26 DIAGNOSIS — Z0000 Encounter for general adult medical examination without abnormal findings: Secondary | ICD-10-CM

## 2020-01-26 NOTE — Patient Instructions (Addendum)
Talk to son about Hepatitis C screening.    William [name] , Thank you for taking time to come for your Medicare Wellness Visit. I appreciate your ongoing commitment to your health goals. Please review the following plan we discussed and let me know if I can assist you in the future.   These are the goals we discussed: Goals    . Blood Pressure < 140/90     Obtaining goal well! Keep it up!       This is a list of the screening recommended for you and due dates:  Health Maintenance  Topic Date Due  .  Hepatitis C: One time screening is recommended by Center for Disease Control  (CDC) for  adults born from [birth year] through 1964.   Never done  . Pneumonia vaccines (2 of 2 - PCV13) 01/28/2018  . Tetanus Vaccine  01/06/2028  . Flu Shot  Completed  . COVID-19 Vaccine  Completed

## 2020-01-26 NOTE — Progress Notes (Signed)
Subjective:   William Nay. is a 79 y.o. male who presents for Medicare Annual/Subsequent preventive examination.   Cardiac Risk Factors include: advanced age (>58men, >83 women);dyslipidemia;hypertension;male gender     Objective:    Today's Vitals   01/26/20 0953  BP: 130/67  Pulse: 71  Resp: 18  Temp: 97.6 F (36.4 C)  TempSrc: Temporal  SpO2: 98%  Weight: 159 lb (72.1 kg)  Height: 5\' 6"  (4.034 m)   Body mass index is 25.66 kg/m.  Advanced Directives 01/26/2020 02/03/2018 08/21/2017 01/07/2017 07/02/2016 07/03/2015 06/25/2015  Does Patient Have a Medical Advance Directive? Yes Yes Yes Yes No Yes Yes  Type of Industrial/product designer of [place];Living will [place];Living will - [place];Living will [place];Living will  Does patient want to make changes to medical advance directive? No - Patient declined - - - - No - Patient declined -  Copy of [place] in Chart? No - copy requested - No - copy requested No - copy requested - No - copy requested -  Would patient like information on creating a medical advance directive? No - Patient declined - - - - - -    Current Medications (verified) Outpatient Encounter Medications as of 01/26/2020  Medication Sig  . amLODipine (NORVASC) 10 MG tablet TAKE 1 TABLET BY MOUTH EVERY DAY  . Ascorbic Acid (VITAMIN C) 1000 MG tablet Take 1,000 mg by mouth daily.  Marland Kitchen aspirin EC 81 MG tablet Take 1 tablet (81 mg total) by mouth daily.  . cholecalciferol (VITAMIN D3) 25 MCG (1000 UT) tablet Take 10,000 Units by mouth daily.  . DTaP-IPV Lynann Bologna) injection Inject 5 mLs into the muscle once.  . fish oil-omega-3 fatty acids 1000 MG capsule Take 1,000 g by mouth daily.   . folic acid (FOLVITE) 742 MCG tablet Take 400 mcg by mouth daily.  . Garlic 5956 MG TABS Take 1,250 mg by mouth daily.   Marland Kitchen  lisinopril (ZESTRIL) 40 MG tablet TAKE 1 TABLET BY MOUTH EVERY DAY  . metoprolol tartrate (LOPRESSOR) 25 MG tablet Take 1 tablet (25 mg total) by mouth 2 (two) times daily.  . Misc. Devices MISC by Does not apply route. CPAP  . Multiple Vitamin (MULTIVITAMIN) tablet Take 1 tablet by mouth daily.  . NP THYROID 90 MG tablet Take 1 tablet by mouth once daily  . rosuvastatin (CRESTOR) 10 MG tablet TAKE 1 TABLET BY MOUTH EVERY DAY  . thiamine (VITAMIN B-1) 100 MG tablet Take 100 mg by mouth every evening.   . vitamin E 400 UNIT capsule Take 400 Units by mouth daily.  . [DISCONTINUED] SPIRIVA HANDIHALER 18 MCG inhalation capsule Place 18 mcg into inhaler and inhale daily.   . [DISCONTINUED] NP THYROID 90 MG tablet Take 1 tablet by mouth once daily   No facility-administered encounter medications on file as of 01/26/2020.    Allergies (verified) Patient has no known allergies.   History: Past Medical History:  Diagnosis Date  . Carotid artery occlusion   . Colon polyp   . COPD (chronic obstructive pulmonary disease) ([place])   . Coronary artery disease   . Hyperlipidemia   . Hypertension   . 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 Myocardial infarction ([place])   . Peripheral arterial disease [facility])    Past Surgical History:  Procedure Laterality Date  . ABDOMINAL AORTOGRAM W/LOWER EXTREMITY N/A 08/21/2017  Procedure: ABDOMINAL AORTOGRAM W/LOWER EXTREMITY;  Surgeon: Angelia Mould, MD;  Location: [facility];  Service: Cardiovascular;  Laterality: N/A;  . Bilateral renal  artery stenoses  04/23/2009  . CARDIAC CATHETERIZATION N/A 05/21/2015   Procedure: Left Heart Cath and Coronary Angiography;  Surgeon: Lorretta Harp, MD;  Location: [place] [facility];  Service: Cardiovascular;  Laterality: N/A;  . CORONARY ARTERY BYPASS GRAFT N/A 05/22/2015   Procedure: CORONARY ARTERY BYPASS GRAFTING (CABG) LIMA-LAD and SVG-OM EVH RIGHT THIGH GREATER SAPHENOUS VEIN;  Surgeon: [name], MD;   Location: [facility];  Service: Open Heart Surgery;  Laterality: N/A;  . HIATAL HERNIA REPAIR    . Percutaneous translumnial angioplasty  04/23/2009   Catheterization of [place]efst external iliac artery with Left lower extremity runoff  . TEE WITHOUT CARDIOVERSION N/A 05/22/2015   Procedure: TRANSESOPHAGEAL ECHOCARDIOGRAM (TEE);  Surgeon: [name], MD;  Location: [facility];  Service: Open Heart Surgery;  Laterality: N/A;   Family History  Problem Relation Age of Onset  . Heart disease Father        Heart Disease before age 34  . Pulmonary embolism Father   . Deep vein thrombosis Father   . Cancer Mother        Sarcoma   Social History   Socioeconomic History  . Marital status: Married    Spouse name: Not on file  . Number of children: Not on file  . Years of education: Not on file  . Highest education level: Not on file  Occupational History  . Not on file  Tobacco Use  . Smoking status: Former Smoker    Packs/day: 2.00    Years: 30.00    Pack years: 60.00    Types: Cigarettes    Start date: 06/03/1961    Quit date: 11/17/1991    Years since quitting: 28.2  . Smokeless tobacco: Former Systems developer    Types: Snuff  Vaping Use  . Vaping Use: Never used  Substance and Sexual Activity  . Alcohol use: Not Currently    Alcohol/week: 2.0 - 4.0 standard drinks    Types: 2 - 4 Standard drinks or equivalent per week    Comment: used to   . Drug use: No  . Sexual activity: Not Currently  Other Topics Concern  . Not on file  Social History Narrative   Retired used to work in [employer]. Widower. Lives alone.   Social Determinants of Health   Financial Resource Strain:   . Difficulty of Paying Living Expenses: Not on file  Food Insecurity:   . Worried About Charity fundraiser in the Last Year: Not on file  . Ran Out of Food in the Last Year: Not on file  Transportation Needs:   . Lack of Transportation (Medical): Not on file  . Lack of Transportation (Non-Medical): Not on file    Physical Activity:   . Days of Exercise per Week: Not on file  . Minutes of Exercise per Session: Not on file  Stress:   . Feeling of Stress : Not on file  Social Connections:   . Frequency of Communication with Friends and Family: Not on file  . Frequency of Social Gatherings with Friends and Family: Not on file  . Attends Religious Services: Not on file  . Active Member of Clubs or Organizations: Not on file  . Attends Archivist Meetings: Not on file  . Marital Status: Not on file    Tobacco  Counseling Counseling given: Not Answered   Clinical Intake:  Pre-visit preparation completed: Yes  Pain : No/denies pain     BMI - recorded: 25.66 Nutritional Status: BMI 25 -29 Overweight Nutritional Risks: None Diabetes: No  How often do you need to have someone help you when you read instructions, pamphlets, or other written materials from your doctor or pharmacy?: 1 - Never What is the last grade level you completed in school?: 12th grade  Diabetic? No  Interpreter Needed?: No  Information entered by :: Jeralyn Ruths, NP-C   Activities of Daily Living In your present state of health, do you have any difficulty performing the following activities: 01/26/2020  Hearing? Y  Vision? N  Difficulty concentrating or making decisions? N  Walking or climbing stairs? Y  Dressing or bathing? N  Doing errands, shopping? N  Preparing Food and eating ? N  Using the Toilet? N  In the past six months, have you accidently leaked urine? N  Do you have problems with loss of bowel control? N  Managing your Medications? N  Managing your Finances? N  Housekeeping or managing your Housekeeping? N  Some recent data might be hidden    Patient Care Team: Ailene Ards, NP as PCP - General (Nurse Practitioner) Herminio Commons, MD (Inactive) as PCP - Cardiology (Cardiology)  Indicate any recent Medical Services you may have received from other than [facility] providers in the  past year (date may be approximate).     Assessment:   This is a routine wellness examination for [place].  Hearing/Vision screen  Hearing Screening   125Hz  250Hz  500Hz  1000Hz  2000Hz  3000Hz  4000Hz  6000Hz  8000Hz   Right ear:           Left ear:             Visual Acuity Screening   Right eye Left eye Both eyes  Without correction: 20/25 20/13 20/13   With correction:       Dietary issues and exercise activities discussed: Current Exercise Habits: Home exercise routine, Type of exercise: strength training/weights, Time (Minutes): 15, Frequency (Times/Week): 3, Weekly Exercise (Minutes/Week): 45, Intensity: Moderate, Exercise limited by: cardiac condition(s);orthopedic condition(s)  Goals    . Blood Pressure < 140/90     Obtaining goal well! Keep it up!      Depression Screen PHQ 2/9 Scores 01/26/2020 07/18/2019 04/18/2019 01/25/2019 10/24/2015 07/03/2015  PHQ - 2 Score 0 0 0 2 0 1  PHQ- 9 Score - - - 5 0 1  Exception Documentation - Medical reason - - - -    Fall Risk Fall Risk  01/26/2020 07/18/2019 04/18/2019 01/25/2019 02/23/2018  Falls in the past year? 0 0 0 0 1  Number falls in past yr: 0 0 0 0 0  Injury with Fall? 0 0 0 0 1  Comment - - - - Patient stated that he hit his head and had a skin tear to his left hand  Risk for fall due to : Impaired vision No Fall Risks - - -  Follow up Education provided;Falls prevention discussed Falls evaluation completed - Falls evaluation completed -    Any stairs in or around the home? Yes  If so, are there any without handrails? No  Home free of loose throw rugs in walkways, pet beds, electrical cords, etc? No  Adequate lighting in your home to reduce risk of falls? Yes   ASSISTIVE DEVICES UTILIZED TO PREVENT FALLS:  Life alert? No  Use of a cane,  walker or w/c? Yes  Grab bars in the bathroom? No  Shower chair or bench in shower? No  Elevated toilet seat or a handicapped toilet? No   TIMED UP AND GO:  Was the test performed?  Yes .  Length of time to ambulate 10 feet: 10 sec.   Gait slow and steady with assistive device  Cognitive Function:   Montreal Cognitive Assessment  01/25/2019  Visuospatial/ Executive (0/5) 3  Naming (0/3) 2  Attention: Read list of digits (0/2) 2  Attention: Read list of letters (0/1) 1  Attention: Serial 7 subtraction starting at 100 (0/3) 3  Language: Repeat phrase (0/2) 0  Language : Fluency (0/1) 0  Abstraction (0/2) 2  Delayed Recall (0/5) 2  Orientation (0/6) 5  Total 20  Adjusted Score (based on education) 21   6CIT Screen 01/26/2020  What Year? 0 points  What month? 0 points  What time? 0 points  Count back from 20 0 points  Months in reverse 0 points  Repeat phrase 0 points  Total Score 0    Immunizations Immunization History  Administered Date(s) Administered  . Fluad Quad(high Dose 65+) 01/25/2019, 01/26/2020  . Moderna SARS-COVID-2 Vaccination 05/21/2019, 06/18/2019  . Pneumococcal Polysaccharide-23 01/28/2017  . Tdap 01/05/2018    TDAP status: Up to date Flu Vaccine status: Completed at today's visit Pneumococcal vaccine status: Up to date Covid-19 vaccine status: Completed vaccines  Qualifies for Shingles Vaccine? No   Zostavax completed No   Shingrix Completed?: Yes  Screening Tests Health Maintenance  Topic Date Due  . Hepatitis C Screening  Never done  . PNA vac Low Risk Adult (2 of 2 - PCV13) 01/28/2018  . TETANUS/TDAP  01/06/2028  . INFLUENZA VACCINE  Completed  . COVID-19 Vaccine  Completed    Health Maintenance  Health Maintenance Due  Topic Date Due  . Hepatitis C Screening  Never done  . PNA vac Low Risk Adult (2 of 2 - PCV13) 01/28/2018    Colorectal cancer screening: No longer required.   Lung Cancer Screening: (Low Dose CT Chest recommended if Age 73-80 years, 30 pack-year currently smoking OR have quit w/in 15years.) does not qualify.   Lung Cancer Screening Referral: N/A  Additional Screening:  Hepatitis C  Screening: does qualify; Completed - patient will hold off today but will think about it  Vision Screening: Recommended annual ophthalmology exams for early detection of glaucoma and other disorders of the eye. Is the patient up to date with their annual eye exam?  Yes  Who is the provider or what is the name of the office in which the patient attends annual eye exams? [facility] in [place], [place] If pt is not established with a provider, would they like to be referred to a provider to establish care? No .   Dental Screening: Recommended annual dental exams for proper oral hygiene  Community Resource Referral / Chronic Care Management: CRR required this visit?  No   CCM required this visit?  No      Plan:      Flu shot administered today.  He will consider getting hepatitis C screening completed at next office visit.  I have personally reviewed and noted the following in the patient's chart:   . Medical and social history . Use of alcohol, tobacco or illicit drugs  . Current medications and supplements . Functional ability and status . Nutritional status . Physical activity . Advanced directives . List of other physicians .  Hospitalizations, surgeries, and ER visits in previous 12 months . Vitals . Screenings to include cognitive, depression, and falls . Referrals and appointments  In addition, I have reviewed and discussed with patient certain preventive protocols, quality metrics, and best practice recommendations. A written personalized care plan for preventive services as well as general preventive health recommendations were provided to patient.    Patient will follow up in 2 months or sooner as needed.  He will have his annual wellness visit again next year.  Ailene Ards, NP   01/26/2020

## 2020-02-04 DIAGNOSIS — Z23 Encounter for immunization: Secondary | ICD-10-CM | POA: Diagnosis not present

## 2020-02-13 ENCOUNTER — Other Ambulatory Visit (INDEPENDENT_AMBULATORY_CARE_PROVIDER_SITE_OTHER): Payer: Self-pay | Admitting: Nurse Practitioner

## 2020-02-16 ENCOUNTER — Other Ambulatory Visit: Payer: Self-pay | Admitting: *Deleted

## 2020-02-16 MED ORDER — ROSUVASTATIN CALCIUM 10 MG PO TABS
10.0000 mg | ORAL_TABLET | Freq: Every day | ORAL | 0 refills | Status: DC
Start: 2020-02-16 — End: 2020-05-11

## 2020-02-22 ENCOUNTER — Other Ambulatory Visit: Payer: Self-pay | Admitting: *Deleted

## 2020-02-22 MED ORDER — LISINOPRIL 40 MG PO TABS
40.0000 mg | ORAL_TABLET | Freq: Every day | ORAL | 1 refills | Status: DC
Start: 2020-02-22 — End: 2020-06-13

## 2020-04-03 ENCOUNTER — Encounter (INDEPENDENT_AMBULATORY_CARE_PROVIDER_SITE_OTHER): Payer: Self-pay | Admitting: Nurse Practitioner

## 2020-04-03 ENCOUNTER — Telehealth (INDEPENDENT_AMBULATORY_CARE_PROVIDER_SITE_OTHER): Payer: Self-pay | Admitting: Nurse Practitioner

## 2020-04-03 ENCOUNTER — Telehealth (INDEPENDENT_AMBULATORY_CARE_PROVIDER_SITE_OTHER): Payer: Medicare Other | Admitting: Nurse Practitioner

## 2020-04-03 ENCOUNTER — Other Ambulatory Visit: Payer: Self-pay

## 2020-04-03 VITALS — BP 133/65 | HR 83 | Temp 97.1°F | Ht 66.0 in | Wt 161.0 lb

## 2020-04-03 DIAGNOSIS — I10 Essential (primary) hypertension: Secondary | ICD-10-CM

## 2020-04-03 DIAGNOSIS — E785 Hyperlipidemia, unspecified: Secondary | ICD-10-CM | POA: Diagnosis not present

## 2020-04-03 DIAGNOSIS — J449 Chronic obstructive pulmonary disease, unspecified: Secondary | ICD-10-CM | POA: Diagnosis not present

## 2020-04-03 DIAGNOSIS — I25118 Atherosclerotic heart disease of native coronary artery with other forms of angina pectoris: Secondary | ICD-10-CM | POA: Diagnosis not present

## 2020-04-03 DIAGNOSIS — E559 Vitamin D deficiency, unspecified: Secondary | ICD-10-CM | POA: Diagnosis not present

## 2020-04-03 MED ORDER — NITROGLYCERIN 0.4 MG SL SUBL: 0.4000 mg | SUBLINGUAL_TABLET | SUBLINGUAL | 3 refills | Status: DC | PRN

## 2020-04-03 NOTE — Telephone Encounter (Signed)
Please print and mail patient's AVS from office visit date 04/03/2020 to patient's home address.  Thank you.

## 2020-04-03 NOTE — Progress Notes (Signed)
Due to national recommendations of social distancing related to the [place] pandemic, an audio-only tele-health visit was felt to be the most appropriate encounter type for this patient today. I connected with  William [name]. on 04/03/20 utilizing audio-only technology and verified that I am speaking with the correct person using two identifiers. The patient was located at their home, and I was located at the office of [facility] during the encounter. I discussed the limitations of evaluation and management by telemedicine. The patient expressed understanding and agreed to proceed.    Subjective:  Patient ID: William [name]., male    DOB: 08-Aug-1940  Age: 80 y.o. MRN: [id number]  CC:  Chief Complaint  Patient presents with  . Hypertension  . Hyperlipidemia  . Other    Vitamin D deficiency  . Coronary Artery Disease  . COPD      HPI  This patient arrives today for virtual visit for the above.  Hypertension: He has a history of hypertension and continues on his amlodipine, lisinopril, and metoprolol.  He is tolerating his medications well.  Vital signs were collected using his at home machine.  Hyperlipidemia: He also has a history of hyperlipidemia.  He continues on aspirin, fish oil, and rosuvastatin.  Last LDL was 71 and this was collected approximately 8 months ago.  Vitamin D deficiency: Last serum check was collected about 1 year ago and it was 43.  At that time he was on 10,000 IUs of vitamin D3 daily.  He tells me today that he reduced his intake down to 5000 IUs of vitamin D3 because he noticed some loose stools which he thought could be related to his vitamin D3 intake.  He tells me that his stools have gotten a bit better but are still loose at times.  Coronary artery disease: He also has a history of coronary artery disease, CABG, and NSTEMI.  He does report intermittent chest pain when he feels that he is overly exerting himself.  He tells me the last  episode was approximately 2 weeks ago.  He used to follow with cardiology, however his cardiologist had left the area.  He has not been established with another cardiologist since then.  He denies any use of erectile dysfunction medication or being on isosorbide.  He does not have any nitroglycerin tablets available to him.  He tells me whenever he feels chest pain as long as he rests the chest pain resolves on its own.  COPD: He is not currently on any medications for treatment of his COPD.  He denies any exacerbations requiring hospitalization or prednisone within the last year.  He is up-to-date with flu, PPSV23, and Covid19 vaccinations.  Past Medical History:  Diagnosis Date  . Carotid artery occlusion   . Colon polyp   . COPD (chronic obstructive pulmonary disease) ([place])   . Coronary artery disease   . Hyperlipidemia   . Hypertension   . 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 Myocardial infarction ([place])   . Peripheral arterial disease ([facility])       Family History  Problem Relation Age of Onset  . Heart disease Father        Heart Disease before age 65  . Pulmonary embolism Father   . Deep vein thrombosis Father   . Cancer Mother        Sarcoma    Social History   Social History Narrative   Retired used to work in [employer].  Widower. Lives alone.   Social History   Tobacco Use  . Smoking status: Former Smoker    Packs/day: 2.00    Years: 30.00    Pack years: 60.00    Types: Cigarettes    Start date: 06/03/1961    Quit date: 11/17/1991    Years since quitting: 28.3  . Smokeless tobacco: Former Systems developer    Types: Snuff  Substance Use Topics  . Alcohol use: Not Currently    Alcohol/week: 2.0 - 4.0 standard drinks    Types: 2 - 4 Standard drinks or equivalent per week    Comment: used to      Current Meds  Medication Sig  . amLODipine (NORVASC) 10 MG tablet TAKE 1 TABLET BY MOUTH EVERY DAY  . Ascorbic Acid (VITAMIN C) 1000 MG tablet Take 1,000 mg by mouth daily.  Marland Kitchen  aspirin EC 81 MG tablet Take 1 tablet (81 mg total) by mouth daily.  . Cholecalciferol 1.25 MG (50000 UT) TABS Take 1 tablet by mouth daily.  . fish oil-omega-3 fatty acids 1000 MG capsule Take 1,000 g by mouth daily.   . folic acid (FOLVITE) [id number] MCG tablet Take 400 mcg by mouth daily.  . Garlic [id number] MG TABS Take 1,250 mg by mouth daily.   Marland Kitchen lisinopril (ZESTRIL) 40 MG tablet Take 1 tablet (40 mg total) by mouth daily.  . metoprolol tartrate (LOPRESSOR) 25 MG tablet Take 1 tablet (25 mg total) by mouth 2 (two) times daily.  . Misc. Devices MISC by Does not apply route. CPAP  . Multiple Vitamin (MULTIVITAMIN) tablet Take 1 tablet by mouth daily.  . nitroGLYCERIN (NITROSTAT) 0.4 MG SL tablet Place 1 tablet (0.4 mg total) under the tongue every 5 (five) minutes as needed for chest pain. If pain does not resolve with rest.  . rosuvastatin (CRESTOR) 10 MG tablet Take 1 tablet (10 mg total) by mouth daily.  Marland Kitchen thiamine (VITAMIN B-1) 100 MG tablet Take 100 mg by mouth every evening.   . vitamin E 400 UNIT capsule Take 400 Units by mouth daily.  . [DISCONTINUED] cholecalciferol (VITAMIN D3) 25 MCG (1000 UT) tablet Take 10,000 Units by mouth daily.    ROS:  Review of Systems  Eyes: Negative for blurred vision.  Respiratory: Positive for cough and shortness of breath.   Cardiovascular: Positive for chest pain.  Neurological: Negative for dizziness and headaches.     Objective:   Today's Vitals: BP 133/65   Pulse 83   Temp (!) 97.1 F (36.2 C)   Ht 5\' 6"  (1.676 m)   Wt 161 lb (73 kg)   SpO2 94%   BMI 25.99 kg/m  Vitals with BMI 04/03/2020 01/26/2020 12/08/2019  Height 5\' 6"  5\' 6"  -  Weight 161 lbs 159 lbs -  BMI 26 [id number] -  Systolic [id number]  Diastolic 65 67 68  Pulse 83 71 -     Physical Exam Comprehensive physical exam not conducted today as office visit was conducted remotely.  He was alert and oriented and appeared to have appropriate thought processes and  judgment.      Assessment and Plan   1. Coronary artery disease of native heart with stable angina pectoris, unspecified vessel or lesion type ([place])   2. Hyperlipidemia, unspecified hyperlipidemia type   3. Essential hypertension   4. Vitamin D deficiency   5. Chronic obstructive pulmonary disease, unspecified COPD type ([place])      Plan: 1.  Had extensive conversation about using  nitroglycerin sublingual tabs as needed.  We will send prescription to pharmacy.  I encouraged him to call his cardiologist office to let them know that he needs to be established with a new cardiologist.  He tells me he will.  We will also send him information in writing regarding nitroglycerin tablets as this appears to be a new medication for him.  He was told that if he needs to take 3 tablets due to chest pain he needs to call 911.  He tells me he understands. 2.  He will continue on his medications as prescribed may consider checking lipid panel at next office visit. 3.  Blood pressure reasonably controlled on current regimen will have him continue that regimen for the time being. 4.  We will consider checking serum level of vitamin D3 at next office visit.  For now continue taking his supplement. 5.  Appears to be stable at this time, will consider maintenance therapy in the future if needed.  Of note, I believe this patient is due for hepatitis C screening.  Consider offering this to him at next office visit.   Tests ordered No orders of the defined types were placed in this encounter.     Meds ordered this encounter  Medications  . nitroGLYCERIN (NITROSTAT) 0.4 MG SL tablet    Sig: Place 1 tablet (0.4 mg total) under the tongue every 5 (five) minutes as needed for chest pain. If pain does not resolve with rest.    Dispense:  50 tablet    Refill:  3    Order Specific Question:   Supervising Provider    Answer:   Wilson Singer [[id number]]    Patient to follow-up in 3 months or sooner as needed.   Telephone conversation lasted for approximately 12 minutes today.  [name], NP

## 2020-04-03 NOTE — Patient Instructions (Signed)
You were prescribed nitroglycerin under the tongue tablets to take as needed for chest pain that does not resolve with rest.  As we discussed at the office visit if you experience chest pain stop what ever activity you are doing and rest for few minutes.  If the chest pain does not resolve as it normally does, place 1 tablet under the tongue.  Wait 5 minutes and if the chest pain continues place 1 tablet under the tongue.  Wait an additional 5 minutes and if the chest pain continues place 1 tablet under the tongue and call 911.  Make sure that you call your previous cardiologist office and asked to be scheduled to see a different cardiologist so that they can continue to monitor for chronic heart disease.  I have provided additional information about nitroglycerin tablets below, please also consult with your son if you have any questions or the pharmacist at [facility] may also call this office if you have a question as well.  Nitroglycerin sublingual tablets What is this medicine? NITROGLYCERIN (nye troe GLI ser in) is a type of vasodilator. It relaxes blood vessels, increasing the blood and oxygen supply to your heart. This medicine is used to relieve chest pain caused by angina. It is also used to prevent chest pain before activities like climbing stairs, going outdoors in cold weather, or sexual activity. This medicine may be used for other purposes; ask your health care provider or pharmacist if you have questions. COMMON BRAND NAME(S): Nitroquick, Nitrostat, Nitrotab What should I tell my health care provider before I take this medicine? They need to know if you have any of these conditions:  anemia  head injury, recent stroke, or bleeding in the brain  liver disease  previous heart attack  an unusual or allergic reaction to nitroglycerin, other medicines, foods, dyes, or preservatives  pregnant or trying to get pregnant  breast-feeding How should I use this medicine? Take this  medicine by mouth as needed. At the first sign of an angina attack (chest pain or tightness) place one tablet under your tongue. You can also take this medicine 5 to 10 minutes before an event likely to produce chest pain. Follow the directions on the prescription label. Let the tablet dissolve under the tongue. Do not swallow whole. Replace the dose if you accidentally swallow it. It will help if your mouth is not dry. Saliva around the tablet will help it to dissolve more quickly. Do not eat or drink, smoke or chew tobacco while a tablet is dissolving. If you are not better within 5 minutes after taking ONE dose of nitroglycerin, call 9-1-1 immediately to seek emergency medical care. Do not take more than 3 nitroglycerin tablets over 15 minutes. If you take this medicine often to relieve symptoms of angina, your doctor or health care professional may provide you with different instructions to manage your symptoms. If symptoms do not go away after following these instructions, it is important to call 9-1-1 immediately. Do not take more than 3 nitroglycerin tablets over 15 minutes. Talk to your pediatrician regarding the use of this medicine in children. Special care may be needed. Overdosage: If you think you have taken too much of this medicine contact a poison control center or emergency room at once. NOTE: This medicine is only for you. Do not share this medicine with others. What if I miss a dose? This does not apply. This medicine is only used as needed. What may interact with this  medicine? Do not take this medicine with any of the following medications:  certain migraine medicines like ergotamine and dihydroergotamine (DHE)  medicines used to treat erectile dysfunction like sildenafil, tadalafil, and vardenafil  riociguat This medicine may also interact with the following medications:  alteplase  aspirin  heparin  medicines for high blood pressure  medicines for mental  depression  other medicines used to treat angina  phenothiazines like chlorpromazine, mesoridazine, prochlorperazine, thioridazine This list may not describe all possible interactions. Give your health care provider a list of all the medicines, herbs, non-prescription drugs, or dietary supplements you use. Also tell them if you smoke, drink alcohol, or use illegal drugs. Some items may interact with your medicine. What should I watch for while using this medicine? Tell your doctor or health care professional if you feel your medicine is no longer working. Keep this medicine with you at all times. Sit or lie down when you take your medicine to prevent falling if you feel dizzy or faint after using it. Try to remain calm. This will help you to feel better faster. If you feel dizzy, take several deep breaths and lie down with your feet propped up, or bend forward with your head resting between your knees. You may get drowsy or dizzy. Do not drive, use machinery, or do anything that needs mental alertness until you know how this drug affects you. Do not stand or sit up quickly, especially if you are an older patient. This reduces the risk of dizzy or fainting spells. Alcohol can make you more drowsy and dizzy. Avoid alcoholic drinks. Do not treat yourself for coughs, colds, or pain while you are taking this medicine without asking your doctor or health care professional for advice. Some ingredients may increase your blood pressure. What side effects may I notice from receiving this medicine? Side effects that you should report to your doctor or health care professional as soon as possible:  blurred vision  dry mouth  skin rash  sweating  the feeling of extreme pressure in the head  unusually weak or tired Side effects that usually do not require medical attention (report to your doctor or health care professional if they continue or are bothersome):  flushing of the face or  neck  headache  irregular heartbeat, palpitations  nausea, vomiting This list may not describe all possible side effects. Call your doctor for medical advice about side effects. You may report side effects to FDA at 1-800-FDA-1088. Where should I keep my medicine? Keep out of the reach of children. Store at room temperature between 20 and 25 degrees C (68 and 77 degrees F). Store in Retail buyer. Protect from light and moisture. Keep tightly closed. Throw away any unused medicine after the expiration date. NOTE: This sheet is a summary. It may not cover all possible information. If you have questions about this medicine, talk to your doctor, pharmacist, or health care provider.  2020 Elsevier/Gold Standard (2013-01-13 17:57:36)

## 2020-04-18 ENCOUNTER — Ambulatory Visit (INDEPENDENT_AMBULATORY_CARE_PROVIDER_SITE_OTHER): Payer: Medicare Other | Admitting: Vascular Surgery

## 2020-04-18 ENCOUNTER — Ambulatory Visit (HOSPITAL_COMMUNITY)
Admission: RE | Admit: 2020-04-18 | Discharge: 2020-04-18 | Disposition: A | Discharge status: Home / Self Care | Payer: Medicare Other | Source: Ambulatory Visit | Attending: Vascular Surgery | Admitting: Vascular Surgery

## 2020-04-18 ENCOUNTER — Other Ambulatory Visit: Payer: Self-pay

## 2020-04-18 ENCOUNTER — Ambulatory Visit: Payer: Medicare Other | Admitting: Vascular Surgery

## 2020-04-18 ENCOUNTER — Encounter: Payer: Self-pay | Admitting: Vascular Surgery

## 2020-04-18 ENCOUNTER — Encounter (HOSPITAL_COMMUNITY): Payer: Medicare Other

## 2020-04-18 ENCOUNTER — Ambulatory Visit (INDEPENDENT_AMBULATORY_CARE_PROVIDER_SITE_OTHER)
Admission: RE | Admit: 2020-04-18 | Discharge: 2020-04-18 | Disposition: A | Discharge status: Home / Self Care | Payer: Medicare Other | Source: Ambulatory Visit | Attending: Vascular Surgery | Admitting: Vascular Surgery

## 2020-04-18 VITALS — BP 139/72 | HR 70 | Temp 98.4°F | Resp 20 | Ht 66.0 in | Wt 164.0 lb

## 2020-04-18 DIAGNOSIS — I739 Peripheral vascular disease, unspecified: Secondary | ICD-10-CM | POA: Diagnosis not present

## 2020-04-18 DIAGNOSIS — I771 Stricture of artery: Secondary | ICD-10-CM

## 2020-04-18 DIAGNOSIS — I6523 Occlusion and stenosis of bilateral carotid arteries: Secondary | ICD-10-CM | POA: Diagnosis not present

## 2020-04-18 NOTE — Progress Notes (Signed)
REASON FOR VISIT:   Follow-up of peripheral vascular disease and carotid disease  MEDICAL ISSUES:   PERIPHERAL VASCULAR DISEASE: Based on his arteriogram in 2019 the patient has multilevel arterial occlusive disease.  Currently he complains of claudication and some occasional paresthesias in his right foot at night.  He has had no ulcers.  I explained that his ABIs are very low and that we might normally consider arteriography.  However, he is having some issues with chest pain and rather have this addressed first.  I explained that if his symptoms worsen or he develops a wound on his foot then certainly we need to repeat his arteriogram as its been over 3 years.  Fortunately he quit smoking many years ago.  I encouraged him to stay as active as possible.  Have ordered follow-up ABIs in 6 months and I will see him back at that time.  He knows to call sooner if he has problems.  BILATERAL CAROTID BRUITS: The patient does have bilateral carotid bruits.  He previously had a moderate bilateral carotid stenosis.  However, he has no significant carotid stenosis on either side today.  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  He has no left upper extremity symptoms.  Therefore I would not recommend aggressive approach to his left subclavian stenosis.  He should have his blood pressure taken in the right arm however.  CHEST PAIN: He has been having some chest pressure and has an appointment with cardiology in the near future.   HPI:   William [name]. is a pleasant 80 y.o. male who I last did a telemetry visit with in December 2020.  I been following him with peripheral vascular disease.  He did undergo an arteriogram in May 2019 and had multilevel arterial occlusive disease with no good options for endovascular approach.  I felt we would consider right axillobifemoral bypass grafting and right femoral  endarterectomy if developed critical limb ischemia.  He also had moderate carotid disease bilaterally.  He also has a known left subclavian stenosis.  He comes in for routine follow-up visit.  Since I saw him last he has stable claudication of both lower extremities.  He gets pain in the calves which is brought on by ambulation and relieved with rest.  His symptoms are worse on the right side.  He denies any history of nonhealing wounds.  He denies any rest pain.  He does state he occasionally gets paresthesias in his right foot at night gets up and walks around to relieve his symptoms.  He quit smoking many years ago. He denies any history of stroke, TIAs, expressive or receptive aphasia or amaurosis fugax.  He is had no significant problems with dizziness.  He has no left upper extremity symptoms.  He has had some chest pressure recently and does have a history of coronary artery disease.  He is scheduled to see his cardiologist   Past Medical History:  Diagnosis Date  . Carotid artery occlusion   . Colon polyp   . COPD (chronic obstructive pulmonary disease) ([place])   . Coronary artery disease   . Hyperlipidemia   . Hypertension   . 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 Myocardial infarction ([place])   . Peripheral arterial disease ([facility])     Family History  Problem Relation Age of Onset  . Heart disease Father  Heart Disease before age 62  . Pulmonary embolism Father   . Deep vein thrombosis Father   . Cancer Mother        Sarcoma    SOCIAL HISTORY: Social History   Tobacco Use  . Smoking status: Former Smoker    Packs/day: 2.00    Years: 30.00    Pack years: 60.00    Types: Cigarettes    Start date: 06/03/1961    Quit date: 11/17/1991    Years since quitting: 28.4  . Smokeless tobacco: Former Systems developer    Types: Snuff  Substance Use Topics  . Alcohol use: Not Currently    Alcohol/week: 2.0 - 4.0 standard drinks    Types: 2 - 4 Standard drinks or equivalent per week     Comment: used to     No Known Allergies  Current Outpatient Medications  Medication Sig Dispense Refill  . amLODipine (NORVASC) 10 MG tablet TAKE 1 TABLET BY MOUTH EVERY DAY 90 tablet 0  . Ascorbic Acid (VITAMIN C) 1000 MG tablet Take 1,000 mg by mouth daily.    Marland Kitchen aspirin EC 81 MG tablet Take 1 tablet (81 mg total) by mouth daily.    . Cholecalciferol 1.25 MG (50000 UT) TABS Take 1 tablet by mouth daily.    . fish oil-omega-3 fatty acids 1000 MG capsule Take 1,000 g by mouth daily.     . folic acid (FOLVITE) A999333 MCG tablet Take 400 mcg by mouth daily.    . Garlic [id number] MG TABS Take 1,250 mg by mouth daily.     Marland Kitchen lisinopril (ZESTRIL) 40 MG tablet Take 1 tablet (40 mg total) by mouth daily. 90 tablet 1  . metoprolol tartrate (LOPRESSOR) 25 MG tablet Take 1 tablet (25 mg total) by mouth 2 (two) times daily. 180 tablet 1  . Misc. Devices MISC by Does not apply route. CPAP    . Multiple Vitamin (MULTIVITAMIN) tablet Take 1 tablet by mouth daily.    . nitroGLYCERIN (NITROSTAT) 0.4 MG SL tablet Place 1 tablet (0.4 mg total) under the tongue every 5 (five) minutes as needed for chest pain. If pain does not resolve with rest. 50 tablet 3  . rosuvastatin (CRESTOR) 10 MG tablet Take 1 tablet (10 mg total) by mouth daily. 90 tablet 0  . thiamine (VITAMIN B-1) 100 MG tablet Take 100 mg by mouth every evening.     . vitamin E 400 UNIT capsule Take 400 Units by mouth daily.    . NP THYROID 90 MG tablet Take 1 tablet by mouth once daily 30 tablet 3   No current facility-administered medications for this visit.    REVIEW OF SYSTEMS:  [X]  denotes positive finding, [ ]  denotes negative finding Cardiac  Comments:  Chest pain or chest pressure: x   Shortness of breath upon exertion: x   Short of breath when lying flat:    Irregular heart rhythm:        Vascular    Pain in calf, thigh, or hip brought on by ambulation: x   Pain in feet at night that wakes you up from your sleep:     Blood clot in your  veins:    Leg swelling:         Pulmonary    Oxygen at home:    Productive cough:     Wheezing:         Neurologic    Sudden weakness in arms or legs:     Sudden  numbness in arms or legs:     Sudden onset of difficulty speaking or slurred speech:    Temporary loss of vision in one eye:     Problems with dizziness:         Gastrointestinal    Blood in stool:     Vomited blood:         Genitourinary    Burning when urinating:     Blood in urine:        Psychiatric    Major depression:         Hematologic    Bleeding problems:    Problems with blood clotting too easily:        Skin    Rashes or ulcers:        Constitutional    Fever or chills:     PHYSICAL EXAM:   Vitals:   04/18/20 1106 04/18/20 1109  BP: (!) 127/53 139/72  Pulse: 70   Resp: 20   Temp: 98.4 F (36.9 C)   SpO2: 95%   Weight: 164 lb (74.4 kg)   Height: 5\' 6"  (1.676 m)     GENERAL: The patient is a well-nourished male, in no acute distress. The vital signs are documented above. CARDIAC: There is a regular rate and rhythm.  VASCULAR: I do not detect carotid bruits. He has a palpable right radial pulse.  The left radial pulse is slightly diminished. He does have palpable femoral pulses although slightly diminished. I cannot palpate pedal pulses. He has no significant lower extremity swelling. PULMONARY: There is good air exchange bilaterally without wheezing or rales. ABDOMEN: Soft and non-tender with normal pitched bowel sounds.  MUSCULOSKELETAL: There are no major deformities or cyanosis. NEUROLOGIC: No focal weakness or paresthesias are detected. SKIN: There are no ulcers or rashes noted. PSYCHIATRIC: The patient has a normal affect.  DATA:    ARTERIAL DOPPLER STUDY: I have independently interpreted his arterial Doppler study today.  On the right side there is a monophasic dorsalis pedis and posterior tibial signal.  ABI is 23%.  Toe pressure is 0.  On the left side there is a  monophasic dorsalis pedis and posterior tibial signal.  ABI is 42%.  Toe pressures 28 mmHg.  CAROTID DUPLEX: I have independently interpreted his carotid duplex scan.  On the right side there is a less than 39% stenosis.  The right vertebral artery is patent with antegrade flow.  There is no significant disease in the subclavian artery.  On the left side there is a less than 39% stenosis.  The left vertebral artery has retrograde flow.  There is stenosis in the left subclavian artery.  William [name] [facility] [phone]

## 2020-04-19 ENCOUNTER — Other Ambulatory Visit: Payer: Self-pay

## 2020-04-19 DIAGNOSIS — I739 Peripheral vascular disease, unspecified: Secondary | ICD-10-CM

## 2020-04-25 NOTE — Progress Notes (Signed)
Cardiology Office Note  Date: 04/26/2020   ID: William Crimes., DOB [birth year], MRN [id number]  PCP:  [name], NP  Cardiologist:  No primary care provider on file. Electrophysiologist:  None   Chief Complaint: Cardiac follow up.  History of Present Illness: William [name]. is a 80 y.o. male with a history of HTN, HLD, COPD, iliac artery aneurysm, CAD/MI (S/P CABG LIMA-LAD, SVG-OM1 2017), PAD (right external iliac stenting), tobacco abuse, bilateral carotid artery stenosis.  Last visit via telemedicine with Dr Purvis Sheffield 04/27/2019. He denied any changes in symptoms since last visit in May 2020. Had no exertional angina. His chronic exertional dyspnea from COPD and tobacco use was stable. He was continuing ASA, metoprolol, ACEI, and Crestor. BP was elevated at home in the 150 systolic range. He stated it would normalize after a minute or so when re-checking BP. He was continuing Crestor. Mild to moderate AR evident on prior echo 2017. Repeat echo was ordered.   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   Patient is here for 1 year follow-up.  States he recently saw vascular and states vascular may need to perform surgery on him due to decreased blood flow to his right leg.  He continues with claudication.  He has had a previous stent in that leg for PAD.  Also complaining of recent chest pain when performing more than usual ADLs.  He states recently he was cleaning his bathtub which brought on chest pain.  States after stopping to rest the chest pain resolved within 5 minutes.  States he did not use nitroglycerin due to short-lived chest pain.  He states he is noticing these chest pains occurring when he does more than usual activities.  He states he has not taken any sublingual nitroglycerin because the chest pain did not last that long.  He has had previous two-vessel bypass.  Also continues to complain of dyspnea on  exertion but he has significant COPD per his statement.  He denies any CVA or TIA-like symptoms, palpitations or arrhythmias, orthostatic symptoms.  He states he has occasional double vision.  He recently had carotid studies which showed bilateral 1 to 39% ICA stenosis.  Recent ABIs showed critical limb ischemia and right leg.  Past Medical History:  Diagnosis Date  . Carotid artery occlusion   . Colon polyp   . COPD (chronic obstructive pulmonary disease) (HCC)   . Coronary artery disease   . Hyperlipidemia   . Hypertension   . Iliac artery aneurysm (HCC)   . Myocardial infarction (HCC)   . Peripheral arterial disease [facility])     Past Surgical History:  Procedure Laterality Date  . ABDOMINAL AORTOGRAM W/LOWER EXTREMITY N/A 08/21/2017   Procedure: ABDOMINAL AORTOGRAM W/LOWER EXTREMITY;  Surgeon: Chuck Hint, MD;  [facility] INVASIVE CV LAB;  Service: Cardiovascular;  Laterality: N/A;  . Bilateral renal  artery stenoses  04/23/2009  . CARDIAC CATHETERIZATION N/A 05/21/2015   Procedure: Left Heart Cath and Coronary Angiography;  Surgeon: Runell Gess, MD;  Location: [facility] INVASIVE CV LAB;  Service: Cardiovascular;  Laterality: N/A;  . CORONARY ARTERY BYPASS GRAFT N/A 05/22/2015   Procedure: CORONARY ARTERY BYPASS GRAFTING (CABG) LIMA-LAD and SVG-OM EVH RIGHT THIGH GREATER SAPHENOUS VEIN;  Surgeon: Delight Ovens, MD;  Location: [facility] OR;  Service: Open Heart Surgery;  Laterality: N/A;  . HIATAL HERNIA REPAIR    . Percutaneous translumnial angioplasty  04/23/2009  Catheterization of [place]efst external iliac artery with Left lower extremity runoff  . TEE WITHOUT CARDIOVERSION N/A 05/22/2015   Procedure: TRANSESOPHAGEAL ECHOCARDIOGRAM (TEE);  Surgeon: Delight OvensEdward B Gerhardt, MD;  [facility];  Service: Open Heart Surgery;  Laterality: N/A;    Current Outpatient Medications  Medication Sig Dispense Refill  . amLODipine (NORVASC) 10 MG tablet TAKE 1 TABLET BY MOUTH EVERY DAY 90 tablet 0   . Ascorbic Acid (VITAMIN C) 1000 MG tablet Take 1,000 mg by mouth daily.    Marland Kitchen. aspirin EC 81 MG tablet Take 1 tablet (81 mg total) by mouth daily.    . Cholecalciferol 1.25 MG (50000 UT) TABS Take 1 tablet by mouth daily.    . fish oil-omega-3 fatty acids 1000 MG capsule Take 1,000 g by mouth daily.     . folic acid (FOLVITE) 400 MCG tablet Take 400 mcg by mouth daily.    . Garlic 1250 MG TABS Take 1,250 mg by mouth daily.     Marland Kitchen. lisinopril (ZESTRIL) 40 MG tablet Take 1 tablet (40 mg total) by mouth daily. 90 tablet 1  . metoprolol tartrate (LOPRESSOR) 25 MG tablet Take 1 tablet (25 mg total) by mouth 2 (two) times daily. 180 tablet 1  . Misc. Devices MISC by Does not apply route. CPAP    . Multiple Vitamin (MULTIVITAMIN) tablet Take 1 tablet by mouth daily.    . nitroGLYCERIN (NITROSTAT) 0.4 MG SL tablet Place 1 tablet (0.4 mg total) under the tongue every 5 (five) minutes as needed for chest pain. If pain does not resolve with rest. 50 tablet 3  . rosuvastatin (CRESTOR) 10 MG tablet Take 1 tablet (10 mg total) by mouth daily. 90 tablet 0  . thiamine (VITAMIN B-1) 100 MG tablet Take 100 mg by mouth every evening.     . vitamin E 400 UNIT capsule Take 400 Units by mouth daily.    . NP THYROID 90 MG tablet Take 1 tablet by mouth once daily 30 tablet 3   No current facility-administered medications for this visit.   Allergies:  Patient has no known allergies.   Social History: The patient  reports that he quit smoking about 28 years ago. His smoking use included cigarettes. He started smoking about 58 years ago. He has a 60.00 pack-year smoking history. He has quit using smokeless tobacco.  His smokeless tobacco use included snuff. He reports previous alcohol use of about 2.0 - 4.0 standard drinks of alcohol per week. He reports that he does not use drugs.   Family History: The patient's family history includes Cancer in his mother; Deep vein thrombosis in his father; Heart disease in his father;  Pulmonary embolism in his father.   ROS:  Please see the history of present illness. Otherwise, complete review of systems is positive for none.  All other systems are reviewed and negative.   Physical Exam: VS:  BP (!) 164/82   Pulse 86   Ht 5\' 7"  (1.702 m)   Wt 164 lb (74.4 kg)   SpO2 98%   BMI 25.69 kg/m , BMI Body mass index is 25.69 kg/m.  Wt Readings from Last 3 Encounters:  04/26/20 164 lb (74.4 kg)  04/18/20 164 lb (74.4 kg)  04/03/20 161 lb (73 kg)    General: Patient appears comfortable at rest. Neck: Supple, no elevated JVP or carotid bruits, no thyromegaly. Lungs: Clear to auscultation, nonlabored breathing at rest. Cardiac: Regular rate and rhythm, no S3 or significant systolic murmur,  no pericardial rub. Extremities: No pitting edema, distal pulses 2 in left lower extremity less than 1+ right lower extremity. Skin: Warm and dry. Musculoskeletal: No kyphosis. Neuropsychiatric: Alert and oriented x3, affect grossly appropriate.  ECG:  An ECG dated 04/26/2020 was personally reviewed today and demonstrated:  Normal sinus rhythm rate of 80.  Possible left atrial enlargement, nonspecific ST and T wave abnormality.  Recent Labwork: 07/18/2019: ALT 21; AST 19; BUN 30; Creat 1.16; Potassium 5.1; Sodium 140     Component Value Date/Time   CHOL 127 07/18/2019 0912   TRIG 74 07/18/2019 0912   HDL 41 07/18/2019 0912   CHOLHDL 3.1 07/18/2019 0912   VLDL 15 05/19/2015 0224   LDLCALC 71 07/18/2019 0912    Other Studies Reviewed Today:   Lower arterial duplex / ABI 04/18/2020 Bilateral ABIs appear decreased compared to prior study on 03/15/2019. Summary: Right: Resting right ankle-brachial index indicates critical limb ischemia. The right toe PPG waveform is absent. Left: Resting left ankle-brachial index indicates severe left lower extremity arterial disease. The left toebrachial index is abnormal.    Carotid artery duplex 04/18/2020 Summary: Right Carotid:  Velocities in the right ICA are consistent with a 1-39% stenosis. The ECA appears >50% stenosed. Left Carotid: Velocities in the left ICA are consistent with a 1-39% stenosis. The ECA appears >50% stenosed. Vertebrals: Right vertebral artery demonstrates antegrade flow. Left vertebral artery demonstrates retrograde flow. Subclavians: Left subclavian artery was stenotic. Normal flow hemodynamics were seen in the right subclavian artery.   Echocardiogram 06/22/2019 1. Left ventricular ejection fraction, by estimation, is 65 to 70%. The left ventricle has normal function. The left ventricle has no regional wall motion abnormalities. There is mild left ventricular hypertrophy. Left ventricular diastolic parameters are indeterminate. 2. Right ventricular systolic function is normal. The right ventricular size is normal. There is normal pulmonary artery systolic pressure. The estimated right ventricular systolic pressure is 83.1 mmHg. 3. Left atrial size was upper normal. 4. The mitral valve is grossly normal. Trivial mitral valve regurgitation. 5. The aortic valve is tricuspid. Aortic valve regurgitation is mild. Mild to moderate aortic valve sclerosis/calcification is present, without any evidence of aortic stenosis. 6. The inferior vena cava is normal in size with greater than 50% respiratory variability, suggesting right atrial pressure of 3 mmHg.  Assessment and Plan:  1. CAD in native artery   2. Peripheral vascular disease, unspecified ([place])   3. Essential hypertension   4. Mixed hyperlipidemia    1. CAD in native artery Recent complaints of chest pain when performing more than usual activities.  States yesterday he was cleaning his tub which brought on the chest pain.  When he stopped and rested and the chest pain resolved.  States he did not take any nitroglycerin due to the short duration of chest pain.  States this has been occurring with more frequency recently.  Please start  Imdur 30 mg daily.  Start with half a pill daily.  Please get a Lexiscan stress test.  Continue aspirin 81 mg daily, nitroglycerin sublingual as needed  2. Peripheral vascular disease, unspecified ([place]) Recent lower extremity arterial studies showing critical limb ischemia in right leg.  He sees Dr. Scot Dock vascular surgeon.  Has significant mobility limitations due to claudication issues.  Patient states he already has a stent in this right leg.  States he is unsure as to whether he will have surgery on this extremity soon or not.  3. Essential hypertension Blood pressure is elevated today at  164/82.  Patient states blood pressure at home is usually 130s over 60s.  States he does not like coming to doctors.  Continue lisinopril 40 mg daily.  Continue metoprolol 25 mg p.o. twice daily.  4. Mixed hyperlipidemia Continue Crestor 10 mg daily.  5. Chest pain, unspecified type As mentioned above in #1 having more frequent episodes of chest pain when performing more than usual ADLs.  We are starting Imdur 15 mg daily and ordering a Lexiscan stress test.  Continuing aspirin 81 mg daily, sublingual nitroglycerin as needed and Imdur.  Medication Adjustments/Labs and Tests Ordered: Current medicines are reviewed at length with the patient today.  Concerns regarding medicines are outlined above.   Disposition: Follow-up with Domenic Polite or APP 4 weeks  Signed, Levell July, NP 04/26/2020 10:42 AM    [facility] [address], [place], [place] [address] Phone: [phone]; Fax: [phone]

## 2020-04-26 ENCOUNTER — Encounter: Payer: Self-pay | Admitting: *Deleted

## 2020-04-26 ENCOUNTER — Ambulatory Visit (INDEPENDENT_AMBULATORY_CARE_PROVIDER_SITE_OTHER): Payer: Medicare Other | Admitting: Family Medicine

## 2020-04-26 ENCOUNTER — Other Ambulatory Visit: Payer: Self-pay

## 2020-04-26 ENCOUNTER — Encounter: Payer: Self-pay | Admitting: Family Medicine

## 2020-04-26 VITALS — BP 164/82 | HR 86 | Ht 67.0 in | Wt 164.0 lb

## 2020-04-26 DIAGNOSIS — E782 Mixed hyperlipidemia: Secondary | ICD-10-CM | POA: Diagnosis not present

## 2020-04-26 DIAGNOSIS — I2510 Atherosclerotic heart disease of native coronary artery without angina pectoris: Secondary | ICD-10-CM

## 2020-04-26 DIAGNOSIS — I739 Peripheral vascular disease, unspecified: Secondary | ICD-10-CM | POA: Diagnosis not present

## 2020-04-26 DIAGNOSIS — I10 Essential (primary) hypertension: Secondary | ICD-10-CM

## 2020-04-26 DIAGNOSIS — R079 Chest pain, unspecified: Secondary | ICD-10-CM

## 2020-04-26 MED ORDER — ISOSORBIDE MONONITRATE ER 30 MG PO TB24: 15.0000 mg | ORAL_TABLET | Freq: Every day | ORAL | 1 refills | Status: DC

## 2020-04-26 NOTE — Patient Instructions (Signed)
Your physician recommends that you schedule a follow-up appointment in: Starr School, NP  Your physician has recommended you make the following change in your medication:   START IMDUR 15 MG DAILY   Your physician has requested that you have a lexiscan myoview. For further information please visit [email or web address]. Please follow instruction sheet, as given.  Thank you for choosing [facility]!!

## 2020-05-04 ENCOUNTER — Telehealth: Payer: Self-pay | Admitting: Family Medicine

## 2020-05-04 NOTE — Telephone Encounter (Signed)
Patient called requesting to speak with someone regarding his medications.  °

## 2020-05-04 NOTE — Telephone Encounter (Signed)
Pt wanted to make sure he didn't need to hold any medication prior to 2/7 stress test - aware that he didn't need to hold any meds prior

## 2020-05-07 ENCOUNTER — Encounter (HOSPITAL_COMMUNITY)
Admission: RE | Admit: 2020-05-07 | Discharge: 2020-05-07 | Disposition: A | Discharge status: Home / Self Care | Payer: Medicare Other | Source: Ambulatory Visit | Attending: Family Medicine | Admitting: Family Medicine

## 2020-05-07 ENCOUNTER — Other Ambulatory Visit: Payer: Self-pay

## 2020-05-07 ENCOUNTER — Ambulatory Visit (HOSPITAL_COMMUNITY)
Admission: RE | Admit: 2020-05-07 | Discharge: 2020-05-07 | Disposition: A | Discharge status: Home / Self Care | Payer: Medicare Other | Source: Ambulatory Visit | Attending: Family Medicine | Admitting: Family Medicine

## 2020-05-07 DIAGNOSIS — R079 Chest pain, unspecified: Secondary | ICD-10-CM

## 2020-05-07 LAB — NM MYOCAR MULTI W/SPECT W/WALL MOTION / EF
LV dias vol: 61 mL (ref 62–150)
LV sys vol: 27 mL
Peak HR: 121 {beats}/min
RATE: 0.4
Rest HR: 100 {beats}/min
SDS: 5
SRS: 4
SSS: 9
TID: 1.31

## 2020-05-07 MED ORDER — REGADENOSON 0.4 MG/5ML IV SOLN
INTRAVENOUS | Status: AC
  Administered 2020-05-07: 0.4 mg via INTRAVENOUS
  Filled 2020-05-07: qty 5

## 2020-05-07 MED ORDER — TECHNETIUM TC 99M TETROFOSMIN IV KIT
10.0000 | PACK | Freq: Once | INTRAVENOUS | Status: AC | PRN
  Administered 2020-05-07: 10.8 via INTRAVENOUS

## 2020-05-07 MED ORDER — TECHNETIUM TC 99M TETROFOSMIN IV KIT
30.0000 | PACK | Freq: Once | INTRAVENOUS | Status: AC | PRN
  Administered 2020-05-07: 32 via INTRAVENOUS

## 2020-05-07 MED ORDER — SODIUM CHLORIDE FLUSH 0.9 % IV SOLN
INTRAVENOUS | Status: AC
  Administered 2020-05-07: 10 mL via INTRAVENOUS
  Filled 2020-05-07: qty 10

## 2020-05-08 ENCOUNTER — Other Ambulatory Visit (INDEPENDENT_AMBULATORY_CARE_PROVIDER_SITE_OTHER): Payer: Self-pay | Admitting: Internal Medicine

## 2020-05-11 ENCOUNTER — Other Ambulatory Visit: Payer: Self-pay | Admitting: Family Medicine

## 2020-05-11 NOTE — Progress Notes (Signed)
Tell him to increase to 60 mg daily in see if the might help. Thanks

## 2020-05-21 ENCOUNTER — Telehealth: Payer: Self-pay | Admitting: *Deleted

## 2020-05-21 NOTE — Telephone Encounter (Signed)
Laurine Blazer, LPN  1/42/3953 2:02 PM EST Back to Top     No answer.   Verta Ellen., NP  05/17/2020 1:19 PM EST      Can increase Imdur to 60 mg daily. It may help   Laurine Blazer, LPN  3/34/3568 6:16 PM EST      Patient notified and verbalized understanding. He sates that is already doing the 30mg  of Imdur. Was initially started out at 15mg , but was told to increase to 30mg  daily at his stress test on 05/07/2020. States that he does feel like this has helped some. Has f/u in office on 05/28/2020. Do you want him to increase further or continue same till follow up visit?   Verta Ellen., NP  05/08/2020 7:58 AM EST      Patient has an intermediate risk stress test. However he has significant coronary artery disease with history of coronary artery bypass. Please start Imdur 30 mg daily to see if that will help with his anginal symptoms. Thank you

## 2020-05-22 ENCOUNTER — Other Ambulatory Visit (INDEPENDENT_AMBULATORY_CARE_PROVIDER_SITE_OTHER): Payer: Self-pay | Admitting: Internal Medicine

## 2020-05-24 NOTE — Telephone Encounter (Signed)
Laurine Blazer, LPN  9/43/2761 4:70 PM EST Back to Top     In the middle of notifying patient - call dropped. Can notify at his follow up on Monday, 05/28/2020 with Katina Dung, NP.

## 2020-05-27 NOTE — Progress Notes (Signed)
Cardiology Office Note  Date: 05/28/2020   ID: William Bachelor., DOB [birth year], MRN [id number]  PCP:  Ailene Ards, NP  Cardiologist:  No primary care provider on file. Electrophysiologist:  None   Chief Complaint: Cardiac follow up.  History of Present Illness: William Joye. is a 80 y.o. male with a history of HTN, HLD, COPD, iliac artery aneurysm, CAD/MI (S/P CABG LIMA-LAD, SVG-OM1 2017), PAD (right external iliac stenting), tobacco abuse, bilateral carotid artery stenosis.  Last visit via telemedicine with Dr Bronson Ing 04/27/2019. He denied any changes in symptoms since last visit in May 2020. Had no exertional angina. His chronic exertional dyspnea from COPD and tobacco use was stable. He was continuing ASA, metoprolol, ACEI, and Crestor. BP was elevated at home in the 092 systolic range. He stated it would normalize after a minute or so when re-checking BP. He was continuing Crestor. Mild to moderate AR evident on prior echo 2017. Repeat echo was ordered.   Recent vascular studies ordered by Dr Scot Dock demonstrated evidence of CLI in right LE on 04/18/2020.   last here for 1 year follow-up.  States he recently saw vascular and states vascular may need to perform surgery on him due to decreased blood flow to his right leg.  He continues with claudication. Previous stent in that leg for PAD.  C/o  of recent chest pain when performing more than usual ADLs. Recently he was cleaning  bathtub inducing chest pain.  States after stopping to rest the chest pain resolved within 5 minutes. Did not use nitroglycerin due to short-lived chest pain.  He was noticing chest pains occurring when performing more  than usual activities. Previous two-vessel bypass.  Continued dyspnea on exertion but significant COPD.  Having  occasional double vision.  Recent carotid studies bilateral 1 to 39% ICA stenosis.  Recent ABIs demonstrate critical limb ischemia  right leg.  Follows with vascular  He is  here today stating his chest pain has improved since starting Imdur.  He denies any significant anginal or exertional symptoms but does have COPD.  Discussed recent stress test.  With patient.  Discussed the fact there was an intermediate risk with mildly elevated TID of 1.31 which may suggest a component of balanced ischemia and high risk.  EF was 55 to 65%.  Findings consistent with prior anteroseptal septal myocardial infarction with moderate peri-infarct ischemia.  He states he is likely going to have surgery on his right leg and possibly his left leg due to significant PAD.  He is seeing Dr. Doren Custard.  Continues to have moderate claudication-like symptoms.  Past Medical History:  Diagnosis Date  . Carotid artery occlusion   . Colon polyp   . COPD (chronic obstructive pulmonary disease) ([place])   . Coronary artery disease   . Hyperlipidemia   . Hypertension   . Iliac artery aneurysm ([place])   . Myocardial infarction ([place])   . Peripheral arterial disease [facility] [address])     Past Surgical History:  Procedure Laterality Date  . ABDOMINAL AORTOGRAM W/LOWER EXTREMITY N/A 08/21/2017   Procedure: ABDOMINAL AORTOGRAM W/LOWER EXTREMITY;  Surgeon: Angelia Mould, MD;  Location: [facility];  Service: Cardiovascular;  Laterality: N/A;  . Bilateral renal  artery stenoses  04/23/2009  . CARDIAC CATHETERIZATION N/A 05/21/2015   Procedure: Left Heart Cath and Coronary Angiography;  Surgeon: Lorretta Harp, MD;  Location: [facility];  Service: Cardiovascular;  Laterality: N/A;  . CORONARY ARTERY BYPASS  GRAFT N/A 05/22/2015   Procedure: CORONARY ARTERY BYPASS GRAFTING (CABG) LIMA-LAD and SVG-OM EVH RIGHT THIGH GREATER SAPHENOUS VEIN;  Surgeon: [name], MD;  Location: [facility];  Service: Open Heart Surgery;  Laterality: N/A;  . HIATAL HERNIA REPAIR    . Percutaneous translumnial angioplasty  04/23/2009   Catheterization of [place] external iliac artery with Left lower extremity runoff  . TEE  WITHOUT CARDIOVERSION N/A 05/22/2015   Procedure: TRANSESOPHAGEAL ECHOCARDIOGRAM (TEE);  Surgeon: [name], MD;  Location: [facility];  Service: Open Heart Surgery;  Laterality: N/A;    Current Outpatient Medications  Medication Sig Dispense Refill  . amLODipine (NORVASC) 10 MG tablet TAKE 1 TABLET BY MOUTH EVERY DAY 90 tablet 0  . Ascorbic Acid (VITAMIN C) 1000 MG tablet Take 1,000 mg by mouth daily.    Marland Kitchen aspirin EC 81 MG tablet Take 1 tablet (81 mg total) by mouth daily.    . Cholecalciferol 1.25 MG (50000 UT) TABS Take 1 tablet by mouth daily.    . fish oil-omega-3 fatty acids 1000 MG capsule Take 1,000 g by mouth daily.     . folic acid (FOLVITE) 431 MCG tablet Take 400 mcg by mouth daily.    . Garlic 5400 MG TABS Take 1,250 mg by mouth daily.     . isosorbide mononitrate (IMDUR) 30 MG 24 hr tablet Take 30 mg by mouth daily.    Marland Kitchen lisinopril (ZESTRIL) 40 MG tablet Take 1 tablet (40 mg total) by mouth daily. 90 tablet 1  . metoprolol tartrate (LOPRESSOR) 25 MG tablet Take 1 tablet (25 mg total) by mouth 2 (two) times daily. 180 tablet 1  . Misc. Devices MISC by Does not apply route. CPAP    . Multiple Vitamin (MULTIVITAMIN) tablet Take 1 tablet by mouth daily.    . nitroGLYCERIN (NITROSTAT) 0.4 MG SL tablet Place 1 tablet (0.4 mg total) under the tongue every 5 (five) minutes as needed for chest pain. If pain does not resolve with rest. 50 tablet 3  . NP THYROID 90 MG tablet Take 1 tablet by mouth once daily 30 tablet 3  . rosuvastatin (CRESTOR) 10 MG tablet TAKE 1 TABLET BY MOUTH EVERY DAY NEEDS TO BE SEEN 90 tablet 1  . thiamine (VITAMIN B-1) 100 MG tablet Take 100 mg by mouth every evening.     . vitamin E 400 UNIT capsule Take 400 Units by mouth daily.     No current facility-administered medications for this visit.   Allergies:  Patient has no known allergies.   Social History: The patient  reports that he quit smoking about 28 years ago. His smoking use included cigarettes. He  started smoking about 59 years ago. He has a 60.00 pack-year smoking history. He has quit using smokeless tobacco.  His smokeless tobacco use included snuff. He reports previous alcohol use of about 2.0 - 4.0 standard drinks of alcohol per week. He reports that he does not use drugs.   Family History: The patient's family history includes Cancer in his mother; Deep vein thrombosis in his father; Heart disease in his father; Pulmonary embolism in his father.   ROS:  Please see the history of present illness. Otherwise, complete review of systems is positive for none.  All other systems are reviewed and negative.   Physical Exam: VS:  BP 138/64   Pulse 62   Ht 5\' 7"  (1.702 m)   Wt 162 lb 9.6 oz (73.8 kg)   SpO2 95%  BMI 25.47 kg/m , BMI Body mass index is 25.47 kg/m.  Wt Readings from Last 3 Encounters:  05/28/20 162 lb 9.6 oz (73.8 kg)  04/26/20 164 lb (74.4 kg)  04/18/20 164 lb (74.4 kg)    General: Patient appears comfortable at rest. Neck: Supple, no elevated JVP or carotid bruits, no thyromegaly. Lungs: Clear to auscultation, nonlabored breathing at rest. Cardiac: Regular rate and rhythm, no S3 or significant systolic murmur, no pericardial rub. Extremities: No pitting edema, distal pulses 2 in left lower extremity less than 1+ right lower extremity. Skin: Warm and dry. Musculoskeletal: No kyphosis. Neuropsychiatric: Alert and oriented x3, affect grossly appropriate.  ECG:  An ECG dated 04/26/2020 was personally reviewed today and demonstrated:  Normal sinus rhythm rate of 80.  Possible left atrial enlargement, nonspecific ST and T wave abnormality.  Recent Labwork: 07/18/2019: ALT 21; AST 19; BUN 30; Creat 1.16; Potassium 5.1; Sodium 140     Component Value Date/Time   CHOL 127 07/18/2019 0912   TRIG 74 07/18/2019 0912   HDL 41 07/18/2019 0912   CHOLHDL 3.1 07/18/2019 0912   VLDL 15 05/19/2015 0224   LDLCALC 71 07/18/2019 0912    Other Studies Reviewed Today:  NST  05/07/2020 Study Result  Narrative & Impression   There was no ST segment deviation noted during stress.  Findings consistent with prior anteroseptal/septal myocardial infarction with moderate peri-infarct ischemia.  This is an intermediate risk study. Mildly elevated TID of 1.31 may suggest a component of balanced ischemia and higher risk.  The left ventricular ejection fraction is normal (55-65%).       Lower arterial duplex / ABI 04/18/2020 Bilateral ABIs appear decreased compared to prior study on 03/15/2019. Summary: Right: Resting right ankle-brachial index indicates critical limb ischemia. The right toe PPG waveform is absent. Left: Resting left ankle-brachial index indicates severe left lower extremity arterial disease. The left toebrachial index is abnormal.    Carotid artery duplex 04/18/2020 Summary: Right Carotid: Velocities in the right ICA are consistent with a 1-39% stenosis. The ECA appears >50% stenosed. Left Carotid: Velocities in the left ICA are consistent with a 1-39% stenosis. The ECA appears >50% stenosed. Vertebrals: Right vertebral artery demonstrates antegrade flow. Left vertebral artery demonstrates retrograde flow. Subclavians: Left subclavian artery was stenotic. Normal flow hemodynamics were seen in the right subclavian artery.   Echocardiogram 06/22/2019 1. Left ventricular ejection fraction, by estimation, is 65 to 70%. The left ventricle has normal function. The left ventricle has no regional wall motion abnormalities. There is mild left ventricular hypertrophy. Left ventricular diastolic parameters are indeterminate. 2. Right ventricular systolic function is normal. The right ventricular size is normal. There is normal pulmonary artery systolic pressure. The estimated right ventricular systolic pressure is 86.5 mmHg. 3. Left atrial size was upper normal. 4. The mitral valve is grossly normal. Trivial mitral valve regurgitation. 5. The aortic  valve is tricuspid. Aortic valve regurgitation is mild. Mild to moderate aortic valve sclerosis/calcification is present, without any evidence of aortic stenosis. 6. The inferior vena cava is normal in size with greater than 50% respiratory variability, suggesting right atrial pressure of 3 mmHg.   Cardiac catheterization 05/21/2015 History obtained from chart review. William [name] is a 80 y/o Caucasian male admitted on 2/17 with  Chest pain. He was a non-STEMI with an level of 25. He presents  Now for cardiac catheterization.    IMPRESSION:William [name] has a 99% left main and 95% tandem LAD stenosis. He will need CABG. The  sheath was removed and a TR band was placed on the right wrist to achieve patent hemostasis. The pt left the lab in stable condition. The patient will be started back on IV heparin.  William [name]. MD, [facility]  Diagnostic Dominance: Right       Brief operative report 05/22/2015 Dr. Pia Mau PROCEDURE:  Procedure(s): CORONARY ARTERY BYPASS GRAFTING (CABG) (N/A) TRANSESOPHAGEAL ECHOCARDIOGRAM (TEE) (N/A) LIMA-LAD; SVG-OM EVH RIGHT THIGH GREATER SAPHENOUS VEIN   Assessment and Plan:   1. CAD in native artery Last visit had complaints of chest pain when performing more than usual activities.  We started Imdur 30 mg daily.  And ordered as Lexiscan stress test.  See results above.  Continue Imdur 30 mg p.o. daily.  Continue aspirin 81 mg daily, nitroglycerin sublingual as needed.  Patient states chest pain has improved.  States she has had no chest pain since the last visit.  2. Peripheral vascular disease, unspecified ([place]) Recent lower extremity arterial studies  ischemia in right leg. He sees Dr. Scot Dock vascular surgeon.  Has significant mobility limitations due to claudication issues.  Patient states he already has a stent in this right leg.  States he is unsure as to whether he will have surgery on this extremity soon or not.  3. Essential hypertension Blood  pressure is elevated today at 138/64 patient.  States blood pressure at home is usually 130s over 60s. Continue lisinopril 40 mg daily.  Continue metoprolol 25 mg p.o. twice daily.  4. Mixed hyperlipidemia Continue Crestor 10 mg daily.  5. Chest pain, unspecified type As mentioned above in #1 at last visit he was having more frequent episodes of chest pain when performing more than usual ADLs.  Discussed recent stress test result with patient.  He is currently taking Imdur 30 mg with improvement of symptoms.  Continuing aspirin 81 mg daily, sublingual nitroglycerin as needed and Imdur.  Medication Adjustments/Labs and Tests Ordered: Current medicines are reviewed at length with the patient today.  Concerns regarding medicines are outlined above.   Disposition: Follow-up with Dr. Harl Bowie or APP 6 months  Signed, Levell July, NP 05/28/2020 9:49 AM    [facility] [address], [place], [place] [address] Phone: [phone]; Fax: [phone]

## 2020-05-28 ENCOUNTER — Encounter: Payer: Self-pay | Admitting: Family Medicine

## 2020-05-28 ENCOUNTER — Ambulatory Visit (INDEPENDENT_AMBULATORY_CARE_PROVIDER_SITE_OTHER): Payer: Medicare Other | Admitting: Family Medicine

## 2020-05-28 VITALS — BP 138/64 | HR 62 | Ht 67.0 in | Wt 162.6 lb

## 2020-05-28 DIAGNOSIS — R079 Chest pain, unspecified: Secondary | ICD-10-CM | POA: Diagnosis not present

## 2020-05-28 DIAGNOSIS — E782 Mixed hyperlipidemia: Secondary | ICD-10-CM | POA: Diagnosis not present

## 2020-05-28 DIAGNOSIS — I739 Peripheral vascular disease, unspecified: Secondary | ICD-10-CM

## 2020-05-28 DIAGNOSIS — I10 Essential (primary) hypertension: Secondary | ICD-10-CM

## 2020-05-28 DIAGNOSIS — I2510 Atherosclerotic heart disease of native coronary artery without angina pectoris: Secondary | ICD-10-CM | POA: Diagnosis not present

## 2020-05-28 NOTE — Patient Instructions (Signed)
Medication Instructions:  Continue all current medications.   Labwork: none  Testing/Procedures: none  Follow-Up: 6 months   Any Other Special Instructions Will Be Listed Below (If Applicable).   If you need a refill on your cardiac medications before your next appointment, please call your pharmacy.  

## 2020-06-13 ENCOUNTER — Telehealth: Payer: Self-pay | Admitting: Family Medicine

## 2020-06-13 ENCOUNTER — Other Ambulatory Visit: Payer: Self-pay | Admitting: Family Medicine

## 2020-06-13 MED ORDER — ISOSORBIDE MONONITRATE ER 30 MG PO TB24: 30.0000 mg | ORAL_TABLET | Freq: Every day | ORAL | 3 refills | Status: DC

## 2020-06-13 MED ORDER — ROSUVASTATIN CALCIUM 10 MG PO TABS: ORAL_TABLET | ORAL | 3 refills | Status: DC

## 2020-06-13 MED ORDER — AMLODIPINE BESYLATE 10 MG PO TABS: 10.0000 mg | ORAL_TABLET | Freq: Every day | ORAL | 3 refills | Status: DC

## 2020-06-13 NOTE — Telephone Encounter (Signed)
Advised that refills sent for imdur, norvasc and crestor and that lisinopril and metoprolol were sent earlier this morning. Verbalized understanding.

## 2020-06-13 NOTE — Telephone Encounter (Signed)
Patient called in regards to RX isosorbide. States that the pharmacy [facility] told him that he would have to contact our office before they can fill this RX.

## 2020-07-04 ENCOUNTER — Encounter (INDEPENDENT_AMBULATORY_CARE_PROVIDER_SITE_OTHER): Payer: Self-pay | Admitting: Nurse Practitioner

## 2020-07-04 ENCOUNTER — Other Ambulatory Visit: Payer: Self-pay

## 2020-07-04 ENCOUNTER — Ambulatory Visit (INDEPENDENT_AMBULATORY_CARE_PROVIDER_SITE_OTHER): Payer: Medicare Other | Admitting: Nurse Practitioner

## 2020-07-04 VITALS — BP 146/64 | HR 73 | Temp 97.9°F | Ht 67.0 in | Wt 165.2 lb

## 2020-07-04 DIAGNOSIS — Z1159 Encounter for screening for other viral diseases: Secondary | ICD-10-CM | POA: Diagnosis not present

## 2020-07-04 DIAGNOSIS — R5383 Other fatigue: Secondary | ICD-10-CM

## 2020-07-04 DIAGNOSIS — I10 Essential (primary) hypertension: Secondary | ICD-10-CM | POA: Diagnosis not present

## 2020-07-04 DIAGNOSIS — J449 Chronic obstructive pulmonary disease, unspecified: Secondary | ICD-10-CM | POA: Diagnosis not present

## 2020-07-04 DIAGNOSIS — E559 Vitamin D deficiency, unspecified: Secondary | ICD-10-CM | POA: Diagnosis not present

## 2020-07-04 DIAGNOSIS — E782 Mixed hyperlipidemia: Secondary | ICD-10-CM | POA: Diagnosis not present

## 2020-07-04 DIAGNOSIS — I25118 Atherosclerotic heart disease of native coronary artery with other forms of angina pectoris: Secondary | ICD-10-CM

## 2020-07-04 MED ORDER — ISOSORBIDE MONONITRATE ER 60 MG PO TB24: 60.0000 mg | ORAL_TABLET | Freq: Every day | ORAL | 0 refills | Status: DC

## 2020-07-04 MED ORDER — ISOSORBIDE MONONITRATE ER 30 MG PO TB24: 30.0000 mg | ORAL_TABLET | Freq: Every day | ORAL | Status: DC

## 2020-07-04 MED ORDER — SPIRIVA HANDIHALER 18 MCG IN CAPS: 18.0000 ug | ORAL_CAPSULE | Freq: Every day | RESPIRATORY_TRACT | 3 refills | Status: DC

## 2020-07-04 NOTE — Progress Notes (Signed)
Subjective:  Patient ID: William [name]., male    DOB: 1940/09/28  Age: 80 y.o. MRN: [id number]  CC:  Chief Complaint  Patient presents with  . Follow-up    Been to cardiologist a lot  . Hypertension  . Hyperlipidemia  . Coronary Artery Disease  . Fatigue  . COPD  . Other    Vitamin D deficiency, hepatitis C screening      HPI  This patient arrives today for the above.  Hypertension: He continues on amlodipine 10 mg daily, Imdur (per chart review his dose is listed as 30 mg, but the patient believes he is taking 60 mg.  He reports taking 1 full tablet by mouth daily), lisinopril, and metoprolol.  He tells me is tolerating his medications well.  Hyperlipidemia/Coronary artery disease: Last lipid panel was collected almost 1 year ago.  LDL at that time is 71.  He continues on aspirin, rosuvastatin, and omega-3 fish oil.  He follows with cardiology and tells me that he recently had a stress test which showed intermediate risk.  He was told to increase his Imdur and since making this increase he has noticed an improvement in chest pain.  He still experiences quite a bit of fatigue.  As stated above he is not sure which dose of Imdur he is on, he knows it is 1 full tablet by mouth daily.  In the system his dose is listed as a 30 mg tablet.  Fatigue: He continues to have quite a bit of fatigue.  He is on desiccated thyroid for the off label treatment of this.  He tells me he is able to perform an activity for about 5 to 10 minutes, and then will have to stop and take a rest due to the fatigue.  He does also have a history of COPD and is not currently on any medication or maintenance therapy for this.  He also has peripheral arterial disease and does have intermittent claudication in his legs.  He does follow with vascular surgeon and tells me he believes that he will have stents placed in both of his legs in the near future for the treatment of this.  COPD: As stated above he does  have COPD and used to be on Spiriva but tells me he is no longer taking this medication due to cost.  He does not feel that his breathing is worsened at all, but as stated above he does experience quite a bit of fatigue.  He denies any exacerbation within the last year.  Vitamin D deficiency: He continues on vitamin D3 supplement.  Last serum check showed a level of 57.  Hepatitis C screening: He has not had hepatitis C screening completed.  Past Medical History:  Diagnosis Date  . Carotid artery occlusion   . Colon polyp   . COPD (chronic obstructive pulmonary disease) ([place])   . Coronary artery disease   . Hyperlipidemia   . Hypertension   . Iliac artery aneurysm ([place])   . Myocardial infarction ([place])   . Peripheral arterial disease ([facility])       Family History  Problem Relation Age of Onset  . Heart disease Father        Heart Disease before age 26  . Pulmonary embolism Father   . Deep vein thrombosis Father   . Cancer Mother        Sarcoma    Social History   Social History Narrative   Retired used  to work in [employer]. Widower. Lives alone.   Social History   Tobacco Use  . Smoking status: Former Smoker    Packs/day: 2.00    Years: 30.00    Pack years: 60.00    Types: Cigarettes    Start date: 06/03/1961    Quit date: 11/17/1991    Years since quitting: 28.6  . Smokeless tobacco: Former Systems developer    Types: Snuff  Substance Use Topics  . Alcohol use: Not Currently    Alcohol/week: 2.0 - 4.0 standard drinks    Types: 2 - 4 Standard drinks or equivalent per week    Comment: used to      Current Meds  Medication Sig  . amLODipine (NORVASC) 10 MG tablet Take 1 tablet (10 mg total) by mouth daily.  . Ascorbic Acid (VITAMIN C) 1000 MG tablet Take 1,000 mg by mouth daily.  Marland Kitchen aspirin EC 81 MG tablet Take 1 tablet (81 mg total) by mouth daily.  . Cholecalciferol 1.25 MG (50000 UT) TABS Take 1 tablet by mouth daily.  . fish oil-omega-3 fatty acids 1000 MG capsule Take  1,000 g by mouth daily.   . folic acid (FOLVITE) 035 MCG tablet Take 400 mcg by mouth daily.  . Garlic 4656 MG TABS Take 1,250 mg by mouth daily.   Marland Kitchen lisinopril (ZESTRIL) 40 MG tablet TAKE 1 TABLET BY MOUTH EVERY DAY  . metoprolol tartrate (LOPRESSOR) 25 MG tablet TAKE 1 TABLET BY MOUTH TWICE A DAY  . Misc. Devices MISC by Does not apply route. CPAP  . Multiple Vitamin (MULTIVITAMIN) tablet Take 1 tablet by mouth daily.  . nitroGLYCERIN (NITROSTAT) 0.4 MG SL tablet Place 1 tablet (0.4 mg total) under the tongue every 5 (five) minutes as needed for chest pain. If pain does not resolve with rest.  . rosuvastatin (CRESTOR) 10 MG tablet TAKE 1 TABLET BY MOUTH EVERY DAY N  . thiamine (VITAMIN B-1) 100 MG tablet Take 100 mg by mouth every evening.   . tiotropium (SPIRIVA HANDIHALER) 18 MCG inhalation capsule Place 1 capsule (18 mcg total) into inhaler and inhale daily.  . vitamin E 400 UNIT capsule Take 400 Units by mouth daily.  . [DISCONTINUED] isosorbide mononitrate (IMDUR) 30 MG 24 hr tablet Take 1 tablet (30 mg total) by mouth daily.    ROS:  Review of Systems  Constitutional: Positive for malaise/fatigue.  Respiratory: Negative for shortness of breath.   Cardiovascular: Negative for chest pain.  Neurological: Negative for dizziness and headaches.     Objective:   Today's Vitals: BP (!) 146/64   Pulse 73   Temp 97.9 F (36.6 C) (Temporal)   Ht _0  (1.702 m)   Wt 165 lb 3.2 oz (74.9 kg)   SpO2 95%   BMI 25.87 kg/m  Vitals with BMI 07/04/2020 05/28/2020 04/26/2020  Height _1  _2  _3   Weight 165 lbs 3 oz 162 lbs 10 oz 164 lbs  BMI 25.87 81.27 51.70  Systolic 017 494 496  Diastolic 64 64 82  Pulse 73 62 86     Physical Exam Vitals reviewed.  Constitutional:      Appearance: Normal appearance.  HENT:     Head: Normocephalic and atraumatic.  Neck:     Vascular: Carotid bruit (left side) present.  Cardiovascular:     Rate and Rhythm: Normal rate and regular rhythm.   Pulmonary:     Effort: Pulmonary effort is normal.     Breath sounds: Normal breath  sounds.  Musculoskeletal:     Cervical back: Neck supple.  Skin:    General: Skin is warm and dry.  Neurological:     Mental Status: He is alert and oriented to person, place, and time.  Psychiatric:        Mood and Affect: Mood normal.        Behavior: Behavior normal.        Thought Content: Thought content normal.        Judgment: Judgment normal.          Assessment and Plan   1. Chronic obstructive pulmonary disease, unspecified COPD type ([place])   2. Essential hypertension   3. Mixed hyperlipidemia   4. Vitamin D deficiency   5. Fatigue, unspecified type   6. Encounter for hepatitis C screening test for low risk patient   7. Coronary artery disease of native heart with stable angina pectoris, unspecified vessel or lesion type ([place])      Plan: 1.  We will restart him on Spiriva and see if this helps with his fatigue.  He was encouraged to let me know if he has any concerns or questions regarding this. 2.,  3.,  5.,  7.  He will continue on his current chronic medications as prescribed.  We will check blood work today including lipid panel and metabolic panel, as well as check thyroid panel.  He will continue to follow-up with cardiology as scheduled.  I encouraged him to let me or his cardiologist know if despite trying Spiriva his fatigue is persistent.  He may need further evaluation of his coronary artery disease.  I also encouraged him to make sure he goes and looks at his Imdur bottle at home and verify what dose he is actually taking daily.  He tells me he understands. 4.  We will check serum vitamin D level today. 6.  We will screen for hepatitis C today and his blood work.   Tests ordered Orders Placed This Encounter  Procedures  . Lipid Panel  . CMP with eGFR([facility])  . CBC with Differential/Platelets  . TSH  . T3, Free  . T4, Free  . Vitamin D, 25-hydroxy  . Hep C  Antibody      Meds ordered this encounter  Medications  . isosorbide mononitrate (IMDUR) 60 MG 24 hr tablet    Sig: Take 1 tablet (60 mg total) by mouth daily.    Dispense:  90 tablet    Refill:  0    Order Specific Question:   Supervising Provider    Answer:   Hurshel Party C [[id number]]  . tiotropium (SPIRIVA HANDIHALER) 18 MCG inhalation capsule    Sig: Place 1 capsule (18 mcg total) into inhaler and inhale daily.    Dispense:  30 capsule    Refill:  3    Order Specific Question:   Supervising Provider    Answer:   Doree Albee [[id number]]    Patient to follow-up in 3 months or sooner as needed.  Ailene Ards, NP

## 2020-07-05 ENCOUNTER — Other Ambulatory Visit (INDEPENDENT_AMBULATORY_CARE_PROVIDER_SITE_OTHER): Payer: Self-pay | Admitting: Nurse Practitioner

## 2020-07-05 ENCOUNTER — Telehealth (INDEPENDENT_AMBULATORY_CARE_PROVIDER_SITE_OTHER): Payer: Self-pay

## 2020-07-05 DIAGNOSIS — R5383 Other fatigue: Secondary | ICD-10-CM

## 2020-07-05 LAB — CBC WITH DIFFERENTIAL/PLATELET
Absolute Monocytes: 518 cells/uL (ref 200–950)
Basophils Absolute: 37 cells/uL (ref 0–200)
Basophils Relative: 0.5 %
Eosinophils Absolute: 438 cells/uL (ref 15–500)
Eosinophils Relative: 6 %
HCT: 45.9 % (ref 38.5–50.0)
Hemoglobin: 15.1 g/dL (ref 13.2–17.1)
Lymphs Abs: 920 cells/uL (ref 850–3900)
MCH: 30.7 pg (ref 27.0–33.0)
MCHC: 32.9 g/dL (ref 32.0–36.0)
MCV: 93.3 fL (ref 80.0–100.0)
MPV: 8.9 fL (ref 7.5–12.5)
Monocytes Relative: 7.1 %
Neutro Abs: 5387 cells/uL (ref 1500–7800)
Neutrophils Relative %: 73.8 %
Platelets: 298 10*3/uL (ref 140–400)
RBC: 4.92 10*6/uL (ref 4.20–5.80)
RDW: 13.1 % (ref 11.0–15.0)
Total Lymphocyte: 12.6 %
WBC: 7.3 10*3/uL (ref 3.8–10.8)

## 2020-07-05 LAB — COMPLETE METABOLIC PANEL WITH GFR
AG Ratio: 1.7 (calc) (ref 1.0–2.5)
ALT: 16 U/L (ref 9–46)
AST: 16 U/L (ref 10–35)
Albumin: 4 g/dL (ref 3.6–5.1)
Alkaline phosphatase (APISO): 53 U/L (ref 35–144)
BUN/Creatinine Ratio: 28 (calc) — ABNORMAL HIGH (ref 6–22)
BUN: 30 mg/dL — ABNORMAL HIGH (ref 7–25)
CO2: 27 mmol/L (ref 20–32)
Calcium: 9.5 mg/dL (ref 8.6–10.3)
Chloride: 104 mmol/L (ref 98–110)
Creat: 1.06 mg/dL (ref 0.70–1.18)
GFR, Est African American: 77 mL/min/{1.73_m2} (ref >=60)
GFR, Est Non African American: 66 mL/min/{1.73_m2} (ref >=60)
Globulin: 2.4 g/dL (calc) (ref 1.9–3.7)
Glucose, Bld: 122 mg/dL (ref 65–139)
Potassium: 4.7 mmol/L (ref 3.5–5.3)
Sodium: 139 mmol/L (ref 135–146)
Total Bilirubin: 0.5 mg/dL (ref 0.2–1.2)
Total Protein: 6.4 g/dL (ref 6.1–8.1)

## 2020-07-05 LAB — LIPID PANEL
Cholesterol: 139 mg/dL (ref <200)
HDL: 36 mg/dL — ABNORMAL LOW (ref >=40)
LDL Cholesterol (Calc): 81 mg/dL (calc)
Non-HDL Cholesterol (Calc): 103 mg/dL (calc) (ref <130)
Total CHOL/HDL Ratio: 3.9 (calc) (ref <5.0)
Triglycerides: 128 mg/dL (ref <150)

## 2020-07-05 LAB — HEPATITIS C ANTIBODY
Hepatitis C Ab: NONREACTIVE
SIGNAL TO CUT-OFF: 0 (ref <1.00)

## 2020-07-05 LAB — T4, FREE: Free T4: 1.2 ng/dL (ref 0.8–1.8)

## 2020-07-05 LAB — VITAMIN D 25 HYDROXY (VIT D DEFICIENCY, FRACTURES): Vit D, 25-Hydroxy: 49 ng/mL (ref 30–100)

## 2020-07-05 LAB — TSH: TSH: 0.06 mIU/L — ABNORMAL LOW (ref 0.40–4.50)

## 2020-07-05 LAB — T3, FREE: T3, Free: 6.1 pg/mL — ABNORMAL HIGH (ref 2.3–4.2)

## 2020-07-05 MED ORDER — THYROID 60 MG PO TABS: 60.0000 mg | ORAL_TABLET | Freq: Every day | ORAL | 0 refills | Status: DC

## 2020-07-05 MED ORDER — THYROID 60 MG PO TABS
60.0000 mg | ORAL_TABLET | Freq: Every day | ORAL | 0 refills | Status: DC
Start: 2020-07-05 — End: 2020-10-06

## 2020-07-05 NOTE — Telephone Encounter (Signed)
Prescription reordered and sent to the [facility] in [place].  Thank you.

## 2020-07-05 NOTE — Telephone Encounter (Signed)
Patient called and stated that he needs his NP Thy[facility] the [facility] because [facility].   Please send NP Thyroid to [facility] in [place]. Thank you!

## 2020-08-13 DIAGNOSIS — Z23 Encounter for immunization: Secondary | ICD-10-CM | POA: Diagnosis not present

## 2020-08-28 ENCOUNTER — Other Ambulatory Visit: Payer: Self-pay

## 2020-08-28 ENCOUNTER — Other Ambulatory Visit (INDEPENDENT_AMBULATORY_CARE_PROVIDER_SITE_OTHER): Payer: Medicare Other

## 2020-08-28 DIAGNOSIS — R5383 Other fatigue: Secondary | ICD-10-CM | POA: Diagnosis not present

## 2020-08-29 LAB — T4, FREE: Free T4: 1.2 ng/dL (ref 0.8–1.8)

## 2020-08-29 LAB — T3, FREE: T3, Free: 4.7 pg/mL — ABNORMAL HIGH (ref 2.3–4.2)

## 2020-08-29 LAB — TSH: TSH: 0.84 mIU/L (ref 0.40–4.50)

## 2020-08-31 ENCOUNTER — Telehealth: Payer: Self-pay | Admitting: Cardiology

## 2020-08-31 DIAGNOSIS — R0602 Shortness of breath: Secondary | ICD-10-CM

## 2020-08-31 DIAGNOSIS — Z87891 Personal history of nicotine dependence: Secondary | ICD-10-CM

## 2020-08-31 NOTE — Telephone Encounter (Signed)
Patient calling with c/o chest pain & sob.  Stated that he is feeling better since last office visit & addition of the Imdur 30mg  daily, but still has it.  Notices mostly with exertion.  SOB has not changed - pcp recently put him back on Spiriva.  Stated that the only thing [facility] is [facility].  Says he has never felt really great since his bypass in 2017.  He does not have follow up until 12/04/2020 with Dr. Domenic Polite.  Patient also questions if seeing a pulmonologist would be helpful.  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  Message forward to provider for advice.

## 2020-08-31 NOTE — Telephone Encounter (Signed)
Increase his Imdur to 60 mg/day and refer him to pulmonology secondary to shortness of breath with a significant history of smoking.  Thank you

## 2020-08-31 NOTE — Telephone Encounter (Signed)
Patient called stating that he continues to have some chest pains off and on for a year. Patient is requesting an appointment with Dr. Domenic Polite. When does he need to take a Nitroglycerin?

## 2020-08-31 NOTE — Telephone Encounter (Signed)
No answer

## 2020-09-04 MED ORDER — ISOSORBIDE MONONITRATE ER 60 MG PO TB24: 60.0000 mg | ORAL_TABLET | Freq: Every day | ORAL | 6 refills | Status: DC

## 2020-09-04 NOTE — Telephone Encounter (Signed)
Patient notified and verbalized understanding.  New prescription sent to [facility].  Referral entered into epic & pcc made aware.

## 2020-09-04 NOTE — Telephone Encounter (Signed)
Left message to return call 

## 2020-10-03 ENCOUNTER — Ambulatory Visit (INDEPENDENT_AMBULATORY_CARE_PROVIDER_SITE_OTHER): Payer: Medicare Other | Admitting: Nurse Practitioner

## 2020-10-03 ENCOUNTER — Other Ambulatory Visit: Payer: Self-pay

## 2020-10-03 ENCOUNTER — Telehealth (INDEPENDENT_AMBULATORY_CARE_PROVIDER_SITE_OTHER): Payer: Self-pay | Admitting: Nurse Practitioner

## 2020-10-03 ENCOUNTER — Encounter (INDEPENDENT_AMBULATORY_CARE_PROVIDER_SITE_OTHER): Payer: Self-pay | Admitting: Nurse Practitioner

## 2020-10-03 VITALS — BP 138/62 | HR 81 | Temp 97.3°F | Ht 67.0 in | Wt 164.6 lb

## 2020-10-03 DIAGNOSIS — I25118 Atherosclerotic heart disease of native coronary artery with other forms of angina pectoris: Secondary | ICD-10-CM | POA: Diagnosis not present

## 2020-10-03 DIAGNOSIS — E785 Hyperlipidemia, unspecified: Secondary | ICD-10-CM

## 2020-10-03 DIAGNOSIS — J449 Chronic obstructive pulmonary disease, unspecified: Secondary | ICD-10-CM | POA: Diagnosis not present

## 2020-10-03 DIAGNOSIS — I10 Essential (primary) hypertension: Secondary | ICD-10-CM

## 2020-10-03 NOTE — Telephone Encounter (Signed)
Called patient and left a detailed voice message to call back to discuss message.

## 2020-10-03 NOTE — Progress Notes (Signed)
Subjective:  Patient ID: William [name]., male    DOB: Apr 10, 1940  Age: 80 y.o. MRN: [id number]  CC:  Chief Complaint  Patient presents with   Follow-up    Still concerned about his heart, having to take nitroglycerin tablets often, getting in bad physical shape and wants to know if it is safe to exercise   COPD   Coronary Artery Disease   Hypertension   Hyperlipidemia      HPI  This patient arrives today for the above.  COPD: He continues on Spiriva and is scheduled to follow-up with a pulmonologist later this week.  He is up-to-date on pneumonia and COVID-19 vaccines.  He tells me he feels that his breathing is stable he has not been hospitalized or needed treatment for COPD exacerbation within the last year.  CAD/hypertension: He does follow with cardiology and is scheduled to see cardiologist again in September of this year.  He continues on amlodipine, Imdur, lisinopril, metoprolol and tolerates medications well.  He does take nitroglycerin as needed for chest pain.  He has been experiencing exertional chest pain and has been having to take 1 sublingual tablet nitroglycerin with exertion about once a week.  He tells me 1 tablet will relieve his pain within 5 minutes or so.  He would like to start exercising again but wanted to make sure that it was safe to do so.  He did have a stress test completed within the last year, which showed intermediate risk for ischemia.  Hyperlipidemia: He continues on aspirin, fish oil, rosuvastatin.  Last LDL was collected about 3 months ago and it was 81.  Past Medical History:  Diagnosis Date   Carotid artery occlusion    Colon polyp    COPD (chronic obstructive pulmonary disease) (HCC)    Coronary artery disease    Hyperlipidemia    Hypertension    Iliac artery aneurysm (HCC)    Myocardial infarction ([place])    Peripheral arterial disease (HCC)       Family History  Problem Relation Age of Onset   Heart disease Father         Heart Disease before age 40   Pulmonary embolism Father    Deep vein thrombosis Father    Cancer Mother        Sarcoma    Social History   Social History Narrative   Retired used to work in [employer]. Widower. Lives alone.   Social History   Tobacco Use   Smoking status: Former    Packs/day: 2.00    Years: 30.00    Pack years: 60.00    Types: Cigarettes    Start date: 06/03/1961    Quit date: 11/17/1991    Years since quitting: 28.8   Smokeless tobacco: Former    Types: Snuff  Substance Use Topics   Alcohol use: Not Currently    Alcohol/week: 2.0 - 4.0 standard drinks    Types: 2 - 4 Standard drinks or equivalent per week    Comment: used to      Current Meds  Medication Sig   amLODipine (NORVASC) 10 MG tablet Take 1 tablet (10 mg total) by mouth daily.   Ascorbic Acid (VITAMIN C) 1000 MG tablet Take 1,000 mg by mouth daily.   aspirin EC 81 MG tablet Take 1 tablet (81 mg total) by mouth daily.   Cholecalciferol 1.25 MG (50000 UT) TABS Take 1 tablet by mouth daily.   fish oil-omega-3 fatty  acids 1000 MG capsule Take 1,000 g by mouth daily.    folic acid (FOLVITE) 350 MCG tablet Take 400 mcg by mouth daily.   Garlic 0938 MG TABS Take 1,250 mg by mouth daily.    isosorbide mononitrate (IMDUR) 60 MG 24 hr tablet Take 1 tablet (60 mg total) by mouth daily.   lisinopril (ZESTRIL) 40 MG tablet TAKE 1 TABLET BY MOUTH EVERY DAY   metoprolol tartrate (LOPRESSOR) 25 MG tablet TAKE 1 TABLET BY MOUTH TWICE A DAY   Misc. Devices MISC by Does not apply route. CPAP   Multiple Vitamin (MULTIVITAMIN) tablet Take 1 tablet by mouth daily.   nitroGLYCERIN (NITROSTAT) 0.4 MG SL tablet Place 1 tablet (0.4 mg total) under the tongue every 5 (five) minutes as needed for chest pain. If pain does not resolve with rest.   rosuvastatin (CRESTOR) 10 MG tablet TAKE 1 TABLET BY MOUTH EVERY DAY N   thiamine (VITAMIN B-1) 100 MG tablet Take 100 mg by mouth every evening.    thyroid (NP THYROID) 60  MG tablet Take 1 tablet (60 mg total) by mouth daily before breakfast.   tiotropium (SPIRIVA HANDIHALER) 18 MCG inhalation capsule Place 1 capsule (18 mcg total) into inhaler and inhale daily.   vitamin E 400 UNIT capsule Take 400 Units by mouth daily.    ROS:  Review of Systems  Respiratory:  Positive for shortness of breath.   Cardiovascular:  Positive for chest pain.  Neurological:  Negative for dizziness and headaches.    Objective:   Today's Vitals: BP 138/62   Pulse 81   Temp (!) 97.3 F (36.3 C) (Temporal)   Ht 5\' 7"  (1.702 m)   Wt 164 lb 9.6 oz (74.7 kg)   SpO2 94%   BMI 25.78 kg/m  Vitals with BMI 10/03/2020 07/04/2020 05/28/2020  Height 5\' 7"  5\' 7"  5\' 7"   Weight 164 lbs 10 oz 165 lbs 3 oz 162 lbs 10 oz  BMI 25.77 18.29 93.71  Systolic 696 789 381  Diastolic 62 64 64  Pulse 81 73 62     Physical Exam Vitals reviewed.  Constitutional:      Appearance: Normal appearance.  HENT:     Head: Normocephalic and atraumatic.  Cardiovascular:     Rate and Rhythm: Normal rate and regular rhythm.  Pulmonary:     Effort: Pulmonary effort is normal.     Breath sounds: Normal breath sounds.  Musculoskeletal:     Cervical back: Neck supple.  Skin:    General: Skin is warm and dry.  Neurological:     Mental Status: He is alert and oriented to person, place, and time.  Psychiatric:        Mood and Affect: Mood normal.        Behavior: Behavior normal.        Thought Content: Thought content normal.        Judgment: Judgment normal.         Assessment and Plan   1. Chronic obstructive pulmonary disease, unspecified COPD type ([place])   2. Essential hypertension   3. Hyperlipidemia, unspecified hyperlipidemia type   4. Coronary artery disease of native heart with stable angina pectoris, unspecified vessel or lesion type [facility])      Plan: Patient to continue on spiriva and follow-up with pulmonologist as scheduled. - 4. Patient to continue current medication regimen  and follow-up with cardiology as scheduled. I will reach out to his cardiologist regarding safety of starting an  exercise program and then will let the patient know their recommendations.    Tests ordered No orders of the defined types were placed in this encounter.     No orders of the defined types were placed in this encounter.   Patient to follow-up in 3 months or sooner as needed.   Ailene Ards, NP

## 2020-10-03 NOTE — Telephone Encounter (Signed)
Megan, please call this patient and let him know that I heard back from an associate of Dr. Domenic Polite as Dr. Domenic Polite is actually on vacation this week.  Based on his stable angina and recent stress test in the system, Dr. Myles Gip  associate Bernerd Pho, [place]) feels the patient could try a low impact exercise program as long as he keeps his nitroglycerin tablets on hand, and he should treat any chest pain with a dose of nitroglycerin and rest. Examples of light exertional exercise include light walking (or other cardio), light stretching/strength training). He should start out with low level exertion and should be able to talk during exercise (this is a means of gauging level of exertion), If the patient starts to experience worsening chest pain and has to take the nitroglycerin tablets more than once a week due to starting exercise, he should stop the exercise program and notify his cardiologist.  They may plan on doing cardiac catheterization and/or try to get him in to be seen sooner than currently scheduled.  If the patient has any further questions please let me know.

## 2020-10-03 NOTE — Telephone Encounter (Signed)
Called patient back and gave him the message from Judson Roch. Patient verbalized an understanding and did not have any questions at this time and thanked [place].

## 2020-10-05 ENCOUNTER — Other Ambulatory Visit (INDEPENDENT_AMBULATORY_CARE_PROVIDER_SITE_OTHER): Payer: Self-pay | Admitting: Nurse Practitioner

## 2020-10-05 DIAGNOSIS — R5383 Other fatigue: Secondary | ICD-10-CM

## 2020-10-11 ENCOUNTER — Ambulatory Visit (INDEPENDENT_AMBULATORY_CARE_PROVIDER_SITE_OTHER): Payer: Medicare Other | Admitting: Internal Medicine

## 2020-10-11 ENCOUNTER — Ambulatory Visit (HOSPITAL_COMMUNITY)
Admission: RE | Admit: 2020-10-11 | Discharge: 2020-10-11 | Disposition: A | Discharge status: Home / Self Care | Payer: Medicare Other | Source: Ambulatory Visit | Attending: Internal Medicine | Admitting: Internal Medicine

## 2020-10-11 ENCOUNTER — Other Ambulatory Visit: Payer: Self-pay

## 2020-10-11 ENCOUNTER — Encounter: Payer: Self-pay | Admitting: Internal Medicine

## 2020-10-11 DIAGNOSIS — J449 Chronic obstructive pulmonary disease, unspecified: Secondary | ICD-10-CM

## 2020-10-11 DIAGNOSIS — R9389 Abnormal findings on diagnostic imaging of other specified body structures: Secondary | ICD-10-CM

## 2020-10-11 DIAGNOSIS — I6523 Occlusion and stenosis of bilateral carotid arteries: Secondary | ICD-10-CM | POA: Diagnosis not present

## 2020-10-11 DIAGNOSIS — R0602 Shortness of breath: Secondary | ICD-10-CM | POA: Diagnosis not present

## 2020-10-11 MED ORDER — STIOLTO RESPIMAT 2.5-2.5 MCG/ACT IN AERS: 2.0000 | INHALATION_SPRAY | Freq: Every day | RESPIRATORY_TRACT | 11 refills | Status: DC

## 2020-10-11 MED ORDER — STIOLTO RESPIMAT 2.5-2.5 MCG/ACT IN AERS: 2.0000 | INHALATION_SPRAY | Freq: Every day | RESPIRATORY_TRACT | 0 refills | Status: DC

## 2020-10-11 NOTE — Progress Notes (Addendum)
William [name]., male    DOB: [birth year],    MRN: [id number]   Brief patient profile:  25 yowm quit smoking 1993  with GOLD 2 COPD  criteria 05/2015 while on spiriva with reported doe = [facility]MRC 1  but doesn't think the spiriva dpi is working as well and also limited by claudication and sometimes ex cp with known IHD so referred to pulmonary clinic in [place]  10/11/2020 by Dr    Domenic Polite     History of Present Illness  10/11/2020  Pulmonary/ 1st office eval/ Jeral[facility] Vaqu[facility] / [name]  Chief Complaint  Patient presents with   Consult    Patient has COPD and patient states that he has been having trouble with his heart and was sent here to make sure his lungs are ok. Wife died of lung cancer. Shortness of breath with exertion. Takes Spiriva  Dyspnea:  legs usually stop him 1st x 100 ft  flat and slow    Cough: not much Sleep: flat bed/ one pillow  SABA use: none   No obvious day to day or daytime variability or assoc excess/ purulent sputum or mucus plugs or hemoptysis or  subjective wheeze or overt sinus or hb symptoms.   Sleeping ok as above  without nocturnal  or early am exacerbation  of respiratory  c/o's or need for noct saba. Also denies any obvious fluctuation of symptoms with weather or environmental changes or other aggravating or alleviating factors except as outlined above   No unusual exposure hx or h/o childhood pna/ asthma or knowledge of premature birth.  Current Allergies, Complete Past Medical History, Past Surgical History, Family History, and Social History were reviewed in [employer] record.  ROS  The following are not active complaints unless bolded Hoarseness, sore throat, dysphagia, dental problems, itching, sneezing,  nasal congestion or discharge of excess mucus or purulent secretions, ear ache,   fever, chills, sweats, unintended wt loss or wt gain, classically pleuritic  cp,  orthopnea pnd or arm/hand swelling  or leg swelling,  presyncope, palpitations, abdominal pain, anorexia, nausea, vomiting, diarrhea  or change in bowel habits or change in bladder habits, change in stools or change in urine, dysuria, hematuria,  rash, arthralgias, visual complaints, headache, numbness, weakness or ataxia or problems with walking or coordination,  change in mood or  memory.           Past Medical History:  Diagnosis Date   Carotid artery occlusion    Colon polyp    COPD (chronic obstructive pulmonary disease) (HCC)    Coronary artery disease    Hyperlipidemia    Hypertension    Iliac artery aneurysm (HCC)    Myocardial infarction ([place])    Peripheral arterial disease (HCC)     Outpatient Medications Prior to Visit  Medication Sig Dispense Refill   amLODipine (NORVASC) 10 MG tablet Take 1 tablet (10 mg total) by mouth daily. 90 tablet 3   Ascorbic Acid (VITAMIN C) 1000 MG tablet Take 1,000 mg by mouth daily.     aspirin EC 81 MG tablet Take 1 tablet (81 mg total) by mouth daily.     Cholecalciferol 1.25 MG (50000 UT) TABS Take 1 tablet by mouth daily.     fish oil-omega-3 fatty acids 1000 MG capsule Take 1,000 g by mouth daily.      folic acid (FOLVITE) 660 MCG tablet Take 400 mcg by mouth daily.     Garlic 6301 MG TABS Take  1,250 mg by mouth daily.      isosorbide mononitrate (IMDUR) 60 MG 24 hr tablet Take 1 tablet (60 mg total) by mouth daily. 30 tablet 6   lisinopril (ZESTRIL) 40 MG tablet TAKE 1 TABLET BY MOUTH EVERY DAY 90 tablet 1   metoprolol tartrate (LOPRESSOR) 25 MG tablet TAKE 1 TABLET BY MOUTH TWICE A DAY 180 tablet 1   Misc. Devices MISC by Does not apply route. CPAP     Multiple Vitamin (MULTIVITAMIN) tablet Take 1 tablet by mouth daily.     nitroGLYCERIN (NITROSTAT) 0.4 MG SL tablet Place 1 tablet (0.4 mg total) under the tongue every 5 (five) minutes as needed for chest pain. If pain does not resolve with rest. 50 tablet 3   NP THYROID 60 MG tablet TAKE 1 TABLET BY MOUTH ONCE DAILY BEFORE BREAKFAST 90  tablet 0   rosuvastatin (CRESTOR) 10 MG tablet TAKE 1 TABLET BY MOUTH EVERY DAY N 90 tablet 3   thiamine (VITAMIN B-1) 100 MG tablet Take 100 mg by mouth every evening.      tiotropium (SPIRIVA HANDIHALER) 18 MCG inhalation capsule Place 1 capsule (18 mcg total) into inhaler and inhale daily. 30 capsule 3   vitamin E 400 UNIT capsule Take 400 Units by mouth daily.     No facility-administered medications prior to visit.     Objective:     BP 120/72 (BP Location: Right Arm, Patient Position: Sitting, Cuff Size: Normal)   Pulse 77   Temp (!) 96.4 F (35.8 C) (Oral)   Ht 5\' 7"  (1.702 m)   Wt 166 lb (75.3 kg)   SpO2 93%   BMI 26.00 kg/m   SpO2: 93 %  Pleasant wmb wm nad  HEENT : pt wearing mask not removed for exam due to covid - 19 concerns.   NECK :  without JVD/Nodes/TM/ nl carotid upstrokes bilaterally   LUNGS: no acc muscle use,  Min barrel  contour chest wall with bilateral  slightly decreased bs s audible wheeze and  without cough on insp or exp maneuvers and min  Hyperresonant  to  percussion bilaterally     CV:  RRR  no s3 or murmur or increase in P2, and no edema   ABD:  soft and nontender with pos end  insp Hoover's  in the supine position. No bruits or organomegaly appreciated, bowel sounds nl  MS:   Nl gait/  ext warm without deformities, calf tenderness, cyanosis or clubbing No obvious joint restrictions   SKIN: warm and dry without lesions    NEURO:  alert, approp, nl sensorium with  no motor or cerebellar deficits apparent.       CXR PA and Lateral:   10/11/2020 :    I personally reviewed images / impression as follows:    Mild cm/ mod kyphosis and copd  10/18/2020 late add radiology report: Hyperinflation of the lungs consistent with history of COPD.   Mild opacity in the right base could represent atelectasis or developing infiltrate. Recommend short-term follow-up imaging to ensure resolution.Mild linear opacity in left base is likely atelectasis.            Assessment   COPD GOLD 2  Quit smoking 1993 - Spirometry 10/11/2020  FEV1 1.95 (74%)  Ratio 0.66 ? p spiriva - 10/11/2020  After extensive coaching inhaler device,  effectiveness =    90% try stiolto 2 puffs each am    Pt is Group B in terms of symptom/risk  and laba/lama therefore appropriate rx at this point >>>  stiolto trial if tolerates to see if airflow really is what's limiting him (vs AP or claudication)   If not keep in mind:  When respiratory symptoms begin or become refractory well after a patient reports complete smoking cessation,  Especially when this wasn't the case while they were smoking, a red flag is raised based on the work of Dr Kris Mouton which states:  if you quit smoking when your best day FEV1 is still well preserved it is highly unlikely you will progress to severe disease.  That is to say, once the smoking stops,  the symptoms should not suddenly erupt or markedly worsen.  If so, the differential diagnosis should include  obesity/deconditioning,  LPR/Reflux/Aspiration syndromes,  occult CHF or myocardial ischemia, or  especially side effect of medications commonly used in this population, esp acei, so  needs 4 week min trial off it and replace with benicar 40 mg and self monitor bp if still feels limited by sob on vs off stiolto (once baseline establshed)    No need to repeat pfts at this point and f/u in pulmonary clinic can be as needed.     Each maintenance medication was reviewed in detail including emphasizing most importantly the difference between maintenance and prns and under what circumstances the prns are to be triggered using an action plan format where appropriate.  Total time for H and P, chart review, counseling,  directly observing portions of ambulatory 02 saturation study/ and generating customized AVS unique to this office visit / same day charting = 53 min                   Abnormal CXR - late add 10/18/2020  See cxr 10/11/20 >>  rec  CT chest 10/18/2020   My indecx of suspicion for lung ca or ILD is low but will rec ct s contrast to close the loop     Christinia Gully, MD 10/11/2020

## 2020-10-11 NOTE — Assessment & Plan Note (Addendum)
Quit smoking 1993 - Spirometry 10/11/2020  FEV1 1.95 (74%)  Ratio 0.66 ? p spiriva - 10/11/2020  After extensive coaching inhaler device,  effectiveness =    90% try stiolto 2 puffs each am    Pt is Group B in terms of symptom/risk and laba/lama therefore appropriate rx at this point >>>  stiolto trial if tolerates to see if airflow really is what's limiting him (vs AP or claudication)   If not keep in mind:  When respiratory symptoms begin or become refractory well after a patient reports complete smoking cessation,  Especially when this wasn't the case while they were smoking, a red flag is raised based on the work of Dr Kris Mouton which states:  if you quit smoking when your best day FEV1 is still well preserved it is highly unlikely you will progress to severe disease.  That is to say, once the smoking stops,  the symptoms should not suddenly erupt or markedly worsen.  If so, the differential diagnosis should include  obesity/deconditioning,  LPR/Reflux/Aspiration syndromes,  occult CHF or myocardial ischemia, or  especially side effect of medications commonly used in this population, esp acei, so  needs 4 week min trial off it and replace with benicar 40 mg and self monitor bp if still feels limited by sob on vs off stiolto (once baseline establshed)    No need to repeat pfts at this point and f/u in pulmonary clinic can be as needed.     Each maintenance medication was reviewed in detail including emphasizing most importantly the difference between maintenance and prns and under what circumstances the prns are to be triggered using an action plan format where appropriate.  Total time for H and P, chart review, counseling,  directly observing portions of ambulatory 02 saturation study/ and generating customized AVS unique to this office visit / same day charting = 53 min

## 2020-10-11 NOTE — Patient Instructions (Addendum)
Stop spiriva and start Stiolto 2 puffs first thing in Am x 2 weeks then try 1 puff daily x 1 weeks then try nothing to see if it really make a difference  Work on inhaler technique:  relax and gently blow all the way out then take a nice smooth full deep breath back in, triggering the inhaler at same time you start breathing in.  Hold for up to 5 seconds if you can.   Rinse and gargle with water when done.     If worse chest pains with activity on the stiolto 2 puffs, can try one puff instead but if it persists, stop stiolto  If even the 2 puffs doesn't help your breathing , you need a trial off lisinopril 40 and on benicar 40  for at least 4 weeks - call if you want to try it and I'll call it in for you.  Please remember to go to the  x-ray department  @  [facility] for your tests - we will call you with the results when they are available      If you are satisfied with your treatment plan,  let your doctor know and he/she can either refill your medications or you can return here when your prescription runs out.     If in any way you are not 100% satisfied,  please tell us.  If 100% better, tell your friends!  Pulmonary follow up is as needed .

## 2020-10-13 ENCOUNTER — Encounter: Payer: Self-pay | Admitting: Internal Medicine

## 2020-10-17 ENCOUNTER — Ambulatory Visit (HOSPITAL_COMMUNITY)
Admission: RE | Admit: 2020-10-17 | Discharge: 2020-10-17 | Disposition: A | Discharge status: Home / Self Care | Payer: Medicare Other | Source: Ambulatory Visit | Attending: Vascular Surgery | Admitting: Vascular Surgery

## 2020-10-17 ENCOUNTER — Ambulatory Visit (INDEPENDENT_AMBULATORY_CARE_PROVIDER_SITE_OTHER): Payer: Medicare Other | Admitting: Vascular Surgery

## 2020-10-17 ENCOUNTER — Encounter: Payer: Self-pay | Admitting: Vascular Surgery

## 2020-10-17 ENCOUNTER — Other Ambulatory Visit: Payer: Self-pay

## 2020-10-17 VITALS — BP 103/65 | HR 67 | Temp 98.4°F | Resp 18 | Ht 66.0 in | Wt 166.3 lb

## 2020-10-17 DIAGNOSIS — I6523 Occlusion and stenosis of bilateral carotid arteries: Secondary | ICD-10-CM

## 2020-10-17 DIAGNOSIS — I739 Peripheral vascular disease, unspecified: Secondary | ICD-10-CM

## 2020-10-17 NOTE — Progress Notes (Signed)
REASON FOR VISIT:   Follow-up of peripheral vascular disease.  MEDICAL ISSUES:   PERIPHERAL VASCULAR DISEASE: This patient has stable claudication of both lower extremities with no rest pain or nonhealing ulcers.  He gets chest pain and leg pain with walking so I think the chest pain is a more pressing issue.  Currently his symptoms are stable.  I think the more pressing issue is his chest pain.  I have ordered follow-up ABIs in 6 months and I will see him back at that time.  If his symptoms progress then I would be happy to see him sooner and we could consider repeating his arteriogram.  However based on his previous study he really does not have any great options for revascularization especially on the right or he has a common femoral artery occlusion and significant disease in his deep femoral artery.  Fortunately he is not a smoker.  I have encouraged him to try to stay as active as possible.   HPI:   William [name]. is a pleasant 80 y.o. male who I have been following with peripheral vascular disease.  I last saw him in January of this year.  He had undergone an arteriogram in May 2019 which showed multilevel arterial occlusive disease and no good options for revascularization.  In addition at the last visit he had bilateral carotid bruits.  However his carotid duplex scan did not show any significant carotid disease at that time.    Since I saw him last, he continues to have claudication in both lower extremities.  His symptoms are brought on by ambulation and relieved with rest.  He also gets chest pain with exercise about the same time.  Symptoms are equal in both legs.  He denies any history of rest pain or nonhealing ulcers.  He did have an exercise stress test in February of this year and there were no EKG changes with exercise.    Past Medical History:  Diagnosis Date   Carotid artery occlusion    Colon polyp    COPD (chronic obstructive pulmonary disease) (HCC)     Coronary artery disease    Hyperlipidemia    Hypertension    Iliac artery aneurysm (HCC)    Myocardial infarction ([place])    Peripheral arterial disease ([place])     Family History  Problem Relation Age of Onset   Heart disease Father        Heart Disease before age 74   Pulmonary embolism Father    Deep vein thrombosis Father    Cancer Mother        Sarcoma    SOCIAL HISTORY: Social History   Tobacco Use   Smoking status: Former    Packs/day: 2.00    Years: 30.00    Pack years: 60.00    Types: Cigarettes    Start date: 06/03/1961    Quit date: 11/17/1991    Years since quitting: 28.9   Smokeless tobacco: Former    Types: Snuff  Substance Use Topics   Alcohol use: Not Currently    Alcohol/week: 2.0 - 4.0 standard drinks    Types: 2 - 4 Standard drinks or equivalent per week    Comment: used to     No Known Allergies  Current Outpatient Medications  Medication Sig Dispense Refill   amLODipine (NORVASC) 10 MG tablet Take 1 tablet (10 mg total) by mouth daily. 90 tablet 3   Ascorbic Acid (VITAMIN C) 1000 MG tablet Take 1,000  mg by mouth daily.     aspirin EC 81 MG tablet Take 1 tablet (81 mg total) by mouth daily.     Cholecalciferol 1.25 MG (50000 UT) TABS Take 1 tablet by mouth daily.     fish oil-omega-3 fatty acids 1000 MG capsule Take 1,000 g by mouth daily.      folic acid (FOLVITE) 188 MCG tablet Take 400 mcg by mouth daily.     Garlic 4166 MG TABS Take 1,250 mg by mouth daily.      isosorbide mononitrate (IMDUR) 60 MG 24 hr tablet Take 1 tablet (60 mg total) by mouth daily. 30 tablet 6   lisinopril (ZESTRIL) 40 MG tablet TAKE 1 TABLET BY MOUTH EVERY DAY 90 tablet 1   metoprolol tartrate (LOPRESSOR) 25 MG tablet TAKE 1 TABLET BY MOUTH TWICE A DAY 180 tablet 1   Misc. Devices MISC by Does not apply route. CPAP     Multiple Vitamin (MULTIVITAMIN) tablet Take 1 tablet by mouth daily.     nitroGLYCERIN (NITROSTAT) 0.4 MG SL tablet Place 1 tablet (0.4 mg total) under the  tongue every 5 (five) minutes as needed for chest pain. If pain does not resolve with rest. 50 tablet 3   NP THYROID 60 MG tablet TAKE 1 TABLET BY MOUTH ONCE DAILY BEFORE BREAKFAST 90 tablet 0   rosuvastatin (CRESTOR) 10 MG tablet TAKE 1 TABLET BY MOUTH EVERY DAY N 90 tablet 3   thiamine (VITAMIN B-1) 100 MG tablet Take 100 mg by mouth every evening.      Tiotropium Bromide-Olodaterol (STIOLTO RESPIMAT) 2.5-2.5 MCG/ACT AERS Inhale 2 puffs into the lungs daily. 1 each 11   Tiotropium Bromide-Olodaterol (STIOLTO RESPIMAT) 2.5-2.5 MCG/ACT AERS Inhale 2 puffs into the lungs daily. 4 g 0   vitamin E 400 UNIT capsule Take 400 Units by mouth daily.     No current facility-administered medications for this visit.    REVIEW OF SYSTEMS:  [X]  denotes positive finding, [ ]  denotes negative finding Cardiac  Comments:  Chest pain or chest pressure: x   Shortness of breath upon exertion: x   Short of breath when lying flat:    Irregular heart rhythm:        Vascular    Pain in calf, thigh, or hip brought on by ambulation: x   Pain in feet at night that wakes you up from your sleep:     Blood clot in your veins:    Leg swelling:         Pulmonary    Oxygen at home:    Productive cough:     Wheezing:         Neurologic    Sudden weakness in arms or legs:     Sudden numbness in arms or legs:     Sudden onset of difficulty speaking or slurred speech:    Temporary loss of vision in one eye:     Problems with dizziness:         Gastrointestinal    Blood in stool:     Vomited blood:         Genitourinary    Burning when urinating:     Blood in urine:        Psychiatric    Major depression:         Hematologic    Bleeding problems:    Problems with blood clotting too easily:        Skin    Rashes or  ulcers:        Constitutional    Fever or chills:     PHYSICAL EXAM:   Vitals:   10/17/20 1016  BP: 103/65  Pulse: 67  Resp: 18  Temp: 98.4 F (36.9 C)  TempSrc: Temporal   SpO2: 92%  Weight: 166 lb 4.8 oz (75.4 kg)  Height: 5\' 6"  (1.676 m)    GENERAL: The patient is a well-nourished male, in no acute distress. The vital signs are documented above. CARDIAC: There is a regular rate and rhythm.  VASCULAR: I do not detect carotid bruits. On the right side he has a high femoral pulse but then the common femoral artery below that has no pulse.  I cannot palpate pedal pulses. On the left side he has a palpable femoral pulse. Both feet appear adequately perfused. PULMONARY: There is good air exchange bilaterally without wheezing or rales. ABDOMEN: Soft and non-tender with normal pitched bowel sounds.  MUSCULOSKELETAL: There are no major deformities or cyanosis. NEUROLOGIC: No focal weakness or paresthesias are detected. SKIN: There are no ulcers or rashes noted. PSYCHIATRIC: The patient has a normal affect.  DATA:    ARTERIAL DOPPLER STUDY: I have independently interpreted his arterial Doppler study today.  On the right side he has a dampened monophasic posterior tibial signal with no dorsalis pedis signal.  ABIs 30%.  On the left side he has a dampened monophasic dorsalis pedis signal with a monophasic posterior tibial signal.  ABIs 42%.  [name] [facility] [phone]

## 2020-10-18 ENCOUNTER — Telehealth: Payer: Self-pay | Admitting: Internal Medicine

## 2020-10-18 DIAGNOSIS — R9389 Abnormal findings on diagnostic imaging of other specified body structures: Secondary | ICD-10-CM

## 2020-10-18 NOTE — Telephone Encounter (Signed)
William Rockers, MD  10/18/2020 10:17 AM EDT      Call pt:  Reviewed cxr and radiology is concerned about two areas, one on each side - I thinks it's nothing to worry about but would rec CT s contrast at [facility] then I will call back to discuss whether additonal w/u needed

## 2020-10-18 NOTE — Telephone Encounter (Signed)
Pt was notified of results and was agreeable to chest ct so I have placed an order for this.

## 2020-10-18 NOTE — Progress Notes (Signed)
[facility] 7/21/

## 2020-10-18 NOTE — Assessment & Plan Note (Signed)
See cxr 10/11/20 >>  rec CT chest 10/18/2020   My indecx of suspicion for lung ca or ILD is low but will rec ct s contrast to close the loop

## 2020-10-19 ENCOUNTER — Other Ambulatory Visit: Payer: Self-pay

## 2020-10-19 DIAGNOSIS — I739 Peripheral vascular disease, unspecified: Secondary | ICD-10-CM

## 2020-10-29 ENCOUNTER — Other Ambulatory Visit (INDEPENDENT_AMBULATORY_CARE_PROVIDER_SITE_OTHER): Payer: Self-pay | Admitting: Nurse Practitioner

## 2020-10-29 DIAGNOSIS — J449 Chronic obstructive pulmonary disease, unspecified: Secondary | ICD-10-CM

## 2020-11-01 ENCOUNTER — Other Ambulatory Visit: Payer: Self-pay

## 2020-11-01 ENCOUNTER — Ambulatory Visit (HOSPITAL_COMMUNITY)
Admission: RE | Admit: 2020-11-01 | Discharge: 2020-11-01 | Disposition: A | Discharge status: Home / Self Care | Payer: Medicare Other | Source: Ambulatory Visit | Attending: Internal Medicine | Admitting: Internal Medicine

## 2020-11-01 DIAGNOSIS — R9389 Abnormal findings on diagnostic imaging of other specified body structures: Secondary | ICD-10-CM | POA: Diagnosis not present

## 2020-11-01 DIAGNOSIS — I700 Atherosclerosis of aorta: Secondary | ICD-10-CM | POA: Diagnosis not present

## 2020-11-01 DIAGNOSIS — J439 Emphysema, unspecified: Secondary | ICD-10-CM | POA: Diagnosis not present

## 2020-11-07 ENCOUNTER — Telehealth: Payer: Self-pay | Admitting: Internal Medicine

## 2020-11-07 MED ORDER — OLMESARTAN MEDOXOMIL 40 MG PO TABS: 40.0000 mg | ORAL_TABLET | Freq: Every day | ORAL | 11 refills | Status: DC

## 2020-11-07 NOTE — Progress Notes (Signed)
Pt notified of results/recs  See phone note dated 11/07/20

## 2020-11-07 NOTE — Telephone Encounter (Signed)
Spoke with the pt  He was given results of his CT Chest  He states would like to try off ACE as rec at last ov since the CP is persisting  The rec was:   Stop spiriva and start Stiolto 2 puffs first thing in Am x 2 weeks then try 1 puff daily x 1 weeks then try nothing to see if it really make a difference   Work on inhaler technique:  relax and gently blow all the way out then take a nice smooth full deep breath back in, triggering the inhaler at same time you start breathing in.  Hold for up to 5 seconds if you can.   Rinse and gargle with water when done.     If worse chest pains with activity on the stiolto 2 puffs, can try one puff instead but if it persists, stop stiolto   If even the 2 puffs doesn't help your breathing , you need a trial off lisinopril 40 and on benicar 40  for at least 4 weeks - call if you want to try it and I'll call it in for you.  He states he has tried stiolto 2 puffs daily and then went to 1 puff daily and still has some chest discomfort. I told him in this case d/c lisinopril and stiolto and I called in the [place]. Appt scheduled for 12/20/20 3:30 IN [place]

## 2020-12-03 ENCOUNTER — Encounter: Payer: Self-pay | Admitting: Cardiology

## 2020-12-03 NOTE — Progress Notes (Signed)
Cardiology Office Note  Date: 12/04/2020   ID: William Bachelor., DOB [birth year], MRN [id number]  PCP:  Ailene Ards, NP  Cardiologist:  Rozann Lesches, MD Electrophysiologist:  None   Chief Complaint  Patient presents with   Cardiac follow-up     History of Present Illness: William Frieder. is a 80 y.o. male former patient of Dr. Bronson Ing now presenting to establish follow-up with me.  I reviewed his records and updated the chart.  He was last seen in February by Mr. Leonides Sake NP.  He reports continued angina symptoms, especially if he speeds up with an activity.  He had to stop going to the [facility] to exercise due to angina just walking in the parking lot.  This is despite continued medical therapy including up titration of Imdur.  Lexiscan Myoview arranged by Mr. Leonides Sake NP back in February was abnormal, overall intermediate risk with evidence of previous anteroseptal infarct and moderate peri-infarct ischemia, also TID ratio of 1.31 suggesting higher risk.  LVEF was normal range.  Follow-up noted with [facility] in July, I reviewed Dr. Nicole Cella note.  He has a history of severe PAD with claudication, managed medically at this time with recent ABIs severely abnormal bilaterally.  Today we went over his medications, he reports compliance.  I talked with him about the risks and benefits of considering a follow-up diagnostic cardiac catheterization to reassess both native vessel and bypass anatomy for revascularization options.  He is in agreement to proceed.  Of note, he did have a chest CT done earlier in August that did show evidence of proximal left subclavian calcification which could potentially also be affecting his LIMA.  Past Medical History:  Diagnosis Date   Carotid artery disease ([place])    Colon polyp    COPD (chronic obstructive pulmonary disease) ([place])    Coronary artery disease    Multivessel status post CABG with LIMA to LAD and SVG to OM1 2017   Hyperlipidemia     Hypertension    Iliac artery aneurysm (HCC)    Myocardial infarction ([place])    Peripheral arterial disease ([place])    Suboptimal revascularization options    Past Surgical History:  Procedure Laterality Date   ABDOMINAL AORTOGRAM W/LOWER EXTREMITY N/A 08/21/2017   Procedure: ABDOMINAL AORTOGRAM W/LOWER EXTREMITY;  Surgeon: Angelia Mould, MD;  Location: [facility];  Service: Cardiovascular;  Laterality: N/A;   Bilateral renal  artery stenoses  04/23/2009   CARDIAC CATHETERIZATION N/A 05/21/2015   Procedure: Left Heart Cath and Coronary Angiography;  Surgeon: Lorretta Harp, MD;  Location: [facility];  Service: Cardiovascular;  Laterality: N/A;   CORONARY ARTERY BYPASS GRAFT N/A 05/22/2015   Procedure: CORONARY ARTERY BYPASS GRAFTING (CABG) LIMA-LAD and SVG-OM EVH RIGHT THIGH GREATER SAPHENOUS VEIN;  Surgeon: [name], MD;  Location: [facility];  Service: Open Heart Surgery;  Laterality: N/A;   HIATAL HERNIA REPAIR     Percutaneous translumnial angioplasty  04/23/2009   Catheterization of [place]efst external iliac artery with Left lower extremity runoff   TEE WITHOUT CARDIOVERSION N/A 05/22/2015   Procedure: TRANSESOPHAGEAL ECHOCARDIOGRAM (TEE);  Surgeon: [name], MD;  Location: [facility];  Service: Open Heart Surgery;  Laterality: N/A;    Current Outpatient Medications  Medication Sig Dispense Refill   amLODipine (NORVASC) 10 MG tablet Take 1 tablet (10 mg total) by mouth daily. 90 tablet 3   Ascorbic Acid (VITAMIN C) 1000 MG tablet Take 1,000 mg  by mouth daily.     aspirin EC 81 MG tablet Take 1 tablet (81 mg total) by mouth daily.     Cholecalciferol 1.25 MG (50000 UT) TABS Take 1 tablet by mouth daily.     fish oil-omega-3 fatty acids 1000 MG capsule Take 1,000 g by mouth daily.      folic acid (FOLVITE) [id number] MCG tablet Take 400 mcg by mouth daily.     Garlic [id number] MG TABS Take 1,250 mg by mouth daily.      isosorbide mononitrate (IMDUR) 60 MG 24 hr tablet Take  1 tablet (60 mg total) by mouth daily. 30 tablet 6   lisinopril (ZESTRIL) 40 MG tablet TAKE 1 TABLET BY MOUTH EVERY DAY 90 tablet 1   metoprolol tartrate (LOPRESSOR) 25 MG tablet TAKE 1 TABLET BY MOUTH TWICE A DAY 180 tablet 1   Misc. Devices MISC by Does not apply route. CPAP     Multiple Vitamin (MULTIVITAMIN) tablet Take 1 tablet by mouth daily.     nitroGLYCERIN (NITROSTAT) 0.4 MG SL tablet Place 1 tablet (0.4 mg total) under the tongue every 5 (five) minutes as needed for chest pain. If pain does not resolve with rest. 50 tablet 3   NP THYROID 60 MG tablet TAKE 1 TABLET BY MOUTH ONCE DAILY BEFORE BREAKFAST 90 tablet 0   olmesartan (BENICAR) 40 MG tablet Take 1 tablet (40 mg total) by mouth daily. 30 tablet 11   rosuvastatin (CRESTOR) 10 MG tablet TAKE 1 TABLET BY MOUTH EVERY DAY N 90 tablet 3   thiamine (VITAMIN B-1) 100 MG tablet Take 100 mg by mouth every evening.      Tiotropium Bromide-Olodaterol (STIOLTO RESPIMAT) 2.5-2.5 MCG/ACT AERS Inhale 2 puffs into the lungs daily. 1 each 11   Tiotropium Bromide-Olodaterol (STIOLTO RESPIMAT) 2.5-2.5 MCG/ACT AERS Inhale 2 puffs into the lungs daily. 4 g 0   vitamin E 400 UNIT capsule Take 400 Units by mouth daily.     No current facility-administered medications for this visit.   Allergies:  Patient has no known allergies.   Social History: The patient  reports that he quit smoking about 29 years ago. His smoking use included cigarettes. He started smoking about 59 years ago. He has a 60.00 pack-year smoking history. He has quit using smokeless tobacco.  His smokeless tobacco use included snuff. He reports that he does not currently use alcohol after a past usage of about 2.0 - 4.0 standard drinks per week. He reports that he does not use drugs.   Family History: The patient's family history includes Cancer in his mother; Deep vein thrombosis in his father; Heart disease in his father; Pulmonary embolism in his father.   ROS: No palpitations or  syncope.  Physical Exam: VS:  BP (!) 138/58   Pulse 62   Ht '5\' 6"'$  (1.676 m)   Wt 164 lb 6.4 oz (74.6 kg)   SpO2 95%   BMI 26.53 kg/m , BMI Body mass index is 26.53 kg/m.  Wt Readings from Last 3 Encounters:  12/04/20 164 lb 6.4 oz (74.6 kg)  10/17/20 166 lb 4.8 oz (75.4 kg)  10/11/20 166 lb (75.3 kg)    General: Bearded elderly male, appears comfortable at rest. HEENT: Conjunctiva and lids normal, wearing a mask. Neck: Supple, no elevated JVP, soft carotid bruits, no thyromegaly. Lungs: Diminished breath sounds without wheezing, nonlabored breathing at rest. Cardiac: Regular rate and rhythm, no S3, 1/6 systolic murmur, no pericardial rub. Abdomen: Soft, nontender, bowel  sounds present, no guarding or rebound. Extremities: No pitting edema, diminished distal pulses in the legs.,  Normal radials. Skin: Warm and dry. Musculoskeletal: No kyphosis. Neuropsychiatric: Alert and oriented x3, affect grossly appropriate.  ECG:  An ECG dated 04/26/2020 was personally reviewed today and demonstrated:  Sinus rhythm with left atrial enlargement, nonspecific ST changes.  Recent Labwork: 07/04/2020: ALT 16; AST 16; BUN 30; Creat 1.06; Hemoglobin 15.1; Platelets 298; Potassium 4.7; Sodium 139 08/28/2020: TSH 0.84     Component Value Date/Time   CHOL 139 07/04/2020 1018   TRIG 128 07/04/2020 1018   HDL 36 (L) 07/04/2020 1018   CHOLHDL 3.9 07/04/2020 1018   VLDL 15 05/19/2015 0224   LDLCALC 81 07/04/2020 1018    Other Studies Reviewed Today:  Echocardiogram 06/22/2019:  1. Left ventricular ejection fraction, by estimation, is 65 to 70%. The  left ventricle has normal function. The left ventricle has no regional  wall motion abnormalities. There is mild left ventricular hypertrophy.  Left ventricular diastolic parameters  are indeterminate.   2. Right ventricular systolic function is normal. The right ventricular  size is normal. There is normal pulmonary artery systolic pressure. The   estimated right ventricular systolic pressure is [id number] mmHg.   3. Left atrial size was upper normal.   4. The mitral valve is grossly normal. Trivial mitral valve  regurgitation.   5. The aortic valve is tricuspid. Aortic valve regurgitation is mild.  Mild to moderate aortic valve sclerosis/calcification is present, without  any evidence of aortic stenosis.   6. The inferior vena cava is normal in size with greater than 50%  respiratory variability, suggesting right atrial pressure of 3 mmHg.   Lexiscan Myoview 05/07/2020: There was no ST segment deviation noted during stress. Findings consistent with prior anteroseptal/septal myocardial infarction with moderate peri-infarct ischemia. This is an intermediate risk study. Mildly elevated TID of 1.31 may suggest a component of balanced ischemia and higher risk. The left ventricular ejection fraction is normal (55-65%).  Chest CT 11/01/2020: IMPRESSION: 1. Mild subpleural reticulation and ground-glass opacity in the periphery of the lung bases, nonspecific. This may be secondary to post infectious/inflammatory scarring, however interstitial lung disease is not excluded. Consider follow-up high-resolution chest CT in 6-12 months to assess for temporal change. 2. Linear and bandlike opacities within the lingula and both lower lobes typical of atelectasis and/or scarring. No evidence of pneumonia or neoplasm. 3. Moderate emphysema. 4. Patulous esophagus with tiny hiatal hernia. 5. Advanced aortic atherosclerosis. 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 Recommend annual imaging followup by CTA or MRA. This recommendation follows 2010 ACCF/AHA/AATS/ACR/ASA/SCA/SCAI/SIR/STS/SVM Guidelines for the Diagnosis and Management of Patients with Thoracic Aortic Disease. Circulation. 2010; [id number]. Aortic aneurysm NOS (ICD10-I71.9) 6. Dense calcification in the proximal left subclavian likely causes hemodynamic significant  stenosis. Post CABG with calcification of native coronary arteries.  Assessment and Plan:  1.  Accelerating angina in a 80 year old male with a history of multivessel CAD status post CABG in 2017 including LIMA to LAD and SVG to OM1.  Follow-up Myoview done back in February of this year was intermediate risk showing anteroseptal scar with moderate peri-infarct ischemia and also increased TID daily ratio suggesting balanced ischemia.  He was managed medically at that time, we are meeting for the first time today.  Per discussion of symptoms and review of his current regimen, plan is to schedule a diagnostic cardiac catheterization to reevaluate both native vessel and bypass anatomy, ideally also assess the  left subclavian based on recent chest CT result.  Risks and benefits discussed, and he is in agreement to proceed.  For now continue present regimen including aspirin, Norvasc, Imdur, Lopressor, Benicar, Crestor, and as needed nitroglycerin.  2.  PAD with bilateral claudication and suboptimal revascularization options.  He is managed medically and followed by Dr. Scot Dock.  3.  COPD, followed by Dr. Melvyn Novas.  4.  Mixed hyperlipidemia, last LDL 81.  He is on Crestor.  Medication Adjustments/Labs and Tests Ordered: Current medicines are reviewed at length with the patient today.  Concerns regarding medicines are outlined above.   Tests Ordered: Orders Placed This Encounter  Procedures   Basic metabolic panel   CBC   EKG 12-Lead     Medication Changes: No orders of the defined types were placed in this encounter.   Disposition:  Follow up  after testing.  Signed, Satira Sark, MD, [facility] 12/04/2020 10:42 AM    [facility] [place], [place], [place] [address] Phone: [phone]; Fax: [phone]

## 2020-12-03 NOTE — H&P (View-Only) (Signed)
Cardiology Office Note  Date: 12/04/2020   ID: [name], DOB Mar 14, 1941, MRN [id number]  PCP:  Ailene Ards, NP  Cardiologist:  Rozann Lesches, MD Electrophysiologist:  None   Chief Complaint  Patient presents with   Cardiac follow-up     History of Present Illness: Serge Bayerl. is a 80 y.o. male former patient of Dr. Bronson Ing now presenting to establish follow-up with me.  I reviewed his records and updated the chart.  He was last seen in February by Mr. Leonides Sake NP.  He reports continued angina symptoms, especially if he speeds up with an activity.  He had to stop going to the [facility] to exercise due to angina just walking in the parking lot.  This is despite continued medical therapy including up titration of Imdur.  Lexiscan Myoview arranged by Mr. Leonides Sake NP back in February was abnormal, overall intermediate risk with evidence of previous anteroseptal infarct and moderate peri-infarct ischemia, also TID ratio of 1.31 suggesting higher risk.  LVEF was normal range.  Follow-up noted with [facility] in July, I reviewed Dr. Nicole Cella note.  He has a history of severe PAD with claudication, managed medically at this time with recent ABIs severely abnormal bilaterally.  Today we went over his medications, he reports compliance.  I talked with him about the risks and benefits of considering a follow-up diagnostic cardiac catheterization to reassess both native vessel and bypass anatomy for revascularization options.  He is in agreement to proceed.  Of note, he did have a chest CT done earlier in August that did show evidence of proximal left subclavian calcification which could potentially also be affecting his LIMA.  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[facility])    Myocardial infarction ([place])    Peripheral arterial disease ([place])    Suboptimal revascularization options    Past Surgical History:  Procedure Laterality Date   ABDOMINAL AORTOGRAM W/LOWER EXTREMITY N/A 08/21/2017   Procedure: ABDOMINAL AORTOGRAM W/LOWER EXTREMITY;  Surgeon: Angelia Mould, MD;  Location: [place] [facility];  Service: Cardiovascular;  Laterality: N/A;   Bilateral renal  artery stenoses  04/23/2009   CARDIAC CATHETERIZATION N/A 05/21/2015   Procedure: Left Heart Cath and Coronary Angiography;  Surgeon: Lorretta Harp, MD;  Location: [place] [facility];  Service: Cardiovascular;  Laterality: N/A;   CORONARY ARTERY BYPASS GRAFT N/A 05/22/2015   Procedure: CORONARY ARTERY BYPASS GRAFTING (CABG) LIMA-LAD and SVG-OM EVH RIGHT THIGH GREATER SAPHENOUS VEIN;  Surgeon: [name], MD;  Location: [place];  Service: Open Heart Surgery;  Laterality: N/A;   HIATAL HERNIA REPAIR     Percutaneous translumnial angioplasty  04/23/2009   Catheterization of [place]efst external iliac artery with Left lower extremity runoff   TEE WITHOUT CARDIOVERSION N/A 05/22/2015   Procedure: TRANSESOPHAGEAL ECHOCARDIOGRAM (TEE);  Surgeon: [name], MD;  Location: [facility];  Service: Open Heart Surgery;  Laterality: N/A;    Current Outpatient Medications  Medication Sig Dispense Refill   amLODipine (NORVASC) 10 MG tablet Take 1 tablet (10 mg total) by mouth daily. 90 tablet 3   Ascorbic Acid (VITAMIN C) 1000 MG tablet Take 1,000 mg  by mouth daily.     aspirin EC 81 MG tablet Take 1 tablet (81 mg total) by mouth daily.     Cholecalciferol 1.25 MG (50000 UT) TABS Take 1 tablet by mouth daily.     fish oil-omega-3 fatty acids 1000 MG capsule Take 1,000 g by mouth daily.      folic acid (FOLVITE) [id number] MCG tablet Take 400 mcg by mouth daily.     Garlic [id number] MG TABS Take 1,250 mg by mouth daily.      isosorbide mononitrate (IMDUR) 60 MG 24 hr tablet Take  1 tablet (60 mg total) by mouth daily. 30 tablet 6   lisinopril (ZESTRIL) 40 MG tablet TAKE 1 TABLET BY MOUTH EVERY DAY 90 tablet 1   metoprolol tartrate (LOPRESSOR) 25 MG tablet TAKE 1 TABLET BY MOUTH TWICE A DAY 180 tablet 1   Misc. Devices MISC by Does not apply route. CPAP     Multiple Vitamin (MULTIVITAMIN) tablet Take 1 tablet by mouth daily.     nitroGLYCERIN (NITROSTAT) 0.4 MG SL tablet Place 1 tablet (0.4 mg total) under the tongue every 5 (five) minutes as needed for chest pain. If pain does not resolve with rest. 50 tablet 3   NP THYROID 60 MG tablet TAKE 1 TABLET BY MOUTH ONCE DAILY BEFORE BREAKFAST 90 tablet 0   olmesartan (BENICAR) 40 MG tablet Take 1 tablet (40 mg total) by mouth daily. 30 tablet 11   rosuvastatin (CRESTOR) 10 MG tablet TAKE 1 TABLET BY MOUTH EVERY DAY N 90 tablet 3   thiamine (VITAMIN B-1) 100 MG tablet Take 100 mg by mouth every evening.      Tiotropium Bromide-Olodaterol (STIOLTO RESPIMAT) 2.5-2.5 MCG/ACT AERS Inhale 2 puffs into the lungs daily. 1 each 11   Tiotropium Bromide-Olodaterol (STIOLTO RESPIMAT) 2.5-2.5 MCG/ACT AERS Inhale 2 puffs into the lungs daily. 4 g 0   vitamin E 400 UNIT capsule Take 400 Units by mouth daily.     No current facility-administered medications for this visit.   Allergies:  Patient has no known allergies.   Social History: The patient  reports that he quit smoking about 29 years ago. His smoking use included cigarettes. He started smoking about 59 years ago. He has a 60.00 pack-year smoking history. He has quit using smokeless tobacco.  His smokeless tobacco use included snuff. He reports that he does not currently use alcohol after a past usage of about 2.0 - 4.0 standard drinks per week. He reports that he does not use drugs.   Family History: The patient's family history includes Cancer in his mother; Deep vein thrombosis in his father; Heart disease in his father; Pulmonary embolism in his father.   ROS: No palpitations or  syncope.  Physical Exam: VS:  BP (!) 138/58   Pulse 62   Ht '5\' 6"'$  (1.676 m)   Wt 164 lb 6.4 oz (74.6 kg)   SpO2 95%   BMI 26.53 kg/m , BMI Body mass index is 26.53 kg/m.  Wt Readings from Last 3 Encounters:  12/04/20 164 lb 6.4 oz (74.6 kg)  10/17/20 166 lb 4.8 oz (75.4 kg)  10/11/20 166 lb (75.3 kg)    General: Bearded elderly male, appears comfortable at rest. HEENT: Conjunctiva and lids normal, wearing a mask. Neck: Supple, no elevated JVP, soft carotid bruits, no thyromegaly. Lungs: Diminished breath sounds without wheezing, nonlabored breathing at rest. Cardiac: Regular rate and rhythm, no S3, 1/6 systolic murmur, no pericardial rub. Abdomen: Soft, nontender, bowel  sounds present, no guarding or rebound. Extremities: No pitting edema, diminished distal pulses in the legs.,  Normal radials. Skin: Warm and dry. Musculoskeletal: No kyphosis. Neuropsychiatric: Alert and oriented x3, affect grossly appropriate.  ECG:  An ECG dated 04/26/2020 was personally reviewed today and demonstrated:  Sinus rhythm with left atrial enlargement, nonspecific ST changes.  Recent Labwork: 07/04/2020: ALT 16; AST 16; BUN 30; Creat 1.06; Hemoglobin 15.1; Platelets 298; Potassium 4.7; Sodium 139 08/28/2020: TSH 0.84     Component Value Date/Time   CHOL 139 07/04/2020 1018   TRIG 128 07/04/2020 1018   HDL 36 (L) 07/04/2020 1018   CHOLHDL 3.9 07/04/2020 1018   VLDL 15 05/19/2015 0224   LDLCALC 81 07/04/2020 1018    Other Studies Reviewed Today:  Echocardiogram 06/22/2019:  1. Left ventricular ejection fraction, by estimation, is 65 to 70%. The  left ventricle has normal function. The left ventricle has no regional  wall motion abnormalities. There is mild left ventricular hypertrophy.  Left ventricular diastolic parameters  are indeterminate.   2. Right ventricular systolic function is normal. The right ventricular  size is normal. There is normal pulmonary artery systolic pressure. The   estimated right ventricular systolic pressure is [id number] mmHg.   3. Left atrial size was upper normal.   4. The mitral valve is grossly normal. Trivial mitral valve  regurgitation.   5. The aortic valve is tricuspid. Aortic valve regurgitation is mild.  Mild to moderate aortic valve sclerosis/calcification is present, without  any evidence of aortic stenosis.   6. The inferior vena cava is normal in size with greater than 50%  respiratory variability, suggesting right atrial pressure of 3 mmHg.   Lexiscan Myoview 05/07/2020: There was no ST segment deviation noted during stress. Findings consistent with prior anteroseptal/septal myocardial infarction with moderate peri-infarct ischemia. This is an intermediate risk study. Mildly elevated TID of 1.31 may suggest a component of balanced ischemia and higher risk. The left ventricular ejection fraction is normal (55-65%).  Chest CT 11/01/2020: IMPRESSION: 1. Mild subpleural reticulation and ground-glass opacity in the periphery of the lung bases, nonspecific. This may be secondary to post infectious/inflammatory scarring, however interstitial lung disease is not excluded. Consider follow-up high-resolution chest CT in 6-12 months to assess for temporal change. 2. Linear and bandlike opacities within the lingula and both lower lobes typical of atelectasis and/or scarring. No evidence of pneumonia or neoplasm. 3. Moderate emphysema. 4. Patulous esophagus with tiny hiatal hernia. 5. Advanced aortic atherosclerosis. 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 Recommend annual imaging followup by CTA or MRA. This recommendation follows 2010 ACCF/AHA/AATS/ACR/ASA/SCA/SCAI/SIR/STS/SVM Guidelines for the Diagnosis and Management of Patients with Thoracic Aortic Disease. Circulation. 2010; [id number]. Aortic aneurysm NOS (ICD10-I71.9) 6. Dense calcification in the proximal left subclavian likely causes hemodynamic significant  stenosis. Post CABG with calcification of native coronary arteries.  Assessment and Plan:  1.  Accelerating angina in a 80 year old male with a history of multivessel CAD status post CABG in 2017 including LIMA to LAD and SVG to OM1.  Follow-up Myoview done back in February of this year was intermediate risk showing anteroseptal scar with moderate peri-infarct ischemia and also increased TID daily ratio suggesting balanced ischemia.  He was managed medically at that time, we are meeting for the first time today.  Per discussion of symptoms and review of his current regimen, plan is to schedule a diagnostic cardiac catheterization to reevaluate both native vessel and bypass anatomy, ideally also assess the  left subclavian based on recent chest CT result.  Risks and benefits discussed, and he is in agreement to proceed.  For now continue present regimen including aspirin, Norvasc, Imdur, Lopressor, Benicar, Crestor, and as needed nitroglycerin.  2.  PAD with bilateral claudication and suboptimal revascularization options.  He is managed medically and followed by Dr. Scot Dock.  3.  COPD, followed by Dr. Melvyn Novas.  4.  Mixed hyperlipidemia, last LDL 81.  He is on Crestor.  Medication Adjustments/Labs and Tests Ordered: Current medicines are reviewed at length with the patient today.  Concerns regarding medicines are outlined above.   Tests Ordered: Orders Placed This Encounter  Procedures   Basic metabolic panel   CBC   EKG 12-Lead     Medication Changes: No orders of the defined types were placed in this encounter.   Disposition:  Follow up  after testing.  Signed, Satira Sark, MD, [facility] 12/04/2020 10:42 AM    [facility] [place], [place], [place] [address] Phone: [phone]; Fax: [phone]

## 2020-12-04 ENCOUNTER — Other Ambulatory Visit: Payer: Self-pay | Admitting: Cardiology

## 2020-12-04 ENCOUNTER — Telehealth: Payer: Self-pay | Admitting: *Deleted

## 2020-12-04 ENCOUNTER — Telehealth: Payer: Self-pay | Admitting: Cardiology

## 2020-12-04 ENCOUNTER — Other Ambulatory Visit: Payer: Self-pay

## 2020-12-04 ENCOUNTER — Ambulatory Visit (INDEPENDENT_AMBULATORY_CARE_PROVIDER_SITE_OTHER): Payer: Medicare Other | Admitting: Cardiology

## 2020-12-04 ENCOUNTER — Encounter: Payer: Self-pay | Admitting: Cardiology

## 2020-12-04 VITALS — BP 138/58 | HR 62 | Ht 66.0 in | Wt 164.4 lb

## 2020-12-04 DIAGNOSIS — E782 Mixed hyperlipidemia: Secondary | ICD-10-CM | POA: Diagnosis not present

## 2020-12-04 DIAGNOSIS — I200 Unstable angina: Secondary | ICD-10-CM

## 2020-12-04 DIAGNOSIS — Z01818 Encounter for other preprocedural examination: Secondary | ICD-10-CM | POA: Diagnosis not present

## 2020-12-04 DIAGNOSIS — Z01810 Encounter for preprocedural cardiovascular examination: Secondary | ICD-10-CM | POA: Diagnosis not present

## 2020-12-04 DIAGNOSIS — I25119 Atherosclerotic heart disease of native coronary artery with unspecified angina pectoris: Secondary | ICD-10-CM

## 2020-12-04 DIAGNOSIS — I739 Peripheral vascular disease, unspecified: Secondary | ICD-10-CM | POA: Diagnosis not present

## 2020-12-04 MED ORDER — SODIUM CHLORIDE 0.9% FLUSH
3.0000 mL | Freq: Two times a day (BID) | INTRAVENOUS | Status: DC
Start: 2020-12-04 — End: 2020-12-07

## 2020-12-04 NOTE — Telephone Encounter (Signed)
Contacted to clarify if he is taking lisinopril. Reports he stopped lisinopril 2 weeks ago.Medication profile updated.   Advised that based on his lab result, he need to hold benicar the day before and day of heart cath. Verbalized understanding

## 2020-12-04 NOTE — Patient Instructions (Signed)
Medication Instructions:  Your physician recommends that you continue on your current medications as directed. Please refer to the Current Medication list given to you today.  Labwork: BMET & CBC today at [place]  Testing/Procedures: Your physician has requested that you have a cardiac catheterization. Cardiac catheterization is used to diagnose and/or treat various heart conditions. Doctors may recommend this procedure for a number of different reasons. The most common reason is to evaluate chest pain. Chest pain can be a symptom of coronary artery disease (CAD), and cardiac catheterization can show whether plaque is narrowing or blocking your heart's arteries. This procedure is also used to evaluate the valves, as well as measure the blood flow and oxygen levels in different parts of your heart. For further information please visit [email or web address]. Please follow instruction sheet, as given.  Follow-Up: Your physician recommends that you schedule a follow-up appointment in: 1 month  Any Other Special Instructions Will Be Listed Below (If Applicable).  If you need a refill on your cardiac medications before your next appointment, please call your pharmacy.   [name] [id number] Dept: [phone] Loc: [phone]  [name].  12/04/2020  You are scheduled for a Cardiac Catheterization on Thursday, September 8 with Dr. [name].  1. Please arrive at the [facility] (Main Entrance A) at [facility]: [address] [name], [place] [address] at 10:00 AM (This time is two hours before your procedure to ensure your preparation). Free valet parking service is available.   Special note: Every effort is made to have your procedure done on time. Please understand that emergencies sometimes delay scheduled procedures.  2. Diet:  Do not eat solid foods after midnight.  The patient may have clear liquids until 5am upon the day of the procedure.  3. Labs: You will need to have blood drawn on Tuesday, September 6 at [facility]. You do not need to be fasting.(BMET & CBC)  4. Medication instructions in preparation for your procedure:   Contrast Allergy: No  On the morning of your procedure, take your Aspirin 81 mg and any morning medicines NOT listed above.  You may use sips of water.  5. Plan for one night stay--bring personal belongings. 6. Bring a current list of your medications and current insurance cards. 7. You MUST have a responsible person to drive you home. 8. Someone MUST be with you the first 24 hours after you arrive home or your discharge will be delayed. 9. Please wear clothes that are easy to get on and off and wear slip-on shoes.  Thank you for allowing [place] to care for you!   -- [facility]

## 2020-12-04 NOTE — Telephone Encounter (Signed)
PERCERT  Left heart cath dx: accelerating angina Thursday, 12/06/20 '@12'$  noon w/Dr. Prescott Gum at 10:00 am

## 2020-12-05 ENCOUNTER — Telehealth: Payer: Self-pay | Admitting: *Deleted

## 2020-12-05 ENCOUNTER — Encounter: Payer: Self-pay | Admitting: *Deleted

## 2020-12-05 NOTE — Telephone Encounter (Signed)
  Pt is returning call, he said to call him back at 4 pm

## 2020-12-05 NOTE — Telephone Encounter (Signed)
Call placed to patient to review procedure instructions, no answer, voicemail-left detailed message (DPR) with procedure instructions at [phone].

## 2020-12-05 NOTE — Telephone Encounter (Addendum)
Cardiac catheterization scheduled at [facility] for: Thursday December 06, 2020 [facility] Main Entrance [facility]) at:10 AM   No solid food after midnight prior to cath, clear liquids until 5 AM day of procedure.  Medication instructions: Hold: -Olmesartan-day before and day of procedure -GFR 54  -Ibuprofen-until post procedure -GFR 54  Except hold medications morning medications can be taken pre-cath with sips of water including aspirin 81 mg.    Confirmed patient has responsible adult to drive home post procedure and be with patient first 24 hours after arriving home.  Patients are allowed one visitor in the waiting room during the time they are at the hospital for their procedure. Both patient and visitor must wear a mask once they enter the hospital.   Patient reports does not currently have any symptoms concerning for COVID-19 and no household members with COVID-19 like illness.   Call placed to patient to review procedure instructions, no answer, left voice mail message to call back.

## 2020-12-06 ENCOUNTER — Other Ambulatory Visit: Payer: Self-pay

## 2020-12-06 ENCOUNTER — Observation Stay (HOSPITAL_COMMUNITY)
Admission: RE | Admit: 2020-12-06 | Discharge: 2020-12-07 | Disposition: A | Discharge status: Home / Self Care | Payer: Medicare Other | Attending: Cardiovascular Disease | Admitting: Cardiovascular Disease

## 2020-12-06 ENCOUNTER — Ambulatory Visit (HOSPITAL_COMMUNITY)
Admission: RE | Disposition: A | Discharge status: Home / Self Care | Payer: Self-pay | Source: Home / Self Care | Attending: Cardiovascular Disease

## 2020-12-06 DIAGNOSIS — Z20822 Contact with and (suspected) exposure to covid-19: Secondary | ICD-10-CM | POA: Diagnosis not present

## 2020-12-06 DIAGNOSIS — E785 Hyperlipidemia, unspecified: Secondary | ICD-10-CM | POA: Diagnosis present

## 2020-12-06 DIAGNOSIS — Z87891 Personal history of nicotine dependence: Secondary | ICD-10-CM | POA: Diagnosis not present

## 2020-12-06 DIAGNOSIS — Z951 Presence of aortocoronary bypass graft: Secondary | ICD-10-CM | POA: Diagnosis not present

## 2020-12-06 DIAGNOSIS — Z9861 Coronary angioplasty status: Secondary | ICD-10-CM

## 2020-12-06 DIAGNOSIS — I200 Unstable angina: Secondary | ICD-10-CM | POA: Diagnosis present

## 2020-12-06 DIAGNOSIS — Z79899 Other long term (current) drug therapy: Secondary | ICD-10-CM | POA: Insufficient documentation

## 2020-12-06 DIAGNOSIS — I739 Peripheral vascular disease, unspecified: Secondary | ICD-10-CM | POA: Diagnosis not present

## 2020-12-06 DIAGNOSIS — I25110 Atherosclerotic heart disease of native coronary artery with unstable angina pectoris: Principal | ICD-10-CM | POA: Insufficient documentation

## 2020-12-06 DIAGNOSIS — I10 Essential (primary) hypertension: Secondary | ICD-10-CM | POA: Diagnosis not present

## 2020-12-06 DIAGNOSIS — J449 Chronic obstructive pulmonary disease, unspecified: Secondary | ICD-10-CM | POA: Diagnosis not present

## 2020-12-06 HISTORY — PX: CORONARY BALLOON ANGIOPLASTY: CATH118233

## 2020-12-06 HISTORY — PX: LEFT HEART CATH AND CORS/GRAFTS ANGIOGRAPHY: CATH118250

## 2020-12-06 LAB — SARS CORONAVIRUS 2 (TAT 6-24 HRS): SARS Coronavirus 2: NEGATIVE

## 2020-12-06 LAB — POCT ACTIVATED CLOTTING TIME: Activated Clotting Time: 277 seconds

## 2020-12-06 SURGERY — LEFT HEART CATH AND CORS/GRAFTS ANGIOGRAPHY
Anesthesia: LOCAL

## 2020-12-06 MED ORDER — LIDOCAINE HCL (PF) 1 % IJ SOLN
INTRAMUSCULAR | Status: AC
  Filled 2020-12-06: qty 30

## 2020-12-06 MED ORDER — SODIUM CHLORIDE 0.9 % WEIGHT BASED INFUSION: 1.0000 mL/kg/h | INTRAVENOUS | Status: DC

## 2020-12-06 MED ORDER — ASPIRIN 81 MG PO CHEW: 81.0000 mg | CHEWABLE_TABLET | ORAL | Status: DC

## 2020-12-06 MED ORDER — HEPARIN SODIUM (PORCINE) 1000 UNIT/ML IJ SOLN
INTRAMUSCULAR | Status: DC | PRN
  Administered 2020-12-06: 10000 [IU] via INTRAVENOUS

## 2020-12-06 MED ORDER — SODIUM CHLORIDE 0.9 % WEIGHT BASED INFUSION
3.0000 mL/kg/h | INTRAVENOUS | Status: DC
  Administered 2020-12-06: 3 mL/kg/h via INTRAVENOUS

## 2020-12-06 MED ORDER — ROSUVASTATIN CALCIUM 10 MG PO TABS
10.0000 mg | ORAL_TABLET | Freq: Every evening | ORAL | Status: DC
  Administered 2020-12-06: 10 mg via ORAL
  Filled 2020-12-06: qty 2
  Filled 2020-12-06 (×3): qty 1

## 2020-12-06 MED ORDER — FENTANYL CITRATE PF 50 MCG/ML IJ SOSY
PREFILLED_SYRINGE | INTRAMUSCULAR | Status: AC
  Filled 2020-12-06: qty 1

## 2020-12-06 MED ORDER — SODIUM CHLORIDE 0.9% FLUSH: 3.0000 mL | Freq: Two times a day (BID) | INTRAVENOUS | Status: DC

## 2020-12-06 MED ORDER — SODIUM CHLORIDE 0.9 % WEIGHT BASED INFUSION: 3.0000 mL/kg/h | INTRAVENOUS | Status: DC

## 2020-12-06 MED ORDER — LABETALOL HCL 5 MG/ML IV SOLN: 10.0000 mg | INTRAVENOUS | Status: AC | PRN

## 2020-12-06 MED ORDER — HEPARIN SODIUM (PORCINE) 1000 UNIT/ML IJ SOLN
INTRAMUSCULAR | Status: AC
  Filled 2020-12-06: qty 1

## 2020-12-06 MED ORDER — HEPARIN (PORCINE) IN NACL 1000-0.9 UT/500ML-% IV SOLN
INTRAVENOUS | Status: DC | PRN
  Administered 2020-12-06 (×2): 500 mL

## 2020-12-06 MED ORDER — HYDRALAZINE HCL 20 MG/ML IJ SOLN: 10.0000 mg | INTRAMUSCULAR | Status: AC | PRN

## 2020-12-06 MED ORDER — SODIUM CHLORIDE 0.9% FLUSH: 3.0000 mL | INTRAVENOUS | Status: DC | PRN

## 2020-12-06 MED ORDER — LIDOCAINE HCL (PF) 1 % IJ SOLN
INTRAMUSCULAR | Status: DC | PRN
  Administered 2020-12-06: 15 mL

## 2020-12-06 MED ORDER — TICAGRELOR 90 MG PO TABS
ORAL_TABLET | ORAL | Status: AC
  Filled 2020-12-06: qty 2

## 2020-12-06 MED ORDER — SODIUM CHLORIDE 0.9 % IV SOLN: 250.0000 mL | INTRAVENOUS | Status: DC | PRN

## 2020-12-06 MED ORDER — TICAGRELOR 90 MG PO TABS
ORAL_TABLET | ORAL | Status: DC | PRN
  Administered 2020-12-06: 180 mg via ORAL

## 2020-12-06 MED ORDER — IRBESARTAN 75 MG PO TABS
75.0000 mg | ORAL_TABLET | Freq: Every day | ORAL | Status: DC
  Filled 2020-12-06: qty 1

## 2020-12-06 MED ORDER — TIROFIBAN HCL IN NACL 5-0.9 MG/100ML-% IV SOLN
INTRAVENOUS | Status: AC
  Filled 2020-12-06: qty 100

## 2020-12-06 MED ORDER — ISOSORBIDE MONONITRATE ER 60 MG PO TB24: 60.0000 mg | ORAL_TABLET | Freq: Every day | ORAL | Status: DC

## 2020-12-06 MED ORDER — NITROGLYCERIN 0.4 MG SL SUBL: 0.4000 mg | SUBLINGUAL_TABLET | SUBLINGUAL | Status: DC | PRN

## 2020-12-06 MED ORDER — ATROPINE SULFATE 1 MG/10ML IJ SOSY
PREFILLED_SYRINGE | INTRAMUSCULAR | Status: AC
  Filled 2020-12-06: qty 10

## 2020-12-06 MED ORDER — ONDANSETRON HCL 4 MG/2ML IJ SOLN
4.0000 mg | Freq: Four times a day (QID) | INTRAMUSCULAR | Status: DC | PRN
  Filled 2020-12-06: qty 2

## 2020-12-06 MED ORDER — AMLODIPINE BESYLATE 5 MG PO TABS
10.0000 mg | ORAL_TABLET | Freq: Every evening | ORAL | Status: DC
  Administered 2020-12-06: 10 mg via ORAL
  Filled 2020-12-06: qty 2

## 2020-12-06 MED ORDER — NITROGLYCERIN 1 MG/10 ML FOR IR/CATH LAB
INTRA_ARTERIAL | Status: AC
  Filled 2020-12-06: qty 10

## 2020-12-06 MED ORDER — THYROID 60 MG PO TABS
60.0000 mg | ORAL_TABLET | Freq: Every day | ORAL | Status: DC
  Administered 2020-12-07: 60 mg via ORAL
  Filled 2020-12-06: qty 1

## 2020-12-06 MED ORDER — ACETAMINOPHEN 325 MG PO TABS: 650.0000 mg | ORAL_TABLET | ORAL | Status: DC | PRN

## 2020-12-06 MED ORDER — VERAPAMIL HCL 2.5 MG/ML IV SOLN
INTRAVENOUS | Status: AC
  Filled 2020-12-06: qty 2

## 2020-12-06 MED ORDER — FENTANYL CITRATE (PF) 100 MCG/2ML IJ SOLN
INTRAMUSCULAR | Status: DC | PRN
  Administered 2020-12-06: 25 ug via INTRAVENOUS

## 2020-12-06 MED ORDER — IOHEXOL 350 MG/ML SOLN
INTRAVENOUS | Status: DC | PRN
  Administered 2020-12-06: 125 mL

## 2020-12-06 MED ORDER — SODIUM CHLORIDE 0.9% FLUSH
3.0000 mL | Freq: Two times a day (BID) | INTRAVENOUS | Status: DC
  Administered 2020-12-07: 3 mL via INTRAVENOUS

## 2020-12-06 MED ORDER — THIAMINE HCL 100 MG PO TABS
100.0000 mg | ORAL_TABLET | Freq: Every evening | ORAL | Status: DC
  Administered 2020-12-06: 100 mg via ORAL
  Filled 2020-12-06: qty 1

## 2020-12-06 MED ORDER — MIDAZOLAM HCL 2 MG/2ML IJ SOLN
INTRAMUSCULAR | Status: AC
  Filled 2020-12-06: qty 2

## 2020-12-06 MED ORDER — TIROFIBAN HCL IN NACL 5-0.9 MG/100ML-% IV SOLN
INTRAVENOUS | Status: AC | PRN
  Administered 2020-12-06: .075 ug/kg/min via INTRAVENOUS

## 2020-12-06 MED ORDER — TICAGRELOR 90 MG PO TABS
90.0000 mg | ORAL_TABLET | Freq: Two times a day (BID) | ORAL | Status: DC
  Administered 2020-12-06 – 2020-12-07 (×2): 90 mg via ORAL
  Filled 2020-12-06 (×2): qty 1

## 2020-12-06 MED ORDER — MIDAZOLAM HCL 2 MG/2ML IJ SOLN
INTRAMUSCULAR | Status: DC | PRN
  Administered 2020-12-06: 1 mg via INTRAVENOUS

## 2020-12-06 MED ORDER — FENTANYL CITRATE (PF) 100 MCG/2ML IJ SOLN
INTRAMUSCULAR | Status: AC
  Filled 2020-12-06: qty 2

## 2020-12-06 MED ORDER — ASPIRIN EC 81 MG PO TBEC
81.0000 mg | DELAYED_RELEASE_TABLET | Freq: Every day | ORAL | Status: DC
  Administered 2020-12-07: 81 mg via ORAL
  Filled 2020-12-06: qty 1

## 2020-12-06 MED ORDER — FENTANYL CITRATE PF 50 MCG/ML IJ SOSY
50.0000 ug | PREFILLED_SYRINGE | Freq: Once | INTRAMUSCULAR | Status: DC
Start: 2020-12-06 — End: 2020-12-07

## 2020-12-06 MED ORDER — METOPROLOL TARTRATE 12.5 MG HALF TABLET
25.0000 mg | ORAL_TABLET | Freq: Two times a day (BID) | ORAL | Status: DC
  Administered 2020-12-06: 25 mg via ORAL
  Filled 2020-12-06: qty 2

## 2020-12-06 MED ORDER — SODIUM CHLORIDE 0.9 % IV SOLN: INTRAVENOUS | Status: AC

## 2020-12-06 MED ORDER — TIROFIBAN (AGGRASTAT) BOLUS VIA INFUSION
INTRAVENOUS | Status: DC | PRN
  Administered 2020-12-06: 1860 ug via INTRAVENOUS

## 2020-12-06 SURGICAL SUPPLY — 23 items
BALLN SAPPHIRE 2.0X15 (BALLOONS) ×2
BALLN SAPPHIRE 2.5X15 (BALLOONS) ×2
BALLN SCOREFLEX 3.0X15 (BALLOONS) ×2
BALLOON SAPPHIRE 2.0X15 (BALLOONS) IMPLANT
BALLOON SAPPHIRE 2.5X15 (BALLOONS) IMPLANT
BALLOON SCOREFLEX 3.0X15 (BALLOONS) IMPLANT
CATH INFINITI 5FR MULTPACK ANG (CATHETERS) ×1 IMPLANT
CATH VISTA GUIDE 6FR JR4 (CATHETERS) ×1 IMPLANT
GUIDELINER 6F (CATHETERS) ×1 IMPLANT
KIT ENCORE 26 ADVANTAGE (KITS) ×1 IMPLANT
KIT HEART LEFT (KITS) ×3 IMPLANT
PACK CARDIAC CATHETERIZATION (CUSTOM PROCEDURE TRAY) ×3 IMPLANT
PINNACLE LONG 5F 25CM (SHEATH) ×2
PINNACLE LONG 6F 25CM (SHEATH) ×2
SHEATH INTRO PINNACLE 6F 25CM (SHEATH) IMPLANT
SHEATH INTROD PINNACLE 5F 25CM (SHEATH) IMPLANT
SHEATH PINNACLE 5F 10CM (SHEATH) ×1 IMPLANT
SYR MEDRAD MARK 7 150ML (SYRINGE) ×3 IMPLANT
TRANSDUCER W/STOPCOCK (MISCELLANEOUS) ×3 IMPLANT
TUBING CIL FLEX 10 FLL-RA (TUBING) ×3 IMPLANT
TUBING CONTRAST HIGH PRESS 20 (MISCELLANEOUS) ×1 IMPLANT
WIRE COUGAR XT STRL 190CM (WIRE) ×1 IMPLANT
WIRE EMERALD 3MM-J .035X260CM (WIRE) ×1 IMPLANT

## 2020-12-06 NOTE — Interval H&P Note (Signed)
History and Physical Interval Note:  12/06/2020 10:48 AM  [name].  has presented today for surgery, with the diagnosis of accelerating angina.  The various methods of treatment have been discussed with the patient and family. After consideration of risks, benefits and other options for treatment, the patient has consented to  Procedure(s): LEFT HEART CATH AND CORS/GRAFTS ANGIOGRAPHY (N/A) as a surgical intervention.  The patient's history has been reviewed, patient examined, no change in status, stable for surgery.  I have reviewed the patient's chart and labs.  Questions were answered to the patient's satisfaction.    Cath Lab Visit (complete for each Cath Lab visit)  Clinical Evaluation Leading to the Procedure:   ACS: No.  Non-ACS:    Anginal Classification: CCS III  Anti-ischemic medical therapy: Maximal Therapy (2 or more classes of medications)  Non-Invasive Test Results: No non-invasive testing performed  Prior CABG: Previous CABG        [name]

## 2020-12-06 NOTE — Progress Notes (Signed)
Applied 5 minutes of pressure to groin site due to hematoma. Will continue to monitor. BP 155/67 heart rate 82.

## 2020-12-06 NOTE — Progress Notes (Signed)
Site area: Right groin a 6 french long sheath was removed from right groin  Site Prior to Removal:  Level 1- Bruise  Pressure Applied For 45 MINUTES    Bedrest Beginning at 1930 X 4 hours  Manual:   Yes.    Patient Status During Pull:  stable  Pt Bolus NS 600cc for hypotension  Post Pull Groin Site:  Level 1- bruise  Post Pull Instructions Given:  Yes.    Post Pull Pulses Present:  Yes.  !+ PT  Dressing applied: Yes Pressure dressing applied  Comment:  RN in to check site.

## 2020-12-06 NOTE — Progress Notes (Signed)
Cath lab had to come to bedside on multiple occasions due to a hematoma forming around femoral sheath. Around 1730 pt started to experience some abdominal tenderness and blood pressure began to decrease. Cath lab contacted. Cath lab to bedside.

## 2020-12-07 ENCOUNTER — Other Ambulatory Visit (HOSPITAL_COMMUNITY): Payer: Self-pay

## 2020-12-07 ENCOUNTER — Encounter (HOSPITAL_COMMUNITY): Payer: Self-pay | Admitting: Cardiovascular Disease

## 2020-12-07 DIAGNOSIS — I739 Peripheral vascular disease, unspecified: Secondary | ICD-10-CM

## 2020-12-07 DIAGNOSIS — Z951 Presence of aortocoronary bypass graft: Secondary | ICD-10-CM | POA: Diagnosis not present

## 2020-12-07 DIAGNOSIS — I10 Essential (primary) hypertension: Secondary | ICD-10-CM | POA: Diagnosis not present

## 2020-12-07 DIAGNOSIS — I25110 Atherosclerotic heart disease of native coronary artery with unstable angina pectoris: Secondary | ICD-10-CM | POA: Diagnosis not present

## 2020-12-07 DIAGNOSIS — I200 Unstable angina: Secondary | ICD-10-CM | POA: Diagnosis not present

## 2020-12-07 DIAGNOSIS — Z20822 Contact with and (suspected) exposure to covid-19: Secondary | ICD-10-CM | POA: Diagnosis not present

## 2020-12-07 DIAGNOSIS — J449 Chronic obstructive pulmonary disease, unspecified: Secondary | ICD-10-CM | POA: Diagnosis not present

## 2020-12-07 DIAGNOSIS — Z79899 Other long term (current) drug therapy: Secondary | ICD-10-CM | POA: Diagnosis not present

## 2020-12-07 DIAGNOSIS — Z87891 Personal history of nicotine dependence: Secondary | ICD-10-CM | POA: Diagnosis not present

## 2020-12-07 LAB — CBC
HCT: 40.7 % (ref 39.0–52.0)
Hemoglobin: 13.8 g/dL (ref 13.0–17.0)
MCH: 31.4 pg (ref 26.0–34.0)
MCHC: 33.9 g/dL (ref 30.0–36.0)
MCV: 92.5 fL (ref 80.0–100.0)
Platelets: 244 10*3/uL (ref 150–400)
RBC: 4.4 MIL/uL (ref 4.22–5.81)
RDW: 13.7 % (ref 11.5–15.5)
WBC: 8.3 10*3/uL (ref 4.0–10.5)
nRBC: 0 % (ref 0.0–0.2)

## 2020-12-07 LAB — BASIC METABOLIC PANEL
Anion gap: 8 (ref 5–15)
BUN: 28 mg/dL — ABNORMAL HIGH (ref 8–23)
CO2: 20 mmol/L — ABNORMAL LOW (ref 22–32)
Calcium: 9.1 mg/dL (ref 8.9–10.3)
Chloride: 111 mmol/L (ref 98–111)
Creatinine, Ser: 1.29 mg/dL — ABNORMAL HIGH (ref 0.61–1.24)
GFR, Estimated: 56 mL/min — ABNORMAL LOW (ref >60)
Glucose, Bld: 96 mg/dL (ref 70–99)
Potassium: 5 mmol/L (ref 3.5–5.1)
Sodium: 139 mmol/L (ref 135–145)

## 2020-12-07 LAB — POCT ACTIVATED CLOTTING TIME
Activated Clotting Time: 295 seconds
Activated Clotting Time: 370 seconds
Activated Clotting Time: 318 seconds
Activated Clotting Time: 202 seconds
Activated Clotting Time: 184 seconds

## 2020-12-07 MED ORDER — METOPROLOL TARTRATE 50 MG PO TABS
50.0000 mg | ORAL_TABLET | Freq: Two times a day (BID) | ORAL | Status: DC
  Administered 2020-12-07: 50 mg via ORAL
  Filled 2020-12-07: qty 1

## 2020-12-07 MED ORDER — TICAGRELOR 90 MG PO TABS
90.0000 mg | ORAL_TABLET | Freq: Two times a day (BID) | ORAL | 11 refills | Status: DC
  Filled 2020-12-07: qty 60, 30d supply, fill #0

## 2020-12-07 MED ORDER — METOPROLOL TARTRATE 50 MG PO TABS
50.0000 mg | ORAL_TABLET | Freq: Two times a day (BID) | ORAL | 11 refills | Status: DC
  Filled 2020-12-07: qty 60, 30d supply, fill #0

## 2020-12-07 MED FILL — Nitroglycerin IV Soln 100 MCG/ML in D5W: INTRA_ARTERIAL | Qty: 10 | Status: AC

## 2020-12-07 NOTE — TOC Benefit Eligibility Note (Signed)
Patient Teacher, English as a foreign language completed.    The patient is currently admitted and upon discharge could be taking Brilinta 90 mg.  The current 30 day co-pay is, $231.01.   The patient is insured through [place], [place] Patient Advocate Specialist [employer] Team Direct Number: [phone]  Fax: [phone]

## 2020-12-07 NOTE — Discharge Summary (Signed)
Discharge Summary    Patient ID: William [name]. MRN: [id number]; DOB: Jul 29, 1940  Admit date: 12/06/2020 Discharge date: 12/07/2020  PCP:  Ailene Ards, NP   [facility] Providers Cardiologist:  Rozann Lesches, MD   Discharge Diagnoses    Principal Problem:   Unstable angina [facility]) Active Problems:   Hyperlipidemia   Essential hypertension   Peripheral vascular disease, unspecified ([place])   Accelerating angina [facility])    Diagnostic Studies/Procedures    Cath: 12/06/20  Dist RCA lesion is 70% stenosed.   Ost LM to Mid LM lesion is 99% stenosed.   Ost Cx to Prox Cx lesion is 60% stenosed.   Origin to Prox Graft lesion is 100% stenosed.   Prox LAD lesion is 100% stenosed.   Mid RCA-1 lesion is 99% stenosed.   Mid RCA-2 lesion is 99% stenosed.   Scoring balloon angioplasty was performed using a BALLOON SCOREFLEX 3.0X15.   Post intervention, there is a 80% residual stenosis.   Post intervention, there is a 30% residual stenosis.   SVG graft was visualized by angiography.   LIMA graft was not visualized.   The graft exhibits no disease.   The left ventricular systolic function is normal.   LV end diastolic pressure is normal.   The left ventricular ejection fraction is 55-65% by visual estimate.   There is no mitral valve regurgitation.   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 The distal LAD is seen to fill from right to left collaterals. 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 It is presumed that there is not adequate flow into the LIMA graft given the CTO of the left subclavian artery.  The proximal Circumflex has a moderate stenosis. The vein graft to the obtuse marginal branch is patent.  The RCA is a large dominant artery with serial severe, calcified mid and distal lesions.   Balloon angioplasty and scoring balloon angioplasty of the mid RCA lesions. Unable to deliver stents to the mid RCA lesions despite adequate standard balloon and scoring balloon expansion.  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 Scot Dock. This supplies the LIMA graft which I could not visualize today due to the occlusion of the subclavian artery. I suspect that his RCA disease in the culprit for his recent angina. Attempted PCI today but will need to bring him back in 2 weeks for orbital atherectomy and stenting of the RCA. I have set this up for 12/21/20 at 7:30 am. I have spoken to the [employer] rep who can be here that day. He will continue on ASA and Brilinta. Monitor overnight on telemetry. Plan d/c home tomorrow on ASA/Brilinta if stable.    Diagnostic Dominance: Right Intervention   _____________   History of Present Illness     William Makely. is a 81 y.o. male with PMH of CAD s/p CABG (LIMA-LAD, SVG to OM1), COPD, HTN, HLD, PAD who presented to the office on 9/6 for follow up.      He reports continued angina symptoms, especially if he speeds up with an activity.  He had to stop going to the [facility] to exercise due to angina just walking in the parking  lot.  This is despite continued medical therapy including up titration of Imdur.   Lexiscan Myoview arranged by Mr. Leonides Sake NP back in February was abnormal, overall intermediate risk with evidence of previous anteroseptal infarct and moderate peri-infarct ischemia, also TID ratio of 1.31 suggesting higher risk.  LVEF was normal range.   Follow-up noted with [facility] in July. He has a history of severe PAD with claudication, managed medically at this time with recent ABIs severely abnormal bilaterally.   In the office Dr. Domenic Polite discussed with him about the risks and benefits of considering a follow-up  diagnostic cardiac catheterization to reassess both native vessel and bypass anatomy for revascularization options.  He was in agreement to proceed.  Of note, he did have a chest CT done earlier in August that did show evidence of proximal left subclavian calcification which could potentially also be affecting his LIMA.  Hospital Course     CAD s/p CABG (LIMA-LAD, SVG-OM1 '17): underwent cardiac cath noted above with severe complex 3v CAD. Unable to visualize LIMA- LAD 2/2 occlusion at ostium of left subclavian artery. Presumed there is not adequate flow to the LIMA with CTO of left subclavian artery. Attempted balloon angioplasty of the mRCA lesions but unable to deliver stents. Placed on DAPT with ASA/Brilinta with plans to bring back on 9/23 for orbital atherectomy and stenting of the RCA. No chest pain overnight. Seen by CR -- continue ASA, Brilinta, crestor '10mg'$  daily, Imdur '60mg'$  daily, metoprolol increased from '25mg'$  BID to '50mg'$  BID, amlodipine '10mg'$  daily and olmesartan '40mg'$  daily  Left groin hematoma: developed post sheath pull. Site is stable without firm hematoma but significant ecchymosis. Hgb 13.8  PAD: hx of bilateral claudication. Follows with Dr. Scot Dock  HLD: recently started on Crestor '10mg'$  daily. LDL 81. Reports intolerance with higher doses -- referral to lipid clinic at discharge   HTN: increased metoprolol to '50mg'$  BID, continued on ARB and amlodipine  General: Well developed, well nourished, male appearing in no acute distress. Head: Normocephalic, atraumatic.  Neck: Supple without bruits, JVD. Lungs:  Resp regular and unlabored, CTA. Heart: RRR, S1, S2, no S3, S4, or murmur; no rub. Abdomen: Soft, non-tender, non-distended with normoactive bowel sounds. Extremities: No clubbing, cyanosis, edema. Distal pedal pulses are 2+ bilaterally. Left femoral cath site stable with significant bruising but no hematoma. Neuro: Alert and oriented X 3. Moves all extremities  spontaneously. Psych: Normal affect.  Patient was seen by Dr. Martinique and deemed stable for discharge home. Planned for repeat cardiac cath in 2 weeks. Medications sent to [place]. Educated by PharmD prior to discharge.   Did the patient have an acute coronary syndrome (MI, NSTEMI, STEMI, etc) this admission?:  No                               Did the patient have a percutaneous coronary intervention (stent / angioplasty)?:  Yes.     Cath/PCI Registry Performance & Quality Measures: Aspirin prescribed? - Yes ADP Receptor Inhibitor (Plavix/Clopidogrel, Brilinta/Ticagrelor or Effient/Prasugrel) prescribed (includes medically managed patients)? - Yes High Intensity Statin (Lipitor 40-'80mg'$  or Crestor 20-'40mg'$ ) prescribed? - No - intolerant to higher doses For EF <40%, was ACEI/ARB prescribed? - Yes For EF <40%, Aldosterone Antagonist (Spironolactone or Eplerenone) prescribed? - Not Applicable (EF >/= [id number]) Cardiac Rehab Phase II ordered? - Yes      _____________  Discharge Vitals Blood pressure (!) 113/50, pulse 95, temperature 97.7 F (36.5  C), temperature source Oral, resp. rate 19, height '5\' 7"'$  (1.702 m), weight 74.4 kg, SpO2 95 %.  Filed Weights   12/06/20 0945  Weight: 74.4 kg    Labs & Radiologic Studies    CBC Recent Labs    12/07/20 0255  WBC 8.3  HGB 13.8  HCT 40.7  MCV 92.5  PLT [id number]   Basic Metabolic Panel Recent Labs    12/07/20 0255  NA 139  K 5.0  CL 111  CO2 20*  GLUCOSE 96  BUN 28*  CREATININE 1.29*  CALCIUM 9.1   Liver Function Tests No results for input(s): AST, ALT, ALKPHOS, BILITOT, PROT, ALBUMIN in the last 72 hours. No results for input(s): LIPASE, AMYLASE in the last 72 hours. High Sensitivity Troponin:   No results for input(s): TROPONINIHS in the last 720 hours.  BNP Invalid input(s): POCBNP D-Dimer No results for input(s): DDIMER in the last 72 hours. Hemoglobin A1C No results for input(s): HGBA1C in the last 72 hours. Fasting  Lipid Panel No results for input(s): CHOL, HDL, LDLCALC, TRIG, CHOLHDL, LDLDIRECT in the last 72 hours. Thyroid Function Tests No results for input(s): TSH, T4TOTAL, T3FREE, THYROIDAB in the last 72 hours.  Invalid input(s): FREET3 _____________  CARDIAC CATHETERIZATION  Addendum Date: 12/06/2020     Dist RCA lesion is 70% stenosed.   Ost LM to Mid LM lesion is 99% stenosed.   Ost Cx to Prox Cx lesion is 60% stenosed.   Origin to Prox Graft lesion is 100% stenosed.   Prox LAD lesion is 100% stenosed.   Mid RCA-1 lesion is 99% stenosed.   Mid RCA-2 lesion is 99% stenosed.   Scoring balloon angioplasty was performed using a BALLOON SCOREFLEX 3.0X15.   Post intervention, there is a 80% residual stenosis.   Post intervention, there is a 30% residual stenosis.   SVG graft was visualized by angiography.   LIMA graft was not visualized.   The graft exhibits no disease.   The left ventricular systolic function is normal.   LV end diastolic pressure is normal.   The left ventricular ejection fraction is 55-65% by visual estimate.   There is no mitral valve regurgitation. 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 The distal LAD is seen to fill from right to left collaterals. 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 It is presumed that there is not adequate flow into the LIMA graft given the CTO of the left subclavian artery. The proximal Circumflex has a moderate stenosis. The vein graft to the obtuse marginal branch is patent. The RCA is a large dominant artery with serial severe, calcified mid and distal lesions. Balloon angioplasty and scoring balloon angioplasty of the mid RCA lesions. Unable to deliver stents to the mid RCA lesions despite adequate standard balloon and scoring balloon expansion. 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 Scot Dock. This supplies the LIMA graft which I could not visualize today due to the occlusion of the subclavian artery. I suspect that his RCA disease in the culprit for his recent angina. Attempted PCI today but will need to bring him back in 2 weeks for orbital atherectomy and stenting of the RCA. I have  set this up for 12/21/20 at 7:30 am. I have spoken to the [employer] rep who can be here that day. He will continue on ASA and Brilinta. Monitor overnight on telemetry. Plan d/c home tomorrow on ASA/Brilinta if stable.   Result Date: 12/06/2020   Dist RCA lesion is 70% stenosed.   Ost LM to Mid LM lesion is 99% stenosed.   Ost Cx to Prox Cx lesion is 60% stenosed.   Origin to Prox Graft lesion is 100% stenosed.   Prox LAD lesion is 100% stenosed.   Mid RCA-1 lesion is 99% stenosed.   Mid RCA-2 lesion is 99% stenosed.   Scoring balloon angioplasty was performed using a BALLOON SCOREFLEX 3.0X15.   Post intervention, there is a 80% residual stenosis.   Post intervention, there is a 30% residual stenosis.   SVG graft was visualized by angiography.   LIMA graft was not visualized.   The graft exhibits no disease.   The left ventricular systolic function is normal.   LV end diastolic pressure is normal.   The left ventricular ejection fraction is 55-65% by visual estimate.   There is no mitral valve regurgitation. 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 The distal LAD is seen to fill from right to left collaterals. 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 It is presumed that there is not adequate flow into the LIMA graft given the CTO of the left subclavian artery. The proximal Circumflex  has a moderate stenosis. The vein graft to the obtuse marginal branch is patent. The RCA is a large dominant artery with serial severe, calcified mid and distal lesions. Balloon angioplasty and scoring balloon angioplasty of the mid RCA lesions. Unable to deliver stents to the mid RCA lesions despite adequate standard balloon and scoring balloon expansion. 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 Scot Dock. This supplies the LIMA graft which I could not visualize today due to the occlusion of the subclavian artery. I suspect that his RCA disease in the culprit for his recent angina. Attempted PCI today but will need to bring him back in 2 weeks for orbital atherectomy and stenting of the RCA. I have set this up for 12/21/20 at 7:30 am. I have spoken to the [employer] rep who can be here that day. He will continue on ASA and Brilinta. Monitor overnight on telemetry. Plan d/c home tomorrow on ASA/Brilinta if stable.   Disposition   Pt is being discharged home today in good condition.  Follow-up Plans & Appointments     Follow-up Information     Verta Ellen., NP Follow up on 12/13/2020.   Specialty: Cardiology Why: at 3pm for your follow up appt Contact information: [address] [place] [address] [phone]                Discharge Instructions     AMB Referral to [facility] Pharm[facility]   Complete by: As directed    Reason For Referral: Lipids   Amb Referral to Cardiac Rehabilitation   Complete by: As directed    To return for staged PCI   Diagnosis: PTCA   After initial evaluation and assessments completed: Virtual Based  Care may be provided alone or in conjunction with Phase 2 Cardiac Rehab based on patient barriers.: Yes   Call MD for:  difficulty breathing, headache or visual disturbances   Complete by: As directed    Call MD for:   persistant dizziness or light-headedness   Complete by: As directed    Call MD for:  redness, tenderness, or signs of infection (pain, swelling, redness, odor or green/yellow discharge around incision site)   Complete by: As directed    Diet - low sodium heart healthy   Complete by: As directed    Discharge instructions   Complete by: As directed    Groin Site Care Refer to this sheet in the next few weeks. These instructions provide you with information on caring for yourself after your procedure. Your caregiver may also give you more specific instructions. Your treatment has been planned according to current medical practices, but problems sometimes occur. Call your caregiver if you have any problems or questions after your procedure. HOME CARE INSTRUCTIONS You may shower 24 hours after the procedure. Remove the bandage (dressing) and gently wash the site with plain soap and water. Gently pat the site dry.  Do not apply powder or lotion to the site.  Do not sit in a bathtub, swimming pool, or whirlpool for 5 to 7 days.  No bending, squatting, or lifting anything over 10 pounds (4.5 kg) as directed by your caregiver.  Inspect the site at least twice daily.  Do not drive home if you are discharged the same day of the procedure. Have someone else drive you.  You may drive 24 hours after the procedure unless otherwise instructed by your caregiver.  What to expect: Any bruising will usually fade within 1 to 2 weeks.  Blood that collects in the tissue (hematoma) may be painful to the touch. It should usually decrease in size and tenderness within 1 to 2 weeks.  SEEK IMMEDIATE MEDICAL CARE IF: You have unusual pain at the groin site or down the affected leg.  You have redness, warmth, swelling, or pain at the groin site.  You have drainage (other than a small amount of blood on the dressing).  You have chills.  You have a fever or persistent symptoms for more than 72 hours.  You have a fever  and your symptoms suddenly get worse.  Your leg becomes pale, cool, tingly, or numb.  You have heavy bleeding from the site. Hold pressure on the site. Marland Kitchen  PLEASE DO NOT MISS ANY DOSES OF YOUR BRILINTA!!!!! Also keep a log of you blood pressures and bring back to your follow up appt. Please call the office with any questions.   Patients taking blood thinners should generally stay away from medicines like ibuprofen, Advil, Motrin, naproxen, and Aleve due to risk of stomach bleeding. You may take Tylenol as directed or talk to your primary doctor about alternatives.   PLEASE ENSURE THAT YOU DO NOT RUN OUT OF YOUR BRILINTA. This medication is very important to remain on for at least one year. IF you have issues obtaining this medication due to cost please CALL the office 3-5 business days prior to running out in order to prevent missing doses of this medication.   Increase activity slowly   Complete by: As directed        Discharge Medications   Allergies as of 12/07/2020   No Known Allergies      Medication List     TAKE these medications  amLODipine 10 MG tablet Commonly known as: NORVASC Take 1 tablet (10 mg total) by mouth daily. What changed: when to take this   aspirin EC 81 MG tablet Take 1 tablet (81 mg total) by mouth daily.   Brilinta 90 MG Tabs tablet Generic drug: ticagrelor Take 1 tablet (90 mg total) by mouth 2 (two) times daily.   cholecalciferol 25 MCG (1000 UNIT) tablet Commonly known as: VITAMIN D Take 1,000 Units by mouth in the morning.   fish oil-omega-3 fatty acids 1000 MG capsule Take 1,000 mg by mouth in the morning.   folic acid [id number] MCG tablet Commonly known as: FOLVITE Take 400 mcg by mouth in the morning.   Garlic [id number] MG Tbec Take 2,000 mg by mouth in the morning.   ibuprofen 200 MG tablet Commonly known as: ADVIL Take 200 mg by mouth every 8 (eight) hours as needed (pain).   isosorbide mononitrate 60 MG 24 hr tablet Commonly known  as: IMDUR Take 1 tablet (60 mg total) by mouth daily. What changed: when to take this   metoprolol tartrate 50 MG tablet Commonly known as: LOPRESSOR Take 1 tablet (50 mg total) by mouth 2 (two) times daily. What changed:  medication strength how much to take   Misc. Devices Misc by Does not apply route. CPAP   multivitamin with minerals Tabs tablet Take 1 tablet by mouth in the morning.   nitroGLYCERIN 0.4 MG SL tablet Commonly known as: NITROSTAT Place 1 tablet (0.4 mg total) under the tongue every 5 (five) minutes as needed for chest pain. If pain does not resolve with rest.   NP Thyroid 60 MG tablet Generic drug: thyroid TAKE 1 TABLET BY MOUTH ONCE DAILY BEFORE BREAKFAST   olmesartan 40 MG tablet Commonly known as: Benicar Take 1 tablet (40 mg total) by mouth daily. What changed: when to take this   rosuvastatin 10 MG tablet Commonly known as: CRESTOR TAKE 1 TABLET BY MOUTH EVERY DAY N What changed:  how much to take how to take this when to take this additional instructions   Stiolto Respimat 2.5-2.5 MCG/ACT Aers Generic drug: Tiotropium Bromide-Olodaterol Inhale 2 puffs into the lungs daily.   thiamine 100 MG tablet Commonly known as: Vitamin B-1 Take 100 mg by mouth every evening.   vitamin C 1000 MG tablet Take 1,000 mg by mouth in the morning.        Outstanding Labs/Studies   N/a   Duration of Discharge Encounter   Greater than 30 minutes including physician time.  Signed, Reino Bellis, NP 12/07/2020, 11:18 AM

## 2020-12-07 NOTE — Plan of Care (Signed)
  Problem: Education: Goal: Knowledge of General Education information will improve Description: Including pain rating scale, medication(s)/side effects and non-pharmacologic comfort measures Outcome: Progressing   Problem: Activity: Goal: Ability to return to baseline activity level will improve Outcome: Progressing   Problem: Cardiovascular: Goal: Ability to achieve and maintain adequate cardiovascular perfusion will improve Outcome: Progressing

## 2020-12-07 NOTE — Progress Notes (Signed)
CARDIAC REHAB PHASE I   PRE:  Rate/Rhythm: 90 SR  BP:  Supine: 132/51  Sitting:   Standing:    SaO2: 96%RA  MODE:  Ambulation: 300 ft   POST:  Rate/Rhythm: 120 ST  BP:  Supine:   Sitting: 162/50  Standing:    SaO2: 96%RA 0830-0940 Had NP check groin prior to walk. Pt walked 300 ft on RA with steady gait. Stopped twice to rest his leg. No c/o CP. To recliner with call bell. Reviewed with pt the importance of brilinta with stent. Reviewed NTG use, gave heart healthy diet and discussed CRP 2. Pt has attended [facility] CRP 2 before and enjoyed it. Hopeful to return after staged PCI. Referral sent. Pt has new cell number at [phone].  Pt knows to take it easy until he returns. Pt has really been working on his diet and trying to do the right things. Son in at end of visit and brief review given.    Graylon Good, RN BSN  12/07/2020 9:37 AM

## 2020-12-10 ENCOUNTER — Telehealth: Payer: Self-pay | Admitting: Cardiology

## 2020-12-10 NOTE — Telephone Encounter (Signed)
That is fine, pt has been scheduled for telephone lipid visit this Thursday.

## 2020-12-10 NOTE — Telephone Encounter (Signed)
Dr. Angelena Form sent a referral to for pt to schedule with PharmD for hyperlipidemia. Patient lives in [place] and wants to know if this can be done over the phone. Please advise.

## 2020-12-11 ENCOUNTER — Other Ambulatory Visit: Payer: Self-pay

## 2020-12-11 ENCOUNTER — Encounter: Payer: Self-pay | Admitting: Family Medicine

## 2020-12-11 ENCOUNTER — Telehealth: Payer: Self-pay | Admitting: Cardiology

## 2020-12-11 ENCOUNTER — Ambulatory Visit (INDEPENDENT_AMBULATORY_CARE_PROVIDER_SITE_OTHER): Payer: Medicare Other | Admitting: Family Medicine

## 2020-12-11 VITALS — BP 166/61 | HR 73 | Ht 67.0 in | Wt 164.0 lb

## 2020-12-11 DIAGNOSIS — G4733 Obstructive sleep apnea (adult) (pediatric): Secondary | ICD-10-CM

## 2020-12-11 DIAGNOSIS — Z9989 Dependence on other enabling machines and devices: Secondary | ICD-10-CM | POA: Diagnosis not present

## 2020-12-11 NOTE — Telephone Encounter (Signed)
Informed daughter that this could be discussed in detail at his follow up with Katina Dung, NP on 12/13/2020.  Informed her to come with him to that visit to ask any questions she may have.  She verbalized understanding.

## 2020-12-11 NOTE — Patient Instructions (Signed)
Please continue using your CPAP regularly. While your insurance requires that you use CPAP at least 4 hours each night on 70% of the nights, I recommend, that you not skip any nights and use it throughout the night if you can. Getting used to CPAP and staying with the treatment long term does take time and patience and discipline. Untreated obstructive sleep apnea when it is moderate to severe can have an adverse impact on cardiovascular health and raise her risk for heart disease, arrhythmias, hypertension, congestive heart failure, stroke and diabetes. Untreated obstructive sleep apnea causes sleep disruption, nonrestorative sleep, and sleep deprivation. This can have an impact on your day to day functioning and cause daytime sleepiness and impairment of cognitive function, memory loss, mood disturbance, and problems focussing. Using CPAP regularly can improve these symptoms.  Continue close follow up with cardiology, vascular surgery and PCP. Work on getting cholesterol levels down. Continue to monitor for leak. Please let me know if you have any concerns with CPAP.   Follow up in 1 year

## 2020-12-11 NOTE — Progress Notes (Signed)
CM sent to [facility] 

## 2020-12-11 NOTE — Telephone Encounter (Signed)
Daughter of the patient called. She wanted to know an idea of what to expect  before and  after his procedure 12/21/20. Her and her Brother are coordinating care and need to make sure their work schedules and care schedules line up

## 2020-12-11 NOTE — Progress Notes (Addendum)
PATIENT: William [name]. DOB: 04/07/1940  REASON FOR VISIT: follow up HISTORY FROM: patient  Chief Complaint  Patient presents with   Obstructive Sleep Apnea    Emg 4, alone. Here for yearly cpap f/u. Pt reports doing well w cpap therapies. No concerns.      HISTORY OF PRESENT ILLNESS: 12/11/20 ALL: [name] returns for follow up for OSA on CPAP. He is doing well on therapy. He denies concerns with machine or supplies. Facial hari continues to contribute to large air leak. AHI is well managed and he is sleeping well.   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 Cardiology was unable to place stents. He also has left subclavian occlusion, followed by Dr Scot Dock, vascular. He has planned orbital atherectomy and stenting of RCA on 12/21/2020. He continues asa and Brilinta. He was referred to PharmD for lipid management.     12/08/2019 ALL: William [name]. is a 80 y.o. male here today for follow up for OSA on CPAP.  He continues to do well with CPAP therapy.  He does note significant improvement in daytime energy.  He continues to note a leak on his mask.  He feels that nasal pillows are the best fit.  Facial hair is most likely contributing to an adequate mask seal.  Otherwise he is doing very well.  Compliance report dated 11/07/2019 through 12/06/2019 reveals that he used CPAP 29 of the past 30 days for compliance of 97%.  He used CPAP greater than 4 hours 28 of the past 30 days for compliance of 93%.  Average usage on days used was 8 hours and 25 minutes.  Residual AHI was 2.6 on a set pressure of 10 cm of water.  Leak continues to be elevated in the 95th percentile of 53.2 L/min.  HISTORY: (copied from my note on 03/21/2019)  William [name]. is a 80 y.o. male here today for follow up for OSA on CPAP. He reports that he is doing very well with CPAP therapy. He is using CPAP nightly.  He does have a full mustache and beard. He feels that this is why he continues to note a leak. He feels that he has his mask and headgear as tight as he can tolerate.  He does note that he wakes up most mornings around 3:57 AM.  He states that he can sit on the side of the bed for a few minutes and drifts right back off to sleep.  He denies any concerns of nocturia.  He has no trouble with insomnia.  He feels well rested when he wakes.   Compliance report dated 02/15/2019 through 03/16/2019 reveals that he has used CPAP 30 out of the last 30 days for compliance of 100%.  29 of the last 30 days he used CPAP greater than 4 hours for compliance of 97%.  Average usage was 7 hours and 23 minutes.  Residual AHI was 1.6 on a set pressure of 10 cm of water and an EPR of 1 leak noted in the 95th percentile of 43.1.     HISTORY: (copied from Dr Guadelupe Sabin note on 07/13/2018)   William [name] is a 80 year old right-handed gentleman with an underlying complex medical history of peripheral vascular disease with status post iliac stenting, coronary artery disease with status post CABG in 2017, hypertension, vitamin D deficiency, COPD, history of non-STEMI, hyperlipidemia,  carotid artery disease, and overweight state, with whom I am conducting a virtual, phone based follow-up appointment today in lieu of a face-to-face visit for follow-up consultation of his obstructive sleep apnea after interim sleep study testing and starting CPAP therapy. The patient is unaccompanied today and joins from home. I first met him on 02/23/2018 at the request of his primary care physician, Dr. Anastasio Champion, at which time he reported snoring and excessive daytime somnolence. He was advised to proceed with sleep study testing. I went over his test results with him today. He had a baseline sleep study on 04/05/2018 which showed a sleep latency delayed, REM latency 56 minutes, sleep efficiency reduced at 50.6%, he had absence of slow-wave sleep and REM sleep  was 17.8%, total AHI 24.8 per hour, supine AHI 60.4 per hour and REM AHI 52.5 per hour. Average oxygen saturation 92%, nadir of 81%. He had severe PLMS with mild arousals. He was invited for a CPAP titration study. This was on 04/21/2018. Sleep efficiency 57.6%, sleep latency delayed, REM latency 140 minutes. He had near absence of slow-wave sleep and REM sleep was normal at 22.7%. CPAP was titrated from 5 cm to 9 cm. On the final pressure his AHI was 0.5 per hour, O2 nadir of 89% with supine REM sleep achieved. Based on his test results I prescribed CPAP therapy for home use at a pressure of 10 cm.   He also had an interim brain MRI in December 2019 because of his report of intermittent diplopia. He had the study in [place], [place], brain MRI with and without contrast showed periventricular white matter changes, no abnormal enhancement.   Today, 07/13/2018: Please also see below for virtual visit documentation.   I reviewed his CPAP compliance data from 06/12/2018 through 07/11/2018 which is a total of 30 days, during which time he used his machine every night with percent used days greater than 4 hours at 90%, indicating excellent compliance with an average usage of 5 hours and 39 minutes, residual AHI at goal at 2.3 per hour, leak high with the 95th percentile at 42.2 L/m on a pressure of 10 cm.   Previously:   02/23/2018: (He) reports snoring and excessive daytime somnolence. I reviewed your office note from 01/05/2018, when he saw Jeralyn Ruths, nurse practitioner. His Epworth sleepiness score is 8 out of 24 today, fatigue score is 32 out of 63. He is widowed and lives alone, he has 2 children. 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 He drinks caffeine in the form of coffee, 2 per day on average. He had a BMP checked at your office on 01/19/2018 and  I reviewed the results: BUN was 26, creatinine 0.94. He is trying to hydrate better. He reports a history of double vision for the past 5 years, symptoms have been infrequent, very brief, less than a minute at a time but have increased in frequency, maybe averaging once a month whereas he may have had double vision once every few months in the past. He has seen ophthalmology for this. He denies any one-sided weakness or numbness or stroke like symptoms in the past. He is followed by cardiology. He was encouraged to consider a sleep study by his cardiologist as well in the past. He has never had a sleep study. He does snore  loudly. He is not aware of any family history of OSA. His bedtime is around 8:30 and rise time around 3:30. He has nocturia about twice per average night and denies morning headaches.   REVIEW OF SYSTEMS: Out of a complete 14 system review of symptoms, the patient complains only of the following symptoms, shortness of breath, chest pain  and all other reviewed systems are negative.  ESS: 2   ALLERGIES: No Known Allergies  HOME MEDICATIONS: Outpatient Medications Prior to Visit  Medication Sig Dispense Refill   amLODipine (NORVASC) 10 MG tablet Take 1 tablet (10 mg total) by mouth daily. (Patient taking differently: Take 10 mg by mouth every evening.) 90 tablet 3   Ascorbic Acid (VITAMIN C) 1000 MG tablet Take 1,000 mg by mouth in the morning.     aspirin EC 81 MG tablet Take 1 tablet (81 mg total) by mouth daily.     cholecalciferol (VITAMIN D) 25 MCG (1000 UNIT) tablet Take 1,000 Units by mouth in the morning.     fish oil-omega-3 fatty acids 1000 MG capsule Take 1,000 mg by mouth in the morning.     folic acid (FOLVITE) 657 MCG tablet Take 400 mcg by mouth in the morning.     Garlic 8469 MG TBEC Take 2,000 mg by mouth in the morning.     ibuprofen (ADVIL) 200 MG tablet Take 200 mg by mouth every 8 (eight) hours as needed (pain).     isosorbide mononitrate (IMDUR) 60 MG 24  hr tablet Take 1 tablet (60 mg total) by mouth daily. (Patient taking differently: Take 60 mg by mouth every evening.) 30 tablet 6   metoprolol tartrate (LOPRESSOR) 50 MG tablet Take 1 tablet (50 mg total) by mouth 2 (two) times daily. 60 tablet 11   Misc. Devices MISC by Does not apply route. CPAP     Multiple Vitamin (MULTIVITAMIN WITH MINERALS) TABS tablet Take 1 tablet by mouth in the morning.     nitroGLYCERIN (NITROSTAT) 0.4 MG SL tablet Place 1 tablet (0.4 mg total) under the tongue every 5 (five) minutes as needed for chest pain. If pain does not resolve with rest. 50 tablet 3   NP THYROID 60 MG tablet TAKE 1 TABLET BY MOUTH ONCE DAILY BEFORE BREAKFAST 90 tablet 0   olmesartan (BENICAR) 40 MG tablet Take 1 tablet (40 mg total) by mouth daily. (Patient taking differently: Take 40 mg by mouth every evening.) 30 tablet 11   rosuvastatin (CRESTOR) 10 MG tablet TAKE 1 TABLET BY MOUTH EVERY DAY N (Patient taking differently: Take 10 mg by mouth every evening.) 90 tablet 3   thiamine (VITAMIN B-1) 100 MG tablet Take 100 mg by mouth every evening.      ticagrelor (BRILINTA) 90 MG TABS tablet Take 1 tablet (90 mg total) by mouth 2 (two) times daily. 60 tablet 11   Tiotropium Bromide-Olodaterol (STIOLTO RESPIMAT) 2.5-2.5 MCG/ACT AERS Inhale 2 puffs into the lungs daily. 1 each 11   No facility-administered medications prior to visit.    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[place])    Myocardial infarction ([place])    Peripheral arterial disease ([place])  Suboptimal revascularization options    PAST SURGICAL HISTORY: Past Surgical History:  Procedure Laterality Date   ABDOMINAL AORTOGRAM W/LOWER EXTREMITY N/A 08/21/2017    Procedure: ABDOMINAL AORTOGRAM W/LOWER EXTREMITY;  Surgeon: Chuck Hint, MD;  Location: [facility] INVASIVE CV LAB;  Service: Cardiovascular;  Laterality: N/A;   Bilateral renal  artery stenoses  04/23/2009   CARDIAC CATHETERIZATION N/A 05/21/2015   Procedure: Left Heart Cath and Coronary Angiography;  Surgeon: Runell Gess, MD;  Location: [facility] INVASIVE CV LAB;  Service: Cardiovascular;  Laterality: N/A;   CORONARY ARTERY BYPASS GRAFT N/A 05/22/2015   Procedure: CORONARY ARTERY BYPASS GRAFTING (CABG) LIMA-LAD and SVG-OM EVH RIGHT THIGH GREATER SAPHENOUS VEIN;  Surgeon: Delight Ovens, MD;  Location: [facility] OR;  Service: Open Heart Surgery;  Laterality: N/A;   CORONARY BALLOON ANGIOPLASTY N/A 12/06/2020   Procedure: CORONARY BALLOON ANGIOPLASTY;  Surgeon: Kathleene Hazel, MD;  Location: [facility] INVASIVE CV LAB;  Service: Cardiovascular;  Laterality: N/A;   HIATAL HERNIA REPAIR     LEFT HEART CATH AND CORS/GRAFTS ANGIOGRAPHY N/A 12/06/2020   Procedure: LEFT HEART CATH AND CORS/GRAFTS ANGIOGRAPHY;  Surgeon: Kathleene Hazel, MD;  Location: [facility] INVASIVE CV LAB;  Service: Cardiovascular;  Laterality: N/A;   Percutaneous translumnial angioplasty  04/23/2009   Catheterization of [place] external iliac artery with Left lower extremity runoff   TEE WITHOUT CARDIOVERSION N/A 05/22/2015   Procedure: TRANSESOPHAGEAL ECHOCARDIOGRAM (TEE);  Surgeon: Delight Ovens, MD;  Location: [facility] OR;  Service: Open Heart Surgery;  Laterality: N/A;    FAMILY HISTORY: Family History  Problem Relation Age of Onset   Heart disease Father        Heart Disease before age 55   Pulmonary embolism Father    Deep vein thrombosis Father    Cancer Mother        Sarcoma    SOCIAL HISTORY: Social History   Socioeconomic History   Marital status: Married    Spouse name: Not on file   Number of children: Not on file   Years of education: Not on file   Highest education level: Not on file  Occupational History    Not on file  Tobacco Use   Smoking status: Former    Packs/day: 2.00    Years: 30.00    Pack years: 60.00    Types: Cigarettes    Start date: 06/03/1961    Quit date: 11/17/1991    Years since quitting: 29.0   Smokeless tobacco: Former    Types: Snuff  Vaping Use   Vaping Use: Never used  Substance and Sexual Activity   Alcohol use: Not Currently    Alcohol/week: 2.0 - 4.0 standard drinks    Types: 2 - 4 Standard drinks or equivalent per week    Comment: used to    Drug use: No   Sexual activity: Not Currently  Other Topics Concern   Not on file  Social History Narrative   Retired used to work in [employer]. Widower. Lives alone.   Social Determinants of Health   Financial Resource Strain: Not on file  Food Insecurity: Not on file  Transportation Needs: Not on file  Physical Activity: Not on file  Stress: Not on file  Social Connections: Not on file  Intimate Partner Violence: Not on file      PHYSICAL EXAM  Vitals:   12/11/20 0944  BP: (!) 166/61  Pulse: 73  Weight: 164 lb (74.4 kg)  Height: 5\' 7"  (1.702 m)  Body mass index is 25.69 kg/m.  Generalized: Well developed, in no acute distress  Cardiology: normal rate and rhythm, no murmur noted Respiratory: clear to auscultation bilaterally  Neurological examination  Mentation: Alert oriented to time, place, history taking. Follows all commands speech and language fluent Cranial nerve II-XII: Pupils were equal round reactive to light. Extraocular movements were full, visual field were full  Motor: The motor testing reveals 5 over 5 strength of all 4 extremities. Good symmetric motor tone is noted throughout.  Gait and station: Gait is normal.    DIAGNOSTIC DATA (LABS, IMAGING, TESTING) - I reviewed patient records, labs, notes, testing and imaging myself where available.  No flowsheet data found.   Lab Results  Component Value Date   WBC 8.3 12/07/2020   HGB 13.8 12/07/2020   HCT 40.7 12/07/2020    MCV 92.5 12/07/2020   PLT 244 12/07/2020      Component Value Date/Time   NA 139 12/07/2020 0255   K 5.0 12/07/2020 0255   CL 111 12/07/2020 0255   CO2 20 (L) 12/07/2020 0255   GLUCOSE 96 12/07/2020 0255   BUN 28 (H) 12/07/2020 0255   CREATININE 1.29 (H) 12/07/2020 0255   CREATININE 1.06 07/04/2020 1018   CALCIUM 9.1 12/07/2020 0255   PROT 6.4 07/04/2020 1018   ALBUMIN 3.5 05/19/2015 0224   AST 16 07/04/2020 1018   ALT 16 07/04/2020 1018   ALKPHOS 45 05/19/2015 0224   BILITOT 0.5 07/04/2020 1018   GFRNONAA 56 (L) 12/07/2020 0255   GFRNONAA 66 07/04/2020 1018   GFRAA 77 07/04/2020 1018   Lab Results  Component Value Date   CHOL 139 07/04/2020   HDL 36 (L) 07/04/2020   LDLCALC 81 07/04/2020   TRIG 128 07/04/2020   CHOLHDL 3.9 07/04/2020   Lab Results  Component Value Date   HGBA1C 6.0 (H) 05/21/2015   No results found for: VITAMINB12 Lab Results  Component Value Date   TSH 0.84 08/28/2020       ASSESSMENT AND PLAN 79 y.o. 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 here with     ICD-10-CM   1. OSA on CPAP  G47.33 For home use only DME continuous positive airway pressure (CPAP)   Z99.89         Compliance report reveals excellent compliance.  He does continue to have significant leak most likely related to facial hair.  He is happy with the fit of his mask and does not feel that a mask refitting will be beneficial. He was encouraged to continue monitoring for correct mask seal at home.  He will notify me of any concerns of elevated apneic events as indicated on his CPAP machine.   He was encouraged to continue healthy lifestyle habits.  He will continue close follow-up with cardiology, vascular and primary care as  directed.  He will follow-up with me in 1 year, sooner if needed.  He verbalizes understanding and agreement with this plan.  Orders Placed This Encounter  Procedures   For home use only DME continuous positive airway pressure (CPAP)    Supplies    Order Specific Question:   Length of Need    Answer:   Lifetime    Order Specific Question:   Patient has OSA or probable OSA    Answer:  Yes    Order Specific Question:   Is the patient currently using CPAP in the home    Answer:   Yes    Order Specific Question:   Settings    Answer:   Other see comments    Order Specific Question:   CPAP supplies needed    Answer:   Mask, headgear, cushions, filters, heated tubing and water chamber      No orders of the defined types were placed in this encounter.    Debbora Presto, FNP-C 12/11/2020, 10:15 AM [facility] [address], [place], [place] [address] [phone]   I reviewed the above note and documentation by the Nurse Practitioner and agree with the history, exam, assessment and plan as outlined above. I was available for consultation. Star Age, MD, PhD [facility])

## 2020-12-13 ENCOUNTER — Telehealth: Payer: Medicare Other

## 2020-12-13 ENCOUNTER — Ambulatory Visit (INDEPENDENT_AMBULATORY_CARE_PROVIDER_SITE_OTHER): Payer: Medicare Other | Admitting: Family Medicine

## 2020-12-13 ENCOUNTER — Telehealth: Payer: Self-pay | Admitting: *Deleted

## 2020-12-13 ENCOUNTER — Encounter: Payer: Self-pay | Admitting: Family Medicine

## 2020-12-13 VITALS — BP 158/60 | HR 74 | Ht 67.0 in | Wt 162.6 lb

## 2020-12-13 DIAGNOSIS — J449 Chronic obstructive pulmonary disease, unspecified: Secondary | ICD-10-CM | POA: Diagnosis not present

## 2020-12-13 DIAGNOSIS — E782 Mixed hyperlipidemia: Secondary | ICD-10-CM | POA: Diagnosis not present

## 2020-12-13 DIAGNOSIS — I739 Peripheral vascular disease, unspecified: Secondary | ICD-10-CM | POA: Diagnosis not present

## 2020-12-13 DIAGNOSIS — I2510 Atherosclerotic heart disease of native coronary artery without angina pectoris: Secondary | ICD-10-CM | POA: Diagnosis not present

## 2020-12-13 DIAGNOSIS — I77819 Aortic ectasia, unspecified site: Secondary | ICD-10-CM | POA: Diagnosis not present

## 2020-12-13 NOTE — Telephone Encounter (Signed)
Reviewed procedure instructions with patient's daughter (DPR), Dominica and sent instructions by MyChart.  Jodi Mourning will review with patient and is aware I will follow up with patient next week.

## 2020-12-13 NOTE — H&P (View-Only) (Signed)
Cardiology Office Note  Date: 12/13/2020   ID: William Bachelor., DOB 09-14-1940, MRN [id number]  PCP:  Pcp, No  Cardiologist:  Rozann Lesches, MD Electrophysiologist:  None   Chief Complaint: Cardiac catheterization follow up / Groin hematoma  History of Present Illness: William Fruin. is a 80 y.o. male with a history of angina, HTN, NSTEMI carotid artery disease, HLD, HTN, COPD, PAD. Iliac artery aneurysm.   He was last seen by Dr Domenic Polite on 12/04/2020.  He continued with anginal symptoms in spite of medical therapy. Dr Domenic Polite scheduled him for a cardiac catheterization.    He was admitted and had cardiac catheterization on 12/06/2021 by Dr Angelena Form.  Cardiac catheterization was severe complex three-vessel disease.  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  Presumed there was not adequate flow to the LIMA with CTO of the left subclavian.  Attempted balloon angioplasty of the mid RCA lesion but unable to deliver stents.  Placed on DAPT with aspirin and Brilinta with plans to bring back on 12/21/2020 for orbital atherectomy and stenting of RCA.  He was discharged on aspirin, Brilinta, Crestor 10 mg daily, Imdur 60 mg daily, metoprolol 25 mg p.o. twice daily increased to 50 mg p.o. twice daily.  Amlodipine 10 mg daily and olmesartan 40 mg daily.    He is here today status post hospitalization and cardiac catheterization/intervention with balloon angioplasty to RCA.  He states he is feeling somewhat better and no current chest pain.  Still has a fairly large bruised/ecchymotic area on his left upper thigh.  He denies any pain, swelling, in the area.  His blood pressure is elevated on arrival at 158/60.  He states his systolic blood pressure this morning when he checked it at home was was 125.  States he has taken all of his medications today.  He is aware he has a cardiac catheterization/atherectomy scheduled on September 23rd with Dr.  Angelena Form.  He states no one has called him regarding the time or duration he needs to stay.  I spoke to nursing staff who will call to clarify with Cath Lab.  He currently denies any DOE or SOB beyond his baseline dyspnea.  He denies any current anginal symptoms or nitroglycerin use.  Denies any palpitations or arrhythmias, orthopnea, PND, CVA or TIA-like symptoms.  Denies any bleeding issues.  Denies any claudication-like symptoms, DVT or PE-like symptoms, or lower extremity edema.   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[facility])    Myocardial infarction ([place])    Peripheral arterial disease ([place])    Suboptimal revascularization options    Past Surgical History:  Procedure Laterality Date   ABDOMINAL AORTOGRAM W/LOWER EXTREMITY N/A 08/21/2017   Procedure: ABDOMINAL AORTOGRAM W/LOWER EXTREMITY;  Surgeon: Angelia Mould, MD;  Location: [place] [facility];  Service: Cardiovascular;  Laterality: N/A;   Bilateral renal  artery stenoses  04/23/2009   CARDIAC CATHETERIZATION N/A 05/21/2015   Procedure: Left Heart Cath and Coronary Angiography;  Surgeon: Lorretta Harp, MD;  Location: [facility];  Service: Cardiovascular;  Laterality: N/A;   CORONARY ARTERY BYPASS GRAFT N/A 05/22/2015  Procedure: CORONARY ARTERY BYPASS GRAFTING (CABG) LIMA-LAD and SVG-OM EVH RIGHT THIGH GREATER SAPHENOUS VEIN;  Surgeon: [name], MD;  Location: [facility];  Service: Open Heart Surgery;  Laterality: N/A;   CORONARY BALLOON ANGIOPLASTY N/A 12/06/2020   Procedure: CORONARY BALLOON ANGIOPLASTY;  Surgeon: Burnell Blanks, MD;  Location: [facility];  Service: Cardiovascular;  Laterality: N/A;   HIATAL HERNIA REPAIR     LEFT HEART CATH AND  CORS/GRAFTS ANGIOGRAPHY N/A 12/06/2020   Procedure: LEFT HEART CATH AND CORS/GRAFTS ANGIOGRAPHY;  Surgeon: Burnell Blanks, MD;  Location: [facility];  Service: Cardiovascular;  Laterality: N/A;   Percutaneous translumnial angioplasty  04/23/2009   Catheterization of [place] external iliac artery with Left lower extremity runoff   TEE WITHOUT CARDIOVERSION N/A 05/22/2015   Procedure: TRANSESOPHAGEAL ECHOCARDIOGRAM (TEE);  Surgeon: [name], MD;  Location: [facility];  Service: Open Heart Surgery;  Laterality: N/A;    Current Outpatient Medications  Medication Sig Dispense Refill   amLODipine (NORVASC) 10 MG tablet Take 1 tablet (10 mg total) by mouth daily. (Patient taking differently: Take 10 mg by mouth every evening.) 90 tablet 3   Ascorbic Acid (VITAMIN C) 1000 MG tablet Take 1,000 mg by mouth in the morning.     aspirin EC 81 MG tablet Take 1 tablet (81 mg total) by mouth daily.     cholecalciferol (VITAMIN D) 25 MCG (1000 UNIT) tablet Take 1,000 Units by mouth in the morning.     fish oil-omega-3 fatty acids 1000 MG capsule Take 1,000 mg by mouth in the morning.     folic acid (FOLVITE) [id number] MCG tablet Take 400 mcg by mouth in the morning.     Garlic [id number] MG TBEC Take 2,000 mg by mouth in the morning.     ibuprofen (ADVIL) 200 MG tablet Take 200 mg by mouth every 8 (eight) hours as needed (pain).     isosorbide mononitrate (IMDUR) 60 MG 24 hr tablet Take 1 tablet (60 mg total) by mouth daily. (Patient taking differently: Take 60 mg by mouth every evening.) 30 tablet 6   metoprolol tartrate (LOPRESSOR) 50 MG tablet Take 1 tablet (50 mg total) by mouth 2 (two) times daily. 60 tablet 11   Misc. Devices MISC by Does not apply route. CPAP     Multiple Vitamin (MULTIVITAMIN WITH MINERALS) TABS tablet Take 1 tablet by mouth in the morning.     nitroGLYCERIN (NITROSTAT) 0.4 MG SL tablet Place 1 tablet (0.4 mg total) under the tongue every 5 (five) minutes as needed for chest pain. If  pain does not resolve with rest. 50 tablet 3   NP THYROID 60 MG tablet TAKE 1 TABLET BY MOUTH ONCE DAILY BEFORE BREAKFAST 90 tablet 0   olmesartan (BENICAR) 40 MG tablet Take 1 tablet (40 mg total) by mouth daily. (Patient taking differently: Take 40 mg by mouth every evening.) 30 tablet 11   rosuvastatin (CRESTOR) 10 MG tablet TAKE 1 TABLET BY MOUTH EVERY DAY N (Patient taking differently: Take 10 mg by mouth every evening.) 90 tablet 3   thiamine (VITAMIN B-1) 100 MG tablet Take 100 mg by mouth every evening.      ticagrelor (BRILINTA) 90 MG TABS tablet Take 1 tablet (90 mg total) by mouth 2 (two) times daily. 60 tablet 11   Tiotropium Bromide-Olodaterol (STIOLTO RESPIMAT) 2.5-2.5 MCG/ACT AERS Inhale 2 puffs into the lungs daily. 1 each 11   No current facility-administered medications for this visit.  Allergies:  Patient has no known allergies.   Social History: The patient  reports that he quit smoking about 29 years ago. His smoking use included cigarettes. He started smoking about 59 years ago. He has a 60.00 pack-year smoking history. He has quit using smokeless tobacco.  His smokeless tobacco use included snuff. He reports that he does not currently use alcohol after a past usage of about 2.0 - 4.0 standard drinks per week. He reports that he does not use drugs.   Family History: The patient's family history includes Cancer in his mother; Deep vein thrombosis in his father; Heart disease in his father; Pulmonary embolism in his father.   ROS:  Please see the history of present illness. Otherwise, complete review of systems is positive for none.  All other systems are reviewed and negative.   Physical Exam: VS:  BP (!) 158/60   Pulse 74   Ht '5\' 7"'$  (1.702 m)   Wt 162 lb 9.6 oz (73.8 kg)   SpO2 97%   BMI 25.47 kg/m , BMI Body mass index is 25.47 kg/m.  Wt Readings from Last 3 Encounters:  12/13/20 162 lb 9.6 oz (73.8 kg)  12/11/20 164 lb (74.4 kg)  12/06/20 164 lb (74.4 kg)     General: Patient appears comfortable at rest. Neck: Supple, no elevated JVP or carotid bruits, no thyromegaly. Lungs: Clear to auscultation, nonlabored breathing at rest. Cardiac: Regular rate and rhythm, no S3 or significant systolic murmur, no pericardial rub. Extremities: No pitting edema, distal pulses 2+. Skin: Warm and dry.  Large ecchymotic area on anterior thigh and groin area.  1+ femoral pulse. Musculoskeletal: No kyphosis. Neuropsychiatric: Alert and oriented x3, affect grossly appropriate.  ECG:  EKG labs obtained 12/18/2020 normal sinus rhythm rate of 67, possible left atrial enlargement  Recent Labwork: 07/04/2020: ALT 16; AST 16 08/28/2020: TSH 0.84 12/07/2020: BUN 28; Creatinine, Ser 1.29; Hemoglobin 13.8; Platelets 244; Potassium 5.0; Sodium 139     Component Value Date/Time   CHOL 139 07/04/2020 1018   TRIG 128 07/04/2020 1018   HDL 36 (L) 07/04/2020 1018   CHOLHDL 3.9 07/04/2020 1018   VLDL 15 05/19/2015 0224   LDLCALC 81 07/04/2020 1018    Other Studies Reviewed Today:    CORONARY BALLOON ANGIOPLASTY  LEFT HEART CATH AND CORS/GRAFTS ANGIOGRAPHY  12/06/2020 Conclusion      Dist RCA lesion is 70% stenosed.   Ost LM to Mid LM lesion is 99% stenosed.   Ost Cx to Prox Cx lesion is 60% stenosed.   Origin to Prox Graft lesion is 100% stenosed.   Prox LAD lesion is 100% stenosed.   Mid RCA-1 lesion is 99% stenosed.   Mid RCA-2 lesion is 99% stenosed.   Scoring balloon angioplasty was performed using a BALLOON SCOREFLEX 3.0X15.   Post intervention, there is a 80% residual stenosis.   Post intervention, there is a 30% residual stenosis.   SVG graft was visualized by angiography.   LIMA graft was not visualized.   The graft exhibits no disease.   The left ventricular systolic function is normal.   LV end diastolic pressure is normal.   The left ventricular ejection fraction is 55-65% by visual estimate.   There is no mitral valve regurgitation.   Severe three  vessel CAD including severe left main stenosis s/p 2V CABG with 1/2 patent bypass grafts 99% calcified left main stenosis Chronic occlusion proximal LAD. The distal LAD is seen to fill from right to left  collaterals. 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 It is presumed that there is not adequate flow into the LIMA graft given the CTO of the left subclavian artery.  The proximal Circumflex has a moderate stenosis. The vein graft to the obtuse marginal branch is patent.  The RCA is a large dominant artery with serial severe, calcified mid and distal lesions.  Balloon angioplasty and scoring balloon angioplasty of the mid RCA lesions. Unable to deliver stents to the mid RCA lesions despite adequate standard balloon and scoring balloon expansion.  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 Scot Dock. This supplies the LIMA graft which I could not visualize today due to the occlusion of the subclavian artery. I suspect that his RCA disease in the culprit for his recent angina. Attempted PCI today but will need to bring him back in 2 weeks for orbital atherectomy and stenting of the RCA. I have set this up for 12/21/20 at 7:30 am. I have spoken to the [employer] rep who can be here that day. He will continue on ASA and Brilinta. Monitor overnight on telemetry. Plan d/c home tomorrow on ASA/Brilinta if stable.   Diagnostic Dominance: Right Intervention     CT chest without contrast 11/01/2020 IMPRESSION: 1. Mild subpleural reticulation and ground-glass opacity in the periphery of the lung bases, nonspecific. This may be secondary to post infectious/inflammatory scarring, however interstitial lung disease is not excluded. Consider follow-up high-resolution chest CT in 6-12 months to assess for temporal  change. 2. Linear and bandlike opacities within the lingula and both lower lobes typical of atelectasis and/or scarring. No evidence of pneumonia or neoplasm. 3. Moderate emphysema. 4. Patulous esophagus with tiny hiatal hernia. 5. Advanced aortic atherosclerosis. 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 Recommend annual imaging followup by CTA or MRA. This recommendation follows 2010 ACCF/AHA/AATS/ACR/ASA/SCA/SCAI/SIR/STS/SVM Guidelines for the Diagnosis and Management of Patients with Thoracic Aortic Disease. Circulation. 2010; [id number]. Aortic aneurysm NOS (ICD10-I71.9) 6. Dense calcification in the proximal left subclavian likely causes hemodynamic significant stenosis. Post CABG with calcification of native coronary arteries.   Aortic Atherosclerosis (ICD10-I70.0) and Emphysema (ICD10-J43.9).      Echocardiogram 06/22/2019:  1. Left ventricular ejection fraction, by estimation, is 65 to 70%. The  left ventricle has normal function. The left ventricle has no regional  wall motion abnormalities. There is mild left ventricular hypertrophy.  Left ventricular diastolic parameters  are indeterminate.   2. Right ventricular systolic function is normal. The right ventricular  size is normal. There is normal pulmonary artery systolic pressure. The  estimated right ventricular systolic pressure is [id number] mmHg.   3. Left atrial size was upper normal.   4. The mitral valve is grossly normal. Trivial mitral valve  regurgitation.   5. The aortic valve is tricuspid. Aortic valve regurgitation is mild.  Mild to moderate aortic valve sclerosis/calcification is present, without  any evidence of aortic stenosis.   6. The inferior vena cava is normal in size with greater than 50%  respiratory variability, suggesting right atrial pressure of 3 mmHg.    Lexiscan Myoview 05/07/2020: There was no ST segment deviation noted during stress. Findings consistent with prior  anteroseptal/septal myocardial infarction with moderate peri-infarct ischemia. This is an intermediate risk study. Mildly elevated TID of 1.31 may  suggest a component of balanced ischemia and higher risk. The left ventricular ejection fraction is normal (55-65%).   Chest CT 11/01/2020: IMPRESSION: 1. Mild subpleural reticulation and ground-glass opacity in the periphery of the lung bases, nonspecific. This may be secondary to post infectious/inflammatory scarring, however interstitial lung disease is not excluded. Consider follow-up high-resolution chest CT in 6-12 months to assess for temporal change. 2. Linear and bandlike opacities within the lingula and both lower lobes typical of atelectasis and/or scarring. No evidence of pneumonia or neoplasm. 3. Moderate emphysema. 4. Patulous esophagus with tiny hiatal hernia. 5. Advanced aortic atherosclerosis. 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 Recommend annual imaging followup by CTA or MRA. This recommendation follows 2010 ACCF/AHA/AATS/ACR/ASA/SCA/SCAI/SIR/STS/SVM Guidelines for the Diagnosis and Management of Patients with Thoracic Aortic Disease. Circulation. 2010; [id number]. Aortic aneurysm NOS (ICD10-I71.9) 6. Dense calcification in the proximal left subclavian likely causes hemodynamic significant stenosis. Post CABG with calcification of native coronary arteries.     Assessment and Plan:  1. CAD in native artery   2. PAD (peripheral artery disease) ([place])   3. COPD GOLD 2    4. Mixed hyperlipidemia   5. Aortic dilatation ([facility])    1. CAD in native artery Recent cardiac catheterization by Dr. Angelena Form with balloon angioplasty to RCA.  States he is feeling better and has no current chest pain.  No nitroglycerin use.  Continues to have a fairly large ecchymotic area on his left thigh from hematoma after recent cardiac catheterization.  No swelling or drainage noted.  He denies any pain in the groin  area.  Femoral pulse 1+.  He has follow-up PCI on September 23 with Dr. Angelena Form.  Continue Brilinta 90 mg p.o. twice daily.  Continue aspirin 81 mg daily.  Continue Imdur 60 mg p.o. daily.  Continue sublingual nitroglycerin as needed.   2. PAD (peripheral artery disease) ([place]) Denies any claudication on visit today.  He has severe PAD with claudication managed medically.  Recent ABIs significantly abnormal.  ABIs on 10/17/2020: Right resting ABI since with severe right lower extremity arterial disease.  Right toe brachial index was abnormal.  Left resting ankle-brachial index indicates severe left lower extremity arterial disease, left toe brachial index abnormal.  Following with Dr. [name] vascular surgery.  3. COPD GOLD 2 History of COPD.  He sees Dr. Christinia Gully pulmonology.  Had a recent CT of chest secondary to abnormal chest x-ray with lung opacities ordered by Dr. Work.  See results above  4. Mixed hyperlipidemia Continue Crestor 10 mg daily.  Continue fish oil 1000 mg daily.  Lipid panel 07/04/2020: TC 139, HDL 36, TG 128, LDL 81  5.  Fusiform aneurysmal dilatation of ascending aorta Recent chest CT ordered by Dr. Melvyn Novas demonstrated advanced aortic atherosclerosis. 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 Recommend annual imaging followup by CTA or MRA.    Medication Adjustments/Labs and Tests Ordered: Current medicines are reviewed at length with the patient today.  Concerns regarding medicines are outlined above.   Disposition: Follow-up with Dr. Domenic Polite or APP pending coronary intervention  Signed, Levell July, NP 12/13/2020 4:55 PM    [facility] at [place], [place], [place] [address] Phone: [phone]; Fax: [phone]

## 2020-12-13 NOTE — Patient Instructions (Signed)
Medication Instructions:  Your physician recommends that you continue on your current medications as directed. Please refer to the Current Medication list given to you today.  Labwork: none  Testing/Procedures: none  Follow-Up: Your physician recommends that you schedule a follow-up appointment in: pending  Any Other Special Instructions Will Be Listed Below (If Applicable).  If you need a refill on your cardiac medications before your next appointment, please call your pharmacy.

## 2020-12-13 NOTE — Telephone Encounter (Signed)
Coronary Stent scheduled at [facility] for: Friday December 21, 2020 7:30 [facility] Main Entr[facility]) at: 5:30 AM   No solid food after midnight prior to cath, clear liquids until 5 AM day of procedure.  Medication instructions: Hold: -Olmesartan-day before and day of cath-GFR 54 -Ibuprofen-day before and day of cath-GFR 56   Except hold medications morning medications can be taken pre-cath with sips of water including: -aspirin 81 mg -Brilinta 90 mg    Confirmed patient has responsible adult to drive home post procedure and be with patient first 24 hours after arriving home.  Patients are allowed one visitor in the waiting room during the time they are at the hospital for their procedure. Both patient and visitor must wear a mask once they enter the hospital.   Patient reports does not currently have any symptoms concerning for COVID-19 and no household members with COVID-19 like illness.

## 2020-12-13 NOTE — Progress Notes (Signed)
Cardiology Office Note  Date: 12/13/2020   ID: William [name]., DOB [birth year], MRN [id number]  PCP:  Pcp, No  Cardiologist:  Rozann Lesches, MD Electrophysiologist:  None   Chief Complaint: Cardiac catheterization follow up / Groin hematoma  History of Present Illness: William [name]. is a 80 y.o. male with a history of angina, HTN, NSTEMI carotid artery disease, HLD, HTN, COPD, PAD. Iliac artery aneurysm.   He was last seen by Dr Domenic Polite on 12/04/2020.  He continued with anginal symptoms in spite of medical therapy. Dr Domenic Polite scheduled him for a cardiac catheterization.    He was admitted and had cardiac catheterization on 12/06/2021 by Dr Angelena Form.  Cardiac catheterization was severe complex three-vessel disease.  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  Presumed there was not adequate flow to the LIMA with CTO of the left subclavian.  Attempted balloon angioplasty of the mid RCA lesion but unable to deliver stents.  Placed on DAPT with aspirin and Brilinta with plans to bring back on 12/21/2020 for orbital atherectomy and stenting of RCA.  He was discharged on aspirin, Brilinta, Crestor 10 mg daily, Imdur 60 mg daily, metoprolol 25 mg p.o. twice daily increased to 50 mg p.o. twice daily.  Amlodipine 10 mg daily and olmesartan 40 mg daily.    He is here today status post hospitalization and cardiac catheterization/intervention with balloon angioplasty to RCA.  He states he is feeling somewhat better and no current chest pain.  Still has a fairly large bruised/ecchymotic area on his left upper thigh.  He denies any pain, swelling, in the area.  His blood pressure is elevated on arrival at 158/60.  He states his systolic blood pressure this morning when he checked it at home was was 125.  States he has taken all of his medications today.  He is aware he has a cardiac catheterization/atherectomy scheduled on September 23rd with Dr.  Angelena Form.  He states no one has called him regarding the time or duration he needs to stay.  I spoke to nursing staff who will call to clarify with Cath Lab.  He currently denies any DOE or SOB beyond his baseline dyspnea.  He denies any current anginal symptoms or nitroglycerin use.  Denies any palpitations or arrhythmias, orthopnea, PND, CVA or TIA-like symptoms.  Denies any bleeding issues.  Denies any claudication-like symptoms, DVT or PE-like symptoms, or lower extremity edema.   Past Medical History:  Diagnosis Date   Carotid artery disease ([place])    Colon polyp    COPD (chronic obstructive pulmonary disease) ([place])    Coronary artery disease    Multivessel status post CABG with LIMA to LAD and SVG to OM1 2017   Hyperlipidemia    Hypertension    Iliac artery aneurysm (HCC)    Myocardial infarction ([place])    Peripheral arterial disease ([place])    Suboptimal revascularization options    Past Surgical History:  Procedure Laterality Date   ABDOMINAL AORTOGRAM W/LOWER EXTREMITY N/A 08/21/2017   Procedure: ABDOMINAL AORTOGRAM W/LOWER EXTREMITY;  Surgeon: Angelia Mould, MD;  Location: [facility];  Service: Cardiovascular;  Laterality: N/A;   Bilateral renal  artery stenoses  04/23/2009   CARDIAC CATHETERIZATION N/A 05/21/2015   Procedure: Left Heart Cath and Coronary Angiography;  Surgeon: Lorretta Harp, MD;  Location: [facility];  Service: Cardiovascular;  Laterality: N/A;   CORONARY ARTERY BYPASS GRAFT N/A 05/22/2015  Procedure: CORONARY ARTERY BYPASS GRAFTING (CABG) LIMA-LAD and SVG-OM EVH RIGHT THIGH GREATER SAPHENOUS VEIN;  Surgeon: [name], MD;  Location: [facility];  Service: Open Heart Surgery;  Laterality: N/A;   CORONARY BALLOON ANGIOPLASTY N/A 12/06/2020   Procedure: CORONARY BALLOON ANGIOPLASTY;  Surgeon: Burnell Blanks, MD;  Location: [facility];  Service: Cardiovascular;  Laterality: N/A;   HIATAL HERNIA REPAIR     LEFT HEART CATH AND  CORS/GRAFTS ANGIOGRAPHY N/A 12/06/2020   Procedure: LEFT HEART CATH AND CORS/GRAFTS ANGIOGRAPHY;  Surgeon: Burnell Blanks, MD;  Location: [facility];  Service: Cardiovascular;  Laterality: N/A;   Percutaneous translumnial angioplasty  04/23/2009   Catheterization of [place] external iliac artery with Left lower extremity runoff   TEE WITHOUT CARDIOVERSION N/A 05/22/2015   Procedure: TRANSESOPHAGEAL ECHOCARDIOGRAM (TEE);  Surgeon: [name], MD;  Location: [facility];  Service: Open Heart Surgery;  Laterality: N/A;    Current Outpatient Medications  Medication Sig Dispense Refill   amLODipine (NORVASC) 10 MG tablet Take 1 tablet (10 mg total) by mouth daily. (Patient taking differently: Take 10 mg by mouth every evening.) 90 tablet 3   Ascorbic Acid (VITAMIN C) 1000 MG tablet Take 1,000 mg by mouth in the morning.     aspirin EC 81 MG tablet Take 1 tablet (81 mg total) by mouth daily.     cholecalciferol (VITAMIN D) 25 MCG (1000 UNIT) tablet Take 1,000 Units by mouth in the morning.     fish oil-omega-3 fatty acids 1000 MG capsule Take 1,000 mg by mouth in the morning.     folic acid (FOLVITE) A999333 MCG tablet Take 400 mcg by mouth in the morning.     Garlic [id number] MG TBEC Take 2,000 mg by mouth in the morning.     ibuprofen (ADVIL) 200 MG tablet Take 200 mg by mouth every 8 (eight) hours as needed (pain).     isosorbide mononitrate (IMDUR) 60 MG 24 hr tablet Take 1 tablet (60 mg total) by mouth daily. (Patient taking differently: Take 60 mg by mouth every evening.) 30 tablet 6   metoprolol tartrate (LOPRESSOR) 50 MG tablet Take 1 tablet (50 mg total) by mouth 2 (two) times daily. 60 tablet 11   Misc. Devices MISC by Does not apply route. CPAP     Multiple Vitamin (MULTIVITAMIN WITH MINERALS) TABS tablet Take 1 tablet by mouth in the morning.     nitroGLYCERIN (NITROSTAT) 0.4 MG SL tablet Place 1 tablet (0.4 mg total) under the tongue every 5 (five) minutes as needed for chest pain. If  pain does not resolve with rest. 50 tablet 3   NP THYROID 60 MG tablet TAKE 1 TABLET BY MOUTH ONCE DAILY BEFORE BREAKFAST 90 tablet 0   olmesartan (BENICAR) 40 MG tablet Take 1 tablet (40 mg total) by mouth daily. (Patient taking differently: Take 40 mg by mouth every evening.) 30 tablet 11   rosuvastatin (CRESTOR) 10 MG tablet TAKE 1 TABLET BY MOUTH EVERY DAY N (Patient taking differently: Take 10 mg by mouth every evening.) 90 tablet 3   thiamine (VITAMIN B-1) 100 MG tablet Take 100 mg by mouth every evening.      ticagrelor (BRILINTA) 90 MG TABS tablet Take 1 tablet (90 mg total) by mouth 2 (two) times daily. 60 tablet 11   Tiotropium Bromide-Olodaterol (STIOLTO RESPIMAT) 2.5-2.5 MCG/ACT AERS Inhale 2 puffs into the lungs daily. 1 each 11   No current facility-administered medications for this visit.  Allergies:  Patient has no known allergies.   Social History: The patient  reports that he quit smoking about 29 years ago. His smoking use included cigarettes. He started smoking about 59 years ago. He has a 60.00 pack-year smoking history. He has quit using smokeless tobacco.  His smokeless tobacco use included snuff. He reports that he does not currently use alcohol after a past usage of about 2.0 - 4.0 standard drinks per week. He reports that he does not use drugs.   Family History: The patient's family history includes Cancer in his mother; Deep vein thrombosis in his father; Heart disease in his father; Pulmonary embolism in his father.   ROS:  Please see the history of present illness. Otherwise, complete review of systems is positive for none.  All other systems are reviewed and negative.   Physical Exam: VS:  BP (!) 158/60   Pulse 74   Ht '5\' 7"'$  (1.702 m)   Wt 162 lb 9.6 oz (73.8 kg)   SpO2 97%   BMI 25.47 kg/m , BMI Body mass index is 25.47 kg/m.  Wt Readings from Last 3 Encounters:  12/13/20 162 lb 9.6 oz (73.8 kg)  12/11/20 164 lb (74.4 kg)  12/06/20 164 lb (74.4 kg)     General: Patient appears comfortable at rest. Neck: Supple, no elevated JVP or carotid bruits, no thyromegaly. Lungs: Clear to auscultation, nonlabored breathing at rest. Cardiac: Regular rate and rhythm, no S3 or significant systolic murmur, no pericardial rub. Extremities: No pitting edema, distal pulses 2+. Skin: Warm and dry.  Large ecchymotic area on anterior thigh and groin area.  1+ femoral pulse. Musculoskeletal: No kyphosis. Neuropsychiatric: Alert and oriented x3, affect grossly appropriate.  ECG:  EKG labs obtained 12/18/2020 normal sinus rhythm rate of 67, possible left atrial enlargement  Recent Labwork: 07/04/2020: ALT 16; AST 16 08/28/2020: TSH 0.84 12/07/2020: BUN 28; Creatinine, Ser 1.29; Hemoglobin 13.8; Platelets 244; Potassium 5.0; Sodium 139     Component Value Date/Time   CHOL 139 07/04/2020 1018   TRIG 128 07/04/2020 1018   HDL 36 (L) 07/04/2020 1018   CHOLHDL 3.9 07/04/2020 1018   VLDL 15 05/19/2015 0224   LDLCALC 81 07/04/2020 1018    Other Studies Reviewed Today:    CORONARY BALLOON ANGIOPLASTY  LEFT HEART CATH AND CORS/GRAFTS ANGIOGRAPHY  12/06/2020 Conclusion      Dist RCA lesion is 70% stenosed.   Ost LM to Mid LM lesion is 99% stenosed.   Ost Cx to Prox Cx lesion is 60% stenosed.   Origin to Prox Graft lesion is 100% stenosed.   Prox LAD lesion is 100% stenosed.   Mid RCA-1 lesion is 99% stenosed.   Mid RCA-2 lesion is 99% stenosed.   Scoring balloon angioplasty was performed using a BALLOON SCOREFLEX 3.0X15.   Post intervention, there is a 80% residual stenosis.   Post intervention, there is a 30% residual stenosis.   SVG graft was visualized by angiography.   LIMA graft was not visualized.   The graft exhibits no disease.   The left ventricular systolic function is normal.   LV end diastolic pressure is normal.   The left ventricular ejection fraction is 55-65% by visual estimate.   There is no mitral valve regurgitation.   Severe three  vessel CAD including severe left main stenosis s/p 2V CABG with 1/2 patent bypass grafts 99% calcified left main stenosis Chronic occlusion proximal LAD. The distal LAD is seen to fill from right to left  collaterals. 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 It is presumed that there is not adequate flow into the LIMA graft given the CTO of the left subclavian artery.  The proximal Circumflex has a moderate stenosis. The vein graft to the obtuse marginal branch is patent.  The RCA is a large dominant artery with serial severe, calcified mid and distal lesions.  Balloon angioplasty and scoring balloon angioplasty of the mid RCA lesions. Unable to deliver stents to the mid RCA lesions despite adequate standard balloon and scoring balloon expansion.  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 Scot Dock. This supplies the LIMA graft which I could not visualize today due to the occlusion of the subclavian artery. I suspect that his RCA disease in the culprit for his recent angina. Attempted PCI today but will need to bring him back in 2 weeks for orbital atherectomy and stenting of the RCA. I have set this up for 12/21/20 at 7:30 am. I have spoken to the [employer] rep who can be here that day. He will continue on ASA and Brilinta. Monitor overnight on telemetry. Plan d/c home tomorrow on ASA/Brilinta if stable.   Diagnostic Dominance: Right Intervention     CT chest without contrast 11/01/2020 IMPRESSION: 1. Mild subpleural reticulation and ground-glass opacity in the periphery of the lung bases, nonspecific. This may be secondary to post infectious/inflammatory scarring, however interstitial lung disease is not excluded. Consider follow-up high-resolution chest CT in 6-12 months to assess for temporal  change. 2. Linear and bandlike opacities within the lingula and both lower lobes typical of atelectasis and/or scarring. No evidence of pneumonia or neoplasm. 3. Moderate emphysema. 4. Patulous esophagus with tiny hiatal hernia. 5. Advanced aortic atherosclerosis. 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 Recommend annual imaging followup by CTA or MRA. This recommendation follows 2010 ACCF/AHA/AATS/ACR/ASA/SCA/SCAI/SIR/STS/SVM Guidelines for the Diagnosis and Management of Patients with Thoracic Aortic Disease. Circulation. 2010; [id number]. Aortic aneurysm NOS (ICD10-I71.9) 6. Dense calcification in the proximal left subclavian likely causes hemodynamic significant stenosis. Post CABG with calcification of native coronary arteries.   Aortic Atherosclerosis (ICD10-I70.0) and Emphysema (ICD10-J43.9).      Echocardiogram 06/22/2019:  1. Left ventricular ejection fraction, by estimation, is 65 to 70%. The  left ventricle has normal function. The left ventricle has no regional  wall motion abnormalities. There is mild left ventricular hypertrophy.  Left ventricular diastolic parameters  are indeterminate.   2. Right ventricular systolic function is normal. The right ventricular  size is normal. There is normal pulmonary artery systolic pressure. The  estimated right ventricular systolic pressure is [id number] mmHg.   3. Left atrial size was upper normal.   4. The mitral valve is grossly normal. Trivial mitral valve  regurgitation.   5. The aortic valve is tricuspid. Aortic valve regurgitation is mild.  Mild to moderate aortic valve sclerosis/calcification is present, without  any evidence of aortic stenosis.   6. The inferior vena cava is normal in size with greater than 50%  respiratory variability, suggesting right atrial pressure of 3 mmHg.    Lexiscan Myoview 05/07/2020: There was no ST segment deviation noted during stress. Findings consistent with prior  anteroseptal/septal myocardial infarction with moderate peri-infarct ischemia. This is an intermediate risk study. Mildly elevated TID of 1.31 may  suggest a component of balanced ischemia and higher risk. The left ventricular ejection fraction is normal (55-65%).   Chest CT 11/01/2020: IMPRESSION: 1. Mild subpleural reticulation and ground-glass opacity in the periphery of the lung bases, nonspecific. This may be secondary to post infectious/inflammatory scarring, however interstitial lung disease is not excluded. Consider follow-up high-resolution chest CT in 6-12 months to assess for temporal change. 2. Linear and bandlike opacities within the lingula and both lower lobes typical of atelectasis and/or scarring. No evidence of pneumonia or neoplasm. 3. Moderate emphysema. 4. Patulous esophagus with tiny hiatal hernia. 5. Advanced aortic atherosclerosis. 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 Recommend annual imaging followup by CTA or MRA. This recommendation follows 2010 ACCF/AHA/AATS/ACR/ASA/SCA/SCAI/SIR/STS/SVM Guidelines for the Diagnosis and Management of Patients with Thoracic Aortic Disease. Circulation. 2010; [id number]. Aortic aneurysm NOS (ICD10-I71.9) 6. Dense calcification in the proximal left subclavian likely causes hemodynamic significant stenosis. Post CABG with calcification of native coronary arteries.     Assessment and Plan:  1. CAD in native artery   2. PAD (peripheral artery disease) ([place])   3. COPD GOLD 2    4. Mixed hyperlipidemia   5. Aortic dilatation (HCC)    1. CAD in native artery Recent cardiac catheterization by Dr. Angelena Form with balloon angioplasty to RCA.  States he is feeling better and has no current chest pain.  No nitroglycerin use.  Continues to have a fairly large ecchymotic area on his left thigh from hematoma after recent cardiac catheterization.  No swelling or drainage noted.  He denies any pain in the groin  area.  Femoral pulse 1+.  He has follow-up PCI on September 23 with Dr. Angelena Form.  Continue Brilinta 90 mg p.o. twice daily.  Continue aspirin 81 mg daily.  Continue Imdur 60 mg p.o. daily.  Continue sublingual nitroglycerin as needed.   2. PAD (peripheral artery disease) ([place]) Denies any claudication on visit today.  He has severe PAD with claudication managed medically.  Recent ABIs significantly abnormal.  ABIs on 10/17/2020: Right resting ABI since with severe right lower extremity arterial disease.  Right toe brachial index was abnormal.  Left resting ankle-brachial index indicates severe left lower extremity arterial disease, left toe brachial index abnormal.  Following with Dr. [name] vascular surgery.  3. COPD GOLD 2 History of COPD.  He sees Dr. Christinia Gully pulmonology.  Had a recent CT of chest secondary to abnormal chest x-ray with lung opacities ordered by Dr. Work.  See results above  4. Mixed hyperlipidemia Continue Crestor 10 mg daily.  Continue fish oil 1000 mg daily.  Lipid panel 07/04/2020: TC 139, HDL 36, TG 128, LDL 81  5.  Fusiform aneurysmal dilatation of ascending aorta Recent chest CT ordered by Dr. Melvyn Novas demonstrated advanced aortic atherosclerosis. 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 Recommend annual imaging followup by CTA or MRA.    Medication Adjustments/Labs and Tests Ordered: Current medicines are reviewed at length with the patient today.  Concerns regarding medicines are outlined above.   Disposition: Follow-up with Dr. Domenic Polite or APP pending coronary intervention  Signed, Levell July, NP 12/13/2020 4:55 PM    [facility] at [place], [place], [place] [address] Phone: [phone]; Fax: [phone]

## 2020-12-13 NOTE — Telephone Encounter (Signed)
Patient's daughter is returning call.

## 2020-12-17 ENCOUNTER — Ambulatory Visit (INDEPENDENT_AMBULATORY_CARE_PROVIDER_SITE_OTHER): Payer: Medicare Other | Admitting: Pharmacist

## 2020-12-17 ENCOUNTER — Other Ambulatory Visit: Payer: Self-pay

## 2020-12-17 DIAGNOSIS — I2510 Atherosclerotic heart disease of native coronary artery without angina pectoris: Secondary | ICD-10-CM

## 2020-12-17 DIAGNOSIS — E782 Mixed hyperlipidemia: Secondary | ICD-10-CM

## 2020-12-17 MED ORDER — REPATHA SURECLICK 140 MG/ML ~~LOC~~ SOAJ: 1.0000 "pen " | SUBCUTANEOUS | 11 refills | Status: DC

## 2020-12-17 MED ORDER — EZETIMIBE 10 MG PO TABS: 10.0000 mg | ORAL_TABLET | Freq: Every day | ORAL | 11 refills | Status: DC

## 2020-12-17 NOTE — Progress Notes (Signed)
Patient ID: William [name].                 DOB: 1941/02/11                    MRN: [id number]     HPI: William [name]. is a 80 y.o. male patient of Dr Domenic Polite referred to lipid clinic by Dr Angelena Form after recent cath. PMH is significant for CAD s/p CABG, unstable angina, HLD, HTN, PVD with claudication, and COPD. Cath 12/06/20 showed severe 3 vessel CAD including severe left main stenosis s/p 2V CABG, 99% calcified left main stenosis, chronically occluded prox LAD, severe stenosis of RCA s/p balloon angioplasty but unable to deliver stents to mid RCA. Scheduled for orbital atherectomy and stenting of RCA on 9/23. Referred to lipid clinic due to intolerance to higher dose statins.  Pt tolerating rosuvastatin '10mg'$  daily ok but having some aches. Previously intolerant to atorvastatin '10mg'$  daily and simvastatin '20mg'$  daily (myalgias). He is agreeable to trying PCSK9i therapy which has been mentioned to him at previous visit. His son and daughter-in-law are both pharmacists, live in [place]. Pt lives alone but would be ok with injectable therapy.  Called his pharmacy for prescription insurance: ID [id number] BIN [id number] PCN IS GRP [id number]  Current Medications: rosuvastatin '10mg'$  daily, OTC fish oil 1g daily Intolerances: atorvastatin '10mg'$  daily, simvastatin '20mg'$  daily Risk Factors: multivessel ASCVD - CABG, severe 3v CAD, PVD with claudication LDL goal: '55mg'$ /dL  Diet: No fried food, avoids bread and soda. Eats chicken, vegetables. Drinks water.  Exercise: Limited due to claudication in his legs from PVD.  Family History: Father with heart disease before age 36, also with PE and DVT. Mother with hx of cancer (sarcoma).  Social History: Former smoker 2 PPD for 30 years, quit in 1993.   Labs: 07/04/20: TC 139, HDL 36, TG 128, LDL 81  Past Medical History:  Diagnosis Date   Carotid artery disease (HCC)    Colon polyp    COPD (chronic obstructive pulmonary disease) (HCC)    Coronary artery  disease    Multivessel status post CABG with LIMA to LAD and SVG to OM1 2017   Hyperlipidemia    Hypertension    Iliac artery aneurysm (HCC)    Myocardial infarction ([place])    Peripheral arterial disease ([place])    Suboptimal revascularization options    Current Outpatient Medications on File Prior to Visit  Medication Sig Dispense Refill   amLODipine (NORVASC) 10 MG tablet Take 1 tablet (10 mg total) by mouth daily. (Patient taking differently: Take 10 mg by mouth every evening.) 90 tablet 3   Ascorbic Acid (VITAMIN C) 1000 MG tablet Take 1,000 mg by mouth in the morning.     aspirin EC 81 MG tablet Take 1 tablet (81 mg total) by mouth daily.     cholecalciferol (VITAMIN D) 25 MCG (1000 UNIT) tablet Take 1,000 Units by mouth in the morning.     fish oil-omega-3 fatty acids 1000 MG capsule Take 1,000 mg by mouth in the morning.     folic acid (FOLVITE) A999333 MCG tablet Take 400 mcg by mouth in the morning.     Garlic [id number] MG TBEC Take 2,000 mg by mouth in the morning.     ibuprofen (ADVIL) 200 MG tablet Take 200 mg by mouth every 8 (eight) hours as needed (pain).     isosorbide mononitrate (IMDUR) 60 MG 24 hr tablet Take 1 tablet (60 mg  total) by mouth daily. (Patient taking differently: Take 60 mg by mouth every evening.) 30 tablet 6   metoprolol tartrate (LOPRESSOR) 50 MG tablet Take 1 tablet (50 mg total) by mouth 2 (two) times daily. 60 tablet 11   Misc. Devices MISC by Does not apply route. CPAP     Multiple Vitamin (MULTIVITAMIN WITH MINERALS) TABS tablet Take 1 tablet by mouth in the morning.     nitroGLYCERIN (NITROSTAT) 0.4 MG SL tablet Place 1 tablet (0.4 mg total) under the tongue every 5 (five) minutes as needed for chest pain. If pain does not resolve with rest. 50 tablet 3   NP THYROID 60 MG tablet TAKE 1 TABLET BY MOUTH ONCE DAILY BEFORE BREAKFAST 90 tablet 0   olmesartan (BENICAR) 40 MG tablet Take 1 tablet (40 mg total) by mouth daily. (Patient taking differently: Take 40 mg by  mouth every evening.) 30 tablet 11   rosuvastatin (CRESTOR) 10 MG tablet TAKE 1 TABLET BY MOUTH EVERY DAY N (Patient taking differently: Take 10 mg by mouth every evening.) 90 tablet 3   thiamine (VITAMIN B-1) 100 MG tablet Take 100 mg by mouth every evening.      ticagrelor (BRILINTA) 90 MG TABS tablet Take 1 tablet (90 mg total) by mouth 2 (two) times daily. 60 tablet 11   Tiotropium Bromide-Olodaterol (STIOLTO RESPIMAT) 2.5-2.5 MCG/ACT AERS Inhale 2 puffs into the lungs daily. 1 each 11   No current facility-administered medications on file prior to visit.    No Known Allergies  Assessment/Plan:  1. Hyperlipidemia - LDL 81 on rosuvastatin '10mg'$  daily, above goal < 55 given progressive ASCVD. Pt intolerant to 2 other statins. PCSK9i would be most effective option to lower his CV risk and bring LDL to goal. Completed prior authorization for Repatha which was approved, copay at pharmacy unfortunately $141 for a 1 month supply. Instead will start ezetimibe '10mg'$  daily. Pt will go to [facility] near his home in 6 weeks to recheck labs. Can try increasing rosuvastatin dose if needed.  [name], PharmD, BCACP, [place] [id number] [address], [place], [place] [address] Phone: [phone]; Fax: [phone] 12/17/2020 2:47 PM

## 2020-12-18 NOTE — Telephone Encounter (Signed)
Reviewed procedure/mask/visitor instructions with patient. 

## 2020-12-20 ENCOUNTER — Encounter: Payer: Self-pay | Admitting: Internal Medicine

## 2020-12-20 ENCOUNTER — Ambulatory Visit (INDEPENDENT_AMBULATORY_CARE_PROVIDER_SITE_OTHER): Payer: Medicare Other | Admitting: Internal Medicine

## 2020-12-20 ENCOUNTER — Other Ambulatory Visit: Payer: Self-pay

## 2020-12-20 DIAGNOSIS — R9389 Abnormal findings on diagnostic imaging of other specified body structures: Secondary | ICD-10-CM | POA: Diagnosis not present

## 2020-12-20 DIAGNOSIS — J449 Chronic obstructive pulmonary disease, unspecified: Secondary | ICD-10-CM | POA: Diagnosis not present

## 2020-12-20 DIAGNOSIS — I200 Unstable angina: Secondary | ICD-10-CM | POA: Diagnosis not present

## 2020-12-20 NOTE — Assessment & Plan Note (Signed)
See cxr 10/11/20 >>  rec CT chest  11/01/20  1. Mild subpleural reticulation and ground-glass opacity in the periphery of the lung bases, nonspecific.   2. Linear and bandlike opacities within the lingula and both lower lobes typical of atelectasis and/or scarring. No evidence of pneumonia or neoplasm. 3. Moderate emphysema. 4. Patulous esophagus with tiny hiatal hernia >>> rec f/u ov with cxr in 88m  No evidence of ca, no real evidence of evolving ILD so rec f/u final cxr in 3 m with pfts as well for baseline         Each maintenance medication was reviewed in detail including emphasizing most importantly the difference between maintenance and prns and under what circumstances the prns are to be triggered using an action plan format where appropriate.  Total time for H and P, chart review, counseling,  and generating customized AVS unique to this office visit / same day charting = 25 min

## 2020-12-20 NOTE — Progress Notes (Signed)
William [name]., male    DOB: July 15, 1940,    MRN: [id number]   Brief patient profile:  85 yowm quit smoking 1993  with GOLD 2 COPD  criteria 05/2015 while on spiriva with reported doe = MMRC 1  but doesn't think the spiriva dpi is working as well and also limited by claudication and sometimes ex cp with known IHD so referred to pulmonary clinic in [place]  10/11/2020 by Dr    Domenic Polite     History of Present Illness  10/11/2020  Pulmonary/ 1st office eval/ [name] / [name]  Chief Complaint  Patient presents with   Consult    Patient has COPD and patient states that he has been having trouble with his heart and was sent here to make sure his lungs are ok. Wife died of lung cancer. Shortness of breath with exertion. Takes Spiriva  Dyspnea:  legs usually stop him 1st x 100 ft  flat and slow    Cough: not much Sleep: flat bed/ one pillow  SABA use: none  Rec Stop spiriva and start Stiolto 2 puffs first thing in Am x 2 weeks then try 1 puff daily x 1 weeks then try nothing to see if it really make a difference Work on inhaler technique: If worse chest pains with activity on the stiolto 2 puffs, can try one puff instead but if it persists, stop stiolto If even the 2 puffs doesn't help your breathing , you need a trial off lisinopril 40 and on benicar 40  for at least 4 weeks - call if you want to try it and I'll call it in for you.     12/20/2020  f/u ov/[place] office/Jaimon Bugaj re: abn cxr/ GOLD 2 copd maint on no inhalers   Chief Complaint  Patient presents with   Follow-up    Sob has increased with exertion. No cough. Chest pains discussed in last OV have gone away.     Dyspnea:  mb x 250 ft to mb stops due legs  Cough: gone  Sleeping: no resp cc on cpap SABA use: none  02: none  Covid status: vax x 3     No obvious day to day or daytime variability or assoc excess/ purulent sputum or mucus plugs or hemoptysis or cp or chest tightness, subjective wheeze or overt sinus  or hb symptoms.   Sleeping as above  without nocturnal  or early am exacerbation  of respiratory  c/o's or need for noct saba. Also denies any obvious fluctuation of symptoms with weather or environmental changes or other aggravating or alleviating factors except as outlined above   No unusual exposure hx or h/o childhood pna/ asthma or knowledge of premature birth.  Current Allergies, Complete Past Medical History, Past Surgical History, Family History, and Social History were reviewed in [employer] record.  ROS  The following are not active complaints unless bolded Hoarseness, sore throat, dysphagia, dental problems, itching, sneezing,  nasal congestion or discharge of excess mucus or purulent secretions, ear ache,   fever, chills, sweats, unintended wt loss or wt gain, classically pleuritic or exertional cp,  orthopnea pnd or arm/hand swelling  or leg swelling, presyncope, palpitations, abdominal pain, anorexia, nausea, vomiting, diarrhea  or change in bowel habits or change in bladder habits, change in stools or change in urine, dysuria, hematuria,  rash, arthralgias, visual complaints, headache, numbness, weakness or ataxia or problems with walking or coordination,  change in mood or  memory.  Current Meds  Medication Sig   amLODipine (NORVASC) 10 MG tablet Take 1 tablet (10 mg total) by mouth daily. (Patient taking differently: Take 10 mg by mouth every evening.)   Ascorbic Acid (VITAMIN C) 1000 MG tablet Take 1,000 mg by mouth in the morning.   aspirin EC 81 MG tablet Take 1 tablet (81 mg total) by mouth daily.   cholecalciferol (VITAMIN D) 25 MCG (1000 UNIT) tablet Take 1,000 Units by mouth in the morning.   ezetimibe (ZETIA) 10 MG tablet Take 1 tablet (10 mg total) by mouth daily.   fish oil-omega-3 fatty acids 1000 MG capsule Take 1,000 mg by mouth in the morning.   folic acid (FOLVITE) 962 MCG tablet Take 400 mcg by mouth in the morning.   Garlic 8366 MG  TBEC Take 2,000 mg by mouth in the morning.   ibuprofen (ADVIL) 200 MG tablet Take 200 mg by mouth every 8 (eight) hours as needed (pain).   isosorbide mononitrate (IMDUR) 60 MG 24 hr tablet Take 1 tablet (60 mg total) by mouth daily. (Patient taking differently: Take 60 mg by mouth every evening.)   metoprolol tartrate (LOPRESSOR) 50 MG tablet Take 1 tablet (50 mg total) by mouth 2 (two) times daily.   Misc. Devices MISC by Does not apply route. CPAP   Multiple Vitamin (MULTIVITAMIN WITH MINERALS) TABS tablet Take 1 tablet by mouth in the morning.   nitroGLYCERIN (NITROSTAT) 0.4 MG SL tablet Place 1 tablet (0.4 mg total) under the tongue every 5 (five) minutes as needed for chest pain. If pain does not resolve with rest.   NP THYROID 60 MG tablet TAKE 1 TABLET BY MOUTH ONCE DAILY BEFORE BREAKFAST   olmesartan (BENICAR) 40 MG tablet Take 1 tablet (40 mg total) by mouth daily. (Patient taking differently: Take 40 mg by mouth every evening.)   rosuvastatin (CRESTOR) 10 MG tablet TAKE 1 TABLET BY MOUTH EVERY DAY N (Patient taking differently: Take 10 mg by mouth every evening.)   thiamine (VITAMIN B-1) 100 MG tablet Take 100 mg by mouth every evening.    ticagrelor (BRILINTA) 90 MG TABS tablet Take 1 tablet (90 mg total) by mouth 2 (two) times daily.   Tiotropium Bromide-Olodaterol (STIOLTO RESPIMAT) 2.5-2.5 MCG/ACT AERS Inhale 2 puffs into the lungs daily.            Objective:       Wt Readings from Last 3 Encounters:  12/20/20 161 lb 1.9 oz (73.1 kg)  12/13/20 162 lb 9.6 oz (73.8 kg)  12/11/20 164 lb (74.4 kg)      Vital signs reviewed  12/20/2020  - Note at rest 02 sats  97% on RA   General appearance:    elderly wm nad     HEENT : pt wearing mask not removed for exam due to covid - 19 concerns.   NECK :  without JVD/Nodes/TM/ nl carotid upstrokes bilaterally   LUNGS: no acc muscle use,  Min barrel  contour chest wall with bilateral  slightly decreased bs s audible wheeze and   without cough on insp or exp maneuvers and min  Hyperresonant  to  percussion bilaterally     CV:  RRR  no s3 or murmur or increase in P2, and no edema   ABD:  soft and nontender with pos end  insp Hoover's  in the supine position. No bruits or organomegaly appreciated, bowel sounds nl  MS:   Nl gait/  ext warm without deformities, calf  tenderness, cyanosis or clubbing No obvious joint restrictions   SKIN: warm and dry without lesions    NEURO:  alert, approp, nl sensorium with  no motor or cerebellar deficits apparent.             I personally reviewed images and agree with radiology impression as follows:  CT chest  11/01/20 s contrast 1. Mild subpleural reticulation and ground-glass opacity in the periphery of the lung bases, nonspecific.   2. Linear and bandlike opacities within the lingula and both lower lobes typical of atelectasis and/or scarring. No evidence of pneumonia or neoplasm. 3. Moderate emphysema. 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

## 2020-12-20 NOTE — Addendum Note (Signed)
Addended by: Fritzi Mandes D on: 12/20/2020 04:31 PM   Modules accepted: Orders

## 2020-12-20 NOTE — Assessment & Plan Note (Signed)
Quit smoking 1993 - Spirometry 05/21/2015   FEV1 1.95 (74%)  Ratio 0.66? p spiriva  10/11/2020   Walked RA  approx  358ft  @ nl pace  stopped due to  Leg pain, no sob and sats 95% ; - 10/11/2020  After extensive coaching inhaler device,  effectiveness =    90% try stiolto 2 puffs each am  > no better doe so stopped all inhalers  Likely so limited by legs he doesn't hit a high enough ventilatory demand to be limited by sob so no need for meds, return for pfts in 3 m for baseline

## 2020-12-20 NOTE — Patient Instructions (Signed)
No change in medications   Please schedule a follow up visit in 3 months PFTs and we'll do final chest xray then

## 2020-12-21 ENCOUNTER — Ambulatory Visit (HOSPITAL_BASED_OUTPATIENT_CLINIC_OR_DEPARTMENT_OTHER): Payer: Medicare Other

## 2020-12-21 ENCOUNTER — Encounter (HOSPITAL_COMMUNITY): Payer: Self-pay | Admitting: Internal Medicine

## 2020-12-21 ENCOUNTER — Encounter (HOSPITAL_COMMUNITY)
Admission: RE | Disposition: A | Discharge status: Home / Self Care | Payer: Self-pay | Source: Home / Self Care | Attending: Internal Medicine

## 2020-12-21 ENCOUNTER — Other Ambulatory Visit: Payer: Self-pay

## 2020-12-21 ENCOUNTER — Ambulatory Visit (HOSPITAL_COMMUNITY)
Admission: RE | Admit: 2020-12-21 | Discharge: 2020-12-22 | Disposition: A | Discharge status: Home / Self Care | Payer: Medicare Other | Attending: Internal Medicine | Admitting: Internal Medicine

## 2020-12-21 DIAGNOSIS — Z8249 Family history of ischemic heart disease and other diseases of the circulatory system: Secondary | ICD-10-CM | POA: Diagnosis not present

## 2020-12-21 DIAGNOSIS — Z87891 Personal history of nicotine dependence: Secondary | ICD-10-CM | POA: Diagnosis not present

## 2020-12-21 DIAGNOSIS — Z23 Encounter for immunization: Secondary | ICD-10-CM | POA: Diagnosis not present

## 2020-12-21 DIAGNOSIS — Z951 Presence of aortocoronary bypass graft: Secondary | ICD-10-CM | POA: Insufficient documentation

## 2020-12-21 DIAGNOSIS — I712 Thoracic aortic aneurysm, without rupture: Secondary | ICD-10-CM | POA: Insufficient documentation

## 2020-12-21 DIAGNOSIS — I252 Old myocardial infarction: Secondary | ICD-10-CM | POA: Insufficient documentation

## 2020-12-21 DIAGNOSIS — E782 Mixed hyperlipidemia: Secondary | ICD-10-CM | POA: Insufficient documentation

## 2020-12-21 DIAGNOSIS — I200 Unstable angina: Secondary | ICD-10-CM | POA: Diagnosis present

## 2020-12-21 DIAGNOSIS — I2584 Coronary atherosclerosis due to calcified coronary lesion: Secondary | ICD-10-CM | POA: Insufficient documentation

## 2020-12-21 DIAGNOSIS — I080 Rheumatic disorders of both mitral and aortic valves: Secondary | ICD-10-CM | POA: Diagnosis not present

## 2020-12-21 DIAGNOSIS — Z79890 Hormone replacement therapy (postmenopausal): Secondary | ICD-10-CM | POA: Diagnosis not present

## 2020-12-21 DIAGNOSIS — J449 Chronic obstructive pulmonary disease, unspecified: Secondary | ICD-10-CM | POA: Insufficient documentation

## 2020-12-21 DIAGNOSIS — I25119 Atherosclerotic heart disease of native coronary artery with unspecified angina pectoris: Secondary | ICD-10-CM | POA: Diagnosis not present

## 2020-12-21 DIAGNOSIS — Z79899 Other long term (current) drug therapy: Secondary | ICD-10-CM | POA: Diagnosis not present

## 2020-12-21 DIAGNOSIS — J439 Emphysema, unspecified: Secondary | ICD-10-CM | POA: Diagnosis present

## 2020-12-21 DIAGNOSIS — I10 Essential (primary) hypertension: Secondary | ICD-10-CM | POA: Diagnosis not present

## 2020-12-21 DIAGNOSIS — I2510 Atherosclerotic heart disease of native coronary artery without angina pectoris: Secondary | ICD-10-CM

## 2020-12-21 DIAGNOSIS — Z7982 Long term (current) use of aspirin: Secondary | ICD-10-CM | POA: Diagnosis not present

## 2020-12-21 DIAGNOSIS — I209 Angina pectoris, unspecified: Secondary | ICD-10-CM | POA: Diagnosis present

## 2020-12-21 DIAGNOSIS — I779 Disorder of arteries and arterioles, unspecified: Secondary | ICD-10-CM | POA: Diagnosis not present

## 2020-12-21 DIAGNOSIS — I739 Peripheral vascular disease, unspecified: Secondary | ICD-10-CM | POA: Insufficient documentation

## 2020-12-21 DIAGNOSIS — Z955 Presence of coronary angioplasty implant and graft: Secondary | ICD-10-CM

## 2020-12-21 DIAGNOSIS — E785 Hyperlipidemia, unspecified: Secondary | ICD-10-CM | POA: Diagnosis present

## 2020-12-21 HISTORY — PX: CORONARY STENT INTERVENTION: CATH118234

## 2020-12-21 HISTORY — PX: CORONARY ULTRASOUND/IVUS: CATH118244

## 2020-12-21 HISTORY — PX: INTRAVASCULAR ULTRASOUND/IVUS: CATH118244

## 2020-12-21 HISTORY — PX: CORONARY ATHERECTOMY: CATH118238

## 2020-12-21 LAB — ECHOCARDIOGRAM LIMITED
Height: 66 in
Weight: 2592 oz

## 2020-12-21 LAB — BASIC METABOLIC PANEL
Anion gap: 10 (ref 5–15)
BUN: 35 mg/dL — ABNORMAL HIGH (ref 8–23)
CO2: 18 mmol/L — ABNORMAL LOW (ref 22–32)
Calcium: 9.6 mg/dL (ref 8.9–10.3)
Chloride: 109 mmol/L (ref 98–111)
Creatinine, Ser: 1.26 mg/dL — ABNORMAL HIGH (ref 0.61–1.24)
GFR, Estimated: 58 mL/min — ABNORMAL LOW (ref >60)
Glucose, Bld: 113 mg/dL — ABNORMAL HIGH (ref 70–99)
Potassium: 5 mmol/L (ref 3.5–5.1)
Sodium: 137 mmol/L (ref 135–145)

## 2020-12-21 LAB — CBC
HCT: 47.5 % (ref 39.0–52.0)
Hemoglobin: 16 g/dL (ref 13.0–17.0)
MCH: 31.7 pg (ref 26.0–34.0)
MCHC: 33.7 g/dL (ref 30.0–36.0)
MCV: 94.1 fL (ref 80.0–100.0)
Platelets: 314 10*3/uL (ref 150–400)
RBC: 5.05 MIL/uL (ref 4.22–5.81)
RDW: 13.7 % (ref 11.5–15.5)
WBC: 9.3 10*3/uL (ref 4.0–10.5)
nRBC: 0 % (ref 0.0–0.2)

## 2020-12-21 LAB — POCT ACTIVATED CLOTTING TIME
Activated Clotting Time: 306 seconds
Activated Clotting Time: 457 seconds
Activated Clotting Time: 248 seconds
Activated Clotting Time: 353 seconds
Activated Clotting Time: 231 seconds

## 2020-12-21 SURGERY — CORONARY STENT INTERVENTION
Anesthesia: LOCAL

## 2020-12-21 MED ORDER — SODIUM CHLORIDE 0.9% FLUSH
3.0000 mL | Freq: Two times a day (BID) | INTRAVENOUS | Status: DC
  Administered 2020-12-21 – 2020-12-22 (×3): 3 mL via INTRAVENOUS

## 2020-12-21 MED ORDER — MIDAZOLAM HCL 2 MG/2ML IJ SOLN
INTRAMUSCULAR | Status: AC
  Filled 2020-12-21: qty 2

## 2020-12-21 MED ORDER — SODIUM CHLORIDE 0.9% FLUSH: 3.0000 mL | INTRAVENOUS | Status: DC | PRN

## 2020-12-21 MED ORDER — ISOSORBIDE MONONITRATE ER 60 MG PO TB24
60.0000 mg | ORAL_TABLET | Freq: Every day | ORAL | Status: DC
  Administered 2020-12-22: 60 mg via ORAL
  Filled 2020-12-21: qty 1

## 2020-12-21 MED ORDER — GARLIC 2000 MG PO TBEC: 2000.0000 mg | DELAYED_RELEASE_TABLET | Freq: Every morning | ORAL | Status: DC

## 2020-12-21 MED ORDER — ARFORMOTEROL TARTRATE 15 MCG/2ML IN NEBU
15.0000 ug | INHALATION_SOLUTION | Freq: Two times a day (BID) | RESPIRATORY_TRACT | Status: DC
  Filled 2020-12-21 (×2): qty 2

## 2020-12-21 MED ORDER — OMEGA-3 FATTY ACIDS 1000 MG PO CAPS: 1000.0000 mg | ORAL_CAPSULE | Freq: Every morning | ORAL | Status: DC

## 2020-12-21 MED ORDER — FENTANYL CITRATE (PF) 100 MCG/2ML IJ SOLN
INTRAMUSCULAR | Status: AC
  Filled 2020-12-21: qty 2

## 2020-12-21 MED ORDER — FENTANYL CITRATE (PF) 100 MCG/2ML IJ SOLN
INTRAMUSCULAR | Status: DC | PRN
  Administered 2020-12-21 (×3): 25 ug via INTRAVENOUS

## 2020-12-21 MED ORDER — SODIUM CHLORIDE 0.9 % IV SOLN: INTRAVENOUS | Status: AC

## 2020-12-21 MED ORDER — THYROID 60 MG PO TABS
60.0000 mg | ORAL_TABLET | Freq: Every day | ORAL | Status: DC
  Administered 2020-12-22: 60 mg via ORAL
  Filled 2020-12-21: qty 1

## 2020-12-21 MED ORDER — TICAGRELOR 90 MG PO TABS: 90.0000 mg | ORAL_TABLET | ORAL | Status: DC

## 2020-12-21 MED ORDER — VERAPAMIL HCL 2.5 MG/ML IV SOLN
INTRAVENOUS | Status: DC | PRN
  Administered 2020-12-21: 10 mL via INTRA_ARTERIAL

## 2020-12-21 MED ORDER — VERAPAMIL HCL 2.5 MG/ML IV SOLN
INTRAVENOUS | Status: AC
  Filled 2020-12-21: qty 2

## 2020-12-21 MED ORDER — IRBESARTAN 75 MG PO TABS
37.5000 mg | ORAL_TABLET | Freq: Every day | ORAL | Status: DC
  Administered 2020-12-22: 37.5 mg via ORAL
  Filled 2020-12-21: qty 1

## 2020-12-21 MED ORDER — SODIUM CHLORIDE 0.9 % IV SOLN: 250.0000 mL | INTRAVENOUS | Status: DC | PRN

## 2020-12-21 MED ORDER — ROSUVASTATIN CALCIUM 5 MG PO TABS
10.0000 mg | ORAL_TABLET | Freq: Every evening | ORAL | Status: DC
  Filled 2020-12-21: qty 1

## 2020-12-21 MED ORDER — HYDRALAZINE HCL 20 MG/ML IJ SOLN: 10.0000 mg | INTRAMUSCULAR | Status: AC | PRN

## 2020-12-21 MED ORDER — MIDAZOLAM HCL 2 MG/2ML IJ SOLN
INTRAMUSCULAR | Status: DC | PRN
  Administered 2020-12-21 (×3): 1 mg via INTRAVENOUS

## 2020-12-21 MED ORDER — LIDOCAINE HCL (PF) 1 % IJ SOLN
INTRAMUSCULAR | Status: DC | PRN
  Administered 2020-12-21: 2 mL

## 2020-12-21 MED ORDER — LIDOCAINE HCL (PF) 1 % IJ SOLN
INTRAMUSCULAR | Status: AC
  Filled 2020-12-21: qty 30

## 2020-12-21 MED ORDER — UMECLIDINIUM BROMIDE 62.5 MCG/INH IN AEPB
1.0000 | INHALATION_SPRAY | Freq: Every day | RESPIRATORY_TRACT | Status: DC
  Filled 2020-12-21: qty 7

## 2020-12-21 MED ORDER — ASPIRIN 81 MG PO CHEW: 81.0000 mg | CHEWABLE_TABLET | ORAL | Status: DC

## 2020-12-21 MED ORDER — HEPARIN SODIUM (PORCINE) 1000 UNIT/ML IJ SOLN
INTRAMUSCULAR | Status: AC
  Filled 2020-12-21: qty 1

## 2020-12-21 MED ORDER — HEPARIN SODIUM (PORCINE) 1000 UNIT/ML IJ SOLN
INTRAMUSCULAR | Status: DC | PRN
  Administered 2020-12-21: 3000 [IU] via INTRAVENOUS
  Administered 2020-12-21: 2000 [IU] via INTRAVENOUS
  Administered 2020-12-21: 1000 [IU] via INTRAVENOUS
  Administered 2020-12-21: 5000 [IU] via INTRAVENOUS

## 2020-12-21 MED ORDER — SODIUM CHLORIDE 0.9 % IV SOLN
5.0000 mg/kg | INTRAVENOUS | Status: AC
  Administered 2020-12-21: 367.5 mg via INTRAVENOUS
  Filled 2020-12-21: qty 14.7

## 2020-12-21 MED ORDER — NITROGLYCERIN 1 MG/10 ML FOR IR/CATH LAB
INTRA_ARTERIAL | Status: DC | PRN
  Administered 2020-12-21: 100 ug via INTRACORONARY

## 2020-12-21 MED ORDER — SODIUM CHLORIDE 0.9% FLUSH
3.0000 mL | Freq: Two times a day (BID) | INTRAVENOUS | Status: DC
  Administered 2020-12-21 (×2): 3 mL via INTRAVENOUS

## 2020-12-21 MED ORDER — NITROGLYCERIN 1 MG/10 ML FOR IR/CATH LAB
INTRA_ARTERIAL | Status: AC
  Filled 2020-12-21: qty 10

## 2020-12-21 MED ORDER — SODIUM CHLORIDE 0.9 % WEIGHT BASED INFUSION
3.0000 mL/kg/h | INTRAVENOUS | Status: AC
  Administered 2020-12-21: 3 mL/kg/h via INTRAVENOUS

## 2020-12-21 MED ORDER — OMEGA-3-ACID ETHYL ESTERS 1 G PO CAPS
1.0000 g | ORAL_CAPSULE | Freq: Every day | ORAL | Status: DC
  Administered 2020-12-22: 1 g via ORAL
  Filled 2020-12-21: qty 1

## 2020-12-21 MED ORDER — SODIUM CHLORIDE 0.9 % WEIGHT BASED INFUSION: 1.0000 mL/kg/h | INTRAVENOUS | Status: DC

## 2020-12-21 MED ORDER — TICAGRELOR 90 MG PO TABS
90.0000 mg | ORAL_TABLET | Freq: Two times a day (BID) | ORAL | Status: DC
  Administered 2020-12-21 – 2020-12-22 (×2): 90 mg via ORAL
  Filled 2020-12-21 (×2): qty 1

## 2020-12-21 MED ORDER — FOLIC ACID 1 MG PO TABS
1000.0000 ug | ORAL_TABLET | Freq: Every morning | ORAL | Status: DC
  Administered 2020-12-22: 1 mg via ORAL
  Filled 2020-12-21: qty 1

## 2020-12-21 MED ORDER — ONDANSETRON HCL 4 MG/2ML IJ SOLN: 4.0000 mg | Freq: Four times a day (QID) | INTRAMUSCULAR | Status: DC | PRN

## 2020-12-21 MED ORDER — METOPROLOL TARTRATE 50 MG PO TABS
50.0000 mg | ORAL_TABLET | Freq: Two times a day (BID) | ORAL | Status: DC
  Administered 2020-12-21 – 2020-12-22 (×2): 50 mg via ORAL
  Filled 2020-12-21 (×3): qty 1

## 2020-12-21 MED ORDER — ASCORBIC ACID 500 MG PO TABS
1000.0000 mg | ORAL_TABLET | Freq: Every morning | ORAL | Status: DC
  Administered 2020-12-22: 1000 mg via ORAL
  Filled 2020-12-21: qty 2

## 2020-12-21 MED ORDER — ASPIRIN EC 81 MG PO TBEC
81.0000 mg | DELAYED_RELEASE_TABLET | Freq: Every day | ORAL | Status: DC
  Administered 2020-12-22: 81 mg via ORAL
  Filled 2020-12-21: qty 1

## 2020-12-21 MED ORDER — HEPARIN (PORCINE) IN NACL 1000-0.9 UT/500ML-% IV SOLN
INTRAVENOUS | Status: AC
  Filled 2020-12-21: qty 1000

## 2020-12-21 MED ORDER — VIPERSLIDE LUBRICANT OPTIME: TOPICAL | Status: DC | PRN

## 2020-12-21 MED ORDER — HEPARIN (PORCINE) IN NACL 1000-0.9 UT/500ML-% IV SOLN
INTRAVENOUS | Status: DC | PRN
  Administered 2020-12-21 (×3): 500 mL

## 2020-12-21 MED ORDER — EZETIMIBE 10 MG PO TABS
10.0000 mg | ORAL_TABLET | Freq: Every day | ORAL | Status: DC
  Administered 2020-12-22: 10 mg via ORAL
  Filled 2020-12-21: qty 1

## 2020-12-21 MED ORDER — VITAMIN B-1 100 MG PO TABS
100.0000 mg | ORAL_TABLET | Freq: Every evening | ORAL | Status: DC
  Filled 2020-12-21: qty 1

## 2020-12-21 MED ORDER — AMLODIPINE BESYLATE 5 MG PO TABS
10.0000 mg | ORAL_TABLET | Freq: Every day | ORAL | Status: DC
  Administered 2020-12-22: 10 mg via ORAL
  Filled 2020-12-21: qty 2

## 2020-12-21 MED ORDER — ADULT MULTIVITAMIN W/MINERALS CH
1.0000 | ORAL_TABLET | Freq: Every morning | ORAL | Status: DC
  Administered 2020-12-22: 1 via ORAL
  Filled 2020-12-21: qty 1

## 2020-12-21 MED ORDER — IBUPROFEN 200 MG PO TABS: 200.0000 mg | ORAL_TABLET | Freq: Three times a day (TID) | ORAL | Status: DC | PRN

## 2020-12-21 MED ORDER — NITROGLYCERIN 0.4 MG SL SUBL: 0.4000 mg | SUBLINGUAL_TABLET | SUBLINGUAL | Status: DC | PRN

## 2020-12-21 MED ORDER — VITAMIN D 25 MCG (1000 UNIT) PO TABS
1000.0000 [IU] | ORAL_TABLET | Freq: Every morning | ORAL | Status: DC
  Administered 2020-12-22: 1000 [IU] via ORAL
  Filled 2020-12-21: qty 1

## 2020-12-21 MED ORDER — IOHEXOL 350 MG/ML SOLN
INTRAVENOUS | Status: DC | PRN
  Administered 2020-12-21: 190 mL

## 2020-12-21 MED ORDER — LABETALOL HCL 5 MG/ML IV SOLN: 10.0000 mg | INTRAVENOUS | Status: AC | PRN

## 2020-12-21 MED ORDER — INFLUENZA VAC A&B SA ADJ QUAD 0.5 ML IM PRSY
0.5000 mL | PREFILLED_SYRINGE | INTRAMUSCULAR | Status: AC
  Administered 2020-12-22: 0.5 mL via INTRAMUSCULAR
  Filled 2020-12-21 (×2): qty 0.5

## 2020-12-21 MED ORDER — ACETAMINOPHEN 325 MG PO TABS: 650.0000 mg | ORAL_TABLET | ORAL | Status: DC | PRN

## 2020-12-21 SURGICAL SUPPLY — 31 items
BALLN SAPPHIRE 3.0X12 (BALLOONS) ×4
BALLN SAPPHIRE ~~LOC~~ 4.0X15 (BALLOONS) IMPLANT
BALLN ~~LOC~~ EMERGE MR 4.0X15 (BALLOONS) ×2
BALLN ~~LOC~~ EUPHORA RX 4.0X15 (BALLOONS) ×2
BALLOON SAPPHIRE 3.0X12 (BALLOONS) IMPLANT
BALLOON ~~LOC~~ EMERGE MR 4.0X15 (BALLOONS) IMPLANT
BALLOON ~~LOC~~ EUPHORA RX 4.0X15 (BALLOONS) IMPLANT
CATH LAUNCHER 6FR AL.75 (CATHETERS) ×1 IMPLANT
CATH LAUNCHER 6FR JR4 (CATHETERS) ×1 IMPLANT
CATH TELEPORT (CATHETERS) ×1 IMPLANT
CROWN DIAMONDBACK CLASSIC 1.25 (BURR) ×1 IMPLANT
DEVICE RAD COMP TR BAND LRG (VASCULAR PRODUCTS) ×1 IMPLANT
GLIDESHEATH SLEND SS 6F .021 (SHEATH) ×1 IMPLANT
GUIDELINER 6F (CATHETERS) ×1 IMPLANT
GUIDEWIRE INQWIRE 1.5J.035X260 (WIRE) IMPLANT
INQWIRE 1.5J .035X260CM (WIRE) ×2
KIT ENCORE 26 ADVANTAGE (KITS) ×1 IMPLANT
KIT HEART LEFT (KITS) ×3 IMPLANT
KIT HEMO VALVE WATCHDOG (MISCELLANEOUS) ×1 IMPLANT
LUBRICANT VIPERSLIDE CORONARY (MISCELLANEOUS) ×1 IMPLANT
PACK CARDIAC CATHETERIZATION (CUSTOM PROCEDURE TRAY) ×3 IMPLANT
SHEATH PROBE COVER 6X72 (BAG) ×1 IMPLANT
STENT ONYX FRONTIER 3.5X34 (Permanent Stent) ×1 IMPLANT
STENT ONYX FRONTIER 3.5X38 (Permanent Stent) ×1 IMPLANT
STENT ONYX FRONTIER 4.0X22 (Permanent Stent) ×1 IMPLANT
TRANSDUCER W/STOPCOCK (MISCELLANEOUS) ×3 IMPLANT
TUBING CIL FLEX 10 FLL-RA (TUBING) ×3 IMPLANT
WIRE ASAHI PROWATER 180CM (WIRE) ×1 IMPLANT
WIRE ASAHI PROWATER 300CM (WIRE) ×1 IMPLANT
WIRE EMERALD 3MM-J .035X150CM (WIRE) ×1 IMPLANT
WIRE VIPERWIRE COR FLEX .012 (WIRE) ×1 IMPLANT

## 2020-12-21 NOTE — Interval H&P Note (Signed)
History and Physical Interval Note:  12/21/2020 7:28 AM  William [name].  has presented today for surgery, with the diagnosis of cad.  The various methods of treatment have been discussed with the patient and family. After consideration of risks, benefits and other options for treatment, the patient has consented to  Procedure(s): CORONARY STENT INTERVENTION (N/A) as a surgical intervention.  The patient's history has been reviewed, patient examined, no change in status, stable for surgery.  I have reviewed the patient's chart and labs.  Questions were answered to the patient's satisfaction.    Cath Lab Visit (complete for each Cath Lab visit)  Clinical Evaluation Leading to the Procedure:   ACS: No.  Non-ACS:    Anginal Classification: CCS III  Anti-ischemic medical therapy: Maximal Therapy (2 or more classes of medications)  Non-Invasive Test Results: No non-invasive testing performed  Prior CABG: Previous CABG        William [name]

## 2020-12-21 NOTE — Progress Notes (Signed)
   12/21/20 1309  Assess: MEWS Score  Temp 97.7 F (36.5 C)  BP (!) 80/54  Pulse Rate 83  ECG Heart Rate 81  Resp 20  Level of Consciousness Alert  SpO2 97 %  O2 Device Room Air  Assess: MEWS Score  MEWS Temp 0  MEWS Systolic 2  MEWS Pulse 0  MEWS RR 0  MEWS LOC 0  MEWS Score 2  MEWS Score Color Yellow  Assess: if the MEWS score is Yellow or Red  Were vital signs taken at a resting state? Yes  Focused Assessment No change from prior assessment  Early Detection of Sepsis Score *See Row Information* Low  MEWS guidelines implemented *See Row Information* Yes  Treat  Pain Scale 0-10  Take Vital Signs  Increase Vital Sign Frequency  Yellow: Q 2hr X 2 then Q 4hr X 2, if remains yellow, continue Q 4hrs  Escalate  MEWS: Escalate Yellow: discuss with charge nurse/RN and consider discussing with provider and RRT  Notify: Charge Nurse/RN  Name of Charge Nurse/RN Notified Yoko  Date Charge Nurse/RN Notified 12/21/20  Time Charge Nurse/RN Notified 1351  Document  Patient Outcome Other (Comment) (stable in room)  Progress note created (see row info) Yes

## 2020-12-21 NOTE — Progress Notes (Signed)
Echocardiogram 2D Echocardiogram has been performed.  Oneal Deputy Japneet Staggs RDCS 12/21/2020, 10:33 AM

## 2020-12-22 ENCOUNTER — Ambulatory Visit (HOSPITAL_BASED_OUTPATIENT_CLINIC_OR_DEPARTMENT_OTHER): Payer: Medicare Other

## 2020-12-22 DIAGNOSIS — I712 Thoracic aortic aneurysm, without rupture: Secondary | ICD-10-CM | POA: Diagnosis not present

## 2020-12-22 DIAGNOSIS — Z23 Encounter for immunization: Secondary | ICD-10-CM | POA: Diagnosis not present

## 2020-12-22 DIAGNOSIS — I252 Old myocardial infarction: Secondary | ICD-10-CM | POA: Diagnosis not present

## 2020-12-22 DIAGNOSIS — I10 Essential (primary) hypertension: Secondary | ICD-10-CM | POA: Diagnosis not present

## 2020-12-22 DIAGNOSIS — I200 Unstable angina: Secondary | ICD-10-CM | POA: Diagnosis not present

## 2020-12-22 DIAGNOSIS — Z87891 Personal history of nicotine dependence: Secondary | ICD-10-CM | POA: Diagnosis not present

## 2020-12-22 DIAGNOSIS — J449 Chronic obstructive pulmonary disease, unspecified: Secondary | ICD-10-CM | POA: Diagnosis not present

## 2020-12-22 DIAGNOSIS — E782 Mixed hyperlipidemia: Secondary | ICD-10-CM | POA: Diagnosis not present

## 2020-12-22 DIAGNOSIS — I739 Peripheral vascular disease, unspecified: Secondary | ICD-10-CM | POA: Diagnosis not present

## 2020-12-22 DIAGNOSIS — I2584 Coronary atherosclerosis due to calcified coronary lesion: Secondary | ICD-10-CM | POA: Diagnosis not present

## 2020-12-22 DIAGNOSIS — I080 Rheumatic disorders of both mitral and aortic valves: Secondary | ICD-10-CM | POA: Diagnosis not present

## 2020-12-22 DIAGNOSIS — I25119 Atherosclerotic heart disease of native coronary artery with unspecified angina pectoris: Secondary | ICD-10-CM | POA: Diagnosis not present

## 2020-12-22 DIAGNOSIS — I779 Disorder of arteries and arterioles, unspecified: Secondary | ICD-10-CM | POA: Diagnosis not present

## 2020-12-22 LAB — CBC
HCT: 40.4 % (ref 39.0–52.0)
Hemoglobin: 13.6 g/dL (ref 13.0–17.0)
MCH: 31.3 pg (ref 26.0–34.0)
MCHC: 33.7 g/dL (ref 30.0–36.0)
MCV: 92.9 fL (ref 80.0–100.0)
Platelets: 262 10*3/uL (ref 150–400)
RBC: 4.35 MIL/uL (ref 4.22–5.81)
RDW: 13.7 % (ref 11.5–15.5)
WBC: 9.2 10*3/uL (ref 4.0–10.5)
nRBC: 0 % (ref 0.0–0.2)

## 2020-12-22 LAB — BASIC METABOLIC PANEL
Anion gap: 6 (ref 5–15)
BUN: 30 mg/dL — ABNORMAL HIGH (ref 8–23)
CO2: 19 mmol/L — ABNORMAL LOW (ref 22–32)
Calcium: 8.7 mg/dL — ABNORMAL LOW (ref 8.9–10.3)
Chloride: 111 mmol/L (ref 98–111)
Creatinine, Ser: 1.25 mg/dL — ABNORMAL HIGH (ref 0.61–1.24)
GFR, Estimated: 59 mL/min — ABNORMAL LOW (ref >60)
Glucose, Bld: 99 mg/dL (ref 70–99)
Potassium: 5 mmol/L (ref 3.5–5.1)
Sodium: 136 mmol/L (ref 135–145)

## 2020-12-22 LAB — LIPID PANEL
Cholesterol: 96 mg/dL (ref 0–200)
HDL: 31 mg/dL — ABNORMAL LOW (ref >40)
LDL Cholesterol: 39 mg/dL (ref 0–99)
Total CHOL/HDL Ratio: 3.1 RATIO
Triglycerides: 130 mg/dL (ref <150)
VLDL: 26 mg/dL (ref 0–40)

## 2020-12-22 LAB — ECHOCARDIOGRAM COMPLETE
Area-P 1/2: 4.8 cm2
Height: 66 in
S' Lateral: 2.8 cm
Weight: 2524.8 oz

## 2020-12-22 MED ORDER — OMEGA-3-ACID ETHYL ESTERS 1 G PO CAPS: 1.0000 g | ORAL_CAPSULE | Freq: Every day | ORAL | 2 refills | Status: DC

## 2020-12-22 NOTE — Progress Notes (Signed)
CARDIAC REHAB PHASE I   Stent education completed with pt. Pt educated on importance of ASA and Brilinta. Reviewed site care, restrictions, exercise guidelines, and diet. Will re-refer to CRP II [place].  [id number] Rufina Falco, RN BSN 12/22/2020 8:11 AM

## 2020-12-22 NOTE — Progress Notes (Signed)
  Echocardiogram 2D Echocardiogram has been performed.  Matilde Bash 12/22/2020, 11:21 AM

## 2020-12-22 NOTE — Discharge Summary (Signed)
Discharge Summary    Patient ID: William [name]. MRN: [id number]; DOB: 08-10-1940  Admit date: 12/21/2020 Discharge date: 12/22/2020  PCP:  Pcp, No   [facility] Providers Cardiologist:  Rozann Lesches, MD }  Discharge Diagnoses    Principal Problem:   Unstable angina [facility]) Active Problems:   Hyperlipidemia   Essential hypertension   Peripheral vascular disease, unspecified ([place])   S/P CABG x 2   COPD GOLD 2    Angina pectoris ([place])  Diagnostic Studies/Procedures    Stent intervention 12/21/20:    Mid RCA-1 lesion is 80% stenosed.   Mid RCA-2 lesion is 30% stenosed.   Dist RCA lesion is 70% stenosed.   Prox RCA lesion is 65% stenosed.   A stent was successfully placed.   A stent was successfully placed.   A stent was successfully placed.   Post intervention, there is a 0% residual stenosis.   Post intervention, there is a 0% residual stenosis.   Post intervention, there is a 0% residual stenosis.   Post intervention, there is a 0% residual stenosis.   PCI of proximal to distal RCA with three overlapping drug-eluting stents using orbital athrectomy.  A small perforation was seen in the mid vessel that did not improve with prolonged balloon inflation and the patient was stable throughout the entire procedure.  Limited echocardiographic images at the conclusion of the case demonstrated no significant effusion.  The patient will be observed overnight and repeat echocardiogram obtained.  Diagnostic Dominance: Right Intervention     ___________  Limited echocardiogram 12/21/20:    1. Left ventricular ejection fraction, by estimation, is 65 to 70%. The  left ventricle has normal function.   Comparison(s): No significant change from prior   Complete echocardiogram 12/22/20:  Completed however not yet formally read     History of Present Illness     William [name]. is a 80 y.o. male with a history of angina, HTN, NSTEMI/carotid artery disease, HLD, HTN,  COPD, PAD and Iliac artery aneurysm.    He was recently seen by Dr Domenic Polite on 12/04/2020 at which he continued to have anginal symptoms in spite of medical therapy. Dr. Domenic Polite scheduled him for a cardiac catheterization.    He was admitted and had cardiac catheterization on 12/06/2021 by Dr Angelena Form. Cardiac catheterization showed severe complex three-vessel disease. 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  It was presumed there was not adequate flow to the LIMA with CTO of the left subclavian. Attempted balloon angioplasty of the mid RCA lesion but unable to deliver stents. He was placed on DAPT with aspirin and Brilinta with plans to bring back on 12/21/2020 for orbital atherectomy and stenting of RCA.  He was discharged on aspirin, Brilinta, Crestor 10 mg daily, Imdur 60 mg daily, metoprolol 25 mg p.o. twice daily increased to 50 mg p.o. twice daily.  Amlodipine 10 mg daily and olmesartan 40 mg daily.     Hospital Course     He then presented 12/21/20 for elective arthrectomy and stent intervention of the proximal/distal RCA with three overlapping DES stents. There was a small perforation seen in the mid vessel that did not improve with prolonged balloon inflation and the patient was stable throughout the entire procedure. Limited echocardiographic images at the conclusion of the case demonstrated no significant effusion. The patient was observed overnight and repeat echocardiogram obtained 12/22/20. This has been performed however not yet formally read. Dr. Quentin Ore  reviewed imagining and reports no significant effusion. He will be continued on his current medical plan. Follow up has been made. The patient has walked with cardiac rehab with no issues. Groin sites are stable with no bleeding or hematoma.   Consultants: None    The patient was seen by Dr. Quentin Ore who feels that the patient is stable and ready for discharge today, 12/22/20.   Did the  patient have an acute coronary syndrome (MI, NSTEMI, STEMI, etc) this admission?:  No                               Did the patient have a percutaneous coronary intervention (stent / angioplasty)?:  Yes.     Cath/PCI Registry Performance & Quality Measures: Aspirin prescribed? - Yes ADP Receptor Inhibitor (Plavix/Clopidogrel, Brilinta/Ticagrelor or Effient/Prasugrel) prescribed (includes medically managed patients)? - Yes High Intensity Statin (Lipitor 40-80mg  or Crestor 20-40mg ) prescribed? - Yes For EF <40%, was ACEI/ARB prescribed? - Not Applicable (EF >/= 67%) For EF <40%, Aldosterone Antagonist (Spironolactone or Eplerenone) prescribed? - Not Applicable (EF >/= 20%) Cardiac Rehab Phase II ordered? - Yes   ____________  Discharge Vitals Blood pressure 101/61, pulse 82, temperature 98.2 F (36.8 C), temperature source Oral, resp. rate 19, height 5\' 6"  (1.676 m), weight 71.6 kg, SpO2 96 %.  Filed Weights   12/21/20 0556 12/21/20 1309  Weight: 73.5 kg 71.6 kg    Labs & Radiologic Studies    CBC Recent Labs    12/21/20 0546 12/22/20 0150  WBC 9.3 9.2  HGB 16.0 13.6  HCT 47.5 40.4  MCV 94.1 92.9  PLT 314 947   Basic Metabolic Panel Recent Labs    12/21/20 0546 12/22/20 0150  NA 137 136  K 5.0 5.0  CL 109 111  CO2 18* 19*  GLUCOSE 113* 99  BUN 35* 30*  CREATININE 1.26* 1.25*  CALCIUM 9.6 8.7*   Liver Function Tests No results for input(s): AST, ALT, ALKPHOS, BILITOT, PROT, ALBUMIN in the last 72 hours. No results for input(s): LIPASE, AMYLASE in the last 72 hours. High Sensitivity Troponin:   No results for input(s): TROPONINIHS in the last 720 hours.  BNP Invalid input(s): POCBNP D-Dimer No results for input(s): DDIMER in the last 72 hours. Hemoglobin A1C No results for input(s): HGBA1C in the last 72 hours. Fasting Lipid Panel Recent Labs    12/22/20 0150  CHOL 96  HDL 31*  LDLCALC 39  TRIG 130  CHOLHDL 3.1   Thyroid Function Tests No results for  input(s): TSH, T4TOTAL, T3FREE, THYROIDAB in the last 72 hours.  Invalid input(s): FREET3 _____________  CARDIAC CATHETERIZATION  Result Date: 12/21/2020   Mid RCA-1 lesion is 80% stenosed.   Mid RCA-2 lesion is 30% stenosed.   Dist RCA lesion is 70% stenosed.   Prox RCA lesion is 65% stenosed.   A stent was successfully placed.   A stent was successfully placed.   A stent was successfully placed.   Post intervention, there is a 0% residual stenosis.   Post intervention, there is a 0% residual stenosis.   Post intervention, there is a 0% residual stenosis.   Post intervention, there is a 0% residual stenosis.  PCI of proximal to distal RCA with three overlapping drug-eluting stents using orbital athrectomy.  A small perforation was seen in the mid vessel that did not improve with prolonged balloon inflation and the patient was stable throughout the entire  procedure.  Limited echocardiographic images at the conclusion of the case demonstrated no significant effusion.  The patient will be observed overnight and repeat echocardiogram obtained.   CARDIAC CATHETERIZATION  Addendum Date: 12/06/2020     Dist RCA lesion is 70% stenosed.   Ost LM to Mid LM lesion is 99% stenosed.   Ost Cx to Prox Cx lesion is 60% stenosed.   Origin to Prox Graft lesion is 100% stenosed.   Prox LAD lesion is 100% stenosed.   Mid RCA-1 lesion is 99% stenosed.   Mid RCA-2 lesion is 99% stenosed.   Scoring balloon angioplasty was performed using a BALLOON SCOREFLEX 3.0X15.   Post intervention, there is a 80% residual stenosis.   Post intervention, there is a 30% residual stenosis.   SVG graft was visualized by angiography.   LIMA graft was not visualized.   The graft exhibits no disease.   The left ventricular systolic function is normal.   LV end diastolic pressure is normal.   The left ventricular ejection fraction is 55-65% by visual estimate.   There is no mitral valve regurgitation. 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 The distal LAD is seen to fill from right to left collaterals. 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 It is presumed that there is not adequate flow into the LIMA graft given the CTO of the left subclavian artery. The proximal Circumflex has a moderate stenosis. The vein graft to the obtuse marginal branch is patent. The RCA is a large dominant artery with serial severe, calcified mid and distal lesions. Balloon angioplasty and scoring balloon angioplasty of the mid RCA lesions. Unable to deliver stents to the mid RCA lesions despite adequate standard balloon and scoring balloon expansion. 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 Scot Dock. This supplies the LIMA graft which I could not visualize today due to the occlusion of the subclavian artery. I suspect that his RCA disease in the culprit for his recent angina. Attempted PCI today but will need to bring him back in 2 weeks for orbital atherectomy and stenting of the RCA. I have set this up for 12/21/20 at 7:30 am. I have spoken to the [employer] rep who can be here that day. He will continue on ASA and Brilinta. Monitor overnight on telemetry. Plan d/c home tomorrow on ASA/Brilinta if stable.   Result Date: 12/06/2020   Dist RCA lesion is 70% stenosed.   Ost LM to Mid LM lesion is 99% stenosed.   Ost Cx to Prox Cx lesion is 60% stenosed.   Origin to Prox Graft lesion is 100% stenosed.   Prox LAD lesion is 100% stenosed.   Mid RCA-1 lesion is 99% stenosed.   Mid RCA-2 lesion is 99% stenosed.   Scoring balloon angioplasty was performed using a BALLOON SCOREFLEX 3.0X15.   Post intervention, there is a 80% residual stenosis.   Post intervention, there  is a 30% residual stenosis.   SVG graft was visualized by angiography.   LIMA graft was not visualized.   The graft exhibits no disease.   The left ventricular systolic function is normal.   LV end diastolic pressure is normal.   The left  ventricular ejection fraction is 55-65% by visual estimate.   There is no mitral valve regurgitation. 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 The distal LAD is seen to fill from right to left collaterals. 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 It is presumed that there is not adequate flow into the LIMA graft given the CTO of the left subclavian artery. The proximal Circumflex has a moderate stenosis. The vein graft to the obtuse marginal branch is patent. The RCA is a large dominant artery with serial severe, calcified mid and distal lesions. Balloon angioplasty and scoring balloon angioplasty of the mid RCA lesions. Unable to deliver stents to the mid RCA lesions despite adequate standard balloon and scoring balloon expansion. 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 Scot Dock. This supplies the LIMA graft which I could not visualize today due to the occlusion of the subclavian artery. I suspect that his RCA disease in the culprit for his recent angina. Attempted PCI today but will need to bring him back in 2 weeks for orbital atherectomy and stenting of the RCA. I have set this up for 12/21/20 at 7:30 am. I have spoken to the [employer] rep who can be here that day. He will continue on ASA and Brilinta. Monitor overnight on telemetry. Plan d/c home tomorrow on ASA/Brilinta if stable.   ECHOCARDIOGRAM LIMITED  Result Date: 12/21/2020    ECHOCARDIOGRAM  LIMITED REPORT   Patient Name:   William [name]. Date of Exam: 12/21/2020 Medical Rec #:  [id number]           Height:       66.0 in Accession #:    [id number]          Weight:       162.0 lb Date of Birth:  [birth year]            BSA:          1.828 m Patient Age:    28 years            BP:           174/59 mmHg Patient Gender: M                   HR:           60 bpm. Exam Location:  Inpatient Procedure: Limited Echo         STAT ECHO Reported to: Dr Angelena Form. Indications:    CAD Native Vessel i25.10  History:        Patient has prior history of Echocardiogram examinations, most                 recent 06/22/2019. Prior CABG, COPD; Risk Factors:Hypertension                 and Dyslipidemia.  Sonographer:    Raquel Sarna Senior RDCS Referring Phys: [place]  Sonographer Comments: Limited, sweep for effusion in cath lab. IMPRESSIONS  1. Left ventricular ejection fraction, by estimation, is 65 to 70%. The left ventricle has normal function. Comparison(s): No significant change from prior study. Conclusion(s)/Recommendation(s): Limited study for pericardial effusion in the cath lab. Vigorous LV function without  evidence of pericardial effusion. Dr. Angelena Form aware. FINDINGS  Left Ventricle: Left ventricular ejection fraction, by estimation, is 65 to 70%. The left ventricle has normal function. Pericardium: There is no evidence of pericardial effusion. Buford Dresser MD Electronically signed by Buford Dresser MD Signature Date/Time: 12/21/2020/11:00:38 AM    Final    Disposition   Pt is being discharged home today in good condition.  Follow-up Plans & Appointments    Follow-up Information     Verta Ellen., NP. Go on 01/01/2021.   Specialty: Cardiology Why: at 9:20am Contact information: [place] [place] [address] [phone]                Discharge Instructions     Amb Referral to Cardiac Rehabilitation   Complete by: As directed     Diagnosis: Coronary Stents   After initial evaluation and assessments completed: Virtual Based Care may be provided alone or in conjunction with Phase 2 Cardiac Rehab based on patient barriers.: Yes   Call MD for:  difficulty breathing, headache or visual disturbances   Complete by: As directed    Call MD for:  extreme fatigue   Complete by: As directed    Call MD for:  hives   Complete by: As directed    Call MD for:  persistant dizziness or light-headedness   Complete by: As directed    Call MD for:  persistant nausea and vomiting   Complete by: As directed    Call MD for:  redness, tenderness, or signs of infection (pain, swelling, redness, odor or green/yellow discharge around incision site)   Complete by: As directed    Call MD for:  severe uncontrolled pain   Complete by: As directed    Call MD for:  temperature >100.4   Complete by: As directed    Diet - low sodium heart healthy   Complete by: As directed    Discharge instructions   Complete by: As directed    No driving for 5 days. No lifting over 5 lbs for 1 week. No sexual activity for 1 week. Keep procedure site clean & dry. If you notice increased pain, swelling, bleeding or pus, call/return!  You may shower, but no soaking baths/hot tubs/pools for 1 week.   PLEASE DO NOT MISS ANY DOSES OF YOUR BRILINTA!!!!! Also keep a log of you blood pressures and bring back to your follow up appt. Please call the office with any questions.   Patients taking blood thinners should generally stay away from medicines like ibuprofen, Advil, Motrin, naproxen, and Aleve due to risk of stomach bleeding. You may take Tylenol as directed or talk to your primary doctor about alternatives.  PLEASE ENSURE THAT YOU DO NOT RUN OUT OF YOUR BRILINTA/PLAVIX. IF you have issues obtaining this medication due to cost please CALL the office 3-5 business days prior to running out in order to prevent missing doses of this medication.   Increase activity slowly    Complete by: As directed       Discharge Medications   Allergies as of 12/22/2020   No Known Allergies      Medication List     STOP taking these medications    ibuprofen 200 MG tablet Commonly known as: ADVIL       TAKE these medications    amLODipine 10 MG tablet Commonly known as: NORVASC Take 1 tablet (10 mg total) by mouth daily. What changed: when to take this  aspirin EC 81 MG tablet Take 1 tablet (81 mg total) by mouth daily.   Brilinta 90 MG Tabs tablet Generic drug: ticagrelor Take 1 tablet (90 mg total) by mouth 2 (two) times daily.   cholecalciferol 25 MCG (1000 UNIT) tablet Commonly known as: VITAMIN D Take 1,000 Units by mouth in the morning.   ezetimibe 10 MG tablet Commonly known as: ZETIA Take 1 tablet (10 mg total) by mouth daily.   fish oil-omega-3 fatty acids 1000 MG capsule Take 1,000 mg by mouth in the morning.   folic acid 416 MCG tablet Commonly known as: FOLVITE Take 400 mcg by mouth in the morning.   Garlic 6063 MG Tbec Take 2,000 mg by mouth in the morning.   isosorbide mononitrate 60 MG 24 hr tablet Commonly known as: IMDUR Take 1 tablet (60 mg total) by mouth daily. What changed: when to take this   metoprolol tartrate 50 MG tablet Commonly known as: LOPRESSOR Take 1 tablet (50 mg total) by mouth 2 (two) times daily.   multivitamin with minerals Tabs tablet Take 1 tablet by mouth in the morning.   nitroGLYCERIN 0.4 MG SL tablet Commonly known as: NITROSTAT Place 1 tablet (0.4 mg total) under the tongue every 5 (five) minutes as needed for chest pain. If pain does not resolve with rest.   NP Thyroid 60 MG tablet Generic drug: thyroid TAKE 1 TABLET BY MOUTH ONCE DAILY BEFORE BREAKFAST   olmesartan 40 MG tablet Commonly known as: Benicar Take 1 tablet (40 mg total) by mouth daily. What changed: when to take this   omega-3 acid ethyl esters 1 g capsule Commonly known as: LOVAZA Take 1 capsule (1 g total) by  mouth daily. Start taking on: December 23, 2020   rosuvastatin 10 MG tablet Commonly known as: CRESTOR TAKE 1 TABLET BY MOUTH EVERY DAY N What changed:  how much to take how to take this when to take this additional instructions   Stiolto Respimat 2.5-2.5 MCG/ACT Aers Generic drug: Tiotropium Bromide-Olodaterol Inhale 2 puffs into the lungs daily.   thiamine 100 MG tablet Commonly known as: Vitamin B-1 Take 100 mg by mouth every evening.   vitamin C 1000 MG tablet Take 1,000 mg by mouth in the morning.         Outstanding Labs/Studies   None   Duration of Discharge Encounter   Greater than 30 minutes including physician time.  Signed, Kathyrn Drown, NP 12/22/2020, 12:12 PM

## 2020-12-22 NOTE — Progress Notes (Signed)
EP Progress Note  Patient Name: William [name]. Date of Encounter: 12/22/2020  [facility] Cardiologist: Rozann Lesches, MD   Subjective   NAEO. Echo shows no significant effusion this AM.   Inpatient Medications    Scheduled Meds:  amLODipine  10 mg Oral Daily   arformoterol  15 mcg Nebulization BID   And   umeclidinium bromide  1 puff Inhalation Daily   vitamin C  1,000 mg Oral q AM   aspirin EC  81 mg Oral Daily   cholecalciferol  1,000 Units Oral q AM   ezetimibe  10 mg Oral Daily   folic acid  1,610 mcg Oral q AM   influenza vaccine adjuvanted  0.5 mL Intramuscular Tomorrow-1000   irbesartan  37.5 mg Oral Daily   isosorbide mononitrate  60 mg Oral Daily   metoprolol tartrate  50 mg Oral BID   multivitamin with minerals  1 tablet Oral q AM   omega-3 acid ethyl esters  1 g Oral Daily   rosuvastatin  10 mg Oral QPM   sodium chloride flush  3 mL Intravenous Q12H   sodium chloride flush  3 mL Intravenous Q12H   thiamine  100 mg Oral QPM   thyroid  60 mg Oral QAC breakfast   ticagrelor  90 mg Oral BID   Continuous Infusions:  sodium chloride     PRN Meds: sodium chloride, acetaminophen, nitroGLYCERIN, ondansetron (ZOFRAN) IV, ViperSlide lubricant in 0.9 % sodium chloride 1000 ml injection, sodium chloride flush   Vital Signs    Vitals:   12/22/20 0429 12/22/20 0742 12/22/20 0925 12/22/20 1022  BP: (!) 93/54 (!) 103/49 112/70 101/61  Pulse: 81 80  82  Resp: 20 20  19   Temp: 98.4 F (36.9 C) 98 F (36.7 C)  98.2 F (36.8 C)  TempSrc: Oral Oral  Oral  SpO2: 96% 96%  96%  Weight:      Height:        Intake/Output Summary (Last 24 hours) at 12/22/2020 1051 Last data filed at 12/22/2020 0900 Gross per 24 hour  Intake 243 ml  Output 375 ml  Net -132 ml   Last 3 Weights 12/21/2020 12/21/2020 12/20/2020  Weight (lbs) 157 lb 12.8 oz 162 lb 161 lb 1.9 oz  Weight (kg) 71.578 kg 73.483 kg 73.084 kg      Telemetry    Personally Reviewed  ECG     Personally Reviewed  Physical Exam   GEN: No acute distress.   Neck: No JVD Cardiac: RRR, no murmurs, rubs, or gallops. R radial artery access site without hematoma. LFA access site without significant hematoma. Soft. Respiratory: Clear to auscultation bilaterally. GI: Soft, nontender, non-distended  MS: No edema; No deformity. Neuro:  Nonfocal  Psych: Normal affect   Labs    High Sensitivity Troponin:  No results for input(s): TROPONINIHS in the last 720 hours.   Chemistry Recent Labs  Lab 12/21/20 0546 12/22/20 0150  NA 137 136  K 5.0 5.0  CL 109 111  CO2 18* 19*  GLUCOSE 113* 99  BUN 35* 30*  CREATININE 1.26* 1.25*  CALCIUM 9.6 8.7*  GFRNONAA 58* 59*  ANIONGAP 10 6    Lipids  Recent Labs  Lab 12/22/20 0150  CHOL 96  TRIG 130  HDL 31*  LDLCALC 39  CHOLHDL 3.1    Hematology Recent Labs  Lab 12/21/20 0546 12/22/20 0150  WBC 9.3 9.2  RBC 5.05 4.35  HGB 16.0 13.6  HCT  47.5 40.4  MCV 94.1 92.9  MCH 31.7 31.3  MCHC 33.7 33.7  RDW 13.7 13.7  PLT 314 262   Thyroid No results for input(s): TSH, FREET4 in the last 168 hours.  BNPNo results for input(s): BNP, PROBNP in the last 168 hours.  DDimer No results for input(s): DDIMER in the last 168 hours.   Radiology    CARDIAC CATHETERIZATION  Result Date: 12/21/2020   Mid RCA-1 lesion is 80% stenosed.   Mid RCA-2 lesion is 30% stenosed.   Dist RCA lesion is 70% stenosed.   Prox RCA lesion is 65% stenosed.   A stent was successfully placed.   A stent was successfully placed.   A stent was successfully placed.   Post intervention, there is a 0% residual stenosis.   Post intervention, there is a 0% residual stenosis.   Post intervention, there is a 0% residual stenosis.   Post intervention, there is a 0% residual stenosis.  PCI of proximal to distal RCA with three overlapping drug-eluting stents using orbital athrectomy.  A small perforation was seen in the mid vessel that did not improve with prolonged balloon  inflation and the patient was stable throughout the entire procedure.  Limited echocardiographic images at the conclusion of the case demonstrated no significant effusion.  The patient will be observed overnight and repeat echocardiogram obtained.   ECHOCARDIOGRAM LIMITED  Result Date: 12/21/2020    ECHOCARDIOGRAM LIMITED REPORT   Patient Name:   William [name]. Date of Exam: 12/21/2020 Medical Rec #:  [id number]           Height:       66.0 in Accession #:    [id number]          Weight:       162.0 lb Date of Birth:  Feb 27, 1941            BSA:          1.828 m Patient Age:    5 years            BP:           174/59 mmHg Patient Gender: M                   HR:           60 bpm. Exam Location:  Inpatient Procedure: Limited Echo         STAT ECHO Reported to: Dr Angelena Form. Indications:    CAD Native Vessel i25.10  History:        Patient has prior history of Echocardiogram examinations, most                 recent 06/22/2019. Prior CABG, COPD; Risk Factors:Hypertension                 and Dyslipidemia.  Sonographer:    Raquel Sarna Senior RDCS Referring Phys: [place]  Sonographer Comments: Limited, sweep for effusion in cath lab. IMPRESSIONS  1. Left ventricular ejection fraction, by estimation, is 65 to 70%. The left ventricle has normal function. Comparison(s): No significant change from prior study. Conclusion(s)/Recommendation(s): Limited study for pericardial effusion in the cath lab. Vigorous LV function without evidence of pericardial effusion. Dr. Angelena Form aware. FINDINGS  Left Ventricle: Left ventricular ejection fraction, by estimation, is 65 to 70%. The left ventricle has normal function. Pericardium: There is no evidence of pericardial effusion. Buford Dresser MD Electronically signed by Buford Dresser MD Signature Date/Time: 12/21/2020/11:00:38  AM    Final     Cardiac Studies   12/21/2020 LHC PCI of proximal to distal RCA with three overlapping drug-eluting stents using  orbital athrectomy.  A small perforation was seen in the mid vessel that did not improve with prolonged balloon inflation and the patient was stable throughout the entire procedure.  Limited echocardiographic images at the conclusion of the case demonstrated no significant effusion.  The patient will be observed overnight and repeat echocardiogram obtained.    12/22/2020 Echo  Personally reviewed. Formal read pending. No significant effusion noted.     Assessment & Plan   Irene Mitcham. is a 80 y.o. male with a history of angina, HTN, NSTEMI carotid artery disease, HLD, HTN, COPD, PAD. Iliac artery aneurysm.   He underwent PCI with orbital atherectomy yesterday which was complicated by a small perforation. No significant effusion on today's echocardiogram.  #CAD Cont brilenta and asa.   #PAD  #COPD Breathing at baseline  #HLD   OK to discharge today with close follow up.   For questions or updates, please contact [place] Please consult [email or web address] for contact info under        Signed, Vickie Epley, MD  12/22/2020, 10:51 AM

## 2020-12-23 ENCOUNTER — Other Ambulatory Visit: Payer: Self-pay | Admitting: Family Medicine

## 2020-12-31 NOTE — Progress Notes (Signed)
Cardiology Office Note  Date: 01/01/2021   ID: William [name]., DOB Aug 23, 1940, MRN [id number]  PCP:  Pcp, No  Cardiologist:  Rozann Lesches, MD Electrophysiologist:  None   Chief Complaint: Status post PCI to RCA  History of Present Illness: William [name]. is a 80 y.o. male with a history of angina, HTN, NSTEMI carotid artery disease, HLD, HTN, COPD, PAD. Iliac artery aneurysm.   He was last seen by Dr Domenic Polite on 12/04/2020.  He continued with anginal symptoms in spite of medical therapy. Dr Domenic Polite scheduled him for a cardiac catheterization.   He was admitted and had cardiac catheterization on 12/06/2021 by Dr Angelena Form.  Cardiac catheterization was severe complex three-vessel disease.  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  Presumed there was not adequate flow to the LIMA with CTO of the left subclavian.  Attempted balloon angioplasty of the mid RCA lesion but unable to deliver stents.  Placed on DAPT with aspirin and Brilinta with plans to bring back on 12/21/2020 for orbital atherectomy and stenting of RCA.  He was discharged on aspirin, Brilinta, Crestor 10 mg daily, Imdur 60 mg daily, metoprolol 25 mg p.o. twice daily increased to 50 mg p.o. twice daily.  Amlodipine 10 mg daily and olmesartan 40 mg daily.    He recently presented 12/21/2020 for elective atherectomy and stent intervention of proximal/distal RCA with 3 overlapping DES stents.  There was a small perforation seen in the mid vessel that did not improve with prolonged balloon inflation and the patient was stable throughout the entire procedure.  Echo performed at the end of the case demonstrated no significant effusion.  See cath report below.  He is here for follow-up status post recent PCI to RCA.  States he has been doing well.  He denies any issues status post PCI.  He has right radial access site is clean and dry without bruising or redness.  He has a 2+ pulse.   Denies any anginal symptoms or nitroglycerin use, DOE or SOB.  Denies any PND or orthopnea, no bleeding on DAPT therapy.  Blood pressure is elevated on visit however he has a log of blood pressures which seem to be running in the 662H to 476L systolic and 46T to 03T diastolic from home.  He denies any claudication-like symptoms, DVT or PE-like symptoms, or lower extremity edema.  States he has an upcoming visit with Dr. Scot Dock for occluded left subclavian artery which apparently is interfering with blood flow to bypass graft per his statement.     Past Medical History:  Diagnosis Date   Carotid artery disease ([place])    Colon polyp    COPD (chronic obstructive pulmonary disease) ([place])    Coronary artery disease    Multivessel status post CABG with LIMA to LAD and SVG to OM1 2017   Hyperlipidemia    Hypertension    Iliac artery aneurysm ([facility])    Myocardial infarction ([place])    Peripheral arterial disease ([place])    Suboptimal revascularization options    Past Surgical History:  Procedure Laterality Date   ABDOMINAL AORTOGRAM W/LOWER EXTREMITY N/A 08/21/2017   Procedure: ABDOMINAL AORTOGRAM W/LOWER EXTREMITY;  Surgeon: Angelia Mould, MD;  Location: [facility];  Service: Cardiovascular;  Laterality: N/A;   Bilateral renal  artery stenoses  04/23/2009   CARDIAC CATHETERIZATION N/A 05/21/2015   Procedure: Left Heart Cath and Coronary Angiography;  Surgeon: Lorretta Harp,  MD;  Location: [facility];  Service: Cardiovascular;  Laterality: N/A;   CORONARY ARTERY BYPASS GRAFT N/A 05/22/2015   Procedure: CORONARY ARTERY BYPASS GRAFTING (CABG) LIMA-LAD and SVG-OM EVH RIGHT THIGH GREATER SAPHENOUS VEIN;  Surgeon: [name], MD;  Location: [facility];  Service: Open Heart Surgery;  Laterality: N/A;   CORONARY ATHERECTOMY N/A 12/21/2020   Procedure: CORONARY ATHERECTOMY;  Surgeon: Burnell Blanks, MD;  Location: [facility];  Service: Cardiovascular;  Laterality:  N/A;   CORONARY BALLOON ANGIOPLASTY N/A 12/06/2020   Procedure: CORONARY BALLOON ANGIOPLASTY;  Surgeon: Burnell Blanks, MD;  Location: [facility];  Service: Cardiovascular;  Laterality: N/A;   CORONARY STENT INTERVENTION N/A 12/21/2020   Procedure: CORONARY STENT INTERVENTION;  Surgeon: Burnell Blanks, MD;  Location: [facility];  Service: Cardiovascular;  Laterality: N/A;   HIATAL HERNIA REPAIR     INTRAVASCULAR ULTRASOUND/IVUS N/A 12/21/2020   Procedure: Intravascular Ultrasound/IVUS;  Surgeon: Burnell Blanks, MD;  Location: [facility];  Service: Cardiovascular;  Laterality: N/A;   LEFT HEART CATH AND CORS/GRAFTS ANGIOGRAPHY N/A 12/06/2020   Procedure: LEFT HEART CATH AND CORS/GRAFTS ANGIOGRAPHY;  Surgeon: Burnell Blanks, MD;  Location: [facility];  Service: Cardiovascular;  Laterality: N/A;   Percutaneous translumnial angioplasty  04/23/2009   Catheterization of [place]st external iliac artery with Left lower extremity runoff   TEE WITHOUT CARDIOVERSION N/A 05/22/2015   Procedure: TRANSESOPHAGEAL ECHOCARDIOGRAM (TEE);  Surgeon: [name], MD;  Location: [facility];  Service: Open Heart Surgery;  Laterality: N/A;    Current Outpatient Medications  Medication Sig Dispense Refill   amLODipine (NORVASC) 10 MG tablet Take 1 tablet (10 mg total) by mouth daily. (Patient taking differently: Take 10 mg by mouth every evening.) 90 tablet 3   Ascorbic Acid (VITAMIN C) 1000 MG tablet Take 1,000 mg by mouth in the morning.     aspirin EC 81 MG tablet Take 1 tablet (81 mg total) by mouth daily.     cholecalciferol (VITAMIN D) 25 MCG (1000 UNIT) tablet Take 1,000 Units by mouth in the morning.     ezetimibe (ZETIA) 10 MG tablet Take 1 tablet (10 mg total) by mouth daily. 30 tablet 11   fish oil-omega-3 fatty acids 1000 MG capsule Take 1,000 mg by mouth in the morning.     folic acid (FOLVITE) 607 MCG tablet Take 400 mcg by mouth in the morning.      Garlic 3710 MG TBEC Take 2,000 mg by mouth in the morning.     isosorbide mononitrate (IMDUR) 60 MG 24 hr tablet Take 1 tablet (60 mg total) by mouth daily. (Patient taking differently: Take 60 mg by mouth every evening.) 30 tablet 6   metoprolol tartrate (LOPRESSOR) 50 MG tablet Take 1 tablet (50 mg total) by mouth 2 (two) times daily. 60 tablet 11   Multiple Vitamin (MULTIVITAMIN WITH MINERALS) TABS tablet Take 1 tablet by mouth in the morning.     nitroGLYCERIN (NITROSTAT) 0.4 MG SL tablet Place 1 tablet (0.4 mg total) under the tongue every 5 (five) minutes as needed for chest pain. If pain does not resolve with rest. 50 tablet 3   NP THYROID 60 MG tablet TAKE 1 TABLET BY MOUTH ONCE DAILY BEFORE BREAKFAST 90 tablet 0   olmesartan (BENICAR) 40 MG tablet Take 1 tablet (40 mg total) by mouth daily. (Patient taking differently: Take 40 mg by mouth every evening.) 30 tablet 11  omega-3 acid ethyl esters (LOVAZA) 1 g capsule Take 1 capsule (1 g total) by mouth daily. 60 capsule 2   rosuvastatin (CRESTOR) 10 MG tablet TAKE 1 TABLET BY MOUTH EVERY DAY N (Patient taking differently: Take 10 mg by mouth every evening.) 90 tablet 3   thiamine (VITAMIN B-1) 100 MG tablet Take 100 mg by mouth every evening.      ticagrelor (BRILINTA) 90 MG TABS tablet Take 1 tablet (90 mg total) by mouth 2 (two) times daily. 60 tablet 11   Tiotropium Bromide-Olodaterol (STIOLTO RESPIMAT) 2.5-2.5 MCG/ACT AERS Inhale 2 puffs into the lungs daily. 1 each 11   No current facility-administered medications for this visit.   Allergies:  Patient has no known allergies.   Social History: The patient  reports that he quit smoking about 29 years ago. His smoking use included cigarettes. He started smoking about 59 years ago. He has a 60.00 pack-year smoking history. He has quit using smokeless tobacco.  His smokeless tobacco use included snuff and chew. He reports that he does not currently use alcohol after a past usage of about  2.0 - 4.0 standard drinks per week. He reports that he does not use drugs.   Family History: The patient's family history includes Cancer in his mother; Deep vein thrombosis in his father; Heart disease in his father; Pulmonary embolism in his father.   ROS:  Please see the history of present illness. Otherwise, complete review of systems is positive for none.  All other systems are reviewed and negative.   Physical Exam: VS:  BP (!) 150/60   Pulse 64   Ht 5\' 7"  (1.702 m)   Wt 159 lb (72.1 kg)   SpO2 95%   BMI 24.90 kg/m , BMI Body mass index is 24.9 kg/m.  Wt Readings from Last 3 Encounters:  01/01/21 159 lb (72.1 kg)  12/21/20 157 lb 12.8 oz (71.6 kg)  12/20/20 161 lb 1.9 oz (73.1 kg)    General: Patient appears comfortable at rest. Neck: Supple, no elevated JVP or carotid bruits, no thyromegaly. Lungs: Clear to auscultation, nonlabored breathing at rest. Cardiac: Regular rate and rhythm, no S3 or significant systolic murmur, no pericardial rub. Extremities: No pitting edema, distal pulses 2+. Skin: Warm and dry.  Right radial access site clean and dry with good pulse Musculoskeletal: No kyphosis. Neuropsychiatric: Alert and oriented x3, affect grossly appropriate.  ECG:  EKG 12/21/2020 normal sinus rhythm 64  Recent Labwork: 07/04/2020: ALT 16; AST 16 08/28/2020: TSH 0.84 12/22/2020: BUN 30; Creatinine, Ser 1.25; Hemoglobin 13.6; Platelets 262; Potassium 5.0; Sodium 136     Component Value Date/Time   CHOL 96 12/22/2020 0150   TRIG 130 12/22/2020 0150   HDL 31 (L) 12/22/2020 0150   CHOLHDL 3.1 12/22/2020 0150   VLDL 26 12/22/2020 0150   LDLCALC 39 12/22/2020 0150   LDLCALC 81 07/04/2020 1018    Other Studies Reviewed Today:  Echocardiogram 12/22/2020  Calcified lesion in RA also seen on CT scan patient does not have pacing wires not clear if this is calcified eustatchian valve consider f/u outpatient echo for further views or cardiac MRI as outpatient. 1. Left  ventricular ejection fraction, by estimation, is 60 to 65%. The left ventricle has normal function. The left ventricle has no regional wall motion abnormalities. There is mild left ventricular hypertrophy. Left ventricular diastolic parameters are consistent with Grade I diastolic dysfunction (impaired relaxation). 2. 3. Right ventricular systolic function is normal. The right ventricular size is  normal. The mitral valve is abnormal. No evidence of mitral valve regurgitation. No evidence of mitral stenosis. Moderate mitral annular calcification. 4. The aortic valve was not well visualized. There is mild calcification of the aortic valve. Aortic valve regurgitation is trivial. Mild to moderate aortic valve sclerosis/calcification is present, without any evidence of aortic stenosis. 5. The inferior vena cava is normal in size with greater than 50% respiratory variability, suggesting right atrial pressure of 3 mmHg.    Stent intervention 12/21/20:     Mid RCA-1 lesion is 80% stenosed.   Mid RCA-2 lesion is 30% stenosed.   Dist RCA lesion is 70% stenosed.   Prox RCA lesion is 65% stenosed.   A stent was successfully placed.   A stent was successfully placed.   A stent was successfully placed.   Post intervention, there is a 0% residual stenosis.   Post intervention, there is a 0% residual stenosis.   Post intervention, there is a 0% residual stenosis.   Post intervention, there is a 0% residual stenosis.   PCI of proximal to distal RCA with three overlapping drug-eluting stents using orbital athrectomy.  A small perforation was seen in the mid vessel that did not improve with prolonged balloon inflation and the patient was stable throughout the entire procedure.  Limited echocardiographic images at the conclusion of the case demonstrated no significant effusion.  The patient will be observed overnight and repeat echocardiogram obtained.   Diagnostic Dominance: Right Intervention               CORONARY BALLOON ANGIOPLASTY  LEFT HEART CATH AND CORS/GRAFTS ANGIOGRAPHY  12/06/2020 Conclusion      Dist RCA lesion is 70% stenosed.   Ost LM to Mid LM lesion is 99% stenosed.   Ost Cx to Prox Cx lesion is 60% stenosed.   Origin to Prox Graft lesion is 100% stenosed.   Prox LAD lesion is 100% stenosed.   Mid RCA-1 lesion is 99% stenosed.   Mid RCA-2 lesion is 99% stenosed.   Scoring balloon angioplasty was performed using a BALLOON SCOREFLEX 3.0X15.   Post intervention, there is a 80% residual stenosis.   Post intervention, there is a 30% residual stenosis.   SVG graft was visualized by angiography.   LIMA graft was not visualized.   The graft exhibits no disease.   The left ventricular systolic function is normal.   LV end diastolic pressure is normal.   The left ventricular ejection fraction is 55-65% by visual estimate.   There is no mitral valve regurgitation.   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 The distal LAD is seen to fill from right to left collaterals. 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 It is presumed that there is not adequate flow into the LIMA graft given the CTO of the left subclavian artery.  The proximal Circumflex has a moderate stenosis. The vein graft to the obtuse marginal branch is patent.  The RCA is a large dominant artery with serial severe, calcified mid and distal lesions.  Balloon angioplasty and scoring balloon angioplasty of the mid RCA lesions. Unable to deliver stents to the mid RCA lesions despite adequate standard balloon and scoring balloon expansion.  Preserved LV systolic function   Recommendations: He has complex, multi-vessel CAD. His left subclavian artery has been  occluded since at least 2019 at the time of an angiogram by Dr.  Scot Dock. This supplies the LIMA graft which I could not visualize today due to the occlusion of the subclavian artery. I suspect that his RCA disease in the culprit for his recent angina. Attempted PCI today but will need to bring him back in 2 weeks for orbital atherectomy and stenting of the RCA. I have set this up for 12/21/20 at 7:30 am. I have spoken to the [employer] rep who can be here that day. He will continue on ASA and Brilinta. Monitor overnight on telemetry. Plan d/c home tomorrow on ASA/Brilinta if stable.   Diagnostic Dominance: Right Intervention     CT chest without contrast 11/01/2020 IMPRESSION: 1. Mild subpleural reticulation and ground-glass opacity in the periphery of the lung bases, nonspecific. This may be secondary to post infectious/inflammatory scarring, however interstitial lung disease is not excluded. Consider follow-up high-resolution chest CT in 6-12 months to assess for temporal change. 2. Linear and bandlike opacities within the lingula and both lower lobes typical of atelectasis and/or scarring. No evidence of pneumonia or neoplasm. 3. Moderate emphysema. 4. Patulous esophagus with tiny hiatal hernia. 5. Advanced aortic atherosclerosis. 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 Recommend annual imaging followup by CTA or MRA. This recommendation follows 2010 ACCF/AHA/AATS/ACR/ASA/SCA/SCAI/SIR/STS/SVM Guidelines for the Diagnosis and Management of Patients with Thoracic Aortic Disease. Circulation. 2010; 121: V564-P329. Aortic aneurysm NOS (ICD10-I71.9) 6. Dense calcification in the proximal left subclavian likely causes hemodynamic significant stenosis. Post CABG with calcification of native coronary arteries.   Aortic Atherosclerosis (ICD10-I70.0) and Emphysema (ICD10-J43.9).      Echocardiogram 06/22/2019:  1. Left ventricular ejection fraction, by estimation, is 65 to 70%. The  left ventricle has normal function. The left ventricle  has no regional  wall motion abnormalities. There is mild left ventricular hypertrophy.  Left ventricular diastolic parameters  are indeterminate.   2. Right ventricular systolic function is normal. The right ventricular  size is normal. There is normal pulmonary artery systolic pressure. The  estimated right ventricular systolic pressure is 51.8 mmHg.   3. Left atrial size was upper normal.   4. The mitral valve is grossly normal. Trivial mitral valve  regurgitation.   5. The aortic valve is tricuspid. Aortic valve regurgitation is mild.  Mild to moderate aortic valve sclerosis/calcification is present, without  any evidence of aortic stenosis.   6. The inferior vena cava is normal in size with greater than 50%  respiratory variability, suggesting right atrial pressure of 3 mmHg.    Lexiscan Myoview 05/07/2020: There was no ST segment deviation noted during stress. Findings consistent with prior anteroseptal/septal myocardial infarction with moderate peri-infarct ischemia. This is an intermediate risk study. Mildly elevated TID of 1.31 may suggest a component of balanced ischemia and higher risk. The left ventricular ejection fraction is normal (55-65%).   Chest CT 11/01/2020: IMPRESSION: 1. Mild subpleural reticulation and ground-glass opacity in the periphery of the lung bases, nonspecific. This may be secondary to post infectious/inflammatory scarring, however interstitial lung disease is not excluded. Consider follow-up high-resolution chest CT in 6-12 months to assess for temporal change. 2. Linear and bandlike opacities within the lingula and both lower lobes typical of atelectasis and/or scarring. No evidence of pneumonia or neoplasm. 3. Moderate emphysema. 4. Patulous esophagus with tiny hiatal hernia. 5. Advanced aortic atherosclerosis. 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 Recommend annual imaging followup by CTA or MRA. This recommendation  follows  2010 ACCF/AHA/AATS/ACR/ASA/SCA/SCAI/SIR/STS/SVM Guidelines for the Diagnosis and Management of Patients with Thoracic Aortic Disease. Circulation. 2010; 121: M841-L244. Aortic aneurysm NOS (ICD10-I71.9) 6. Dense calcification in the proximal left subclavian likely causes hemodynamic significant stenosis. Post CABG with calcification of native coronary arteries.     Assessment and Plan:  1. CAD in native artery   2. PAD (peripheral artery disease) ([place])   3. COPD GOLD 2    4. Mixed hyperlipidemia   5. Aortic dilatation ([facility])     1. CAD in native artery/status post PCI on 12/06/2020 Dr. Angelena Form  He recently had PCI to his right coronary artery with 3 stents to proximal, mid, and distal RCA on 12/06/2020 Dr. Angelena Form.  Continue Brilinta 90 mg p.o. twice daily.  Continue aspirin 81 mg daily.  Continue Imdur 60 mg p.o. daily.  Continue sublingual nitroglycerin as needed.  Continue metoprolol 50 mg p.o. twice daily.   2. PAD (peripheral artery disease) ([place]) He has severe PAD with claudication managed medically.  Recent ABIs significantly abnormal.  ABIs on 10/17/2020: Right resting ABI since with severe right lower extremity arterial disease.  Right toe brachial index was abnormal.  Left resting ankle-brachial index indicates severe left lower extremity arterial disease, left toe brachial index abnormal.  Following with Dr. [name] vascular surgery.  3. COPD GOLD 2 History of COPD.  He sees Dr. Christinia Gully pulmonology.  Had a recent CT of chest secondary to abnormal chest x-ray with lung opacities ordered by Dr. Melvyn Novas see results above.  Has an upcoming follow-up with Dr. Melvyn Novas in March 26, 2021  4. Mixed hyperlipidemia Continue Crestor 10 mg daily.  Continue fish oil 1000 mg daily.  Continue Zetia 10 mg daily.  Lipid panel 07/04/2020: TC 139, HDL 36, TG 128, LDL 81  5.  Fusiform aneurysmal dilatation of ascending aorta Recent chest CT ordered by Dr. Melvyn Novas demonstrated advanced  aortic atherosclerosis. 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 Recommend annual imaging followup by CTA or MRA.   6.  Essential hypertension Blood pressure elevated today at 150/60.  He brings with him a log with blood pressures in the 120s to 130s over 60s to 70s.  Continue amlodipine 10 mg daily.  Continue olmesartan 40 mg daily.  Continue metoprolol 50 mg p.o. twice daily  7.  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  He will be seeing [name] in the near future for evaluation  Medication Adjustments/Labs and Tests Ordered: Current medicines are reviewed at length with the patient today.  Concerns regarding medicines are outlined above.   Disposition: Follow-up with Dr. Domenic Polite or APP 6 months  Signed, Levell July, NP 01/01/2021 9:41 AM    [facility] [place], [place], [place] [address] Phone: [phone]; Fax: [phone]

## 2021-01-01 ENCOUNTER — Encounter: Payer: Self-pay | Admitting: Family Medicine

## 2021-01-01 ENCOUNTER — Ambulatory Visit (INDEPENDENT_AMBULATORY_CARE_PROVIDER_SITE_OTHER): Payer: Medicare Other | Admitting: Family Medicine

## 2021-01-01 VITALS — BP 150/60 | HR 64 | Ht 67.0 in | Wt 159.0 lb

## 2021-01-01 DIAGNOSIS — J449 Chronic obstructive pulmonary disease, unspecified: Secondary | ICD-10-CM | POA: Diagnosis not present

## 2021-01-01 DIAGNOSIS — E782 Mixed hyperlipidemia: Secondary | ICD-10-CM

## 2021-01-01 DIAGNOSIS — I77819 Aortic ectasia, unspecified site: Secondary | ICD-10-CM | POA: Diagnosis not present

## 2021-01-01 DIAGNOSIS — I2510 Atherosclerotic heart disease of native coronary artery without angina pectoris: Secondary | ICD-10-CM | POA: Diagnosis not present

## 2021-01-01 DIAGNOSIS — R0602 Shortness of breath: Secondary | ICD-10-CM | POA: Diagnosis not present

## 2021-01-01 DIAGNOSIS — I739 Peripheral vascular disease, unspecified: Secondary | ICD-10-CM

## 2021-01-01 NOTE — Patient Instructions (Addendum)

## 2021-01-03 ENCOUNTER — Ambulatory Visit: Payer: Medicare Other | Admitting: Family Medicine

## 2021-01-03 ENCOUNTER — Other Ambulatory Visit: Payer: Self-pay

## 2021-01-03 ENCOUNTER — Encounter: Payer: Self-pay | Admitting: Vascular Surgery

## 2021-01-03 ENCOUNTER — Other Ambulatory Visit (HOSPITAL_COMMUNITY): Payer: Self-pay

## 2021-01-03 ENCOUNTER — Ambulatory Visit (INDEPENDENT_AMBULATORY_CARE_PROVIDER_SITE_OTHER): Payer: Medicare Other | Admitting: Vascular Surgery

## 2021-01-03 DIAGNOSIS — I771 Stricture of artery: Secondary | ICD-10-CM | POA: Diagnosis not present

## 2021-01-03 NOTE — Progress Notes (Signed)
Patient name: William [name]. 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  I connected with William [name]. on 01/03/21 at 12:40 PM EDT by a telephone visit and verified that I am speaking with the correct person using two identifiers. I discussed the limitations of evaluation and management by telemedicine and the availability of in person appointments. The patient expressed understanding and agreed to proceed.  Location: Patient: Home Provider: Office  HPI: William [name]. is a 80 y.o. male who was referred for evaluation of a left subclavian artery occlusion by Dr. Angelena Form.  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  The distal LAD LAD was seen to fill via right to left collaterals.  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  It was presumed that there was not adequate flow into the LIMA graft to visualize this.  He subsequently underwent PCI of the proximal to distal RCA with overlapping drug-eluting stents in addition to orbital atherectomy.  Of note I been following him with peripheral vascular disease and he underwent an arteriogram back in May 2019.  At that time I performed an arch aortogram just in case he might need axillofemoral bypass grafting.  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  There was poor visualization distally and this did not appear to be something that could be addressed with angioplasty and stenting.  Nor was there a good distal target visualized for consideration for carotid subclavian bypass grafting.  He tells me today that  his chest pain has resolved after he underwent PCI.  His main complaint is some shortness of breath which is being worked up.  He has some stable claudication in his lower extremities but I suspect his activity is really limited by his shortness of breath.  I do not get any history of rest pain or nonhealing ulcers.    Current Outpatient Medications  Medication Sig Dispense Refill   amLODipine (NORVASC) 10 MG tablet Take 1 tablet (10 mg total) by mouth daily. (Patient taking differently: Take 10 mg by mouth every evening.) 90 tablet 3   Ascorbic Acid (VITAMIN C) 1000 MG tablet Take 1,000 mg by mouth in the morning.     aspirin EC 81 MG tablet Take 1 tablet (81 mg total) by mouth daily.     cholecalciferol (VITAMIN D) 25 MCG (1000 UNIT) tablet Take 1,000 Units by mouth in the morning.     ezetimibe (ZETIA) 10 MG tablet Take 1 tablet (10 mg total) by mouth daily. 30 tablet 11   fish oil-omega-3 fatty acids 1000 MG capsule Take 1,000 mg by mouth in the morning.     folic acid (FOLVITE) 409 MCG tablet Take 400 mcg by mouth in the morning.     Garlic 8119 MG TBEC Take 2,000 mg by mouth in the morning.     isosorbide mononitrate (IMDUR) 60 MG 24 hr tablet Take 1 tablet (60 mg total) by mouth daily. (Patient taking differently: Take 60 mg by mouth every evening.) 30 tablet 6   metoprolol  tartrate (LOPRESSOR) 50 MG tablet Take 1 tablet (50 mg total) by mouth 2 (two) times daily. 60 tablet 11   Multiple Vitamin (MULTIVITAMIN WITH MINERALS) TABS tablet Take 1 tablet by mouth in the morning.     nitroGLYCERIN (NITROSTAT) 0.4 MG SL tablet Place 1 tablet (0.4 mg total) under the tongue every 5 (five) minutes as needed for chest pain. If pain does not resolve with rest. 50 tablet 3   NP THYROID 60 MG tablet TAKE 1 TABLET BY MOUTH ONCE DAILY BEFORE BREAKFAST 90 tablet 0   olmesartan (BENICAR) 40 MG tablet Take 1 tablet (40 mg total) by mouth daily. (Patient taking differently: Take 40 mg by mouth every evening.)  30 tablet 11   omega-3 acid ethyl esters (LOVAZA) 1 g capsule Take 1 capsule (1 g total) by mouth daily. 60 capsule 2   rosuvastatin (CRESTOR) 10 MG tablet TAKE 1 TABLET BY MOUTH EVERY DAY N (Patient taking differently: Take 10 mg by mouth every evening.) 90 tablet 3   thiamine (VITAMIN B-1) 100 MG tablet Take 100 mg by mouth every evening.      ticagrelor (BRILINTA) 90 MG TABS tablet Take 1 tablet (90 mg total) by mouth 2 (two) times daily. 60 tablet 11   Tiotropium Bromide-Olodaterol (STIOLTO RESPIMAT) 2.5-2.5 MCG/ACT AERS Inhale 2 puffs into the lungs daily. 1 each 11   No current facility-administered medications for this visit.   REVIEW OF SYSTEMS: Valu.Nieves ] denotes positive finding; [  ] denotes negative finding  CARDIOVASCULAR:  [ ]  chest pain   [ ]  dyspnea on exertion  [ ]  leg swelling  CONSTITUTIONAL:  [ ]  fever   [ ]  chills   OBSERVATIONS/OBJECTIVE: There were no vitals filed for this visit. He is alert and oriented. He does not sound short of breath.  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  Based on his previous arteriogram he did not appear to be a good candidate for angioplasty and stenting to address this.  In addition there was poor visualization distally so no clear target for a carotid subclavian bypass.  Regardless based on his coronary arteriogram he has good right to left collaterals and his chest pain has resolved after his recent intervention.  Therefore I would not recommend an aggressive approach to his chronic left subclavian artery occlusion.  I have ordered follow-up ABIs and an office visit next year so that we can continue to follow his peripheral arterial disease.  FOLLOW UP INSTRUCTIONS:   I discussed the assessment and treatment plan with the patient. The patient was provided an opportunity to ask questions and all were answered. The patient agreed  with the plan and demonstrated an understanding of the instructions. The patient was advised to call back or seek an in-person evaluation if the symptoms worsen or if the condition fails to improve as anticipated.  I provided 10 minutes of non-face-to-face time during this encounter.   [name] [facility]

## 2021-01-07 ENCOUNTER — Other Ambulatory Visit: Payer: Self-pay

## 2021-01-07 ENCOUNTER — Ambulatory Visit: Payer: Medicare Other | Admitting: Internal Medicine

## 2021-01-07 ENCOUNTER — Other Ambulatory Visit (INDEPENDENT_AMBULATORY_CARE_PROVIDER_SITE_OTHER): Payer: Self-pay | Admitting: Nurse Practitioner

## 2021-01-07 ENCOUNTER — Telehealth: Payer: Self-pay

## 2021-01-07 DIAGNOSIS — R5383 Other fatigue: Secondary | ICD-10-CM

## 2021-01-07 DIAGNOSIS — I25118 Atherosclerotic heart disease of native coronary artery with other forms of angina pectoris: Secondary | ICD-10-CM

## 2021-01-07 MED ORDER — THYROID 60 MG PO TABS: 60.0000 mg | ORAL_TABLET | Freq: Every day | ORAL | 0 refills | Status: DC

## 2021-01-07 MED ORDER — NITROGLYCERIN 0.4 MG SL SUBL: 0.4000 mg | SUBLINGUAL_TABLET | SUBLINGUAL | 3 refills | Status: DC | PRN

## 2021-01-07 NOTE — Telephone Encounter (Signed)
Patient use to be seen by provider Hurshel Party C at [facility] location  Patient last seen Dr. Pearline Cables 09/2020...has scheduled toc/np appt for January  Patient currently needs a refill on 2 meds: nitroGLYCERIN (NITROSTAT) 0.4 MG SL tablet NP THYROID 60 MG tablet  Pharmacy: [id number] - [place], [address]  Phone:  [phone] Fax:  [phone]

## 2021-01-08 ENCOUNTER — Ambulatory Visit (INDEPENDENT_AMBULATORY_CARE_PROVIDER_SITE_OTHER): Payer: Medicare Other | Admitting: Internal Medicine

## 2021-01-08 ENCOUNTER — Encounter: Payer: Self-pay | Admitting: Nurse Practitioner

## 2021-01-30 ENCOUNTER — Ambulatory Visit (HOSPITAL_COMMUNITY)
Admission: RE | Admit: 2021-01-30 | Discharge: 2021-01-30 | Disposition: A | Discharge status: Home / Self Care | Payer: Medicare Other | Source: Ambulatory Visit | Attending: Family Medicine | Admitting: Family Medicine

## 2021-01-30 ENCOUNTER — Encounter (INDEPENDENT_AMBULATORY_CARE_PROVIDER_SITE_OTHER): Payer: Medicare Other | Admitting: Nurse Practitioner

## 2021-01-30 ENCOUNTER — Other Ambulatory Visit: Payer: Self-pay

## 2021-01-30 DIAGNOSIS — I2510 Atherosclerotic heart disease of native coronary artery without angina pectoris: Secondary | ICD-10-CM | POA: Insufficient documentation

## 2021-01-30 DIAGNOSIS — R0602 Shortness of breath: Secondary | ICD-10-CM | POA: Insufficient documentation

## 2021-01-30 DIAGNOSIS — I77819 Aortic ectasia, unspecified site: Secondary | ICD-10-CM | POA: Insufficient documentation

## 2021-01-30 LAB — ECHOCARDIOGRAM COMPLETE
Area-P 1/2: 2.79 cm2
S' Lateral: 2.5 cm
Single Plane A4C EF: 79.5 %

## 2021-01-30 NOTE — Progress Notes (Signed)
  Echocardiogram 2D Echocardiogram has been performed.  William [name] 01/30/2021, 3:35 PM

## 2021-02-07 ENCOUNTER — Telehealth: Payer: Self-pay | Admitting: *Deleted

## 2021-02-07 NOTE — Telephone Encounter (Signed)
Patient informed. 

## 2021-02-07 NOTE — Telephone Encounter (Signed)
-----   Message from Merlene Laughter, RN sent at 01/31/2021  1:09 PM EDT -----  ----- Message ----- From: Verta Ellen., NP Sent: 01/30/2021   9:11 PM EDT To: Laurine Blazer, LPN  Please call the patient and let him know the echocardiogram shows good pumping function of the heart. The main pumping chamber is mildly more muscular and stiff than normal. The best management is to keep blood pressure at or below 130/80 and manage all other risk factors. No evidence of valvular disease.   Verta Ellen, NP  01/30/2021 9:07 PM

## 2021-02-20 ENCOUNTER — Other Ambulatory Visit (INDEPENDENT_AMBULATORY_CARE_PROVIDER_SITE_OTHER): Payer: Self-pay | Admitting: Nurse Practitioner

## 2021-02-20 ENCOUNTER — Other Ambulatory Visit: Payer: Self-pay | Admitting: Family Medicine

## 2021-02-20 ENCOUNTER — Other Ambulatory Visit: Payer: Self-pay

## 2021-02-20 DIAGNOSIS — J449 Chronic obstructive pulmonary disease, unspecified: Secondary | ICD-10-CM

## 2021-02-20 MED ORDER — METOPROLOL TARTRATE 50 MG PO TABS: 50.0000 mg | ORAL_TABLET | Freq: Two times a day (BID) | ORAL | 11 refills | Status: DC

## 2021-03-07 ENCOUNTER — Encounter (HOSPITAL_COMMUNITY)
Admission: RE | Admit: 2021-03-07 | Discharge: 2021-03-07 | Disposition: A | Discharge status: Home / Self Care | Payer: Medicare Other | Source: Ambulatory Visit | Attending: Cardiology | Admitting: Cardiology

## 2021-03-07 ENCOUNTER — Encounter (HOSPITAL_COMMUNITY): Payer: Self-pay

## 2021-03-07 VITALS — BP 130/60 | HR 57 | Ht 66.0 in | Wt 156.7 lb

## 2021-03-07 DIAGNOSIS — Z955 Presence of coronary angioplasty implant and graft: Secondary | ICD-10-CM | POA: Insufficient documentation

## 2021-03-07 NOTE — Progress Notes (Signed)
Cardiac Individual Treatment Plan  Patient Details  Name: William [name]. MRN: [id number] Date of Birth: [birth year] Referring Provider:   Flowsheet Row CARDIAC REHAB PHASE II ORIENTATION from 03/07/2021 in [place]  Referring Provider Dr. Domenic Polite       Initial Encounter Date:  Flowsheet Row CARDIAC REHAB PHASE II ORIENTATION from 03/07/2021 in [place]  Date 03/07/21       Visit Diagnosis: S/P coronary artery stent placement  Patient's Home Medications on Admission:  Current Outpatient Medications:    amLODipine (NORVASC) 10 MG tablet, Take 1 tablet (10 mg total) by mouth daily., Disp: 90 tablet, Rfl: 3   Ascorbic Acid (VITAMIN C) 1000 MG tablet, Take 1,000 mg by mouth in the morning., Disp: , Rfl:    aspirin EC 81 MG tablet, Take 1 tablet (81 mg total) by mouth daily., Disp: , Rfl:    cholecalciferol (VITAMIN D) 25 MCG (1000 UNIT) tablet, Take 1,000 Units by mouth in the morning., Disp: , Rfl:    ezetimibe (ZETIA) 10 MG tablet, Take 1 tablet (10 mg total) by mouth daily., Disp: 30 tablet, Rfl: 11   fish oil-omega-3 fatty acids 1000 MG capsule, Take 1,000 mg by mouth in the morning., Disp: , Rfl:    folic acid (FOLVITE) 093 MCG tablet, Take 400 mcg by mouth in the morning., Disp: , Rfl:    Garlic 2671 MG TBEC, Take 2,000 mg by mouth in the morning., Disp: , Rfl:    isosorbide mononitrate (IMDUR) 60 MG 24 hr tablet, Take 1 tablet (60 mg total) by mouth daily. (Patient taking differently: Take 60 mg by mouth every evening.), Disp: 30 tablet, Rfl: 6   metoprolol tartrate (LOPRESSOR) 50 MG tablet, Take 1 tablet (50 mg total) by mouth 2 (two) times daily., Disp: 60 tablet, Rfl: 11   Multiple Vitamin (MULTIVITAMIN WITH MINERALS) TABS tablet, Take 1 tablet by mouth in the morning., Disp: , Rfl:    nitroGLYCERIN (NITROSTAT) 0.4 MG SL tablet, Place 1 tablet (0.4 mg total) under the tongue every 5 (five) minutes as needed for chest pain. If  pain does not resolve with rest., Disp: 50 tablet, Rfl: 3   olmesartan (BENICAR) 40 MG tablet, Take 1 tablet (40 mg total) by mouth daily., Disp: 30 tablet, Rfl: 11   omega-3 acid ethyl esters (LOVAZA) 1 g capsule, Take 1 capsule (1 g total) by mouth daily., Disp: 60 capsule, Rfl: 2   rosuvastatin (CRESTOR) 10 MG tablet, TAKE 1 TABLET BY MOUTH EVERY DAY N, Disp: 90 tablet, Rfl: 3   thiamine (VITAMIN B-1) 100 MG tablet, Take 100 mg by mouth every evening. , Disp: , Rfl:    thyroid (NP THYROID) 60 MG tablet, Take 1 tablet (60 mg total) by mouth daily before breakfast., Disp: 90 tablet, Rfl: 0   ticagrelor (BRILINTA) 90 MG TABS tablet, Take 1 tablet (90 mg total) by mouth 2 (two) times daily., Disp: 60 tablet, Rfl: 11   Tiotropium Bromide-Olodaterol (STIOLTO RESPIMAT) 2.5-2.5 MCG/ACT AERS, Inhale 2 puffs into the lungs daily. 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[facility])    Myocardial infarction ([place])    Peripheral arterial disease ([place])    Suboptimal revascularization options    Tobacco Use: Social History   Tobacco Use  Smoking Status Former   Packs/day: 2.00   Years: 30.00   Pack years: 60.00   Types: Cigarettes   Start date: 06/03/1961   Quit date: 11/17/1991   Years since quitting: 29.3  Smokeless Tobacco Former   Types: Snuff, Chew    Labs: Recent Review Flowsheet Data     Labs for ITP Cardiac and Pulmonary Rehab Latest Ref Rng & Units 05/23/2015 08/21/2017 07/18/2019 07/04/2020 12/22/2020   Cholestrol 0 - 200 mg/dL - - 127 139 96   LDLCALC 0 - 99 mg/dL - - 71 81 39   HDL >40 mg/dL - - 41 36(L) 31(L)   Trlycerides <150 mg/dL - - 74 128 130    Hemoglobin A1c 4.8 - 5.6 % - - - - -   PHART 7.350 - 7.450 - - - - -   PCO2ART 35.0 - 45.0 mmHg - - - - -   HCO3 20.0 - 24.0 mEq/L - - - - -   TCO2 22 - 32 mmol/L 23 24 - - -   ACIDBASEDEF 0.0 - 2.0 mmol/L - - - - -   O2SAT % - - - - -       Capillary Blood Glucose: Lab Results  Component Value Date   GLUCAP 81 05/29/2015   GLUCAP 90 05/29/2015   GLUCAP 100 (H) 05/28/2015   GLUCAP 92 05/28/2015   GLUCAP 94 05/28/2015     Exercise Target Goals: Exercise Program Goal: Individual exercise prescription set using results from initial 6 min walk test and THRR while considering  patient's activity barriers and safety.   Exercise Prescription Goal: Starting with aerobic activity 30 plus minutes a day, 3 days per week for initial exercise prescription. Provide home exercise prescription and guidelines that participant acknowledges understanding prior to discharge.  Activity Barriers & Risk Stratification:  Activity Barriers & Cardiac Risk Stratification - 03/07/21 1248       Activity Barriers & Cardiac Risk Stratification   Activity Barriers Arthritis;Deconditioning;Shortness of Breath    Cardiac Risk Stratification High             6 Minute Walk:  6 Minute Walk     Row Name 03/07/21 1327         6 Minute Walk   Phase Initial     Distance 600 feet     Walk Time 6 minutes     # of Rest Breaks 3     MPH 1.14     METS 1.13     RPE 15     VO2 Peak 3.96     Symptoms Yes (comment)     Comments Claudication 9/10 caused 3 standing breaks for 150 sec, 30 sec, and 30 sec     Resting HR 57 bpm     Resting BP 130/60     Resting Oxygen Saturation  97 %     Exercise Oxygen Saturation  during 6 min walk 96 %     Max Ex. HR 79 bpm     Max Ex. BP 138/60     2 Minute Post BP 120/64              Oxygen Initial Assessment:   Oxygen Re-Evaluation:   Oxygen Discharge (Final Oxygen Re-Evaluation):   Initial Exercise Prescription:  Initial  Exercise Prescription  - 03/07/21 1300       Date of Initial Exercise RX and Referring Provider   Date 03/07/21    Referring Provider Dr. Domenic Polite    Expected Discharge Date 05/31/21      NuStep   Level 1    SPM 60    Minutes 39      Prescription Details   Frequency (times per week) 3    Duration Progress to 30 minutes of continuous aerobic without signs/symptoms of physical distress      Intensity   THRR 40-80% of Max Heartrate 56-113    Ratings of Perceived Exertion 11-13    Perceived Dyspnea 0-4      Resistance Training   Training Prescription Yes    Weight 3 lbs    Reps 10-15             Perform Capillary Blood Glucose checks as needed.  Exercise Prescription Changes:   Exercise Comments:   Exercise Goals and Review:   Exercise Goals     Row Name 03/07/21 1330             Exercise Goals   Increase Physical Activity Yes       Intervention Provide advice, education, support and counseling about physical activity/exercise needs.;Develop an individualized exercise prescription for aerobic and resistive training based on initial evaluation findings, risk stratification, comorbidities and participant's personal goals.       Expected Outcomes Short Term: Attend rehab on a regular basis to increase amount of physical activity.;Long Term: Exercising regularly at least 3-5 days a week.;Long Term: Add in home exercise to make exercise part of routine and to increase amount of physical activity.       Increase Strength and Stamina Yes       Intervention Provide advice, education, support and counseling about physical activity/exercise needs.;Develop an individualized exercise prescription for aerobic and resistive training based on initial evaluation findings, risk stratification, comorbidities and participant's personal goals.       Expected Outcomes Short Term: Increase workloads from initial exercise prescription for resistance, speed, and METs.;Short Term: Perform resistance training  exercises routinely during rehab and add in resistance training at home;Long Term: Improve cardiorespiratory fitness, muscular endurance and strength as measured by increased METs and functional capacity (6MWT)       Able to understand and use rate of perceived exertion (RPE) scale Yes       Intervention Provide education and explanation on how to use RPE scale       Expected Outcomes Short Term: Able to use RPE daily in rehab to express subjective intensity level;Long Term:  Able to use RPE to guide intensity level when exercising independently       Knowledge and understanding of Target Heart Rate Range (THRR) Yes       Intervention Provide education and explanation of THRR including how the numbers were predicted and where they are located for reference       Expected Outcomes Short Term: Able to state/look up THRR;Long Term: Able to use THRR to govern intensity when exercising independently;Short Term: Able to use daily as guideline for intensity in rehab       Able to check pulse independently Yes       Intervention Provide education and demonstration on how to check pulse in carotid and radial arteries.;Review the importance of being able to check your own pulse for safety during independent exercise       Expected Outcomes Short  Term: Able to explain why pulse checking is important during independent exercise;Long Term: Able to check pulse independently and accurately       Understanding of Exercise Prescription Yes       Intervention Provide education, explanation, and written materials on patient's individual exercise prescription       Expected Outcomes Short Term: Able to explain program exercise prescription;Long Term: Able to explain home exercise prescription to exercise independently                Exercise Goals Re-Evaluation :    Discharge Exercise Prescription (Final Exercise Prescription Changes):   Nutrition:  Target Goals: Understanding of nutrition guidelines, daily  intake of sodium 1500mg , cholesterol 200mg , calories 30% from fat and 7% or less from saturated fats, daily to have 5 or more servings of fruits and vegetables.  Biometrics:  Pre Biometrics - 03/07/21 1331       Pre Biometrics   Height 5\' 6"  (1.676 m)    Weight 71.1 kg    Waist Circumference 40 inches    Hip Circumference 37.5 inches    Waist to Hip Ratio 1.07 %    BMI (Calculated) 25.31    Triceps Skinfold 28 mm    % Body Fat 29.9 %    Flexibility 0 in    Single Leg Stand 3 seconds              Nutrition Therapy Plan and Nutrition Goals:  Nutrition Therapy & Goals - 03/07/21 1311       Personal Nutrition Goals   Comments Patient scored 26 on his diet assessment. Handout provided and explained regarding healthier choices. We offer 2 educational sessions on heart healthy nutrition with handouts and assistance with RD referral if patient is interested.      Intervention Plan   Intervention Nutrition handout(s) given to patient.    Expected Outcomes Short Term Goal: Understand basic principles of dietary content, such as calories, fat, sodium, cholesterol and nutrients.             Nutrition Assessments:  Nutrition Assessments - 03/07/21 1311       MEDFICTS Scores   Pre Score 26            MEDIFICTS Score Key: ?70 Need to make dietary changes  40-70 Heart Healthy Diet ? 40 Therapeutic Level Cholesterol Diet   Picture Your Plate Scores: <01 Unhealthy dietary pattern with much room for improvement. 41-50 Dietary pattern unlikely to meet recommendations for good health and room for improvement. 51-60 More healthful dietary pattern, with some room for improvement.  >60 Healthy dietary pattern, although there may be some specific behaviors that could be improved.    Nutrition Goals Re-Evaluation:   Nutrition Goals Discharge (Final Nutrition Goals Re-Evaluation):   Psychosocial: Target Goals: Acknowledge presence or absence of significant depression  and/or stress, maximize coping skills, provide positive support system. Participant is able to verbalize types and ability to use techniques and skills needed for reducing stress and depression.  Initial Review & Psychosocial Screening:  Initial Psych Review & Screening - 03/07/21 1358       Initial Review   Current issues with None Identified      Family Dynamics   Good Support System? Yes      Barriers   Psychosocial barriers to participate in program There are no identifiable barriers or psychosocial needs.      Screening Interventions   Interventions Encouraged to exercise    Expected Outcomes Short  Term goal: Utilizing psychosocial counselor, staff and physician to assist with identification of specific Stressors or current issues interfering with healing process. Setting desired goal for each stressor or current issue identified.             Quality of Life Scores:  Quality of Life - 03/07/21 1326       Quality of Life   Select Quality of Life      Quality of Life Scores   Health/Function Pre 19.14 %    Socioeconomic Pre 19.92 %    Psych/Spiritual Pre 22.5 %    Family Pre 24 %    GLOBAL Pre 20.68 %            Scores of 19 and below usually indicate a poorer quality of life in these areas.  A difference of  2-3 points is a clinically meaningful difference.  A difference of 2-3 points in the total score of the Quality of Life Index has been associated with significant improvement in overall quality of life, self-image, physical symptoms, and general health in studies assessing change in quality of life.  PHQ-9: Recent Review Flowsheet Data     Depression screen [facility] 2/9 03/07/2021 10/03/2020 07/04/2020 01/26/2020 07/18/2019   Decreased Interest 3 0 0 0 0   Down, Depressed, Hopeless 0 0 0 0 0   PHQ - 2 Score 3 0 0 0 0   Altered sleeping 0 0 0 - -   Tired, decreased energy 3 0 0 - -   Change in appetite 0 0 0 - -   Feeling bad or failure about yourself  0 0 0 - -    Trouble concentrating 0 0 0 - -   Moving slowly or fidgety/restless 0 0 0 - -   Suicidal thoughts 0 0 0 - -   PHQ-9 Score 6 0 0 - -   Difficult doing work/chores Somewhat difficult Not difficult at all Not difficult at all - -      Interpretation of Total Score  Total Score Depression Severity:  1-4 = Minimal depression, 5-9 = Mild depression, 10-14 = Moderate depression, 15-19 = Moderately severe depression, 20-27 = Severe depression   Psychosocial Evaluation and Intervention:  Psychosocial Evaluation - 03/07/21 1358       Psychosocial Evaluation & Interventions   Interventions Stress management education;Relaxation education;Encouraged to exercise with the program and follow exercise prescription    Comments Patient has no psychosocial barriers or issues identified at his orientation visit. He scored 6 on his PHQ-9 mainly due to his fatigue and not feeling like doing any activities mentally or physically. His overall QOL was 20.68% scoring lowest in health/function at 19.14%. He denies any depression or anxiety. He lives alone. His wife expired in 2014 due to cancer. He has a son and daughter that he says he if very close to and names as his support people. He has 4 grandchildren. He demonstrates an interest in improving his health and is looking forward to participaing in the program hoping he will gain some strength and energy and be able to do his ADL's.    Expected Outcomes Patient will continue to have no psychosocial barriers or issues identified.    Continue Psychosocial Services  No Follow up required             Psychosocial Re-Evaluation:   Psychosocial Discharge (Final Psychosocial Re-Evaluation):   Vocational Rehabilitation: Provide vocational rehab assistance to qualifying candidates.   Vocational Rehab Evaluation &  Intervention:  Vocational Rehab - 03/07/21 1311       Initial Vocational Rehab Evaluation & Intervention   Assessment shows need for Vocational  Rehabilitation No      Vocational Rehab Re-Evaulation   Comments Patient is retired and does not need vocational rehab.             Education: Education Goals: Education classes will be provided on a weekly basis, covering required topics. Participant will state understanding/return demonstration of topics presented.  Learning Barriers/Preferences:   Education Topics: Hypertension, Hypertension Reduction -Define heart disease and high blood pressure. Discus how high blood pressure affects the body and ways to reduce high blood pressure. Flowsheet Row CARDIAC REHAB PHASE II EXERCISE from 09/26/2015 in [place]  Date 08/01/15  Educator Russella Dar  Instruction Review Code (retired) 2- meets goals/outcomes       Exercise and Your Heart -Discuss why it is important to exercise, the FITT principles of exercise, normal and abnormal responses to exercise, and how to exercise safely. Flowsheet Row CARDIAC REHAB PHASE II EXERCISE from 09/26/2015 in [place]  Date 08/08/15  Educator DC  Instruction Review Code (retired) 2- meets goals/outcomes       Angina -Discuss definition of angina, causes of angina, treatment of angina, and how to decrease risk of having angina. Flowsheet Row CARDIAC REHAB PHASE II EXERCISE from 09/26/2015 in [place]  Date 08/15/15  Educator D Coad  Instruction Review Code 2- meets goals/outcomes       Cardiac Medications -Review what the following cardiac medications are used for, how they affect the body, and side effects that may occur when taking the medications.  Medications include Aspirin, Beta blockers, calcium channel blockers, ACE Inhibitors, angiotensin receptor blockers, diuretics, digoxin, and antihyperlipidemics. Flowsheet Row CARDIAC REHAB PHASE II EXERCISE from 09/26/2015 in [place]  Date 08/22/15  Educator D. Coad  Instruction Review Code  2- meets goals/outcomes       Congestive Heart Failure -Discuss the definition of CHF, how to live with CHF, the signs and symptoms of CHF, and how keep track of weight and sodium intake. Flowsheet Row CARDIAC REHAB PHASE II EXERCISE from 09/26/2015 in [place]  Date 08/29/15  Educator DC  Instruction Review Code 2- meets goals/outcomes       Heart Disease and Intimacy -Discus the effect sexual activity has on the heart, how changes occur during intimacy as we age, and safety during sexual activity. Flowsheet Row CARDIAC REHAB PHASE II EXERCISE from 09/26/2015 in [place]  Date 09/05/15  Educator DC  Instruction Review Code (retired) 2- meets goals/outcomes       Smoking Cessation / COPD -Discuss different methods to quit smoking, the health benefits of quitting smoking, and the definition of COPD. Flowsheet Row CARDIAC REHAB PHASE II EXERCISE from 09/26/2015 in [place]  Date 09/12/15  Educator Russella Dar  Instruction Review Code (retired) 2- meets goals/outcomes       Nutrition I: Fats -Discuss the types of cholesterol, what cholesterol does to the heart, and how cholesterol levels can be controlled. Flowsheet Row CARDIAC REHAB PHASE II EXERCISE from 09/26/2015 in [place]  Date 09/19/15  Educator Russella Dar  Instruction Review Code 2- meets goals/outcomes       Nutrition II: Labels -Discuss the different components of food labels and how to read food label [place] from  09/26/2015 in [place]  Date 09/26/15  Educator Russella Dar  Instruction Review Code 2- meets goals/outcomes       Heart Parts/Heart Disease and PAD -Discuss the anatomy of the heart, the pathway of blood circulation through the heart, and these are affected by heart disease.   Stress I: Signs and Symptoms -Discuss the causes of  stress, how stress may lead to anxiety and depression, and ways to limit stress. Flowsheet Row CARDIAC REHAB PHASE II EXERCISE from 09/26/2015 in [place]  Date 07/11/15  Educator D Coad  Instruction Review Code 2- meets goals/outcomes       Stress II: Relaxation -Discuss different types of relaxation techniques to limit stress. Flowsheet Row CARDIAC REHAB PHASE II EXERCISE from 09/26/2015 in [place]  Date 07/18/15  Educator D Coad  Instruction Review Code 2- meets goals/outcomes       Warning Signs of Stroke / TIA -Discuss definition of a stroke, what the signs and symptoms are of a stroke, and how to identify when someone is having stroke. Flowsheet Row CARDIAC REHAB PHASE II EXERCISE from 09/26/2015 in [place]  Date 07/25/15  Educator Russella Dar  Instruction Review Code 2- meets goals/outcomes       Knowledge Questionnaire Score:  Knowledge Questionnaire Score - 03/07/21 1311       Knowledge Questionnaire Score   Pre Score 21/24             Core Components/Risk Factors/Patient Goals at Admission:  Personal Goals and Risk Factors at Admission - 03/07/21 1355       Core Components/Risk Factors/Patient Goals on Admission    Weight Management Obesity    Improve shortness of breath with ADL's Yes    Intervention Provide education, individualized exercise plan and daily activity instruction to help decrease symptoms of SOB with activities of daily living.    Expected Outcomes Short Term: Improve cardiorespiratory fitness to achieve a reduction of symptoms when performing ADLs;Long Term: Be able to perform more ADLs without symptoms or delay the onset of symptoms    Hypertension Yes    Intervention Monitor prescription use compliance.;Provide education on lifestyle modifcations including regular physical activity/exercise, weight management, moderate sodium restriction and increased consumption  of fresh fruit, vegetables, and low fat dairy, alcohol moderation, and smoking cessation.    Expected Outcomes Long Term: Maintenance of blood pressure at goal levels.    Lipids Yes    Intervention Provide education and support for participant on nutrition & aerobic/resistive exercise along with prescribed medications to achieve LDL 70mg , HDL >40mg .    Expected Outcomes Long Term: Cholesterol controlled with medications as prescribed, with individualized exercise RX and with personalized nutrition plan. Value goals: LDL < 70mg , HDL > 40 mg.    Personal Goal Other Yes    Personal Goal Patient wants to get stronger; have more energy and be able to do his ADL's    Intervention Paitent will attend the CR program with exercise and education for lifestyle modification.    Expected Outcomes Patient will complete the program meeting both program and personal goals.             Core Components/Risk Factors/Patient Goals Review:    Core Components/Risk Factors/Patient Goals at Discharge (Final Review):    ITP Comments:   Comments: Patient arrived for 1st visit/orientation/education at 1230. Patient was referred to CR by Dr. Rozann Lesches due to S/P Coronary Artery Stent placement (Z95.5). During orientation  advised patient on arrival and appointment times what to wear, what to do before, during and after exercise. Reviewed attendance and class policy.  Pt is scheduled to return Cardiac Rehab on 03/11/21 at 11:00. Pt was advised to come to class 15 minutes before class starts.  Discussed RPE/Dpysnea scales. Patient participated in warm up stretches. Patient was able to complete 6 minute walk test.  Telemetry:NSR. Patient was measured for the equipment. Discussed equipment safety with patient. Took patient pre-anthropometric measurements. Patient finished visit at 1330.

## 2021-03-11 ENCOUNTER — Encounter (HOSPITAL_COMMUNITY)
Admission: RE | Admit: 2021-03-11 | Discharge: 2021-03-11 | Disposition: A | Discharge status: Home / Self Care | Payer: Medicare Other | Source: Ambulatory Visit | Attending: Cardiology | Admitting: Cardiology

## 2021-03-11 DIAGNOSIS — Z955 Presence of coronary angioplasty implant and graft: Secondary | ICD-10-CM | POA: Diagnosis not present

## 2021-03-11 NOTE — Progress Notes (Signed)
Daily Session Note  Patient Details  Name: William [name]. MRN: [id number] Date of Birth: 08/24/1940 Referring Provider:   Flowsheet Row CARDIAC REHAB PHASE II ORIENTATION from 03/07/2021 in [place]  Referring Provider Dr. Domenic Polite       Encounter Date: 03/11/2021  Check In:  Session Check In - 03/11/21 1100       Check-In   Supervising physician immediately available to respond to emergencies [facility] MD immediately available    Physician(s) Dr. Domenic Polite    Location [facility]    Staff Present Geanie Cooley, RN;Dalton Kris Mouton, MS, ACSM-CEP, Exercise Physiologist;Heather Otho Ket, BS, Exercise Physiologist;Leeya Rusconi Wynetta Emery, RN, BSN    Virtual Visit No    Medication changes reported     No    Fall or balance concerns reported    Yes    Comments Patient reports losing his balance at times and feeling dizzy if he gets up too fast.    Tobacco Cessation No Change    Warm-up and Cool-down Performed as group-led instruction    Resistance Training Performed No    VAD Patient? No    PAD/SET Patient? No      Pain Assessment   Currently in Pain? No/denies    Pain Score 0-No pain    Multiple Pain Sites No             Capillary Blood Glucose: No results found for this or any previous visit (from the past 24 hour(s)).    Social History   Tobacco Use  Smoking Status Former   Packs/day: 2.00   Years: 30.00   Pack years: 60.00   Types: Cigarettes   Start date: 06/03/1961   Quit date: 11/17/1991   Years since quitting: 29.3  Smokeless Tobacco Former   Types: Snuff, Chew    Goals Met:  Independence with exercise equipment Exercise tolerated well No report of concerns or symptoms today Strength training completed today  Goals Unmet:  Not Applicable  Comments: Check out 1200.   Dr. Kathie Dike is Medical Director for [facility].

## 2021-03-13 ENCOUNTER — Encounter (INDEPENDENT_AMBULATORY_CARE_PROVIDER_SITE_OTHER): Payer: Medicare Other | Admitting: Nurse Practitioner

## 2021-03-13 ENCOUNTER — Other Ambulatory Visit: Payer: Self-pay | Admitting: Nurse Practitioner

## 2021-03-13 ENCOUNTER — Encounter (HOSPITAL_COMMUNITY)
Admission: RE | Admit: 2021-03-13 | Discharge: 2021-03-13 | Disposition: A | Discharge status: Home / Self Care | Payer: Medicare Other | Source: Ambulatory Visit | Attending: Cardiology | Admitting: Cardiology

## 2021-03-13 DIAGNOSIS — R5383 Other fatigue: Secondary | ICD-10-CM

## 2021-03-13 DIAGNOSIS — Z955 Presence of coronary angioplasty implant and graft: Secondary | ICD-10-CM

## 2021-03-13 NOTE — Progress Notes (Signed)
Daily Session Note  Patient Details  Name: William [name]. MRN: [id number] Date of Birth: [birth year] Referring Provider:   Flowsheet Row CARDIAC REHAB PHASE II ORIENTATION from 03/07/2021 in [place]  Referring Provider Dr. Domenic Polite       Encounter Date: 03/13/2021  Check In:  Session Check In - 03/13/21 1100       Check-In   Supervising physician immediately available to respond to emergencies [facility] immediately available    Physician(s) Dr. Johney Frame    Location [facility]    Staff Present Geanie Cooley, RN;Heather Otho Ket, BS, Exercise Physiologist;Debra Wynetta Emery, RN, BSN    Virtual Visit No    Medication changes reported     No    Fall or balance concerns reported    Yes    Comments Patient reports losing his balance at times and feeling dizzy if he gets up too fast.    Tobacco Cessation No Change    Warm-up and Cool-down Performed as group-led instruction    Resistance Training Performed No    VAD Patient? No    PAD/SET Patient? No      Pain Assessment   Currently in Pain? No/denies    Pain Score 0-No pain    Multiple Pain Sites No             Capillary Blood Glucose: No results found for this or any previous visit (from the past 24 hour(s)).    Social History   Tobacco Use  Smoking Status Former   Packs/day: 2.00   Years: 30.00   Pack years: 60.00   Types: Cigarettes   Start date: 06/03/1961   Quit date: 11/17/1991   Years since quitting: 29.3  Smokeless Tobacco Former   Types: Snuff, Chew    Goals Met:  Independence with exercise equipment Exercise tolerated well No report of concerns or symptoms today Strength training completed today  Goals Unmet:  Not Applicable  Comments: check out @ 12:00pm   Dr. Kathie Dike is Medical Director for [place].

## 2021-03-14 ENCOUNTER — Other Ambulatory Visit: Payer: Self-pay | Admitting: *Deleted

## 2021-03-14 MED ORDER — ISOSORBIDE MONONITRATE ER 60 MG PO TB24: 60.0000 mg | ORAL_TABLET | Freq: Every day | ORAL | 3 refills | Status: DC

## 2021-03-15 ENCOUNTER — Other Ambulatory Visit: Payer: Self-pay | Admitting: *Deleted

## 2021-03-15 ENCOUNTER — Encounter (HOSPITAL_COMMUNITY)
Admission: RE | Admit: 2021-03-15 | Discharge: 2021-03-15 | Disposition: A | Discharge status: Home / Self Care | Payer: Medicare Other | Source: Ambulatory Visit | Attending: Cardiology | Admitting: Cardiology

## 2021-03-15 ENCOUNTER — Other Ambulatory Visit: Payer: Self-pay

## 2021-03-15 DIAGNOSIS — Z955 Presence of coronary angioplasty implant and graft: Secondary | ICD-10-CM | POA: Diagnosis not present

## 2021-03-15 MED ORDER — METOPROLOL TARTRATE 50 MG PO TABS: 50.0000 mg | ORAL_TABLET | Freq: Two times a day (BID) | ORAL | 3 refills | Status: DC

## 2021-03-15 NOTE — Progress Notes (Signed)
Daily Session Note  Patient Details  Name: William [name]. MRN: [id number] Date of Birth: 1941-03-01 Referring Provider:   Flowsheet Row CARDIAC REHAB PHASE II ORIENTATION from 03/07/2021 in [place]  Referring Provider Dr. Domenic Polite       Encounter Date: 03/15/2021  Check In:  Session Check In - 03/15/21 1100       Check-In   Supervising physician immediately available to respond to emergencies [facility] immediately available    Physician(s) Dr. Domenic Polite    Location [facility]    Staff Present Hoy Register, MS, ACSM-CEP, Exercise Physiologist;Debra Wynetta Emery, RN, BSN;Heather Otho Ket, BS, Exercise Physiologist    Virtual Visit No    Medication changes reported     No    Fall or balance concerns reported    Yes    Comments Patient reports losing his balance at times and feeling dizzy if he gets up too fast.    Tobacco Cessation No Change    Warm-up and Cool-down Performed as group-led instruction    Resistance Training Performed Yes    VAD Patient? No    PAD/SET Patient? No      Pain Assessment   Currently in Pain? No/denies    Pain Score 0-No pain    Multiple Pain Sites No             Capillary Blood Glucose: No results found for this or any previous visit (from the past 24 hour(s)).    Social History   Tobacco Use  Smoking Status Former   Packs/day: 2.00   Years: 30.00   Pack years: 60.00   Types: Cigarettes   Start date: 06/03/1961   Quit date: 11/17/1991   Years since quitting: 29.3  Smokeless Tobacco Former   Types: Snuff, Chew    Goals Met:  Independence with exercise equipment Exercise tolerated well No report of concerns or symptoms today Strength training completed today  Goals Unmet:  Not Applicable  Comments: checkout time is 1200   Dr. Kathie Dike is Medical Director for [facility].

## 2021-03-18 ENCOUNTER — Encounter (HOSPITAL_COMMUNITY)
Admission: RE | Admit: 2021-03-18 | Discharge: 2021-03-18 | Disposition: A | Discharge status: Home / Self Care | Payer: Medicare Other | Source: Ambulatory Visit | Attending: Cardiology | Admitting: Cardiology

## 2021-03-18 VITALS — Wt 155.4 lb

## 2021-03-18 DIAGNOSIS — Z955 Presence of coronary angioplasty implant and graft: Secondary | ICD-10-CM

## 2021-03-18 NOTE — Progress Notes (Signed)
Daily Session Note  Patient Details  Name: William [name]. MRN: [id number] Date of Birth: 1940-10-24 Referring Provider:   Flowsheet Row CARDIAC REHAB PHASE II ORIENTATION from 03/07/2021 in [place]  Referring Provider Dr. Domenic Polite       Encounter Date: 03/18/2021  Check In:  Session Check In - 03/18/21 1100       Check-In   Supervising physician immediately available to respond to emergencies [facility] immediately available    Physician(s) Dr. Johnsie Cancel    Location [facility]    Staff Present Hoy Register, MS, ACSM-CEP, Exercise Physiologist;Debra Wynetta Emery, RN, BSN;Heather Otho Ket, BS, Exercise Physiologist    Virtual Visit No    Medication changes reported     No    Fall or balance concerns reported    Yes    Comments Patient reports losing his balance at times and feeling dizzy if he gets up too fast.    Tobacco Cessation No Change    Warm-up and Cool-down Performed as group-led instruction    Resistance Training Performed Yes    VAD Patient? No    PAD/SET Patient? No      Pain Assessment   Currently in Pain? No/denies    Pain Score 0-No pain    Multiple Pain Sites No             Capillary Blood Glucose: No results found for this or any previous visit (from the past 24 hour(s)).    Social History   Tobacco Use  Smoking Status Former   Packs/day: 2.00   Years: 30.00   Pack years: 60.00   Types: Cigarettes   Start date: 06/03/1961   Quit date: 11/17/1991   Years since quitting: 29.3  Smokeless Tobacco Former   Types: Snuff, Chew    Goals Met:  Independence with exercise equipment Exercise tolerated well No report of concerns or symptoms today Strength training completed today  Goals Unmet:  Not Applicable  Comments: checkout time is 1200   Dr. Kathie Dike is Medical Director for [facility].

## 2021-03-20 ENCOUNTER — Encounter (HOSPITAL_COMMUNITY)
Admission: RE | Admit: 2021-03-20 | Discharge: 2021-03-20 | Disposition: A | Discharge status: Home / Self Care | Payer: Medicare Other | Source: Ambulatory Visit | Attending: Cardiology | Admitting: Cardiology

## 2021-03-20 DIAGNOSIS — Z955 Presence of coronary angioplasty implant and graft: Secondary | ICD-10-CM

## 2021-03-20 NOTE — Progress Notes (Signed)
Daily Session Note  Patient Details  Name: William [name]. MRN: [id number] Date of Birth: 05-27-1940 Referring Provider:   Flowsheet Row CARDIAC REHAB PHASE II ORIENTATION from 03/07/2021 in [place]  Referring Provider Dr. Domenic Polite       Encounter Date: 03/20/2021  Check In:  Session Check In - 03/20/21 1100       Check-In   Supervising physician immediately available to respond to emergencies [facility] MD immediately available    Physician(s) Dr. Harrington Challenger    Location [facility]    Staff Present Hoy Register, MS, ACSM-CEP, Exercise Physiologist;Clerance Umland Otho Ket, BS, Exercise Physiologist;Debra Wynetta Emery, RN, BSN    Virtual Visit No    Medication changes reported     No    Fall or balance concerns reported    Yes    Comments Patient reports losing his balance at times and feeling dizzy if he gets up too fast.    Tobacco Cessation No Change    Warm-up and Cool-down Performed as group-led instruction    Resistance Training Performed Yes    VAD Patient? No    PAD/SET Patient? No      Pain Assessment   Currently in Pain? No/denies    Pain Score 0-No pain    Multiple Pain Sites No             Capillary Blood Glucose: No results found for this or any previous visit (from the past 24 hour(s)).    Social History   Tobacco Use  Smoking Status Former   Packs/day: 2.00   Years: 30.00   Pack years: 60.00   Types: Cigarettes   Start date: 06/03/1961   Quit date: 11/17/1991   Years since quitting: 29.3  Smokeless Tobacco Former   Types: Snuff, Chew    Goals Met:  Independence with exercise equipment Exercise tolerated well No report of concerns or symptoms today Strength training completed today  Goals Unmet:  Not Applicable  Comments: check out 1200   Dr. Kathie Dike is Medical Director for [facility].

## 2021-03-22 ENCOUNTER — Encounter (HOSPITAL_COMMUNITY)
Admission: RE | Admit: 2021-03-22 | Discharge: 2021-03-22 | Disposition: A | Discharge status: Home / Self Care | Payer: Medicare Other | Source: Ambulatory Visit | Attending: Cardiology | Admitting: Cardiology

## 2021-03-22 DIAGNOSIS — Z955 Presence of coronary angioplasty implant and graft: Secondary | ICD-10-CM | POA: Diagnosis not present

## 2021-03-22 NOTE — Progress Notes (Signed)
Daily Session Note  Patient Details  Name: William [name]. MRN: [id number] Date of Birth: 11/03/1940 Referring Provider:   Flowsheet Row CARDIAC REHAB PHASE II ORIENTATION from 03/07/2021 in [place]  Referring Provider Dr. Domenic Polite       Encounter Date: 03/22/2021  Check In:  Session Check In - 03/22/21 1100       Check-In   Supervising physician immediately available to respond to emergencies [facility] MD immediately available    Physician(s) Dr. Harrington Challenger    Location [facility]    Staff Present Geanie Cooley, RN;Heather Otho Ket, BS, Exercise Physiologist;Debra Wynetta Emery, RN, BSN    Virtual Visit No    Medication changes reported     No    Fall or balance concerns reported    Yes    Comments Patient reports losing his balance at times and feeling dizzy if he gets up too fast.    Tobacco Cessation No Change    Warm-up and Cool-down Performed as group-led instruction    Resistance Training Performed Yes    VAD Patient? No    PAD/SET Patient? No      Pain Assessment   Currently in Pain? No/denies    Pain Score 0-No pain    Multiple Pain Sites No             Capillary Blood Glucose: No results found for this or any previous visit (from the past 24 hour(s)).    Social History   Tobacco Use  Smoking Status Former   Packs/day: 2.00   Years: 30.00   Pack years: 60.00   Types: Cigarettes   Start date: 06/03/1961   Quit date: 11/17/1991   Years since quitting: 29.3  Smokeless Tobacco Former   Types: Snuff, Chew    Goals Met:  Independence with exercise equipment Exercise tolerated well No report of concerns or symptoms today Strength training completed today  Goals Unmet:  Not Applicable  Comments: check out @ 12:00pm   Dr. Kathie Dike is Medical Director for [place].

## 2021-03-25 ENCOUNTER — Encounter (HOSPITAL_COMMUNITY): Payer: Medicare Other

## 2021-03-26 ENCOUNTER — Ambulatory Visit (INDEPENDENT_AMBULATORY_CARE_PROVIDER_SITE_OTHER): Payer: Medicare Other | Admitting: Internal Medicine

## 2021-03-26 ENCOUNTER — Encounter: Payer: Self-pay | Admitting: Internal Medicine

## 2021-03-26 ENCOUNTER — Ambulatory Visit (HOSPITAL_COMMUNITY)
Admission: RE | Admit: 2021-03-26 | Discharge: 2021-03-26 | Disposition: A | Discharge status: Home / Self Care | Payer: Medicare Other | Source: Ambulatory Visit | Attending: Internal Medicine | Admitting: Internal Medicine

## 2021-03-26 ENCOUNTER — Other Ambulatory Visit: Payer: Self-pay

## 2021-03-26 DIAGNOSIS — R9389 Abnormal findings on diagnostic imaging of other specified body structures: Secondary | ICD-10-CM | POA: Diagnosis not present

## 2021-03-26 DIAGNOSIS — J449 Chronic obstructive pulmonary disease, unspecified: Secondary | ICD-10-CM

## 2021-03-26 DIAGNOSIS — I200 Unstable angina: Secondary | ICD-10-CM | POA: Diagnosis not present

## 2021-03-26 DIAGNOSIS — R0602 Shortness of breath: Secondary | ICD-10-CM | POA: Diagnosis not present

## 2021-03-26 NOTE — Patient Instructions (Addendum)
Try Try prilosec otc 20mg   Take 30-60 min before first meal of the day and Pepcid ac (famotidine) 20 mg one @  bedtime until cough is completely gone for at least a week without the need for cough suppression     GERD (REFLUX)  is an extremely common cause of respiratory symptoms just like yours , many times with no obvious heartburn at all.    It can be treated with medication, but also with lifestyle changes including elevation of the head of your bed (ideally with 6 -8inch blocks under the headboard of your bed),  Smoking cessation, avoidance of late meals, excessive alcohol, and avoid fatty foods, chocolate, peppermint, colas, red wine, and acidic juices such as orange juice.  NO MINT OR MENTHOL PRODUCTS SO NO COUGH DROPS  USE SUGARLESS CANDY INSTEAD (Jolley ranchers or Stover's or Life Savers) or even ice chips will also do - the key is to swallow to prevent all throat clearing. NO OIL BASED VITAMINS - use powdered substitutes.  Avoid fish oil when coughing.    Please remember to go to the  x-ray department  @  [facility] for your tests - we will call you with the results when they are available      Please schedule a follow up visit in 6 months but call sooner if needed

## 2021-03-26 NOTE — Progress Notes (Signed)
William [name]., male    DOB: 1941/03/06,    MRN: [id number]   Brief patient profile:  63 yowm quit smoking 1993  with GOLD 2 COPD  criteria 05/2015 while on spiriva with reported doe = MMRC 1  but doesn't think the spiriva dpi is working as well and also limited by claudication and sometimes ex cp with known IHD so referred to pulmonary clinic in [place]  10/11/2020 by Dr    Domenic Polite     History of Present Illness  10/11/2020  Pulmonary/ 1st office eval/ Ottilia Pippenger / [name]  Chief Complaint  Patient presents with   Consult    Patient has COPD and patient states that he has been having trouble with his heart and was sent here to make sure his lungs are ok. Wife died of lung cancer. Shortness of breath with exertion. Takes Spiriva  Dyspnea:  legs usually stop him 1st x 100 ft  flat and slow    Cough: not much Sleep: flat bed/ one pillow  SABA use: none  Rec Stop spiriva and start Stiolto 2 puffs first thing in Am x 2 weeks then try 1 puff daily x 1 weeks then try nothing to see if it really make a difference Work on inhaler technique: If worse chest pains with activity on the stiolto 2 puffs, can try one puff instead but if it persists, stop stiolto If even the 2 puffs doesn't help your breathing , you need a trial off lisinopril 40 and on benicar 40  for at least 4 weeks - call if you want to try it and I'll call it in for you.     12/20/2020  f/u ov/[place] office/Vaniyah Lansky re: abn cxr/ GOLD 2 copd maint on no inhalers   Chief Complaint  Patient presents with   Follow-up    Sob has increased with exertion. No cough. Chest pains discussed in last OV have gone away.   Dyspnea:  mb x 250 ft to mb stops due legs  Cough: gone  Sleeping: no resp cc on cpap SABA use: none  02: none  Covid status: vax x 3  Rec No change in medications Please schedule a follow up visit in 3 months PFTs and we'll do final chest xray then     03/26/2021  f/u ov/[place] office/Deklen Popelka re: gold  2 maint on NOTHING   Chief Complaint  Patient presents with   Follow-up    Feels SOB is about the same last visit.   Pt started cardiac rehab is hoping it will help his SOB.   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   sleeping without nocturnal  or early am exacerbation  of respiratory  c/o's or need for noct saba. Also denies any obvious fluctuation of symptoms with weather or environmental changes or other aggravating or alleviating factors except as outlined above   No unusual exposure hx or h/o childhood pna/ asthma or knowledge of premature birth.  Current Allergies, Complete Past Medical History, Past  Surgical History, Family History, and Social History were reviewed in [employer] record.  ROS  The following are not active complaints unless bolded Hoarseness, sore throat, dysphagia, dental problems, itching, sneezing,  nasal congestion or discharge of excess mucus or purulent secretions, ear ache,   fever, chills, sweats, unintended wt loss or wt gain, classically pleuritic or exertional cp,  orthopnea pnd or arm/hand swelling  or leg swelling, presyncope, palpitations, abdominal pain, anorexia, nausea, vomiting, diarrhea  or change in bowel habits or change in bladder habits, change in stools or change in urine, dysuria, hematuria,  rash, arthralgias, visual complaints, headache, numbness, weakness or ataxia or problems with walking or coordination,  change in mood or  memory.        Current Meds  Medication Sig   amLODipine (NORVASC) 10 MG tablet Take  1 tablet (10 mg total) by mouth daily.   Ascorbic Acid (VITAMIN C) 1000 MG tablet Take 1,000 mg by mouth in the morning.   aspirin EC 81 MG tablet Take 1 tablet (81 mg total) by mouth daily.   cholecalciferol (VITAMIN D) 25 MCG (1000 UNIT) tablet Take 1,000 Units by mouth in the morning.   ezetimibe (ZETIA) 10 MG tablet Take 1 tablet (10 mg total) by mouth daily.   fish oil-omega-3 fatty acids 1000 MG capsule Take 1,000 mg by mouth in the morning.   folic acid (FOLVITE) 502 MCG tablet Take 400 mcg by mouth in the morning.   Garlic 7741 MG TBEC Take 2,000 mg by mouth in the morning.   isosorbide mononitrate (IMDUR) 60 MG 24 hr tablet Take 1 tablet (60 mg total) by mouth daily.   metoprolol tartrate (LOPRESSOR) 50 MG tablet Take 1 tablet (50 mg total) by mouth 2 (two) times daily.   Multiple Vitamin (MULTIVITAMIN WITH MINERALS) TABS tablet Take 1 tablet by mouth in the morning.   nitroGLYCERIN (NITROSTAT) 0.4 MG SL tablet Place 1 tablet (0.4 mg total) under the tongue every 5 (five) minutes as needed for chest pain. If pain does not resolve with rest.   olmesartan (BENICAR) 40 MG tablet Take 1 tablet (40 mg total) by mouth daily.   omega-3 acid ethyl esters (LOVAZA) 1 g capsule Take 1 capsule (1 g total) by mouth daily.   rosuvastatin (CRESTOR) 10 MG tablet TAKE 1 TABLET BY MOUTH EVERY DAY N   thiamine (VITAMIN B-1) 100 MG tablet Take 100 mg by mouth every evening.    thyroid (NP THYROID) 60 MG tablet Take 1 tablet (60 mg total) by mouth daily before breakfast.   ticagrelor (BRILINTA) 90 MG TABS tablet Take 1 tablet (90 mg total) by mouth 2 (two) times daily.                     Objective:       03/26/2021     159   12/20/20 161 lb 1.9 oz (73.1 kg)  12/13/20 162 lb 9.6 oz (73.8 kg)  12/11/20 164 lb (74.4 kg)     Vital signs reviewed  03/26/2021  - Note at rest 02 sats  96% on RA   General appearance:    chronically ill amb elderly wm nad    HEENT : pt wearing mask not removed  for exam due to covid - 19 concerns.   NECK :  without JVD/Nodes/TM/ nl carotid upstrokes bilaterally   LUNGS: no acc muscle use,  Min barrel  contour chest wall with bilateral  slightly decreased bs  s audible wheeze and  without cough on insp or exp maneuvers and min  Hyperresonant  to  percussion bilaterally     CV:  RRR  no s3 or murmur or increase in P2, and no edema   ABD:  soft and nontender with pos end  insp Hoover's  in the supine position. No bruits or organomegaly appreciated, bowel sounds nl  MS:   Nl gait/  ext warm without deformities, calf tenderness, cyanosis or clubbing No obvious joint restrictions   SKIN: warm and dry without lesions    NEURO:  alert, approp, nl sensorium with  no motor or cerebellar deficits apparent.          CXR PA and Lateral:   03/26/2021 :    I personally reviewed images and agree with radiology impression as follows:    No active cardiopulmonary disease.  COPD. Chronic interstitial lung disease.         Assessment

## 2021-03-27 ENCOUNTER — Encounter: Payer: Self-pay | Admitting: Internal Medicine

## 2021-03-27 ENCOUNTER — Encounter (HOSPITAL_COMMUNITY)
Admission: RE | Admit: 2021-03-27 | Discharge: 2021-03-27 | Disposition: A | Discharge status: Home / Self Care | Payer: Medicare Other | Source: Ambulatory Visit | Attending: Cardiology | Admitting: Cardiology

## 2021-03-27 DIAGNOSIS — Z955 Presence of coronary angioplasty implant and graft: Secondary | ICD-10-CM | POA: Diagnosis not present

## 2021-03-27 NOTE — Progress Notes (Signed)
Daily Session Note  Patient Details  Name: William [name]. MRN: [id number] Date of Birth: Jan 12, 1941 Referring Provider:   Flowsheet Row CARDIAC REHAB PHASE II ORIENTATION from 03/07/2021 in [place]  Referring Provider Dr. Domenic Polite       Encounter Date: 03/27/2021  Check In:  Session Check In - 03/27/21 1100       Check-In   Supervising physician immediately available to respond to emergencies [facility] MD immediately available    Physician(s) Dr. Johnsie Cancel    Location [facility]    Staff Present Hoy Register, MS, ACSM-CEP, Exercise Physiologist;Heather Zigmund Daniel, Exercise Physiologist    Virtual Visit No    Medication changes reported     No    Fall or balance concerns reported    Yes    Comments Patient reports losing his balance at times and feeling dizzy if he gets up too fast.    Tobacco Cessation No Change    Warm-up and Cool-down Performed as group-led instruction    Resistance Training Performed Yes    VAD Patient? No    PAD/SET Patient? No      Pain Assessment   Currently in Pain? No/denies    Pain Score 0-No pain    Multiple Pain Sites No             Capillary Blood Glucose: No results found for this or any previous visit (from the past 24 hour(s)).    Social History   Tobacco Use  Smoking Status Former   Packs/day: 2.00   Years: 30.00   Pack years: 60.00   Types: Cigarettes   Start date: 06/03/1961   Quit date: 11/17/1991   Years since quitting: 29.3  Smokeless Tobacco Former   Types: Snuff, Chew    Goals Met:  Independence with exercise equipment Exercise tolerated well No report of concerns or symptoms today Strength training completed today  Goals Unmet:  Not Applicable  Comments: checkout time is 1200   Dr. Kathie Dike is Medical Director for [facility].

## 2021-03-27 NOTE — Assessment & Plan Note (Signed)
See cxr 10/11/20 >>  rec CT chest  11/01/20  1. Mild subpleural reticulation and ground-glass opacity in the periphery of the lung bases, nonspecific.   2. Linear and bandlike opacities within the lingula and both lower lobes typical of atelectasis and/or scarring. No evidence of pneumonia or neoplasm. 3. Moderate emphysema. 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

## 2021-03-27 NOTE — Assessment & Plan Note (Addendum)
Quit smoking 1993 - Spirometry 05/21/2015   FEV1 1.95 (74%)  Ratio 0.66? p spiriva  10/11/2020   Walked RA  approx  367ft  @ nl pace  stopped due to  Leg pain, no sob and sats 95% ; - 10/11/2020  After extensive coaching inhaler device,  effectiveness =    90% try stiolto 2 puffs each am  > no better doe so stopped all inhalers  Doe probably multifactorial but also assoc with throat clearing so may have element of UACS = Upper airway cough syndrome (previously labeled PNDS),  is so named because it's frequently impossible to sort out how much is  CR/sinusitis with freq throat clearing (which can be related to primary GERD)   vs  causing  secondary (" extra esophageal")  GERD from wide swings in gastric pressure that occur with throat clearing, often  promoting self use of mint and menthol lozenges that reduce the lower esophageal sphincter tone and exacerbate the problem further in a cyclical fashion.   These are the same pts (now being labeled as having "irritable larynx syndrome" by some cough centers) who not infrequently have a history of having failed to tolerate ace inhibitors,  dry powder inhalers or biphosphonates or report having atypical/extraesophageal reflux symptoms that don't respond to standard doses of PPI  and are easily confused as having aecopd or asthma flares by even experienced allergists/ pulmonologists (myself included).   Try max rx for gerd, no need for inhalers at this point         Each maintenance medication was reviewed in detail including emphasizing most importantly the difference between maintenance and prns and under what circumstances the prns are to be triggered using an action plan format where appropriate.  Total time for H and P, chart review, counseling,  and generating customized AVS unique to this office visit / same day charting = 25 min

## 2021-03-29 ENCOUNTER — Encounter (HOSPITAL_COMMUNITY)
Admission: RE | Admit: 2021-03-29 | Discharge: 2021-03-29 | Disposition: A | Discharge status: Home / Self Care | Payer: Medicare Other | Source: Ambulatory Visit | Attending: Cardiology | Admitting: Cardiology

## 2021-03-29 DIAGNOSIS — Z955 Presence of coronary angioplasty implant and graft: Secondary | ICD-10-CM

## 2021-03-29 NOTE — Progress Notes (Signed)
Daily Session Note  Patient Details  Name: William [name]. MRN: [id number] Date of Birth: 1940-07-15 Referring Provider:   Flowsheet Row CARDIAC REHAB PHASE II ORIENTATION from 03/07/2021 in [place]  Referring Provider Dr. Domenic Polite       Encounter Date: 03/29/2021  Check In:  Session Check In - 03/29/21 1100       Check-In   Supervising physician immediately available to respond to emergencies [facility] MD immediately available    Physician(s) Dr. Johnsie Cancel    Location [facility]    Staff Present Hoy Register, MS, ACSM-CEP, Exercise Physiologist;Heather Zigmund Daniel, Exercise Physiologist;Other    Virtual Visit No    Medication changes reported     No    Fall or balance concerns reported    Yes    Comments Patient reports losing his balance at times and feeling dizzy if he gets up too fast.    Tobacco Cessation No Change    Warm-up and Cool-down Performed as group-led instruction    Resistance Training Performed Yes    VAD Patient? No    PAD/SET Patient? No      Pain Assessment   Currently in Pain? No/denies    Pain Score 0-No pain    Multiple Pain Sites No             Capillary Blood Glucose: No results found for this or any previous visit (from the past 24 hour(s)).    Social History   Tobacco Use  Smoking Status Former   Packs/day: 2.00   Years: 30.00   Pack years: 60.00   Types: Cigarettes   Start date: 06/03/1961   Quit date: 11/17/1991   Years since quitting: 29.3  Smokeless Tobacco Former   Types: Snuff, Chew    Goals Met:  Independence with exercise equipment Exercise tolerated well No report of concerns or symptoms today Strength training completed today  Goals Unmet:  Not Applicable  Comments: checkout time Is 1200   Dr. Kathie Dike is Medical Director for [facility].

## 2021-04-01 ENCOUNTER — Encounter (HOSPITAL_COMMUNITY): Payer: Medicare Other

## 2021-04-03 ENCOUNTER — Encounter (HOSPITAL_COMMUNITY)
Admission: RE | Admit: 2021-04-03 | Discharge: 2021-04-03 | Disposition: A | Discharge status: Home / Self Care | Payer: Medicare Other | Source: Ambulatory Visit | Attending: Cardiology | Admitting: Cardiology

## 2021-04-03 DIAGNOSIS — Z955 Presence of coronary angioplasty implant and graft: Secondary | ICD-10-CM | POA: Diagnosis not present

## 2021-04-03 NOTE — Progress Notes (Signed)
Cardiac Individual Treatment Plan  Patient Details  Name: William [name]. MRN: [id number] Date of Birth: [birth year] Referring Provider:   Flowsheet Row CARDIAC REHAB PHASE II ORIENTATION from 03/07/2021 in [place]  Referring Provider Dr. Domenic Polite       Initial Encounter Date:  Flowsheet Row CARDIAC REHAB PHASE II ORIENTATION from 03/07/2021 in [place]  Date 03/07/21       Visit Diagnosis: S/P coronary artery stent placement  Patient's Home Medications on Admission:  Current Outpatient Medications:    amLODipine (NORVASC) 10 MG tablet, Take 1 tablet (10 mg total) by mouth daily., Disp: 90 tablet, Rfl: 3   Ascorbic Acid (VITAMIN C) 1000 MG tablet, Take 1,000 mg by mouth in the morning., Disp: , Rfl:    aspirin EC 81 MG tablet, Take 1 tablet (81 mg total) by mouth daily., Disp: , Rfl:    cholecalciferol (VITAMIN D) 25 MCG (1000 UNIT) tablet, Take 1,000 Units by mouth in the morning., Disp: , Rfl:    ezetimibe (ZETIA) 10 MG tablet, Take 1 tablet (10 mg total) by mouth daily., Disp: 30 tablet, Rfl: 11   fish oil-omega-3 fatty acids 1000 MG capsule, Take 1,000 mg by mouth in the morning., Disp: , Rfl:    folic acid (FOLVITE) 425 MCG tablet, Take 400 mcg by mouth in the morning., Disp: , Rfl:    Garlic 9563 MG TBEC, Take 2,000 mg by mouth in the morning., Disp: , Rfl:    isosorbide mononitrate (IMDUR) 60 MG 24 hr tablet, Take 1 tablet (60 mg total) by mouth daily., Disp: 90 tablet, Rfl: 3   metoprolol tartrate (LOPRESSOR) 50 MG tablet, Take 1 tablet (50 mg total) by mouth 2 (two) times daily., Disp: 180 tablet, Rfl: 3   Multiple Vitamin (MULTIVITAMIN WITH MINERALS) TABS tablet, Take 1 tablet by mouth in the morning., Disp: , Rfl:    nitroGLYCERIN (NITROSTAT) 0.4 MG SL tablet, Place 1 tablet (0.4 mg total) under the tongue every 5 (five) minutes as needed for chest pain. If pain does not resolve with rest., Disp: 50 tablet, Rfl: 3    olmesartan (BENICAR) 40 MG tablet, Take 1 tablet (40 mg total) by mouth daily., Disp: 30 tablet, Rfl: 11   omega-3 acid ethyl esters (LOVAZA) 1 g capsule, Take 1 capsule (1 g total) by mouth daily., Disp: 60 capsule, Rfl: 2   rosuvastatin (CRESTOR) 10 MG tablet, TAKE 1 TABLET BY MOUTH EVERY DAY N, Disp: 90 tablet, Rfl: 3   thiamine (VITAMIN B-1) 100 MG tablet, Take 100 mg by mouth every evening. , Disp: , Rfl:    thyroid (NP THYROID) 60 MG tablet, Take 1 tablet (60 mg total) by mouth daily before breakfast., Disp: 90 tablet, Rfl: 0   ticagrelor (BRILINTA) 90 MG TABS tablet, Take 1 tablet (90 mg total) by mouth 2 (two) times daily., Disp: 60 tablet, Rfl: 11  Past Medical History: Past Medical History:  Diagnosis Date   Carotid artery disease (HCC)    Colon polyp    COPD (chronic obstructive pulmonary disease) ([name])    Coronary artery disease    Multivessel status post CABG with LIMA to LAD and SVG to OM1 2017   Hyperlipidemia    Hypertension    Iliac artery aneurysm (HCC)    Myocardial infarction ([place])    Peripheral arterial disease (HCC)    Suboptimal revascularization options    Tobacco Use: Social History   Tobacco Use  Smoking Status Former  Packs/day: 2.00   Years: 30.00   Pack years: 60.00   Types: Cigarettes   Start date: 06/03/1961   Quit date: 11/17/1991   Years since quitting: 29.3  Smokeless Tobacco Former   Types: Snuff, Chew    Labs: Recent Review Flowsheet Data     Labs for ITP Cardiac and Pulmonary Rehab Latest Ref Rng & Units 05/23/2015 08/21/2017 07/18/2019 07/04/2020 12/22/2020   Cholestrol 0 - 200 mg/dL - - 127 139 96   LDLCALC 0 - 99 mg/dL - - 71 81 39   HDL >40 mg/dL - - 41 36(L) 31(L)   Trlycerides <150 mg/dL - - 74 128 130   Hemoglobin A1c 4.8 - 5.6 % - - - - -   PHART 7.350 - 7.450 - - - - -   PCO2ART 35.0 - 45.0 mmHg - - - - -   HCO3 20.0 - 24.0 mEq/L - - - - -   TCO2 22 - 32 mmol/L 23 24 - - -   ACIDBASEDEF 0.0 - 2.0 mmol/L - - - - -   O2SAT % - -  - - -       Capillary Blood Glucose: Lab Results  Component Value Date   GLUCAP 81 05/29/2015   GLUCAP 90 05/29/2015   GLUCAP 100 (H) 05/28/2015   GLUCAP 92 05/28/2015   GLUCAP 94 05/28/2015     Exercise Target Goals: Exercise Program Goal: Individual exercise prescription set using results from initial 6 min walk test and THRR while considering  patients activity barriers and safety.   Exercise Prescription Goal: Starting with aerobic activity 30 plus minutes a day, 3 days per week for initial exercise prescription. Provide home exercise prescription and guidelines that participant acknowledges understanding prior to discharge.  Activity Barriers & Risk Stratification:  Activity Barriers & Cardiac Risk Stratification - 03/07/21 1248       Activity Barriers & Cardiac Risk Stratification   Activity Barriers Arthritis;Deconditioning;Shortness of Breath    Cardiac Risk Stratification High             6 Minute Walk:  6 Minute Walk     Row Name 03/07/21 1327         6 Minute Walk   Phase Initial     Distance 600 feet     Walk Time 6 minutes     # of Rest Breaks 3     MPH 1.14     METS 1.13     RPE 15     VO2 Peak 3.96     Symptoms Yes (comment)     Comments Claudication 9/10 caused 3 standing breaks for 150 sec, 30 sec, and 30 sec     Resting HR 57 bpm     Resting BP 130/60     Resting Oxygen Saturation  97 %     Exercise Oxygen Saturation  during 6 min walk 96 %     Max Ex. HR 79 bpm     Max Ex. BP 138/60     2 Minute Post BP 120/64              Oxygen Initial Assessment:   Oxygen Re-Evaluation:   Oxygen Discharge (Final Oxygen Re-Evaluation):   Initial Exercise Prescription:  Initial Exercise Prescription - 03/07/21 1300       Date of Initial Exercise RX and Referring Provider   Date 03/07/21    Referring Provider Dr. Domenic Polite    Expected Discharge Date 05/31/21  NuStep   Level 1    SPM 60    Minutes 39      Prescription  Details   Frequency (times per week) 3    Duration Progress to 30 minutes of continuous aerobic without signs/symptoms of physical distress      Intensity   THRR 40-80% of Max Heartrate 56-113    Ratings of Perceived Exertion 11-13    Perceived Dyspnea 0-4      Resistance Training   Training Prescription Yes    Weight 3 lbs    Reps 10-15             Perform Capillary Blood Glucose checks as needed.  Exercise Prescription Changes:   Exercise Prescription Changes     Row Name 03/18/21 1100 03/29/21 1100           Response to Exercise   Blood Pressure (Admit) 150/50 138/50      Blood Pressure (Exercise) 148/50 130/50      Blood Pressure (Exit) 128/52 104/50      Heart Rate (Admit) 59 bpm 59 bpm      Heart Rate (Exercise) 82 bpm 78 bpm      Heart Rate (Exit) 68 bpm 66 bpm      Rating of Perceived Exertion (Exercise) 13 13      Duration Continue with 30 min of aerobic exercise without signs/symptoms of physical distress. Continue with 30 min of aerobic exercise without signs/symptoms of physical distress.      Intensity THRR unchanged THRR unchanged        Progression   Progression Continue to progress workloads to maintain intensity without signs/symptoms of physical distress. Continue to progress workloads to maintain intensity without signs/symptoms of physical distress.        Resistance Training   Training Prescription Yes Yes      Weight 3 3      Reps 10-15 10-15      Time 10 Minutes 10 Minutes        NuStep   Level 1 1      SPM 79 81      Minutes 22 22      METs 1.9 1.78        Arm Ergometer   Level 1 1      Minutes 17 22      METs 1.89 2.04               Exercise Comments:   Exercise Goals and Review:   Exercise Goals     Row Name 03/07/21 1330 04/02/21 1347           Exercise Goals   Increase Physical Activity Yes Yes      Intervention Provide advice, education, support and counseling about physical activity/exercise needs.;Develop  an individualized exercise prescription for aerobic and resistive training based on initial evaluation findings, risk stratification, comorbidities and participant's personal goals. Provide advice, education, support and counseling about physical activity/exercise needs.;Develop an individualized exercise prescription for aerobic and resistive training based on initial evaluation findings, risk stratification, comorbidities and participant's personal goals.      Expected Outcomes Short Term: Attend rehab on a regular basis to increase amount of physical activity.;Long Term: Exercising regularly at least 3-5 days a week.;Long Term: Add in home exercise to make exercise part of routine and to increase amount of physical activity. Short Term: Attend rehab on a regular basis to increase amount of physical activity.;Long Term: Exercising regularly at least 3-5 days a  week.;Long Term: Add in home exercise to make exercise part of routine and to increase amount of physical activity.      Increase Strength and Stamina Yes Yes      Intervention Provide advice, education, support and counseling about physical activity/exercise needs.;Develop an individualized exercise prescription for aerobic and resistive training based on initial evaluation findings, risk stratification, comorbidities and participant's personal goals. Provide advice, education, support and counseling about physical activity/exercise needs.;Develop an individualized exercise prescription for aerobic and resistive training based on initial evaluation findings, risk stratification, comorbidities and participant's personal goals.      Expected Outcomes Short Term: Increase workloads from initial exercise prescription for resistance, speed, and METs.;Short Term: Perform resistance training exercises routinely during rehab and add in resistance training at home;Long Term: Improve cardiorespiratory fitness, muscular endurance and strength as measured by  increased METs and functional capacity (6MWT) Short Term: Increase workloads from initial exercise prescription for resistance, speed, and METs.;Short Term: Perform resistance training exercises routinely during rehab and add in resistance training at home;Long Term: Improve cardiorespiratory fitness, muscular endurance and strength as measured by increased METs and functional capacity (6MWT)      Able to understand and use rate of perceived exertion (RPE) scale Yes Yes      Intervention Provide education and explanation on how to use RPE scale Provide education and explanation on how to use RPE scale      Expected Outcomes Short Term: Able to use RPE daily in rehab to express subjective intensity level;Long Term:  Able to use RPE to guide intensity level when exercising independently Short Term: Able to use RPE daily in rehab to express subjective intensity level;Long Term:  Able to use RPE to guide intensity level when exercising independently      Knowledge and understanding of Target Heart Rate Range (THRR) Yes Yes      Intervention Provide education and explanation of THRR including how the numbers were predicted and where they are located for reference Provide education and explanation of THRR including how the numbers were predicted and where they are located for reference      Expected Outcomes Short Term: Able to state/look up THRR;Long Term: Able to use THRR to govern intensity when exercising independently;Short Term: Able to use daily as guideline for intensity in rehab Short Term: Able to state/look up THRR;Long Term: Able to use THRR to govern intensity when exercising independently;Short Term: Able to use daily as guideline for intensity in rehab      Able to check pulse independently Yes Yes      Intervention Provide education and demonstration on how to check pulse in carotid and radial arteries.;Review the importance of being able to check your own pulse for safety during independent  exercise Provide education and demonstration on how to check pulse in carotid and radial arteries.;Review the importance of being able to check your own pulse for safety during independent exercise      Expected Outcomes Short Term: Able to explain why pulse checking is important during independent exercise;Long Term: Able to check pulse independently and accurately Short Term: Able to explain why pulse checking is important during independent exercise;Long Term: Able to check pulse independently and accurately      Understanding of Exercise Prescription Yes Yes      Intervention Provide education, explanation, and written materials on patient's individual exercise prescription Provide education, explanation, and written materials on patient's individual exercise prescription      Expected Outcomes Short Term:  Able to explain program exercise prescription;Long Term: Able to explain home exercise prescription to exercise independently Short Term: Able to explain program exercise prescription;Long Term: Able to explain home exercise prescription to exercise independently               Exercise Goals Re-Evaluation :  Exercise Goals Re-Evaluation     Row Name 04/02/21 1347 04/02/21 1348           Exercise Goal Re-Evaluation   Exercise Goals Review Increase Physical Activity;Increase Strength and Stamina;Able to understand and use rate of perceived exertion (RPE) scale;Knowledge and understanding of Target Heart Rate Range (THRR);Able to check pulse independently;Understanding of Exercise Prescription Increase Physical Activity;Increase Strength and Stamina;Able to understand and use rate of perceived exertion (RPE) scale;Knowledge and understanding of Target Heart Rate Range (THRR);Able to check pulse independently;Understanding of Exercise Prescription      Comments -- Pt has completed 9 sessions of cardiac rehab. His is severely deconditioned and is also limited by COPD and othropedic issues. He  gives good effort while here. He does have to take several breaks when exercising, but his breaks have decreased since starting the program. He is currently exercising at 2.04 METs on the stepper. Will continue to monitor and progress as able.      Expected Outcomes Through exercise at rehab and at home, the patient will meet their stated goals. Through exercise at rehab and at home, the patient will meet their stated goals.                Discharge Exercise Prescription (Final Exercise Prescription Changes):  Exercise Prescription Changes - 03/29/21 1100       Response to Exercise   Blood Pressure (Admit) 138/50    Blood Pressure (Exercise) 130/50    Blood Pressure (Exit) 104/50    Heart Rate (Admit) 59 bpm    Heart Rate (Exercise) 78 bpm    Heart Rate (Exit) 66 bpm    Rating of Perceived Exertion (Exercise) 13    Duration Continue with 30 min of aerobic exercise without signs/symptoms of physical distress.    Intensity THRR unchanged      Progression   Progression Continue to progress workloads to maintain intensity without signs/symptoms of physical distress.      Resistance Training   Training Prescription Yes    Weight 3    Reps 10-15    Time 10 Minutes      NuStep   Level 1    SPM 81    Minutes 22    METs 1.78      Arm Ergometer   Level 1    Minutes 22    METs 2.04             Nutrition:  Target Goals: Understanding of nutrition guidelines, daily intake of sodium 1500mg , cholesterol 200mg , calories 30% from fat and 7% or less from saturated fats, daily to have 5 or more servings of fruits and vegetables.  Biometrics:  Pre Biometrics - 03/07/21 1331       Pre Biometrics   Height 5\' 6"  (1.676 m)    Weight 71.1 kg    Waist Circumference 40 inches    Hip Circumference 37.5 inches    Waist to Hip Ratio 1.07 %    BMI (Calculated) 25.31    Triceps Skinfold 28 mm    % Body Fat 29.9 %    Flexibility 0 in    Single Leg Stand 3 seconds  Nutrition Therapy Plan and Nutrition Goals:  Nutrition Therapy & Goals - 03/07/21 1311       Personal Nutrition Goals   Comments Patient scored 26 on his diet assessment. Handout provided and explained regarding healthier choices. We offer 2 educational sessions on heart healthy nutrition with handouts and assistance with RD referral if patient is interested.      Intervention Plan   Intervention Nutrition handout(s) given to patient.    Expected Outcomes Short Term Goal: Understand basic principles of dietary content, such as calories, fat, sodium, cholesterol and nutrients.             Nutrition Assessments:  Nutrition Assessments - 03/07/21 1311       MEDFICTS Scores   Pre Score 26            MEDIFICTS Score Key: ?70 Need to make dietary changes  40-70 Heart Healthy Diet ? 40 Therapeutic Level Cholesterol Diet   Picture Your Plate Scores: <96 Unhealthy dietary pattern with much room for improvement. 41-50 Dietary pattern unlikely to meet recommendations for good health and room for improvement. 51-60 More healthful dietary pattern, with some room for improvement.  >60 Healthy dietary pattern, although there may be some specific behaviors that could be improved.    Nutrition Goals Re-Evaluation:   Nutrition Goals Discharge (Final Nutrition Goals Re-Evaluation):   Psychosocial: Target Goals: Acknowledge presence or absence of significant depression and/or stress, maximize coping skills, provide positive support system. Participant is able to verbalize types and ability to use techniques and skills needed for reducing stress and depression.  Initial Review & Psychosocial Screening:  Initial Psych Review & Screening - 03/07/21 1358       Initial Review   Current issues with None Identified      Family Dynamics   Good Support System? Yes      Barriers   Psychosocial barriers to participate in program There are no identifiable barriers or  psychosocial needs.      Screening Interventions   Interventions Encouraged to exercise    Expected Outcomes Short Term goal: Utilizing psychosocial counselor, staff and physician to assist with identification of specific Stressors or current issues interfering with healing process. Setting desired goal for each stressor or current issue identified.             Quality of Life Scores:  Quality of Life - 03/07/21 1326       Quality of Life   Select Quality of Life      Quality of Life Scores   Health/Function Pre 19.14 %    Socioeconomic Pre 19.92 %    Psych/Spiritual Pre 22.5 %    Family Pre 24 %    GLOBAL Pre 20.68 %            Scores of 19 and below usually indicate a poorer quality of life in these areas.  A difference of  2-3 points is a clinically meaningful difference.  A difference of 2-3 points in the total score of the Quality of Life Index has been associated with significant improvement in overall quality of life, self-image, physical symptoms, and general health in studies assessing change in quality of life.  PHQ-9: Recent Review Flowsheet Data     Depression screen [facility] 2/9 03/07/2021 10/03/2020 07/04/2020 01/26/2020 07/18/2019   Decreased Interest 3 0 0 0 0   Down, Depressed, Hopeless 0 0 0 0 0   PHQ - 2 Score 3 0 0 0 0   Altered  sleeping 0 0 0 - -   Tired, decreased energy 3 0 0 - -   Change in appetite 0 0 0 - -   Feeling bad or failure about yourself  0 0 0 - -   Trouble concentrating 0 0 0 - -   Moving slowly or fidgety/restless 0 0 0 - -   Suicidal thoughts 0 0 0 - -   PHQ-9 Score 6 0 0 - -   Difficult doing work/chores Somewhat difficult Not difficult at all Not difficult at all - -      Interpretation of Total Score  Total Score Depression Severity:  1-4 = Minimal depression, 5-9 = Mild depression, 10-14 = Moderate depression, 15-19 = Moderately severe depression, 20-27 = Severe depression   Psychosocial Evaluation and Intervention:   Psychosocial Evaluation - 03/07/21 1358       Psychosocial Evaluation & Interventions   Interventions Stress management education;Relaxation education;Encouraged to exercise with the program and follow exercise prescription    Comments Patient has no psychosocial barriers or issues identified at his orientation visit. He scored 6 on his PHQ-9 mainly due to his fatigue and not feeling like doing any activities mentally or physically. His overall QOL was 20.68% scoring lowest in health/function at 19.14%. He denies any depression or anxiety. He lives alone. His wife expired in 2014 due to cancer. He has a son and daughter that he says he if very close to and names as his support people. He has 4 grandchildren. He demonstrates an interest in improving his health and is looking forward to participaing in the program hoping he will gain some strength and energy and be able to do his ADL's.    Expected Outcomes Patient will continue to have no psychosocial barriers or issues identified.    Continue Psychosocial Services  No Follow up required             Psychosocial Re-Evaluation:  Psychosocial Re-Evaluation     [place] Name 03/20/21 1352             Psychosocial Re-Evaluation   Current issues with None Identified       Comments Patient continues to have no psychosocial barriers or issues identified. He is new to the program completing 5 sessions. He seems to enjoy coming to the sessions and demonstrates an interest in improving his health. We will continue to monitor.       Expected Outcomes Patient will continue to have no psychosocial barriers or issues idenfitied.       Interventions Encouraged to attend Cardiac Rehabilitation for the exercise;Relaxation education;Stress management education       Continue Psychosocial Services  No Follow up required                Psychosocial Discharge (Final Psychosocial Re-Evaluation):  Psychosocial Re-Evaluation - 03/20/21 1352        Psychosocial Re-Evaluation   Current issues with None Identified    Comments Patient continues to have no psychosocial barriers or issues identified. He is new to the program completing 5 sessions. He seems to enjoy coming to the sessions and demonstrates an interest in improving his health. We will continue to monitor.    Expected Outcomes Patient will continue to have no psychosocial barriers or issues idenfitied.    Interventions Encouraged to attend Cardiac Rehabilitation for the exercise;Relaxation education;Stress management education    Continue Psychosocial Services  No Follow up required  Vocational Rehabilitation: Provide vocational rehab assistance to qualifying candidates.   Vocational Rehab Evaluation & Intervention:  Vocational Rehab - 03/07/21 1311       Initial Vocational Rehab Evaluation & Intervention   Assessment shows need for Vocational Rehabilitation No      Vocational Rehab Re-Evaulation   Comments Patient is retired and does not need vocational rehab.             Education: Education Goals: Education classes will be provided on a weekly basis, covering required topics. Participant will state understanding/return demonstration of topics presented.  Learning Barriers/Preferences:   Education Topics: Hypertension, Hypertension Reduction -Define heart disease and high blood pressure. Discus how high blood pressure affects the body and ways to reduce high blood pressure. Flowsheet Row CARDIAC REHAB PHASE II EXERCISE from 09/26/2015 in [place]  Date 08/01/15  Educator Russella Dar  Instruction Review Code (retired) 2- meets goals/outcomes       Exercise and Your Heart -Discuss why it is important to exercise, the FITT principles of exercise, normal and abnormal responses to exercise, and how to exercise safely. Flowsheet Row CARDIAC REHAB PHASE II EXERCISE from 09/26/2015 in [place]  Date  08/08/15  Educator DC  Instruction Review Code (retired) 2- meets goals/outcomes       Angina -Discuss definition of angina, causes of angina, treatment of angina, and how to decrease risk of having angina. Flowsheet Row CARDIAC REHAB PHASE II EXERCISE from 09/26/2015 in [place]  Date 08/15/15  Educator D Coad  Instruction Review Code 2- meets goals/outcomes       Cardiac Medications -Review what the following cardiac medications are used for, how they affect the body, and side effects that may occur when taking the medications.  Medications include Aspirin, Beta blockers, calcium channel blockers, ACE Inhibitors, angiotensin receptor blockers, diuretics, digoxin, and antihyperlipidemics. Flowsheet Row CARDIAC REHAB PHASE II EXERCISE from 09/26/2015 in [place]  Date 08/22/15  Educator D. Coad  Instruction Review Code 2- meets goals/outcomes       Congestive Heart Failure -Discuss the definition of CHF, how to live with CHF, the signs and symptoms of CHF, and how keep track of weight and sodium intake. Flowsheet Row CARDIAC REHAB PHASE II EXERCISE from 09/26/2015 in [place]  Date 08/29/15  Educator DC  Instruction Review Code 2- meets goals/outcomes       Heart Disease and Intimacy -Discus the effect sexual activity has on the heart, how changes occur during intimacy as we age, and safety during sexual activity. Flowsheet Row CARDIAC REHAB PHASE II EXERCISE from 09/26/2015 in [place]  Date 09/05/15  Educator DC  Instruction Review Code (retired) 2- meets goals/outcomes       Smoking Cessation / COPD -Discuss different methods to quit smoking, the health benefits of quitting smoking, and the definition of COPD. Flowsheet Row CARDIAC REHAB PHASE II EXERCISE from 09/26/2015 in [place]  Date 09/12/15  Educator Russella Dar  Instruction Review Code  (retired) 2- meets goals/outcomes       Nutrition I: Fats -Discuss the types of cholesterol, what cholesterol does to the heart, and how cholesterol levels can be controlled. Flowsheet Row CARDIAC REHAB PHASE II EXERCISE from 09/26/2015 in [place]  Date 09/19/15  Educator Russella Dar  Instruction Review Code 2- meets goals/outcomes       Nutrition II: Labels -Discuss the different components of food  labels and how to read food label [place] from 09/26/2015 in [place]  Date 09/26/15  Educator Russella Dar  Instruction Review Code 2- meets goals/outcomes       Heart Parts/Heart Disease and PAD -Discuss the anatomy of the heart, the pathway of blood circulation through the heart, and these are affected by heart disease. Flowsheet Row CARDIAC REHAB PHASE II EXERCISE from 03/27/2021 in [place]  Date 03/13/21  Educator pb  Instruction Review Code 1- Verbalizes Understanding       Stress I: Signs and Symptoms -Discuss the causes of stress, how stress may lead to anxiety and depression, and ways to limit stress. Flowsheet Row CARDIAC REHAB PHASE II EXERCISE from 09/26/2015 in [place]  Date 07/11/15  Educator D Coad  Instruction Review Code 2- meets goals/outcomes       Stress II: Relaxation -Discuss different types of relaxation techniques to limit stress. Flowsheet Row CARDIAC REHAB PHASE II EXERCISE from 03/27/2021 in [place]  Date 03/27/21  Educator DF  Instruction Review Code 2- Demonstrated Understanding       Warning Signs of Stroke / TIA -Discuss definition of a stroke, what the signs and symptoms are of a stroke, and how to identify when someone is having stroke. Flowsheet Row CARDIAC REHAB PHASE II EXERCISE from 09/26/2015 in [place]  Date 07/25/15  Educator Russella Dar   Instruction Review Code 2- meets goals/outcomes       Knowledge Questionnaire Score:  Knowledge Questionnaire Score - 03/07/21 1311       Knowledge Questionnaire Score   Pre Score 21/24             Core Components/Risk Factors/Patient Goals at Admission:  Personal Goals and Risk Factors at Admission - 03/07/21 1355       Core Components/Risk Factors/Patient Goals on Admission    Weight Management Obesity    Improve shortness of breath with ADL's Yes    Intervention Provide education, individualized exercise plan and daily activity instruction to help decrease symptoms of SOB with activities of daily living.    Expected Outcomes Short Term: Improve cardiorespiratory fitness to achieve a reduction of symptoms when performing ADLs;Long Term: Be able to perform more ADLs without symptoms or delay the onset of symptoms    Hypertension Yes    Intervention Monitor prescription use compliance.;Provide education on lifestyle modifcations including regular physical activity/exercise, weight management, moderate sodium restriction and increased consumption of fresh fruit, vegetables, and low fat dairy, alcohol moderation, and smoking cessation.    Expected Outcomes Long Term: Maintenance of blood pressure at goal levels.    Lipids Yes    Intervention Provide education and support for participant on nutrition & aerobic/resistive exercise along with prescribed medications to achieve LDL 70mg , HDL >40mg .    Expected Outcomes Long Term: Cholesterol controlled with medications as prescribed, with individualized exercise RX and with personalized nutrition plan. Value goals: LDL < 70mg , HDL > 40 mg.    Personal Goal Other Yes    Personal Goal Patient wants to get stronger; have more energy and be able to do his ADL's    Intervention Paitent will attend the CR program with exercise and education for lifestyle modification.    Expected Outcomes Patient will complete the program meeting both  program and personal goals.             Core Components/Risk Factors/Patient Goals Review:  Goals and Risk Factor Review     Row Name 03/20/21 1353             Core Components/Risk Factors/Patient Goals Review   Personal Goals Review Hypertension;Lipids;Improve shortness of breath with ADL's;Other       Review Patient was referred to CR with Stent placment. He has multiple risk factors for CAD and is participating in the program for risk modification. He is new to the program completing 5 sessions losing with his initial weight of 156.0 and his current weight of 155.5. He does have to stop multiple time during exercise due to SOB, lower extremity pain and fatigue. His personal goals for the program are to increase his strength and energy and be able to do his ADL's. We will continues to monitor his progress as he works towards meeting these goals.       Expected Outcomes Patient will complete the program meeting both program and personal goals.                Core Components/Risk Factors/Patient Goals at Discharge (Final Review):   Goals and Risk Factor Review - 03/20/21 1353       Core Components/Risk Factors/Patient Goals Review   Personal Goals Review Hypertension;Lipids;Improve shortness of breath with ADL's;Other    Review Patient was referred to CR with Stent placment. He has multiple risk factors for CAD and is participating in the program for risk modification. He is new to the program completing 5 sessions losing with his initial weight of 156.0 and his current weight of 155.5. He does have to stop multiple time during exercise due to SOB, lower extremity pain and fatigue. His personal goals for the program are to increase his strength and energy and be able to do his ADL's. We will continues to monitor his progress as he works towards meeting these goals.    Expected Outcomes Patient will complete the program meeting both program and personal goals.              ITP Comments:   Comments: ITP REVIEW Pt is making expected progress toward Cardiac Rehab goals after completing 9 sessions. Recommend continued exercise, life style modification, education, and increased stamina and strength.

## 2021-04-03 NOTE — Progress Notes (Signed)
Daily Session Note  Patient Details  Name: William [name]. MRN: [id number] Date of Birth: [birth year] Referring Provider:   Flowsheet Row CARDIAC REHAB PHASE II ORIENTATION from 03/07/2021 in [place]  Referring Provider Dr. Domenic Polite       Encounter Date: 04/03/2021  Check In:  Session Check In - 04/03/21 1100       Check-In   Supervising physician immediately available to respond to emergencies [facility] MD immediately available    Physician(s) Dr. Harl Bowie    Location [facility]    Staff Present Hoy Register, MS, ACSM-CEP, Exercise Physiologist;Heather Zigmund Daniel, Exercise Physiologist;Other    Virtual Visit No    Medication changes reported     No    Fall or balance concerns reported    Yes    Comments Patient reports losing his balance at times and feeling dizzy if he gets up too fast.    Tobacco Cessation No Change    Warm-up and Cool-down Performed as group-led instruction    Resistance Training Performed Yes    VAD Patient? No    PAD/SET Patient? No      Pain Assessment   Currently in Pain? No/denies    Pain Score 0-No pain    Multiple Pain Sites No             Capillary Blood Glucose: No results found for this or any previous visit (from the past 24 hour(s)).    Social History   Tobacco Use  Smoking Status Former   Packs/day: 2.00   Years: 30.00   Pack years: 60.00   Types: Cigarettes   Start date: 06/03/1961   Quit date: 11/17/1991   Years since quitting: 29.3  Smokeless Tobacco Former   Types: Snuff, Chew    Goals Met:  Independence with exercise equipment Exercise tolerated well No report of concerns or symptoms today Strength training completed today  Goals Unmet:  Not Applicable  Comments: checkout time is 1200   Dr. Kathie Dike is Medical Director for [facility].

## 2021-04-05 ENCOUNTER — Encounter (HOSPITAL_COMMUNITY)
Admission: RE | Admit: 2021-04-05 | Discharge: 2021-04-05 | Disposition: A | Discharge status: Home / Self Care | Payer: Medicare Other | Source: Ambulatory Visit | Attending: Cardiology | Admitting: Cardiology

## 2021-04-05 DIAGNOSIS — Z955 Presence of coronary angioplasty implant and graft: Secondary | ICD-10-CM

## 2021-04-05 NOTE — Progress Notes (Signed)
Daily Session Note  Patient Details  Name: William [name]. MRN: [id number] Date of Birth: 25-Mar-1941 Referring Provider:   Flowsheet Row CARDIAC REHAB PHASE II ORIENTATION from 03/07/2021 in [place]  Referring Provider Dr. Domenic Polite       Encounter Date: 04/05/2021  Check In:  Session Check In - 04/05/21 1100       Check-In   Supervising physician immediately available to respond to emergencies [facility] MD immediately available    Physician(s) Dr. Alda Lea    Location [facility]    Staff Present Geanie Cooley, RN;Dalton Fletcher, MS, ACSM-CEP, Exercise Physiologist;Debra Wynetta Emery, RN, BSN    Virtual Visit No    Medication changes reported     No    Fall or balance concerns reported    Yes    Comments Patient reports losing his balance at times and feeling dizzy if he gets up too fast.    Tobacco Cessation No Change    Warm-up and Cool-down Performed as group-led instruction    Resistance Training Performed Yes    VAD Patient? No    PAD/SET Patient? No      Pain Assessment   Currently in Pain? No/denies    Pain Score 0-No pain    Multiple Pain Sites No             Capillary Blood Glucose: No results found for this or any previous visit (from the past 24 hour(s)).    Social History   Tobacco Use  Smoking Status Former   Packs/day: 2.00   Years: 30.00   Pack years: 60.00   Types: Cigarettes   Start date: 06/03/1961   Quit date: 11/17/1991   Years since quitting: 29.4  Smokeless Tobacco Former   Types: Snuff, Chew    Goals Met:  Independence with exercise equipment Exercise tolerated well No report of concerns or symptoms today Strength training completed today  Goals Unmet:  Not Applicable  Comments: check out @ 12:00pm   Dr. Kathie Dike is Medical Director for [facility].

## 2021-04-08 ENCOUNTER — Encounter (HOSPITAL_COMMUNITY)
Admission: RE | Admit: 2021-04-08 | Discharge: 2021-04-08 | Disposition: A | Discharge status: Home / Self Care | Payer: Medicare Other | Source: Ambulatory Visit | Attending: Cardiology | Admitting: Cardiology

## 2021-04-08 DIAGNOSIS — Z955 Presence of coronary angioplasty implant and graft: Secondary | ICD-10-CM

## 2021-04-08 NOTE — Progress Notes (Signed)
Daily Session Note  Patient Details  Name: William [name]. MRN: [id number] Date of Birth: 21-Feb-1941 Referring Provider:   Flowsheet Row CARDIAC REHAB PHASE II ORIENTATION from 03/07/2021 in [place]  Referring Provider Dr. Domenic Polite       Encounter Date: 04/08/2021  Check In:  Session Check In - 04/08/21 1100       Check-In   Supervising physician immediately available to respond to emergencies [facility] MD immediately available    Physician(s) Dr. Harrington Challenger    Location [facility]    Staff Present Hoy Register, MS, ACSM-CEP, Exercise Physiologist;Debra Wynetta Emery, RN, BSN;Heather Otho Ket, BS, Exercise Physiologist    Virtual Visit No    Medication changes reported     No    Fall or balance concerns reported    Yes    Comments Patient reports losing his balance at times and feeling dizzy if he gets up too fast.    Tobacco Cessation No Change    Warm-up and Cool-down Performed as group-led instruction    Resistance Training Performed Yes    VAD Patient? No    PAD/SET Patient? No      Pain Assessment   Currently in Pain? No/denies    Pain Score 0-No pain    Multiple Pain Sites No             Capillary Blood Glucose: No results found for this or any previous visit (from the past 24 hour(s)).    Social History   Tobacco Use  Smoking Status Former   Packs/day: 2.00   Years: 30.00   Pack years: 60.00   Types: Cigarettes   Start date: 06/03/1961   Quit date: 11/17/1991   Years since quitting: 29.4  Smokeless Tobacco Former   Types: Snuff, Chew    Goals Met:  Independence with exercise equipment Exercise tolerated well No report of concerns or symptoms today Strength training completed today  Goals Unmet:  Not Applicable  Comments: checkout time is 1200   Dr. Kathie Dike is Medical Director for [facility] Re[facility].

## 2021-04-10 ENCOUNTER — Encounter (HOSPITAL_COMMUNITY)
Admission: RE | Admit: 2021-04-10 | Discharge: 2021-04-10 | Disposition: A | Discharge status: Home / Self Care | Payer: Medicare Other | Source: Ambulatory Visit | Attending: Cardiology | Admitting: Cardiology

## 2021-04-10 DIAGNOSIS — Z955 Presence of coronary angioplasty implant and graft: Secondary | ICD-10-CM

## 2021-04-10 NOTE — Progress Notes (Signed)
Daily Session Note  Patient Details  Name: William [name]. MRN: [id number] Date of Birth: 30-Dec-1940 Referring Provider:   Flowsheet Row CARDIAC REHAB PHASE II ORIENTATION from 03/07/2021 in [place]  Referring Provider Dr. Domenic Polite       Encounter Date: 04/10/2021  Check In:  Session Check In - 04/10/21 1100       Check-In   Supervising physician immediately available to respond to emergencies [facility] MD immediately available    Physician(s) Gardiner Rhyme    Location [facility]    Staff Present Hoy Register, MS, ACSM-CEP, Exercise Physiologist;Heather Otho Ket, BS, Exercise Physiologist;Eyden Dobie Wynetta Emery, RN, BSN    Virtual Visit No    Medication changes reported     No    Fall or balance concerns reported    Yes    Comments Patient reports losing his balance at times and feeling dizzy if he gets up too fast.    Tobacco Cessation No Change    Warm-up and Cool-down Performed as group-led instruction    Resistance Training Performed Yes    VAD Patient? No    PAD/SET Patient? No      Pain Assessment   Currently in Pain? No/denies    Pain Score 0-No pain    Multiple Pain Sites No             Capillary Blood Glucose: No results found for this or any previous visit (from the past 24 hour(s)).    Social History   Tobacco Use  Smoking Status Former   Packs/day: 2.00   Years: 30.00   Pack years: 60.00   Types: Cigarettes   Start date: 06/03/1961   Quit date: 11/17/1991   Years since quitting: 29.4  Smokeless Tobacco Former   Types: Snuff, Chew    Goals Met:  Independence with exercise equipment Exercise tolerated well No report of concerns or symptoms today Strength training completed today  Goals Unmet:  Not Applicable  Comments: Check out 1200.   Dr. Kathie Dike is Medical Director for [facility] Pulmonary Re[facility].

## 2021-04-11 ENCOUNTER — Encounter: Payer: Self-pay | Admitting: Nurse Practitioner

## 2021-04-11 ENCOUNTER — Ambulatory Visit (INDEPENDENT_AMBULATORY_CARE_PROVIDER_SITE_OTHER): Payer: Medicare Other | Admitting: Nurse Practitioner

## 2021-04-11 ENCOUNTER — Other Ambulatory Visit: Payer: Self-pay

## 2021-04-11 VITALS — BP 140/66 | HR 59 | Temp 97.7°F | Ht 66.0 in | Wt 158.4 lb

## 2021-04-11 DIAGNOSIS — J449 Chronic obstructive pulmonary disease, unspecified: Secondary | ICD-10-CM

## 2021-04-11 DIAGNOSIS — K219 Gastro-esophageal reflux disease without esophagitis: Secondary | ICD-10-CM | POA: Diagnosis not present

## 2021-04-11 DIAGNOSIS — I25118 Atherosclerotic heart disease of native coronary artery with other forms of angina pectoris: Secondary | ICD-10-CM

## 2021-04-11 DIAGNOSIS — E559 Vitamin D deficiency, unspecified: Secondary | ICD-10-CM | POA: Diagnosis not present

## 2021-04-11 DIAGNOSIS — R5383 Other fatigue: Secondary | ICD-10-CM | POA: Diagnosis not present

## 2021-04-11 LAB — TSH: TSH: 0.51 u[IU]/mL (ref 0.35–5.50)

## 2021-04-11 LAB — CBC WITH DIFFERENTIAL/PLATELET
Basophils Absolute: 0.1 10*3/uL (ref 0.0–0.1)
Basophils Relative: 0.9 % (ref 0.0–3.0)
Eosinophils Absolute: 0.6 10*3/uL (ref 0.0–0.7)
Eosinophils Relative: 6.7 % — ABNORMAL HIGH (ref 0.0–5.0)
HCT: 39.4 % (ref 39.0–52.0)
Hemoglobin: 13.1 g/dL (ref 13.0–17.0)
Lymphocytes Relative: 16.7 % (ref 12.0–46.0)
Lymphs Abs: 1.5 10*3/uL (ref 0.7–4.0)
MCHC: 33.1 g/dL (ref 30.0–36.0)
MCV: 93.2 fl (ref 78.0–100.0)
Monocytes Absolute: 0.9 10*3/uL (ref 0.1–1.0)
Monocytes Relative: 9.8 % (ref 3.0–12.0)
Neutro Abs: 5.9 10*3/uL (ref 1.4–7.7)
Neutrophils Relative %: 65.9 % (ref 43.0–77.0)
Platelets: 297 10*3/uL (ref 150.0–400.0)
RBC: 4.23 Mil/uL (ref 4.22–5.81)
RDW: 13.8 % (ref 11.5–15.5)
WBC: 8.9 10*3/uL (ref 4.0–10.5)

## 2021-04-11 LAB — T3, FREE: T3, Free: 4.4 pg/mL — ABNORMAL HIGH (ref 2.3–4.2)

## 2021-04-11 LAB — VITAMIN D 25 HYDROXY (VIT D DEFICIENCY, FRACTURES): VITD: 44.31 ng/mL (ref 30.00–100.00)

## 2021-04-11 LAB — T4, FREE: Free T4: 0.82 ng/dL (ref 0.60–1.60)

## 2021-04-11 NOTE — Progress Notes (Signed)
Subjective:  Patient ID: William [name]., male    DOB: 1940/07/22  Age: 81 y.o. MRN: [id number]  CC:  Chief Complaint  Patient presents with   Transitions Of Care      HPI  This patient arrives today for the above.  He used to see me at his previous primary care provider's office prior to the office closing down.  He is here today to transition to care here at this office.  He tells me overall he is feeling well but he still experiencing shortness of breath.    He does see pulmonology for treatment of his COPD.    He also has significant coronary artery disease and tells me he underwent 3 additional stent placement since last time I saw him.  He tells me his chest pain is much improved since having the stents placed but he still experiences shortness of breath especially with exertion.  He is planning on doing cardiac rehab and is hoping this will help with his breathing.    In addition he has GERD and is currently on over-the-counter Prilosec 20 mg in the morning and over-the-counter Pepcid 20 mg in the evening.  He tells me his heartburn is managed a bit better since starting this medication.  He is wondering if he should continue taking this indefinitely.  He tells me he does have a history of hiatal hernia and has been evaluated by gastroenterology but its been "years" ago since his last evaluation.    He also continues on NP thyroid for the off label treatment of his fatigue.  It is been sometime since thyroid panel was checked so he will be due for this today.  Past Medical History:  Diagnosis Date   Carotid artery disease (HCC)    Colon polyp    COPD (chronic obstructive pulmonary disease) (HCC)    Coronary artery disease    Multivessel status post CABG with LIMA to LAD and SVG to OM1 2017   Hyperlipidemia    Hypertension    Iliac artery aneurysm (HCC)    Myocardial infarction ([place])    Peripheral arterial disease (HCC)    Suboptimal revascularization options       Family History  Problem Relation Age of Onset   Heart disease Father        Heart Disease before age 65   Pulmonary embolism Father    Deep vein thrombosis Father    Cancer Mother        Sarcoma    Social History   Social History Narrative   Retired used to work in [employer]. Widower. Lives alone.   Social History   Tobacco Use   Smoking status: Former    Packs/day: 2.00    Years: 30.00    Pack years: 60.00    Types: Cigarettes    Start date: 06/03/1961    Quit date: 11/17/1991    Years since quitting: 29.4   Smokeless tobacco: Former    Types: Snuff, Chew  Substance Use Topics   Alcohol use: Not Currently    Alcohol/week: 2.0 - 4.0 standard drinks    Types: 2 - 4 Standard drinks or equivalent per week    Comment: used to, quit 2012     Current Meds  Medication Sig   amLODipine (NORVASC) 10 MG tablet Take 1 tablet (10 mg total) by mouth daily.   Ascorbic Acid (VITAMIN C) 1000 MG tablet Take 1,000 mg by mouth in the morning.  aspirin EC 81 MG tablet Take 1 tablet (81 mg total) by mouth daily.   cholecalciferol (VITAMIN D) 25 MCG (1000 UNIT) tablet Take 1,000 Units by mouth in the morning.   ezetimibe (ZETIA) 10 MG tablet Take 1 tablet (10 mg total) by mouth daily.   famotidine (PEPCID) 20 MG tablet Take 20 mg by mouth every evening.   fish oil-omega-3 fatty acids 1000 MG capsule Take 1,000 mg by mouth in the morning.   folic acid (FOLVITE) 423 MCG tablet Take 400 mcg by mouth in the morning.   Garlic 5361 MG TBEC Take 2,000 mg by mouth in the morning.   isosorbide mononitrate (IMDUR) 60 MG 24 hr tablet Take 1 tablet (60 mg total) by mouth daily.   metoprolol tartrate (LOPRESSOR) 50 MG tablet Take 1 tablet (50 mg total) by mouth 2 (two) times daily.   Multiple Vitamin (MULTIVITAMIN WITH MINERALS) TABS tablet Take 1 tablet by mouth in the morning.   nitroGLYCERIN (NITROSTAT) 0.4 MG SL tablet Place 1 tablet (0.4 mg total) under the tongue every 5 (five)  minutes as needed for chest pain. If pain does not resolve with rest.   olmesartan (BENICAR) 40 MG tablet Take 1 tablet (40 mg total) by mouth daily.   omega-3 acid ethyl esters (LOVAZA) 1 g capsule Take 1 capsule (1 g total) by mouth daily.   omeprazole (PRILOSEC) 20 MG capsule Take 20 mg by mouth every morning.   rosuvastatin (CRESTOR) 10 MG tablet TAKE 1 TABLET BY MOUTH EVERY DAY N   thiamine (VITAMIN B-1) 100 MG tablet Take 100 mg by mouth every evening.     ROS:  Review of Systems  Constitutional:  Positive for malaise/fatigue.  Respiratory:  Positive for shortness of breath.   Cardiovascular:  Negative for chest pain.  Gastrointestinal:  Negative for abdominal pain, blood in stool and diarrhea.  Neurological:  Negative for dizziness.    Objective:   Today's Vitals: BP 140/66 (BP Location: Left Arm, Patient Position: Sitting, Cuff Size: Normal)    Pulse (!) 59    Temp 97.7 F (36.5 C) (Oral)    Ht 5\' 6"  (1.676 m)    Wt 158 lb 6.4 oz (71.8 kg)    SpO2 97%    BMI 25.57 kg/m  Vitals with BMI 04/11/2021 03/26/2021 03/18/2021  Height 5\' 6"  5\' 6"  -  Weight 158 lbs 6 oz 159 lbs 2 oz 155 lbs 7 oz  BMI 25.58 44.31 54.0  Systolic 086 761 -  Diastolic 66 78 -  Pulse 59 65 -     Physical Exam Vitals reviewed.  Constitutional:      Appearance: Normal appearance.  HENT:     Head: Normocephalic and atraumatic.  Neck:     Vascular: No carotid bruit.  Cardiovascular:     Rate and Rhythm: Normal rate and regular rhythm.  Pulmonary:     Effort: Pulmonary effort is normal.     Breath sounds: Normal breath sounds.  Musculoskeletal:     Cervical back: Neck supple.  Skin:    General: Skin is warm and dry.  Neurological:     Mental Status: He is alert and oriented to person, place, and time.  Psychiatric:        Mood and Affect: Mood normal.        Behavior: Behavior normal.        Thought Content: Thought content normal.        Judgment: Judgment normal.  Assessment  and Plan   1. Coronary artery disease of native heart with stable angina pectoris, unspecified vessel or lesion type ([place])   2. Fatigue, unspecified type   3. Vitamin D deficiency   4. Gastroesophageal reflux disease, unspecified whether esophagitis present   5. Chronic obstructive pulmonary disease, unspecified COPD type ([place])      Plan: 1.  Encouraged to continue going to cardiac rehab as scheduled as this may very well help with his shortness of breath. 2.  We will check blood work today for further evaluation.  We did have a risk versus benefit discussion regarding taking NP thyroid for off label treatment of fatigue.  We especially discussed risk of arrhythmias, heart failure, osteoporosis.  We will discuss whether or not he will continue on this medication pending thyroid results and blood work today. 3.  We will check vitamin D level today as he continues on a vitamin D supplement. 4.  He continue taking his over-the-counter Prilosec and Pepcid for at least another month.  We then discussed trialing off of it and if symptoms return considering going to see gastroenterologist for further evaluation.  He seems hesitant to see gastroenterologist, we did discuss that longstanding GERD can sometimes lead to precancerous and cancerous changes into the esophagus and that his symptoms could also be caused by other etiologies such as ulcers.  Thus, if he decides to not go to gastroenterology he is at risk for missed diagnoses.  He tells me he understands and will consider referral and will let me know if he would like 1. 5.  He will follow-up with pulmonology as scheduled.  Tests ordered Orders Placed This Encounter  Procedures   T3, free   T4, free   TSH   CBC with Differential/Platelet   VITAMIN D 25 Hydroxy (Vit-D Deficiency, Fractures)      No orders of the defined types were placed in this encounter.   Patient to follow-up in 3 months or sooner as needed.  Ailene Ards, NP

## 2021-04-12 ENCOUNTER — Encounter (HOSPITAL_COMMUNITY)
Admission: RE | Admit: 2021-04-12 | Discharge: 2021-04-12 | Disposition: A | Discharge status: Home / Self Care | Payer: Medicare Other | Source: Ambulatory Visit | Attending: Cardiology | Admitting: Cardiology

## 2021-04-12 DIAGNOSIS — Z23 Encounter for immunization: Secondary | ICD-10-CM | POA: Diagnosis not present

## 2021-04-12 DIAGNOSIS — Z955 Presence of coronary angioplasty implant and graft: Secondary | ICD-10-CM | POA: Diagnosis not present

## 2021-04-12 NOTE — Progress Notes (Signed)
Daily Session Note  Patient Details  Name: William [name]. MRN: [id number] Date of Birth: 11/07/1940 Referring Provider:   Flowsheet Row CARDIAC REHAB PHASE II ORIENTATION from 03/07/2021 in [place]  Referring Provider Dr. Domenic Polite       Encounter Date: 04/12/2021  Check In:  Session Check In - 04/12/21 1100       Check-In   Supervising physician immediately available to respond to emergencies [facility] MD immediately available    Physician(s) Dr. Domenic Polite    Location [facility]    Staff Present Hoy Register, MS, ACSM-CEP, Exercise Physiologist;Heather Otho Ket, BS, Exercise Physiologist;Shantinique Picazo Wynetta Emery, RN, BSN    Virtual Visit No    Medication changes reported     No    Fall or balance concerns reported    Yes    Comments Patient reports losing his balance at times and feeling dizzy if he gets up too fast.    Tobacco Cessation No Change    Warm-up and Cool-down Performed as group-led instruction    Resistance Training Performed Yes    VAD Patient? No    PAD/SET Patient? No      Pain Assessment   Currently in Pain? No/denies    Pain Score 0-No pain    Multiple Pain Sites No             Capillary Blood Glucose: No results found for this or any previous visit (from the past 24 hour(s)).    Social History   Tobacco Use  Smoking Status Former   Packs/day: 2.00   Years: 30.00   Pack years: 60.00   Types: Cigarettes   Start date: 06/03/1961   Quit date: 11/17/1991   Years since quitting: 29.4  Smokeless Tobacco Former   Types: Snuff, Chew    Goals Met:  Independence with exercise equipment Exercise tolerated well No report of concerns or symptoms today Strength training completed today  Goals Unmet:  Not Applicable  Comments: Check out 1200.   Dr. Kathie Dike is Medical Director for [facility] Pul[facility] Re[facility].

## 2021-04-15 ENCOUNTER — Encounter (HOSPITAL_COMMUNITY)
Admission: RE | Admit: 2021-04-15 | Discharge: 2021-04-15 | Disposition: A | Discharge status: Home / Self Care | Payer: Medicare Other | Source: Ambulatory Visit | Attending: Cardiology | Admitting: Cardiology

## 2021-04-15 VITALS — Wt 155.9 lb

## 2021-04-15 DIAGNOSIS — Z955 Presence of coronary angioplasty implant and graft: Secondary | ICD-10-CM

## 2021-04-15 NOTE — Progress Notes (Signed)
Daily Session Note  Patient Details  Name: William [name]. MRN: [id number] Date of Birth: 02-25-1941 Referring Provider:   Flowsheet Row CARDIAC REHAB PHASE II ORIENTATION from 03/07/2021 in [place]  Referring Provider Dr. Domenic Polite       Encounter Date: 04/15/2021  Check In:  Session Check In - 04/15/21 1100       Check-In   Supervising physician immediately available to respond to emergencies [facility] MD immediately available    Physician(s) Branch    Location [facility]    Staff Present Hoy Register, MS, ACSM-CEP, Exercise Physiologist;Heather Otho Ket, BS, Exercise Physiologist;Evita Merida Wynetta Emery, RN, BSN    Virtual Visit No    Medication changes reported     No    Fall or balance concerns reported    Yes    Comments Patient reports losing his balance at times and feeling dizzy if he gets up too fast.    Tobacco Cessation No Change    Warm-up and Cool-down Performed as group-led instruction    Resistance Training Performed Yes    VAD Patient? No    PAD/SET Patient? No      Pain Assessment   Currently in Pain? No/denies    Pain Score 0-No pain    Multiple Pain Sites No             Capillary Blood Glucose: No results found for this or any previous visit (from the past 24 hour(s)).    Social History   Tobacco Use  Smoking Status Former   Packs/day: 2.00   Years: 30.00   Pack years: 60.00   Types: Cigarettes   Start date: 06/03/1961   Quit date: 11/17/1991   Years since quitting: 29.4  Smokeless Tobacco Former   Types: Snuff, Chew    Goals Met:  Independence with exercise equipment Exercise tolerated well No report of concerns or symptoms today Strength training completed today  Goals Unmet:  Not Applicable  Comments: Check out 1200.   Dr. Kathie Dike is Medical Director for [facility].

## 2021-04-17 ENCOUNTER — Encounter (HOSPITAL_COMMUNITY)
Admission: RE | Admit: 2021-04-17 | Discharge: 2021-04-17 | Disposition: A | Discharge status: Home / Self Care | Payer: Medicare Other | Source: Ambulatory Visit | Attending: Cardiology | Admitting: Cardiology

## 2021-04-17 DIAGNOSIS — Z955 Presence of coronary angioplasty implant and graft: Secondary | ICD-10-CM

## 2021-04-17 NOTE — Progress Notes (Signed)
Daily Session Note  Patient Details  Name: William [name]. MRN: [id number] Date of Birth: 1940/10/18 Referring Provider:   Flowsheet Row CARDIAC REHAB PHASE II ORIENTATION from 03/07/2021 in [place]  Referring Provider Dr. Domenic Polite       Encounter Date: 04/17/2021  Check In:  Session Check In - 04/17/21 1100       Check-In   Supervising physician immediately available to respond to emergencies [facility] immediately available    Physician(s) Dr. Gardiner Rhyme    Location [facility]    Staff Present Redge Gainer, BS, Exercise Physiologist;Debra Wynetta Emery, RN, BSN;Other    Virtual Visit No    Fall or balance concerns reported    Yes    Comments Patient reports losing his balance at times and feeling dizzy if he gets up too fast.    Tobacco Cessation No Change    Warm-up and Cool-down Performed as group-led instruction    Resistance Training Performed Yes    VAD Patient? No    PAD/SET Patient? No      Pain Assessment   Currently in Pain? No/denies    Pain Score 0-No pain    Multiple Pain Sites No             Capillary Blood Glucose: No results found for this or any previous visit (from the past 24 hour(s)).    Social History   Tobacco Use  Smoking Status Former   Packs/day: 2.00   Years: 30.00   Pack years: 60.00   Types: Cigarettes   Start date: 06/03/1961   Quit date: 11/17/1991   Years since quitting: 29.4  Smokeless Tobacco Former   Types: Snuff, Chew    Goals Met:  Independence with exercise equipment Exercise tolerated well No report of concerns or symptoms today Strength training completed today  Goals Unmet:  Not Applicable  Comments: check out 1200   Dr. Kathie Dike is Medical Director for [facility].

## 2021-04-19 ENCOUNTER — Encounter (HOSPITAL_COMMUNITY)
Admission: RE | Admit: 2021-04-19 | Discharge: 2021-04-19 | Disposition: A | Discharge status: Home / Self Care | Payer: Medicare Other | Source: Ambulatory Visit | Attending: Cardiology | Admitting: Cardiology

## 2021-04-19 DIAGNOSIS — Z955 Presence of coronary angioplasty implant and graft: Secondary | ICD-10-CM | POA: Diagnosis not present

## 2021-04-19 NOTE — Progress Notes (Signed)
Daily Session Note  Patient Details  Name: William [name]. MRN: [id number] Date of Birth: 07-16-1940 Referring Provider:   Flowsheet Row CARDIAC REHAB PHASE II ORIENTATION from 03/07/2021 in [place]  Referring Provider Dr. Domenic Polite       Encounter Date: 04/19/2021  Check In:  Session Check In - 04/19/21 1100       Check-In   Supervising physician immediately available to respond to emergencies [facility] immediately available    Physician(s) Dr. Audie Box    Location [facility]    Staff Present Geanie Cooley, RN;Dalton Kris Mouton, MS, ACSM-CEP, Exercise Physiologist;Heather Otho Ket, BS, Exercise Physiologist;Debra Wynetta Emery, RN, BSN    Virtual Visit No    Medication changes reported     No    Fall or balance concerns reported    Yes    Comments Patient reports losing his balance at times and feeling dizzy if he gets up too fast.    Tobacco Cessation No Change    Warm-up and Cool-down Performed as group-led instruction    Resistance Training Performed Yes    VAD Patient? No    PAD/SET Patient? No      Pain Assessment   Currently in Pain? No/denies    Pain Score 0-No pain    Multiple Pain Sites No             Capillary Blood Glucose: No results found for this or any previous visit (from the past 24 hour(s)).    Social History   Tobacco Use  Smoking Status Former   Packs/day: 2.00   Years: 30.00   Pack years: 60.00   Types: Cigarettes   Start date: 06/03/1961   Quit date: 11/17/1991   Years since quitting: 29.4  Smokeless Tobacco Former   Types: Snuff, Chew    Goals Met:  Independence with exercise equipment Exercise tolerated well No report of concerns or symptoms today Strength training completed today  Goals Unmet:  Not Applicable  Comments: check out @ 12:00   Dr. Kathie Dike is Medical Director for [facility].

## 2021-04-22 ENCOUNTER — Other Ambulatory Visit: Payer: Self-pay

## 2021-04-22 ENCOUNTER — Encounter (HOSPITAL_COMMUNITY)
Admission: RE | Admit: 2021-04-22 | Discharge: 2021-04-22 | Disposition: A | Discharge status: Home / Self Care | Payer: Medicare Other | Source: Ambulatory Visit | Attending: Cardiology | Admitting: Cardiology

## 2021-04-22 DIAGNOSIS — Z955 Presence of coronary angioplasty implant and graft: Secondary | ICD-10-CM

## 2021-04-22 NOTE — Progress Notes (Signed)
Daily Session Note  Patient Details  Name: William [name]. MRN: [id number] Date of Birth: [birth year] Referring Provider:   Flowsheet Row CARDIAC REHAB PHASE II ORIENTATION from 03/07/2021 in [place]  Referring Provider Dr. Domenic Polite       Encounter Date: 04/22/2021  Check In:  Session Check In - 04/22/21 1100       Check-In   Supervising physician immediately available to respond to emergencies [facility] immediately available    Physician(s) Dr. Radford Pax    Location [facility]    Staff Present Hoy Register, MS, ACSM-CEP, Exercise Physiologist;Other    Virtual Visit No    Medication changes reported     No    Fall or balance concerns reported    Yes    Comments Patient reports losing his balance at times and feeling dizzy if he gets up too fast.    Tobacco Cessation No Change    Warm-up and Cool-down Performed as group-led instruction    Resistance Training Performed Yes    VAD Patient? No    PAD/SET Patient? No      Pain Assessment   Currently in Pain? No/denies    Pain Score 0-No pain    Multiple Pain Sites No             Capillary Blood Glucose: No results found for this or any previous visit (from the past 24 hour(s)).    Social History   Tobacco Use  Smoking Status Former   Packs/day: 2.00   Years: 30.00   Pack years: 60.00   Types: Cigarettes   Start date: 06/03/1961   Quit date: 11/17/1991   Years since quitting: 29.4  Smokeless Tobacco Former   Types: Snuff, Chew    Goals Met:  Independence with exercise equipment Exercise tolerated well No report of concerns or symptoms today Strength training completed today  Goals Unmet:  Not Applicable  Comments: checkout time is 1200   Dr. Kathie Dike is Medical Director for [facility].

## 2021-04-24 ENCOUNTER — Encounter (HOSPITAL_COMMUNITY)
Admission: RE | Admit: 2021-04-24 | Discharge: 2021-04-24 | Disposition: A | Discharge status: Home / Self Care | Payer: Medicare Other | Source: Ambulatory Visit | Attending: Cardiology | Admitting: Cardiology

## 2021-04-24 DIAGNOSIS — Z955 Presence of coronary angioplasty implant and graft: Secondary | ICD-10-CM | POA: Diagnosis not present

## 2021-04-24 NOTE — Progress Notes (Signed)
Daily Session Note  Patient Details  Name: William [name]. MRN: [id number] Date of Birth: 10/17/1940 Referring Provider:   Flowsheet Row CARDIAC REHAB PHASE II ORIENTATION from 03/07/2021 in [place]  Referring Provider Dr. Domenic Polite       Encounter Date: 04/24/2021  Check In:  Session Check In - 04/24/21 1100       Check-In   Supervising physician immediately available to respond to emergencies [facility] immediately available    Physician(s) Dr. Domenic Polite    Location [facility]    Staff Present Geanie Cooley, RN;Dalton Kris Mouton, MS, ACSM-CEP, Exercise Physiologist;Heather Otho Ket, BS, Exercise Physiologist;Debra Wynetta Emery, RN, BSN    Virtual Visit No    Medication changes reported     No    Fall or balance concerns reported    Yes    Comments Patient reports losing his balance at times and feeling dizzy if he gets up too fast.    Tobacco Cessation No Change    Warm-up and Cool-down Performed as group-led instruction    Resistance Training Performed Yes    VAD Patient? No    PAD/SET Patient? No      Pain Assessment   Currently in Pain? No/denies    Pain Score 0-No pain    Multiple Pain Sites No             Capillary Blood Glucose: No results found for this or any previous visit (from the past 24 hour(s)).    Social History   Tobacco Use  Smoking Status Former   Packs/day: 2.00   Years: 30.00   Pack years: 60.00   Types: Cigarettes   Start date: 06/03/1961   Quit date: 11/17/1991   Years since quitting: 29.4  Smokeless Tobacco Former   Types: Snuff, Chew    Goals Met:  Independence with exercise equipment Exercise tolerated well No report of concerns or symptoms today Strength training completed today  Goals Unmet:  Not Applicable  Comments: check out @ 12:00pm   Dr. Kathie Dike is Medical Director for [facility].

## 2021-04-26 ENCOUNTER — Encounter (HOSPITAL_COMMUNITY)
Admission: RE | Admit: 2021-04-26 | Discharge: 2021-04-26 | Disposition: A | Discharge status: Home / Self Care | Payer: Medicare Other | Source: Ambulatory Visit | Attending: Cardiology | Admitting: Cardiology

## 2021-04-26 DIAGNOSIS — Z955 Presence of coronary angioplasty implant and graft: Secondary | ICD-10-CM | POA: Diagnosis not present

## 2021-04-26 NOTE — Progress Notes (Signed)
Daily Session Note  Patient Details  Name: William [name]. MRN: [id number] Date of Birth: [birth year] Referring Provider:   Flowsheet Row CARDIAC REHAB PHASE II ORIENTATION from 03/07/2021 in [place]  Referring Provider Dr. Domenic Polite       Encounter Date: 04/26/2021  Check In:  Session Check In - 04/26/21 1100       Check-In   Supervising physician immediately available to respond to emergencies [facility] immediately available    Physician(s) Dr. Domenic Polite    Location [facility]    Staff Present Hoy Register, MS, ACSM-CEP, Exercise Physiologist;Heather Otho Ket, BS, Exercise Physiologist;Motty Borin Wynetta Emery, RN, BSN;Other    Virtual Visit No    Medication changes reported     No    Fall or balance concerns reported    Yes    Comments Patient reports losing his balance at times and feeling dizzy if he gets up too fast.    Tobacco Cessation No Change    Warm-up and Cool-down Performed as group-led instruction    Resistance Training Performed Yes    VAD Patient? No    PAD/SET Patient? No      Pain Assessment   Currently in Pain? No/denies    Pain Score 0-No pain    Multiple Pain Sites No             Capillary Blood Glucose: No results found for this or any previous visit (from the past 24 hour(s)).    Social History   Tobacco Use  Smoking Status Former   Packs/day: 2.00   Years: 30.00   Pack years: 60.00   Types: Cigarettes   Start date: 06/03/1961   Quit date: 11/17/1991   Years since quitting: 29.4  Smokeless Tobacco Former   Types: Snuff, Chew    Goals Met:  Independence with exercise equipment Exercise tolerated well No report of concerns or symptoms today Strength training completed today  Goals Unmet:  Not Applicable  Comments: Check out 1200.   Dr. Kathie Dike is Medical Director for [facility].

## 2021-04-29 ENCOUNTER — Encounter (HOSPITAL_COMMUNITY)
Admission: RE | Admit: 2021-04-29 | Discharge: 2021-04-29 | Disposition: A | Discharge status: Home / Self Care | Payer: Medicare Other | Source: Ambulatory Visit | Attending: Cardiology | Admitting: Cardiology

## 2021-04-29 VITALS — Wt 155.4 lb

## 2021-04-29 DIAGNOSIS — Z955 Presence of coronary angioplasty implant and graft: Secondary | ICD-10-CM | POA: Diagnosis not present

## 2021-04-29 NOTE — Progress Notes (Signed)
Daily Session Note  Patient Details  Name: William [name]. MRN: [id number] Date of Birth: 06/15/1940 Referring Provider:   Flowsheet Row CARDIAC REHAB PHASE II ORIENTATION from 03/07/2021 in [place]  Referring Provider Dr. Domenic Polite       Encounter Date: 04/29/2021  Check In:  Session Check In - 04/29/21 1100       Check-In   Supervising physician immediately available to respond to emergencies [facility] MD immediately available    Physician(s) Dr.Pemberton    Location [facility]    Staff Present Geanie Cooley, RN;Dalton Kris Mouton, MS, ACSM-CEP, Exercise Physiologist;Debra Wynetta Emery, RN, BSN    Virtual Visit No    Medication changes reported     No    Fall or balance concerns reported    Yes    Comments Patient reports losing his balance at times and feeling dizzy if he gets up too fast.    Tobacco Cessation No Change    Warm-up and Cool-down Performed as group-led instruction    Resistance Training Performed Yes    VAD Patient? No    PAD/SET Patient? No      Pain Assessment   Currently in Pain? No/denies    Pain Score 0-No pain    Multiple Pain Sites No             Capillary Blood Glucose: No results found for this or any previous visit (from the past 24 hour(s)).    Social History   Tobacco Use  Smoking Status Former   Packs/day: 2.00   Years: 30.00   Pack years: 60.00   Types: Cigarettes   Start date: 06/03/1961   Quit date: 11/17/1991   Years since quitting: 29.4  Smokeless Tobacco Former   Types: Snuff, Chew    Goals Met:  Independence with exercise equipment Exercise tolerated well No report of concerns or symptoms today Strength training completed today  Goals Unmet:  Not Applicable  Comments: check out @ 12:00pm   Dr. Kathie Dike is Medical Director for [facility].

## 2021-05-01 ENCOUNTER — Encounter (HOSPITAL_COMMUNITY)
Admission: RE | Admit: 2021-05-01 | Discharge: 2021-05-01 | Disposition: A | Discharge status: Home / Self Care | Payer: Medicare Other | Source: Ambulatory Visit | Attending: Cardiology | Admitting: Cardiology

## 2021-05-01 DIAGNOSIS — Z955 Presence of coronary angioplasty implant and graft: Secondary | ICD-10-CM | POA: Insufficient documentation

## 2021-05-01 NOTE — Progress Notes (Signed)
Daily Session Note ° °Patient Details  °Name: William D Huezo Jr. °MRN: [id number] °Date of Birth: 07/04/1940 °Referring Provider:   °Flowsheet Row CARDIAC REHAB PHASE II ORIENTATION from 03/07/2021 in [place] [facility]  °Referring Provider Dr. McDowell  ° °  ° ° °Encounter Date: 05/01/2021 ° °Check In: ° Session Check In - 05/01/21 1100   ° °  ° Check-In  ° Supervising physician immediately available to respond to emergencies [facility] MD immediately available   ° Physician(s) Dr. Branch   ° Location [facility]   ° Staff Present Debra Johnson, RN, BSN;Heather Jachimiak, BS, Exercise Physiologist;Dalton Fletcher, MS, ACSM-CEP, Exercise Physiologist;Other   ° Virtual Visit No   ° Medication changes reported     No   ° Fall or balance concerns reported    Yes   ° Comments Patient reports losing his balance at times and feeling dizzy if he gets up too fast.   ° Tobacco Cessation No Change   ° Warm-up and Cool-down Performed as group-led instruction   ° Resistance Training Performed Yes   ° VAD Patient? No   ° PAD/SET Patient? No   °  ° Pain Assessment  ° Currently in Pain? No/denies   ° Pain Score 0-No pain   ° Multiple Pain Sites No   ° °  °  ° °  ° ° °Capillary Blood Glucose: °No results found for this or any previous visit (from the past 24 hour(s)). ° ° ° °Social History  ° °Tobacco Use  °Smoking Status Former  ° Packs/day: 2.00  ° Years: 30.00  ° Pack years: 60.00  ° Types: Cigarettes  ° Start date: 06/03/1961  ° Quit date: 11/17/1991  ° Years since quitting: 29.4  °Smokeless Tobacco Former  ° Types: Snuff, Chew  ° ° °Goals Met:  °Independence with exercise equipment °Exercise tolerated well °No report of concerns or symptoms today °Strength training completed today ° °Goals Unmet:  °Not Applicable ° °Comments: checkout time is 1200 ° ° °Dr. Jehanzeb Memon is Medical Director for [facility]. °

## 2021-05-01 NOTE — Progress Notes (Signed)
Cardiac Individual Treatment Plan  Patient Details  Name: William [name]. MRN: [id number] Date of Birth: 05/27/1940 Referring Provider:   Flowsheet Row CARDIAC REHAB PHASE II ORIENTATION from 03/07/2021 in [facility] [facility]  Referring Provider Dr. Domenic Polite       Initial Encounter Date:  Flowsheet Row CARDIAC REHAB PHASE II ORIENTATION from 03/07/2021 in [place]  Date 03/07/21       Visit Diagnosis: S/P coronary artery stent placement  Patient's Home Medications on Admission:  Current Outpatient Medications:    amLODipine (NORVASC) 10 MG tablet, Take 1 tablet (10 mg total) by mouth daily., Disp: 90 tablet, Rfl: 3   Ascorbic Acid (VITAMIN C) 1000 MG tablet, Take 1,000 mg by mouth in the morning., Disp: , Rfl:    aspirin EC 81 MG tablet, Take 1 tablet (81 mg total) by mouth daily., Disp: , Rfl:    cholecalciferol (VITAMIN D) 25 MCG (1000 UNIT) tablet, Take 1,000 Units by mouth in the morning., Disp: , Rfl:    ezetimibe (ZETIA) 10 MG tablet, Take 1 tablet (10 mg total) by mouth daily., Disp: 30 tablet, Rfl: 11   famotidine (PEPCID) 20 MG tablet, Take 20 mg by mouth every evening., Disp: , Rfl:    fish oil-omega-3 fatty acids 1000 MG capsule, Take 1,000 mg by mouth in the morning., Disp: , Rfl:    folic acid (FOLVITE) 332 MCG tablet, Take 400 mcg by mouth in the morning., Disp: , Rfl:    Garlic 9518 MG TBEC, Take 2,000 mg by mouth in the morning., Disp: , Rfl:    isosorbide mononitrate (IMDUR) 60 MG 24 hr tablet, Take 1 tablet (60 mg total) by mouth daily., Disp: 90 tablet, Rfl: 3   metoprolol tartrate (LOPRESSOR) 50 MG tablet, Take 1 tablet (50 mg total) by mouth 2 (two) times daily., Disp: 180 tablet, Rfl: 3   Multiple Vitamin (MULTIVITAMIN WITH MINERALS) TABS tablet, Take 1 tablet by mouth in the morning., Disp: , Rfl:    nitroGLYCERIN (NITROSTAT) 0.4 MG SL tablet, Place 1 tablet (0.4 mg total) under the tongue every 5 (five) minutes as  needed for chest pain. If pain does not resolve with rest., Disp: 50 tablet, Rfl: 3   olmesartan (BENICAR) 40 MG tablet, Take 1 tablet (40 mg total) by mouth daily., Disp: 30 tablet, Rfl: 11   omega-3 acid ethyl esters (LOVAZA) 1 g capsule, Take 1 capsule (1 g total) by mouth daily., Disp: 60 capsule, Rfl: 2   omeprazole (PRILOSEC) 20 MG capsule, Take 20 mg by mouth every morning., Disp: , Rfl:    rosuvastatin (CRESTOR) 10 MG tablet, TAKE 1 TABLET BY MOUTH EVERY DAY N, Disp: 90 tablet, Rfl: 3   thiamine (VITAMIN B-1) 100 MG tablet, Take 100 mg by mouth every evening. , Disp: , Rfl:    thyroid (NP THYROID) 60 MG tablet, Take 1 tablet (60 mg total) by mouth daily before breakfast., Disp: 90 tablet, Rfl: 0   ticagrelor (BRILINTA) 90 MG TABS tablet, Take 1 tablet (90 mg total) by mouth 2 (two) times daily. 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[facility])    Myocardial infarction ([facility])    Peripheral arterial disease ([facility])    Suboptimal revascularization options    Tobacco Use: Social History   Tobacco Use  Smoking Status Former   Packs/day: 2.00   Years: 30.00   Pack years: 60.00   Types: Cigarettes   Start date: 06/03/1961   Quit date: 11/17/1991   Years since quitting: 29.4  Smokeless Tobacco Former   Types: Snuff, Chew    Labs: Recent Review Flowsheet Data     Labs for ITP Cardiac and Pulmonary Rehab Latest Ref Rng & Units 05/23/2015 08/21/2017 07/18/2019 07/04/2020 12/22/2020   Cholestrol 0 - 200 mg/dL - - 127 139 96   LDLCALC 0 - 99 mg/dL - - 71 81 39   HDL >40 mg/dL - - 41 36(L) 31(L)   Trlycerides <150 mg/dL - - 74 128 130   Hemoglobin A1c  4.8 - 5.6 % - - - - -   PHART 7.350 - 7.450 - - - - -   PCO2ART 35.0 - 45.0 mmHg - - - - -   HCO3 20.0 - 24.0 mEq/L - - - - -   TCO2 22 - 32 mmol/L 23 24 - - -   ACIDBASEDEF 0.0 - 2.0 mmol/L - - - - -   O2SAT % - - - - -       Capillary Blood Glucose: Lab Results  Component Value Date   GLUCAP 81 05/29/2015   GLUCAP 90 05/29/2015   GLUCAP 100 (H) 05/28/2015   GLUCAP 92 05/28/2015   GLUCAP 94 05/28/2015     Exercise Target Goals: Exercise Program Goal: Individual exercise prescription set using results from initial 6 min walk test and THRR while considering  patients activity barriers and safety.   Exercise Prescription Goal: Starting with aerobic activity 30 plus minutes a day, 3 days per week for initial exercise prescription. Provide home exercise prescription and guidelines that participant acknowledges understanding prior to discharge.  Activity Barriers & Risk Stratification:  Activity Barriers & Cardiac Risk Stratification - 03/07/21 1248       Activity Barriers & Cardiac Risk Stratification   Activity Barriers Arthritis;Deconditioning;Shortness of Breath    Cardiac Risk Stratification High             6 Minute Walk:  6 Minute Walk     Row Name 03/07/21 1327         6 Minute Walk   Phase Initial     Distance 600 feet     Walk Time 6 minutes     # of Rest Breaks 3     MPH 1.14     METS 1.13     RPE 15     VO2 Peak 3.96     Symptoms Yes (comment)     Comments Claudication 9/10 caused 3 standing breaks for 150 sec, 30 sec, and 30 sec     Resting HR 57 bpm     Resting BP 130/60     Resting Oxygen Saturation  97 %     Exercise Oxygen Saturation  during 6 min walk 96 %     Max Ex. HR 79 bpm     Max Ex. BP 138/60     2 Minute Post BP 120/64              Oxygen Initial Assessment:   Oxygen Re-Evaluation:   Oxygen Discharge (Final Oxygen Re-Evaluation):  Initial Exercise Prescription:  Initial Exercise Prescription - 03/07/21 1300        Date of Initial Exercise RX and Referring Provider   Date 03/07/21    Referring Provider Dr. Domenic Polite    Expected Discharge Date 05/31/21      NuStep   Level 1    SPM 60    Minutes 39      Prescription Details   Frequency (times per week) 3    Duration Progress to 30 minutes of continuous aerobic without signs/symptoms of physical distress      Intensity   THRR 40-80% of Max Heartrate 56-113    Ratings of Perceived Exertion 11-13    Perceived Dyspnea 0-4      Resistance Training   Training Prescription Yes    Weight 3 lbs    Reps 10-15             Perform Capillary Blood Glucose checks as needed.  Exercise Prescription Changes:   Exercise Prescription Changes     Row Name 03/18/21 1100 03/29/21 1100 04/15/21 1300 04/30/21 1200       Response to Exercise   Blood Pressure (Admit) 150/50 138/50 120/50 130/50    Blood Pressure (Exercise) 148/50 130/50 125/48 144/50    Blood Pressure (Exit) 128/52 104/50 98/48 108/48    Heart Rate (Admit) 59 bpm 59 bpm 62 bpm 61 bpm    Heart Rate (Exercise) 82 bpm 78 bpm 86 bpm 85 bpm    Heart Rate (Exit) 68 bpm 66 bpm 70 bpm 70 bpm    Rating of Perceived Exertion (Exercise) 13 13 13 13     Duration Continue with 30 min of aerobic exercise without signs/symptoms of physical distress. Continue with 30 min of aerobic exercise without signs/symptoms of physical distress. Continue with 30 min of aerobic exercise without signs/symptoms of physical distress. Continue with 30 min of aerobic exercise without signs/symptoms of physical distress.    Intensity THRR unchanged THRR unchanged THRR unchanged THRR unchanged      Progression   Progression Continue to progress workloads to maintain intensity without signs/symptoms of physical distress. Continue to progress workloads to maintain intensity without signs/symptoms of physical distress. Continue to progress workloads to maintain intensity without signs/symptoms of physical distress.  Continue to progress workloads to maintain intensity without signs/symptoms of physical distress.      Resistance Training   Training Prescription Yes Yes Yes Yes    Weight 3 3 3 3     Reps 10-15 10-15 10-15 10-15    Time 10 Minutes 10 Minutes 10 Minutes 10 Minutes      NuStep   Level 1 1 1 1     SPM 79 81 70 77    Minutes 22 22 22 22     METs 1.9 1.78 1.95 1.86      Arm Ergometer   Level 1 1 1 1     Minutes 17 22 17 17     METs 1.89 2.04 1.96 1.96             Exercise Comments:   Exercise Goals and Review:   Exercise Goals     Row Name 03/07/21 1330 04/02/21 1347 04/30/21 1254         Exercise Goals   Increase Physical Activity Yes Yes Yes     Intervention Provide advice, education, support and counseling about physical activity/exercise needs.;Develop an individualized exercise prescription for aerobic and resistive training based on initial evaluation findings, risk stratification, comorbidities and participant's personal goals.  Provide advice, education, support and counseling about physical activity/exercise needs.;Develop an individualized exercise prescription for aerobic and resistive training based on initial evaluation findings, risk stratification, comorbidities and participant's personal goals. Provide advice, education, support and counseling about physical activity/exercise needs.;Develop an individualized exercise prescription for aerobic and resistive training based on initial evaluation findings, risk stratification, comorbidities and participant's personal goals.     Expected Outcomes Short Term: Attend rehab on a regular basis to increase amount of physical activity.;Long Term: Exercising regularly at least 3-5 days a week.;Long Term: Add in home exercise to make exercise part of routine and to increase amount of physical activity. Short Term: Attend rehab on a regular basis to increase amount of physical activity.;Long Term: Exercising regularly at least 3-5 days  a week.;Long Term: Add in home exercise to make exercise part of routine and to increase amount of physical activity. Short Term: Attend rehab on a regular basis to increase amount of physical activity.;Long Term: Exercising regularly at least 3-5 days a week.;Long Term: Add in home exercise to make exercise part of routine and to increase amount of physical activity.     Increase Strength and Stamina Yes Yes Yes     Intervention Provide advice, education, support and counseling about physical activity/exercise needs.;Develop an individualized exercise prescription for aerobic and resistive training based on initial evaluation findings, risk stratification, comorbidities and participant's personal goals. Provide advice, education, support and counseling about physical activity/exercise needs.;Develop an individualized exercise prescription for aerobic and resistive training based on initial evaluation findings, risk stratification, comorbidities and participant's personal goals. Provide advice, education, support and counseling about physical activity/exercise needs.;Develop an individualized exercise prescription for aerobic and resistive training based on initial evaluation findings, risk stratification, comorbidities and participant's personal goals.     Expected Outcomes Short Term: Increase workloads from initial exercise prescription for resistance, speed, and METs.;Short Term: Perform resistance training exercises routinely during rehab and add in resistance training at home;Long Term: Improve cardiorespiratory fitness, muscular endurance and strength as measured by increased METs and functional capacity (6MWT) Short Term: Increase workloads from initial exercise prescription for resistance, speed, and METs.;Short Term: Perform resistance training exercises routinely during rehab and add in resistance training at home;Long Term: Improve cardiorespiratory fitness, muscular endurance and strength as measured  by increased METs and functional capacity (6MWT) Short Term: Increase workloads from initial exercise prescription for resistance, speed, and METs.;Short Term: Perform resistance training exercises routinely during rehab and add in resistance training at home;Long Term: Improve cardiorespiratory fitness, muscular endurance and strength as measured by increased METs and functional capacity (6MWT)     Able to understand and use rate of perceived exertion (RPE) scale Yes Yes Yes     Intervention Provide education and explanation on how to use RPE scale Provide education and explanation on how to use RPE scale Provide education and explanation on how to use RPE scale     Expected Outcomes Short Term: Able to use RPE daily in rehab to express subjective intensity level;Long Term:  Able to use RPE to guide intensity level when exercising independently Short Term: Able to use RPE daily in rehab to express subjective intensity level;Long Term:  Able to use RPE to guide intensity level when exercising independently Short Term: Able to use RPE daily in rehab to express subjective intensity level;Long Term:  Able to use RPE to guide intensity level when exercising independently     Knowledge and understanding of Target Heart Rate Range (THRR) Yes Yes Yes  Intervention Provide education and explanation of THRR including how the numbers were predicted and where they are located for reference Provide education and explanation of THRR including how the numbers were predicted and where they are located for reference Provide education and explanation of THRR including how the numbers were predicted and where they are located for reference     Expected Outcomes Short Term: Able to state/look up THRR;Long Term: Able to use THRR to govern intensity when exercising independently;Short Term: Able to use daily as guideline for intensity in rehab Short Term: Able to state/look up THRR;Long Term: Able to use THRR to govern  intensity when exercising independently;Short Term: Able to use daily as guideline for intensity in rehab Short Term: Able to state/look up THRR;Long Term: Able to use THRR to govern intensity when exercising independently;Short Term: Able to use daily as guideline for intensity in rehab     Able to check pulse independently Yes Yes Yes     Intervention Provide education and demonstration on how to check pulse in carotid and radial arteries.;Review the importance of being able to check your own pulse for safety during independent exercise Provide education and demonstration on how to check pulse in carotid and radial arteries.;Review the importance of being able to check your own pulse for safety during independent exercise Provide education and demonstration on how to check pulse in carotid and radial arteries.;Review the importance of being able to check your own pulse for safety during independent exercise     Expected Outcomes Short Term: Able to explain why pulse checking is important during independent exercise;Long Term: Able to check pulse independently and accurately Short Term: Able to explain why pulse checking is important during independent exercise;Long Term: Able to check pulse independently and accurately Short Term: Able to explain why pulse checking is important during independent exercise;Long Term: Able to check pulse independently and accurately     Understanding of Exercise Prescription Yes Yes Yes     Intervention Provide education, explanation, and written materials on patient's individual exercise prescription Provide education, explanation, and written materials on patient's individual exercise prescription Provide education, explanation, and written materials on patient's individual exercise prescription     Expected Outcomes Short Term: Able to explain program exercise prescription;Long Term: Able to explain home exercise prescription to exercise independently Short Term: Able to  explain program exercise prescription;Long Term: Able to explain home exercise prescription to exercise independently Short Term: Able to explain program exercise prescription;Long Term: Able to explain home exercise prescription to exercise independently              Exercise Goals Re-Evaluation :  Exercise Goals Re-Evaluation     Row Name 04/02/21 1347 04/02/21 1348 04/30/21 1255         Exercise Goal Re-Evaluation   Exercise Goals Review Increase Physical Activity;Increase Strength and Stamina;Able to understand and use rate of perceived exertion (RPE) scale;Knowledge and understanding of Target Heart Rate Range (THRR);Able to check pulse independently;Understanding of Exercise Prescription Increase Physical Activity;Increase Strength and Stamina;Able to understand and use rate of perceived exertion (RPE) scale;Knowledge and understanding of Target Heart Rate Range (THRR);Able to check pulse independently;Understanding of Exercise Prescription Increase Physical Activity;Increase Strength and Stamina;Able to understand and use rate of perceived exertion (RPE) scale;Knowledge and understanding of Target Heart Rate Range (THRR);Able to check pulse independently;Understanding of Exercise Prescription     Comments -- Pt has completed 9 sessions of cardiac rehab. His is severely deconditioned and is also limited by  COPD and othropedic issues. He gives good effort while here. He does have to take several breaks when exercising, but his breaks have decreased since starting the program. He is currently exercising at 2.04 METs on the stepper. Will continue to monitor and progress as able. Pt has completed 21 sessions of cardiac rehab. His COPD still limits him and he has significant DOE. He gives good effort but he has to take frequent breaks while exercising. He is currently exercising at 1.96 METs. Will continue to monitor and progress as able.     Expected Outcomes Through exercise at rehab and at  home, the patient will meet their stated goals. Through exercise at rehab and at home, the patient will meet their stated goals. Through exercise at rehab and at home, the patient will meet their stated goals.               Discharge Exercise Prescription (Final Exercise Prescription Changes):  Exercise Prescription Changes - 04/30/21 1200       Response to Exercise   Blood Pressure (Admit) 130/50    Blood Pressure (Exercise) 144/50    Blood Pressure (Exit) 108/48    Heart Rate (Admit) 61 bpm    Heart Rate (Exercise) 85 bpm    Heart Rate (Exit) 70 bpm    Rating of Perceived Exertion (Exercise) 13    Duration Continue with 30 min of aerobic exercise without signs/symptoms of physical distress.    Intensity THRR unchanged      Progression   Progression Continue to progress workloads to maintain intensity without signs/symptoms of physical distress.      Resistance Training   Training Prescription Yes    Weight 3    Reps 10-15    Time 10 Minutes      NuStep   Level 1    SPM 77    Minutes 22    METs 1.86      Arm Ergometer   Level 1    Minutes 17    METs 1.96             Nutrition:  Target Goals: Understanding of nutrition guidelines, daily intake of sodium 1500mg , cholesterol 200mg , calories 30% from fat and 7% or less from saturated fats, daily to have 5 or more servings of fruits and vegetables.  Biometrics:  Pre Biometrics - 03/07/21 1331       Pre Biometrics   Height 5\' 6"  (1.676 m)    Weight 71.1 kg    Waist Circumference 40 inches    Hip Circumference 37.5 inches    Waist to Hip Ratio 1.07 %    BMI (Calculated) 25.31    Triceps Skinfold 28 mm    % Body Fat 29.9 %    Flexibility 0 in    Single Leg Stand 3 seconds              Nutrition Therapy Plan and Nutrition Goals:  Nutrition Therapy & Goals - 03/07/21 1311       Personal Nutrition Goals   Comments Patient scored 26 on his diet assessment. Handout provided and explained  regarding healthier choices. We offer 2 educational sessions on heart healthy nutrition with handouts and assistance with RD referral if patient is interested.      Intervention Plan   Intervention Nutrition handout(s) given to patient.    Expected Outcomes Short Term Goal: Understand basic principles of dietary content, such as calories, fat, sodium, cholesterol and nutrients.  Nutrition Assessments:  Nutrition Assessments - 03/07/21 1311       MEDFICTS Scores   Pre Score 26            MEDIFICTS Score Key: ?70 Need to make dietary changes  40-70 Heart Healthy Diet ? 40 Therapeutic Level Cholesterol Diet   Picture Your Plate Scores: <66 Unhealthy dietary pattern with much room for improvement. 41-50 Dietary pattern unlikely to meet recommendations for good health and room for improvement. 51-60 More healthful dietary pattern, with some room for improvement.  >60 Healthy dietary pattern, although there may be some specific behaviors that could be improved.    Nutrition Goals Re-Evaluation:   Nutrition Goals Discharge (Final Nutrition Goals Re-Evaluation):   Psychosocial: Target Goals: Acknowledge presence or absence of significant depression and/or stress, maximize coping skills, provide positive support system. Participant is able to verbalize types and ability to use techniques and skills needed for reducing stress and depression.  Initial Review & Psychosocial Screening:  Initial Psych Review & Screening - 03/07/21 1358       Initial Review   Current issues with None Identified      Family Dynamics   Good Support System? Yes      Barriers   Psychosocial barriers to participate in program There are no identifiable barriers or psychosocial needs.      Screening Interventions   Interventions Encouraged to exercise    Expected Outcomes Short Term goal: Utilizing psychosocial counselor, staff and physician to assist with identification of specific  Stressors or current issues interfering with healing process. Setting desired goal for each stressor or current issue identified.             Quality of Life Scores:  Quality of Life - 03/07/21 1326       Quality of Life   Select Quality of Life      Quality of Life Scores   Health/Function Pre 19.14 %    Socioeconomic Pre 19.92 %    Psych/Spiritual Pre 22.5 %    Family Pre 24 %    GLOBAL Pre 20.68 %            Scores of 19 and below usually indicate a poorer quality of life in these areas.  A difference of  2-3 points is a clinically meaningful difference.  A difference of 2-3 points in the total score of the Quality of Life Index has been associated with significant improvement in overall quality of life, self-image, physical symptoms, and general health in studies assessing change in quality of life.  PHQ-9: Recent Review Flowsheet Data     Depression screen [facility] 2/9 03/07/2021 10/03/2020 07/04/2020 01/26/2020 07/18/2019   Decreased Interest 3 0 0 0 0   Down, Depressed, Hopeless 0 0 0 0 0   PHQ - 2 Score 3 0 0 0 0   Altered sleeping 0 0 0 - -   Tired, decreased energy 3 0 0 - -   Change in appetite 0 0 0 - -   Feeling bad or failure about yourself  0 0 0 - -   Trouble concentrating 0 0 0 - -   Moving slowly or fidgety/restless 0 0 0 - -   Suicidal thoughts 0 0 0 - -   PHQ-9 Score 6 0 0 - -   Difficult doing work/chores Somewhat difficult Not difficult at all Not difficult at all - -      Interpretation of Total Score  Total Score Depression Severity:  1-4 = Minimal depression, 5-9 = Mild depression, 10-14 = Moderate depression, 15-19 = Moderately severe depression, 20-27 = Severe depression   Psychosocial Evaluation and Intervention:  Psychosocial Evaluation - 03/07/21 1358       Psychosocial Evaluation & Interventions   Interventions Stress management education;Relaxation education;Encouraged to exercise with the program and follow exercise prescription     Comments Patient has no psychosocial barriers or issues identified at his orientation visit. He scored 6 on his PHQ-9 mainly due to his fatigue and not feeling like doing any activities mentally or physically. His overall QOL was 20.68% scoring lowest in health/function at 19.14%. He denies any depression or anxiety. He lives alone. His wife expired in 2014 due to cancer. He has a son and daughter that he says he if very close to and names as his support people. He has 4 grandchildren. He demonstrates an interest in improving his health and is looking forward to participaing in the program hoping he will gain some strength and energy and be able to do his ADL's.    Expected Outcomes Patient will continue to have no psychosocial barriers or issues identified.    Continue Psychosocial Services  No Follow up required             Psychosocial Re-Evaluation:  Psychosocial Re-Evaluation     [place] Name 03/20/21 1352 04/22/21 0857           Psychosocial Re-Evaluation   Current issues with None Identified --      Comments Patient continues to have no psychosocial barriers or issues identified. He is new to the program completing 5 sessions. He seems to enjoy coming to the sessions and demonstrates an interest in improving his health. We will continue to monitor. Patient continues to have no psychosocial barriers or issues identified. He is new to the program completing 17 sessions. He seems to enjoy coming to the sessions and demonstrates an interest in improving his health. We will continue to monitor.      Expected Outcomes Patient will continue to have no psychosocial barriers or issues idenfitied. Patient will continue to have no psychosocial barriers or issues idenfitied.      Interventions Encouraged to attend Cardiac Rehabilitation for the exercise;Relaxation education;Stress management education Encouraged to attend Cardiac Rehabilitation for the exercise;Relaxation education;Stress management  education      Continue Psychosocial Services  No Follow up required No Follow up required               Psychosocial Discharge (Final Psychosocial Re-Evaluation):  Psychosocial Re-Evaluation - 04/22/21 0857       Psychosocial Re-Evaluation   Comments Patient continues to have no psychosocial barriers or issues identified. He is new to the program completing 17 sessions. He seems to enjoy coming to the sessions and demonstrates an interest in improving his health. We will continue to monitor.    Expected Outcomes Patient will continue to have no psychosocial barriers or issues idenfitied.    Interventions Encouraged to attend Cardiac Rehabilitation for the exercise;Relaxation education;Stress management education    Continue Psychosocial Services  No Follow up required             Vocational Rehabilitation: Provide vocational rehab assistance to qualifying candidates.   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             Education: Education Goals: Education classes will be provided on a weekly basis, covering required topics. Participant will state understanding/return demonstration of topics presented.  Learning Barriers/Preferences:   Education Topics: Hypertension, Hypertension Reduction -Define heart disease and high blood pressure. Discus how high blood pressure affects the body and ways to reduce high blood pressure. Flowsheet Row CARDIAC REHAB PHASE II EXERCISE from 04/24/2021 in [place]  Date 04/10/21  Educator [place]  Instruction Review Code 1- Verbalizes Understanding       Exercise and Your Heart -Discuss why it is important to exercise, the FITT principles of exercise,  normal and abnormal responses to exercise, and how to exercise safely. Flowsheet Row CARDIAC REHAB PHASE II EXERCISE from 04/24/2021 in [place]  Date 04/17/21  Educator hj  Instruction Review Code 2- Demonstrated Understanding       Angina -Discuss definition of angina, causes of angina, treatment of angina, and how to decrease risk of having angina. Flowsheet Row CARDIAC REHAB PHASE II EXERCISE from 04/24/2021 in [place]  Date 04/24/21  Educator pb  Instruction Review Code 1- Verbalizes Understanding       Cardiac Medications -Review what the following cardiac medications are used for, how they affect the body, and side effects that may occur when taking the medications.  Medications include Aspirin, Beta blockers, calcium channel blockers, ACE Inhibitors, angiotensin receptor blockers, diuretics, digoxin, and antihyperlipidemics. Flowsheet Row CARDIAC REHAB PHASE II EXERCISE from 09/26/2015 in [place]  Date 08/22/15  Educator D. Coad  Instruction Review Code 2- meets goals/outcomes       Congestive Heart Failure -Discuss the definition of CHF, how to live with CHF, the signs and symptoms of CHF, and how keep track of weight and sodium intake. Flowsheet Row CARDIAC REHAB PHASE II EXERCISE from 09/26/2015 in [place]  Date 08/29/15  Educator DC  Instruction Review Code 2- meets goals/outcomes       Heart Disease and Intimacy -Discus the effect sexual activity has on the heart, how changes occur during intimacy as we age, and safety during sexual activity. Flowsheet Row CARDIAC REHAB PHASE II EXERCISE from 09/26/2015 in [place]  Date 09/05/15  Educator DC  Instruction Review Code (retired) 2- meets goals/outcomes       Smoking Cessation / COPD -Discuss different methods to quit smoking, the health benefits of quitting smoking, and the definition  of COPD. Flowsheet Row CARDIAC REHAB PHASE II EXERCISE from 09/26/2015 in [place]  Date 09/12/15  Educator Russella Dar  Instruction Review Code (retired) 2- meets goals/outcomes       Nutrition I: Fats -Discuss the types of cholesterol, what cholesterol does to the heart, and how cholesterol levels can be controlled. Flowsheet Row CARDIAC REHAB PHASE II EXERCISE from 09/26/2015 in [place]  Date 09/19/15  Educator Russella Dar  Instruction Review Code 2- meets goals/outcomes       Nutrition II: Labels -Discuss the different components of food labels and how to read food label [place] from 09/26/2015 in [place]  Date 09/26/15  Educator Russella Dar  Instruction Review Code 2- meets goals/outcomes       Heart Parts/Heart Disease and PAD -Discuss the anatomy of the heart, the pathway of blood circulation through  the heart, and these are affected by heart disease. Flowsheet Row CARDIAC REHAB PHASE II EXERCISE from 04/24/2021 in [facility]  Date 03/13/21  Educator pb  Instruction Review Code 1- Verbalizes Understanding       Stress I: Signs and Symptoms -Discuss the causes of stress, how stress may lead to anxiety and depression, and ways to limit stress. Flowsheet Row CARDIAC REHAB PHASE II EXERCISE from 09/26/2015 in [facility]  Date 07/11/15  Educator D Coad  Instruction Review Code 2- meets goals/outcomes       Stress II: Relaxation -Discuss different types of relaxation techniques to limit stress. Flowsheet Row CARDIAC REHAB PHASE II EXERCISE from 04/24/2021 in [place]  Date 03/27/21  Educator DF  Instruction Review Code 2- Demonstrated Understanding       Warning Signs of Stroke / TIA -Discuss definition of a stroke, what the signs and symptoms are of a stroke, and how to identify  when someone is having stroke. Flowsheet Row CARDIAC REHAB PHASE II EXERCISE from 04/24/2021 in [place]  Date 04/03/21  Educator DF  Instruction Review Code 2- Demonstrated Understanding       Knowledge Questionnaire Score:  Knowledge Questionnaire Score - 03/07/21 1311       Knowledge Questionnaire Score   Pre Score 21/24             Core Components/Risk Factors/Patient Goals at Admission:  Personal Goals and Risk Factors at Admission - 03/07/21 1355       Core Components/Risk Factors/Patient Goals on Admission    Weight Management Obesity    Improve shortness of breath with ADL's Yes    Intervention Provide education, individualized exercise plan and daily activity instruction to help decrease symptoms of SOB with activities of daily living.    Expected Outcomes Short Term: Improve cardiorespiratory fitness to achieve a reduction of symptoms when performing ADLs;Long Term: Be able to perform more ADLs without symptoms or delay the onset of symptoms    Hypertension Yes    Intervention Monitor prescription use compliance.;Provide education on lifestyle modifcations including regular physical activity/exercise, weight management, moderate sodium restriction and increased consumption of fresh fruit, vegetables, and low fat dairy, alcohol moderation, and smoking cessation.    Expected Outcomes Long Term: Maintenance of blood pressure at goal levels.    Lipids Yes    Intervention Provide education and support for participant on nutrition & aerobic/resistive exercise along with prescribed medications to achieve LDL 70mg , HDL >40mg .    Expected Outcomes Long Term: Cholesterol controlled with medications as prescribed, with individualized exercise RX and with personalized nutrition plan. Value goals: LDL < 70mg , HDL > 40 mg.    Personal Goal Other Yes    Personal Goal Patient wants to get stronger; have more energy and be able to do his ADL's    Intervention  Paitent will attend the CR program with exercise and education for lifestyle modification.    Expected Outcomes Patient will complete the program meeting both program and personal goals.             Core Components/Risk Factors/Patient Goals Review:   Goals and Risk Factor Review     Row Name 03/20/21 1353 04/22/21 0852           Core Components/Risk Factors/Patient Goals Review   Personal Goals Review Hypertension;Lipids;Improve shortness of breath with ADL's;Other Hypertension;Lipids;Improve shortness of breath with ADL's;Other      Review Patient was referred to  CR with [facility] plac[facility]. He has multiple risk factors for CAD and is participating in the program for risk modification. He is new to the program completing 5 sessions losing with his initial weight of 156.0 and his current weight of 155.5. He does have to stop multiple time during exercise due to SOB, lower extremity pain and fatigue. His personal goals for the program are to increase his strength and energy and be able to do his ADL's. We will continues to monitor his progress as he works towards meeting these goals. Patient has completed 17 session with current with 156 lbs. He continues to do well in the program with consistent attendance. His progression has been impeded by his SOB and claudication secondary to PAD. He frequently has to stop during exercise but is stopping less now. He blood pressure is usually at goal but is sometimes elevated initially. He says he feels like the program is helping him. He saw his pcp 04/11/21 for routine check and he complained of fatigue and SOB to her. No changes were made in his medication regimen. His personal goals continue to be to increase his strenght and energy and be able to do his ADL's. We will continue to monitor his progress as he works towards meeting these goals.      Expected Outcomes Patient will complete the program meeting both program and personal goals. Patient will complete  the program meeting both program and personal goals.               Core Components/Risk Factors/Patient Goals at Discharge (Final Review):   Goals and Risk Factor Review - 04/22/21 0852       Core Components/Risk Factors/Patient Goals Review   Personal Goals Review Hypertension;Lipids;Improve shortness of breath with ADL's;Other    Review Patient has completed 17 session with current with 156 lbs. He continues to do well in the program with consistent attendance. His progression has been impeded by his SOB and claudication secondary to PAD. He frequently has to stop during exercise but is stopping less now. He blood pressure is usually at goal but is sometimes elevated initially. He says he feels like the program is helping him. He saw his pcp 04/11/21 for routine check and he complained of fatigue and SOB to her. No changes were made in his medication regimen. His personal goals continue to be to increase his strenght and energy and be able to do his ADL's. We will continue to monitor his progress as he works towards meeting these goals.    Expected Outcomes Patient will complete the program meeting both program and personal goals.             ITP Comments:   Comments: ITP REVIEW Pt is making expected progress toward Cardiac Rehab goals after completing 21 sessions. Recommend continued exercise, life style modification, education, and increased stamina and strength.

## 2021-05-03 ENCOUNTER — Encounter (HOSPITAL_COMMUNITY)
Admission: RE | Admit: 2021-05-03 | Discharge: 2021-05-03 | Disposition: A | Discharge status: Home / Self Care | Payer: Medicare Other | Source: Ambulatory Visit | Attending: Cardiology | Admitting: Cardiology

## 2021-05-03 DIAGNOSIS — Z955 Presence of coronary angioplasty implant and graft: Secondary | ICD-10-CM

## 2021-05-03 NOTE — Progress Notes (Signed)
Daily Session Note  Patient Details  Name: William [name]. MRN: [id number] Date of Birth: 17-Mar-1941 Referring Provider:   Flowsheet Row CARDIAC REHAB PHASE II ORIENTATION from 03/07/2021 in [place]  Referring Provider Dr. Domenic Polite       Encounter Date: 05/03/2021  Check In:  Session Check In - 05/03/21 1100       Check-In   Supervising physician immediately available to respond to emergencies [facility] MD immediately available    Physician(s) Dr. Harrington Challenger    Location [facility]    Staff Present Redge Gainer, BS, Exercise Physiologist;Cecil Bixby Kris Mouton, MS, ACSM-CEP, Exercise Physiologist;Debra Wynetta Emery, RN, BSN    Virtual Visit No    Medication changes reported     No    Fall or balance concerns reported    Yes    Comments Patient reports losing his balance at times and feeling dizzy if he gets up too fast.    Tobacco Cessation No Change    Warm-up and Cool-down Performed as group-led instruction    Resistance Training Performed Yes    VAD Patient? No    PAD/SET Patient? No      Pain Assessment   Currently in Pain? No/denies    Pain Score 0-No pain    Multiple Pain Sites No             Capillary Blood Glucose: No results found for this or any previous visit (from the past 24 hour(s)).    Social History   Tobacco Use  Smoking Status Former   Packs/day: 2.00   Years: 30.00   Pack years: 60.00   Types: Cigarettes   Start date: 06/03/1961   Quit date: 11/17/1991   Years since quitting: 29.4  Smokeless Tobacco Former   Types: Snuff, Chew    Goals Met:  Independence with exercise equipment Exercise tolerated well No report of concerns or symptoms today Strength training completed today  Goals Unmet:  Not Applicable  Comments: checkout time is 1200   Dr. Carlyle Dolly is Medical Director for [facility]

## 2021-05-03 NOTE — Progress Notes (Signed)
I have reviewed a Home Exercise Prescription with William [name]. William [name] is not currently exercising at home.  The patient was advised to walk 5 days a week for 30-45 minutes.  William [name] and I discussed how to progress their exercise prescription.  The patient stated that their goals were build back his strength.  The patient stated that they understand the exercise prescription.  We reviewed exercise guidelines, target heart rate during exercise, RPE Scale, weather conditions, NTG use, endpoints for exercise, warmup and cool down.  Patient is encouraged to come to me with any questions. I will continue to follow up with the patient to assist them with progression and safety.

## 2021-05-06 ENCOUNTER — Encounter (HOSPITAL_COMMUNITY)
Admission: RE | Admit: 2021-05-06 | Discharge: 2021-05-06 | Disposition: A | Discharge status: Home / Self Care | Payer: Medicare Other | Source: Ambulatory Visit | Attending: Cardiology | Admitting: Cardiology

## 2021-05-06 DIAGNOSIS — Z955 Presence of coronary angioplasty implant and graft: Secondary | ICD-10-CM

## 2021-05-06 NOTE — Progress Notes (Signed)
Daily Session Note  Patient Details  Name: William [name]. MRN: [id number] Date of Birth: April 02, 1940 Referring Provider:   Flowsheet Row CARDIAC REHAB PHASE II ORIENTATION from 03/07/2021 in [facility]  Referring Provider Dr. Domenic Polite       Encounter Date: 05/06/2021  Check In:  Session Check In - 05/06/21 1100       Check-In   Supervising physician immediately available to respond to emergencies [facility] immediately available    Physician(s) Dr. Audie Box    Location [facility]    Staff Present Aundra Dubin, RN, BSN;Heather Otho Ket, BS, Exercise Physiologist;Melaya [place] Kris Mouton, MS, ACSM-CEP, Exercise Physiologist    Virtual Visit No    Medication changes reported     No    Fall or balance concerns reported    Yes    Comments Patient reports losing his balance at times and feeling dizzy if he gets up too fast.    Tobacco Cessation No Change    Warm-up and Cool-down Performed as group-led instruction    Resistance Training Performed Yes    VAD Patient? No    PAD/SET Patient? No      Pain Assessment   Currently in Pain? No/denies    Pain Score 0-No pain    Multiple Pain Sites No             Capillary Blood Glucose: No results found for this or any previous visit (from the past 24 hour(s)).    Social History   Tobacco Use  Smoking Status Former   Packs/day: 2.00   Years: 30.00   Pack years: 60.00   Types: Cigarettes   Start date: 06/03/1961   Quit date: 11/17/1991   Years since quitting: 29.4  Smokeless Tobacco Former   Types: Snuff, Chew    Goals Met:  Independence with exercise equipment Exercise tolerated well No report of concerns or symptoms today Strength training completed today  Goals Unmet:  Not Applicable  Comments: checkout time is 1200   Dr. Carlyle Dolly is Medical Director for [facility]

## 2021-05-08 ENCOUNTER — Encounter (HOSPITAL_COMMUNITY)
Admission: RE | Admit: 2021-05-08 | Discharge: 2021-05-08 | Disposition: A | Discharge status: Home / Self Care | Payer: Medicare Other | Source: Ambulatory Visit | Attending: Cardiology | Admitting: Cardiology

## 2021-05-08 DIAGNOSIS — Z955 Presence of coronary angioplasty implant and graft: Secondary | ICD-10-CM

## 2021-05-08 NOTE — Progress Notes (Signed)
Daily Session Note  Patient Details  Name: William [name]. MRN: [id number] Date of Birth: [birth year] Referring Provider:   Flowsheet Row CARDIAC REHAB PHASE II ORIENTATION from 03/07/2021 in [facility]  Referring Provider Dr. Domenic Polite       Encounter Date: 05/08/2021  Check In:  Session Check In - 05/08/21 1100       Check-In   Supervising physician immediately available to respond to emergencies [facility] immediately available    Physician(s) Dr. Johnsie Cancel    Location [facility]    Staff Present Aundra Dubin, RN, BSN;Heather Otho Ket, BS, Exercise Physiologist;Keighley Deckman Kris Mouton, MS, ACSM-CEP, Exercise Physiologist    Virtual Visit No    Medication changes reported     No    Fall or balance concerns reported    Yes    Comments Patient reports losing his balance at times and feeling dizzy if he gets up too fast.    Tobacco Cessation No Change    Warm-up and Cool-down Performed as group-led instruction    Resistance Training Performed Yes    VAD Patient? No    PAD/SET Patient? No      Pain Assessment   Currently in Pain? No/denies    Pain Score 0-No pain    Multiple Pain Sites No             Capillary Blood Glucose: No results found for this or any previous visit (from the past 24 hour(s)).    Social History   Tobacco Use  Smoking Status Former   Packs/day: 2.00   Years: 30.00   Pack years: 60.00   Types: Cigarettes   Start date: 06/03/1961   Quit date: 11/17/1991   Years since quitting: 29.4  Smokeless Tobacco Former   Types: Snuff, Chew    Goals Met:  Independence with exercise equipment Exercise tolerated well No report of concerns or symptoms today Strength training completed today  Goals Unmet:  Not Applicable  Comments: checkout time is 1200   Dr. Carlyle Dolly is Medical Director for [facility]

## 2021-05-10 ENCOUNTER — Encounter (HOSPITAL_COMMUNITY)
Admission: RE | Admit: 2021-05-10 | Discharge: 2021-05-10 | Disposition: A | Discharge status: Home / Self Care | Payer: Medicare Other | Source: Ambulatory Visit | Attending: Cardiology | Admitting: Cardiology

## 2021-05-10 DIAGNOSIS — Z955 Presence of coronary angioplasty implant and graft: Secondary | ICD-10-CM | POA: Diagnosis not present

## 2021-05-10 NOTE — Progress Notes (Signed)
Daily Session Note  Patient Details  Name: Ritik Stavola. MRN: [id number] Date of Birth: 12-04-1940 Referring Provider:   Flowsheet Row CARDIAC REHAB PHASE II ORIENTATION from 03/07/2021 in [facility]  Referring Provider Dr. Domenic Polite       Encounter Date: 05/10/2021  Check In:  Session Check In - 05/10/21 1100       Check-In   Supervising physician immediately available to respond to emergencies [facility] immediately available    Physician(s) Dr Marlou Porch    Location [facility]    Staff Present Aundra Dubin, RN, Madlyn Frankel, RN, Bjorn Loser, MS, ACSM-CEP, Exercise Physiologist;Heather Zigmund Lyfe Monger, Exercise Physiologist    Virtual Visit No    Medication changes reported     No    Fall or balance concerns reported    Yes    Comments Patient reports losing his balance at times and feeling dizzy if he gets up too fast.    Tobacco Cessation No Change    Warm-up and Cool-down Performed as group-led instruction    Resistance Training Performed Yes    VAD Patient? No    PAD/SET Patient? No      Pain Assessment   Currently in Pain? No/denies    Pain Score 0-No pain    Multiple Pain Sites No             Capillary Blood Glucose: No results found for this or any previous visit (from the past 24 hour(s)).    Social History   Tobacco Use  Smoking Status Former   Packs/day: 2.00   Years: 30.00   Pack years: 60.00   Types: Cigarettes   Start date: 06/03/1961   Quit date: 11/17/1991   Years since quitting: 29.4  Smokeless Tobacco Former   Types: Snuff, Chew    Goals Met:  Independence with exercise equipment Exercise tolerated well No report of concerns or symptoms today  Goals Unmet:  Not Applicable  Comments: checkout 1200    Dr. Carlyle Dolly is Medical Director for [facility]

## 2021-05-13 ENCOUNTER — Encounter (HOSPITAL_COMMUNITY)
Admission: RE | Admit: 2021-05-13 | Discharge: 2021-05-13 | Disposition: A | Discharge status: Home / Self Care | Payer: Medicare Other | Source: Ambulatory Visit | Attending: Cardiology | Admitting: Cardiology

## 2021-05-13 VITALS — Wt 154.1 lb

## 2021-05-13 DIAGNOSIS — Z955 Presence of coronary angioplasty implant and graft: Secondary | ICD-10-CM | POA: Diagnosis not present

## 2021-05-13 NOTE — Progress Notes (Signed)
Daily Session Note  Patient Details  Name: William [name]. MRN: [id number] Date of Birth: [birth year] Referring Provider:   Flowsheet Row CARDIAC REHAB PHASE II ORIENTATION from 03/07/2021 in [place]  Referring Provider Dr. Domenic Polite       Encounter Date: 05/13/2021  Check In:  Session Check In - 05/13/21 1100       Check-In   Supervising physician immediately available to respond to emergencies [facility] immediately available    Physician(s) Dr Harl Bowie    Location [facility]    Staff Present Geanie Cooley, RN;Debra Wynetta Emery, RN, Madlyn Frankel, RN, Bjorn Loser, MS, ACSM-CEP, Exercise Physiologist;Heather Zigmund Madgeline Rayo, Exercise Physiologist    Virtual Visit No    Medication changes reported     No    Fall or balance concerns reported    Yes    Comments Patient reports losing his balance at times and feeling dizzy if he gets up too fast.    Tobacco Cessation No Change    Warm-up and Cool-down Performed as group-led instruction    Resistance Training Performed Yes    VAD Patient? No      Pain Assessment   Currently in Pain? No/denies    Pain Score 0-No pain    Multiple Pain Sites No             Capillary Blood Glucose: No results found for this or any previous visit (from the past 24 hour(s)).    Social History   Tobacco Use  Smoking Status Former   Packs/day: 2.00   Years: 30.00   Pack years: 60.00   Types: Cigarettes   Start date: 06/03/1961   Quit date: 11/17/1991   Years since quitting: 29.5  Smokeless Tobacco Former   Types: Snuff, Chew    Goals Met:  Independence with exercise equipment Exercise tolerated well No report of concerns or symptoms today  Goals Unmet:  Not Applicable  Comments: checkin 1200    Dr. Carlyle Dolly is Medical Director for [facility]

## 2021-05-15 ENCOUNTER — Encounter (HOSPITAL_COMMUNITY)
Admission: RE | Admit: 2021-05-15 | Discharge: 2021-05-15 | Disposition: A | Discharge status: Home / Self Care | Payer: Medicare Other | Source: Ambulatory Visit | Attending: Cardiology | Admitting: Cardiology

## 2021-05-15 DIAGNOSIS — Z955 Presence of coronary angioplasty implant and graft: Secondary | ICD-10-CM | POA: Diagnosis not present

## 2021-05-15 NOTE — Progress Notes (Signed)
Daily Session Note  Patient Details  Name: William [name]. MRN: [id number] Date of Birth: Jan 05, 1941 Referring Provider:   Flowsheet Row CARDIAC REHAB PHASE II ORIENTATION from 03/07/2021 in [facility]  Referring Provider Dr. Domenic Polite       Encounter Date: 05/15/2021  Check In:  Session Check In - 05/15/21 1100       Check-In   Supervising physician immediately available to respond to emergencies [facility] MD immediately available    Physician(s) Dr Harl Bowie    Location [facility]    Staff Present Hoy Register, MS, ACSM-CEP, Exercise Physiologist;Heather Otho Ket, BS, Exercise Physiologist;Jannell Franta Wynetta Emery, RN, BSN    Virtual Visit No    Medication changes reported     No    Fall or balance concerns reported    Yes    Comments Patient reports losing his balance at times and feeling dizzy if he gets up too fast.    Tobacco Cessation No Change    Warm-up and Cool-down Performed as group-led instruction    Resistance Training Performed Yes    VAD Patient? No    PAD/SET Patient? No      Pain Assessment   Currently in Pain? No/denies    Pain Score 0-No pain    Multiple Pain Sites No             Capillary Blood Glucose: No results found for this or any previous visit (from the past 24 hour(s)).    Social History   Tobacco Use  Smoking Status Former   Packs/day: 2.00   Years: 30.00   Pack years: 60.00   Types: Cigarettes   Start date: 06/03/1961   Quit date: 11/17/1991   Years since quitting: 29.5  Smokeless Tobacco Former   Types: Snuff, Chew    Goals Met:  Independence with exercise equipment Exercise tolerated well No report of concerns or symptoms today Strength training completed today  Goals Unmet:  Not Applicable  Comments: Check out 1200.   Dr. Carlyle Dolly is Medical Director for [facility] Cardiac [facility]

## 2021-05-17 ENCOUNTER — Encounter (HOSPITAL_COMMUNITY)
Admission: RE | Admit: 2021-05-17 | Discharge: 2021-05-17 | Disposition: A | Discharge status: Home / Self Care | Payer: Medicare Other | Source: Ambulatory Visit | Attending: Cardiology | Admitting: Cardiology

## 2021-05-17 DIAGNOSIS — Z955 Presence of coronary angioplasty implant and graft: Secondary | ICD-10-CM | POA: Diagnosis not present

## 2021-05-17 NOTE — Progress Notes (Signed)
Daily Session Note  Patient Details  Name: William [name]. MRN: [id number] Date of Birth: 1941-01-21 Referring Provider:   Flowsheet Row CARDIAC REHAB PHASE II ORIENTATION from 03/07/2021 in [place]  Referring Provider Dr. Domenic Polite       Encounter Date: 05/17/2021  Check In:  Session Check In - 05/17/21 1100       Check-In   Supervising physician immediately available to respond to emergencies [facility] MD immediately available    Physician(s) Dr. Harrington Challenger    Location [facility]    Staff Present Geanie Cooley, RN;Dalton Kris Mouton, MS, ACSM-CEP, Exercise Physiologist;Heather Zigmund Daniel, Exercise Physiologist    Virtual Visit No    Medication changes reported     No    Fall or balance concerns reported    Yes    Comments Patient reports losing his balance at times and feeling dizzy if he gets up too fast.    Tobacco Cessation No Change    Warm-up and Cool-down Performed as group-led instruction    Resistance Training Performed Yes    VAD Patient? No    PAD/SET Patient? No      Pain Assessment   Currently in Pain? No/denies    Pain Score 0-No pain    Multiple Pain Sites No             Capillary Blood Glucose: No results found for this or any previous visit (from the past 24 hour(s)).    Social History   Tobacco Use  Smoking Status Former   Packs/day: 2.00   Years: 30.00   Pack years: 60.00   Types: Cigarettes   Start date: 06/03/1961   Quit date: 11/17/1991   Years since quitting: 29.5  Smokeless Tobacco Former   Types: Snuff, Chew    Goals Met:  Independence with exercise equipment Exercise tolerated well No report of concerns or symptoms today Strength training completed today  Goals Unmet:  Not Applicable  Comments: check out @ 12:00pm   Dr. Carlyle Dolly is Medical Director for [facility]

## 2021-05-20 ENCOUNTER — Encounter (HOSPITAL_COMMUNITY)
Admission: RE | Admit: 2021-05-20 | Discharge: 2021-05-20 | Disposition: A | Discharge status: Home / Self Care | Payer: Medicare Other | Source: Ambulatory Visit | Attending: Cardiology | Admitting: Cardiology

## 2021-05-20 DIAGNOSIS — Z955 Presence of coronary angioplasty implant and graft: Secondary | ICD-10-CM

## 2021-05-20 NOTE — Progress Notes (Signed)
Daily Session Note  Patient Details  Name: William [name]. MRN: [id number] Date of Birth: 1940-12-04 Referring Provider:   Flowsheet Row CARDIAC REHAB PHASE II ORIENTATION from 03/07/2021 in [place]  Referring Provider Dr. Domenic Polite       Encounter Date: 05/20/2021  Check In:  Session Check In - 05/20/21 1100       Check-In   Supervising physician immediately available to respond to emergencies [facility] MD immediately available    Physician(s) Dr. Harl Bowie    Location [facility]    Staff Present Hoy Register, MS, ACSM-CEP, Exercise Physiologist;Heather Otho Ket, BS, Exercise Physiologist;Maloni Musleh Wynetta Emery, RN, BSN    Virtual Visit No    Medication changes reported     No    Fall or balance concerns reported    Yes    Comments Patient reports losing his balance at times and feeling dizzy if he gets up too fast.    Tobacco Cessation No Change    Warm-up and Cool-down Performed as group-led instruction    Resistance Training Performed Yes    VAD Patient? No    PAD/SET Patient? No      Pain Assessment   Currently in Pain? No/denies    Pain Score 0-No pain    Multiple Pain Sites No             Capillary Blood Glucose: No results found for this or any previous visit (from the past 24 hour(s)).    Social History   Tobacco Use  Smoking Status Former   Packs/day: 2.00   Years: 30.00   Pack years: 60.00   Types: Cigarettes   Start date: 06/03/1961   Quit date: 11/17/1991   Years since quitting: 29.5  Smokeless Tobacco Former   Types: Snuff, Chew    Goals Met:  Independence with exercise equipment Exercise tolerated well No report of concerns or symptoms today Strength training completed today  Goals Unmet:  Not Applicable  Comments: Check out 1200.   Dr. Carlyle Dolly is Medical Director for [facility]

## 2021-05-22 ENCOUNTER — Encounter (HOSPITAL_COMMUNITY)
Admission: RE | Admit: 2021-05-22 | Discharge: 2021-05-22 | Disposition: A | Discharge status: Home / Self Care | Payer: Medicare Other | Source: Ambulatory Visit | Attending: Cardiology | Admitting: Cardiology

## 2021-05-22 DIAGNOSIS — Z955 Presence of coronary angioplasty implant and graft: Secondary | ICD-10-CM

## 2021-05-22 NOTE — Progress Notes (Signed)
Daily Session Note  Patient Details  Name: William [name]. MRN: [id number] Date of Birth: 20-Jun-1940 Referring Provider:   Flowsheet Row CARDIAC REHAB PHASE II ORIENTATION from 03/07/2021 in [place]  Referring Provider Dr. Domenic Polite       Encounter Date: 05/22/2021  Check In:  Session Check In - 05/22/21 1100       Check-In   Supervising physician immediately available to respond to emergencies [facility] MD immediately available    Physician(s) Dr. Domenic Polite    Location [facility]    Staff Present Hoy Register, MS, ACSM-CEP, Exercise Physiologist;Heather Otho Ket, BS, Exercise Physiologist;Debra Wynetta Emery, RN, BSN    Virtual Visit No    Medication changes reported     No    Fall or balance concerns reported    Yes    Comments Patient reports losing his balance at times and feeling dizzy if he gets up too fast.    Tobacco Cessation No Change    Warm-up and Cool-down Performed as group-led instruction    Resistance Training Performed Yes    VAD Patient? No    PAD/SET Patient? No      Pain Assessment   Currently in Pain? No/denies    Pain Score 0-No pain    Multiple Pain Sites No             Capillary Blood Glucose: No results found for this or any previous visit (from the past 24 hour(s)).    Social History   Tobacco Use  Smoking Status Former   Packs/day: 2.00   Years: 30.00   Pack years: 60.00   Types: Cigarettes   Start date: 06/03/1961   Quit date: 11/17/1991   Years since quitting: 29.5  Smokeless Tobacco Former   Types: Snuff, Chew    Goals Met:  Independence with exercise equipment Exercise tolerated well No report of concerns or symptoms today Strength training completed today  Goals Unmet:  Not Applicable  Comments: checkout time is 1200   Dr. Carlyle Dolly is Medical Director for [facility]

## 2021-05-24 ENCOUNTER — Encounter (HOSPITAL_COMMUNITY)
Admission: RE | Admit: 2021-05-24 | Discharge: 2021-05-24 | Disposition: A | Discharge status: Home / Self Care | Payer: Medicare Other | Source: Ambulatory Visit | Attending: Cardiology | Admitting: Cardiology

## 2021-05-24 ENCOUNTER — Other Ambulatory Visit: Payer: Self-pay

## 2021-05-24 ENCOUNTER — Ambulatory Visit (INDEPENDENT_AMBULATORY_CARE_PROVIDER_SITE_OTHER): Payer: Medicare Other | Admitting: Nurse Practitioner

## 2021-05-24 ENCOUNTER — Other Ambulatory Visit: Payer: Self-pay | Admitting: Nurse Practitioner

## 2021-05-24 ENCOUNTER — Encounter: Payer: Self-pay | Admitting: Nurse Practitioner

## 2021-05-24 VITALS — BP 138/50 | HR 61 | Temp 97.9°F | Ht 66.0 in | Wt 158.0 lb

## 2021-05-24 DIAGNOSIS — I10 Essential (primary) hypertension: Secondary | ICD-10-CM

## 2021-05-24 DIAGNOSIS — Z955 Presence of coronary angioplasty implant and graft: Secondary | ICD-10-CM | POA: Diagnosis not present

## 2021-05-24 DIAGNOSIS — E559 Vitamin D deficiency, unspecified: Secondary | ICD-10-CM | POA: Diagnosis not present

## 2021-05-24 DIAGNOSIS — R5383 Other fatigue: Secondary | ICD-10-CM | POA: Diagnosis not present

## 2021-05-24 DIAGNOSIS — K219 Gastro-esophageal reflux disease without esophagitis: Secondary | ICD-10-CM | POA: Diagnosis not present

## 2021-05-24 DIAGNOSIS — E875 Hyperkalemia: Secondary | ICD-10-CM

## 2021-05-24 LAB — COMPREHENSIVE METABOLIC PANEL
ALT: 19 U/L (ref 0–53)
AST: 21 U/L (ref 0–37)
Albumin: 4.2 g/dL (ref 3.5–5.2)
Alkaline Phosphatase: 43 U/L (ref 39–117)
BUN: 38 mg/dL — ABNORMAL HIGH (ref 6–23)
CO2: 25 mEq/L (ref 19–32)
Calcium: 9.2 mg/dL (ref 8.4–10.5)
Chloride: 107 mEq/L (ref 96–112)
Creatinine, Ser: 1.62 mg/dL — ABNORMAL HIGH (ref 0.40–1.50)
GFR: 39.99 mL/min — ABNORMAL LOW (ref >60.00)
Glucose, Bld: 101 mg/dL — ABNORMAL HIGH (ref 70–99)
Potassium: 5.2 mEq/L — ABNORMAL HIGH (ref 3.5–5.1)
Sodium: 136 mEq/L (ref 135–145)
Total Bilirubin: 0.6 mg/dL (ref 0.2–1.2)
Total Protein: 6.9 g/dL (ref 6.0–8.3)

## 2021-05-24 LAB — TSH: TSH: 1.73 u[IU]/mL (ref 0.35–5.50)

## 2021-05-24 LAB — T4, FREE: Free T4: 0.78 ng/dL (ref 0.60–1.60)

## 2021-05-24 LAB — T3, FREE: T3, Free: 3.2 pg/mL (ref 2.3–4.2)

## 2021-05-24 MED ORDER — THYROID 30 MG PO TABS: 30.0000 mg | ORAL_TABLET | Freq: Every day | ORAL | 1 refills | Status: DC

## 2021-05-24 NOTE — Progress Notes (Signed)
Subjective:  Patient ID: William Bachelor., male    DOB: 03-Apr-1940  Age: 81 y.o. MRN: [id number]  CC:  Chief Complaint  Patient presents with   Follow-up    Vitamin D deficiency, GERD, hypertension, fatigue      HPI  This patient arrives today for the above.  Vitamin D deficiency: Last lab draw showed vitamin D level of 49.  Patient continues on his vitamin D 3 supplement.  GERD: He continues on 20 mg of Pepcid every evening.  He stopped his Prilosec after his last office visit.  He tells me he has not experienced any heartburn since stopping the Prilosec.  We have talked about referral to GI if heartburn returns for further evaluation, he has been hesitant to do this but we have discussed risk of missed diagnoses if he does not consider referral with them.  Hypertension: He continues on his Norvasc, Imdur, Lopressor, and Benicar.  He is tolerating the medication well.  He does follow with cardiology on a regular basis.  He denies any chest pain.  He does have shortness of breath with activity which is been a chronic concern of his.  He is seeing cardiac rehab for the treatment of this.  Fatigue: He is on NP thyroid for the off label treatment of his fatigue.  He is tolerating medication well.  We did reduce his dose of NP thyroid to 30 mg by mouth daily based on his last T3 level being slightly elevated.  He tells me since making this change he has not noticed any worsening fatigue or cardiac palpitations.  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 Widower. Lives alone.   Social History   Tobacco Use   Smoking status: Former    Packs/day: 2.00    Years: 30.00    Pack years: 60.00    Types: Cigarettes    Start date: 06/03/1961    Quit date: 11/17/1991    Years since quitting: 29.5   Smokeless tobacco: Former    Types: Snuff, Chew  Substance Use Topics   Alcohol use: Not Currently    Alcohol/week: 2.0 - 4.0 standard drinks    Types: 2 - 4 Standard drinks or equivalent per week    Comment: used to, quit 2012     Current Meds  Medication Sig   amLODipine (NORVASC) 10 MG tablet Take 1 tablet (10 mg total) by mouth daily.   Ascorbic Acid (VITAMIN C) 1000 MG tablet Take 1,000 mg by mouth in the morning.   aspirin EC 81 MG tablet Take 1 tablet (81 mg total) by mouth daily.  cholecalciferol (VITAMIN D) 25 MCG (1000 UNIT) tablet Take 1,000 Units by mouth in the morning.   ezetimibe (ZETIA) 10 MG tablet Take 1 tablet (10 mg total) by mouth daily.   famotidine (PEPCID) 20 MG tablet Take 20 mg by mouth every evening.   fish oil-omega-3 fatty acids 1000 MG capsule Take 1,000 mg by mouth in the morning.   folic acid (FOLVITE) 778 MCG tablet Take 400 mcg by mouth in the morning.   Garlic 2423 MG TBEC Take 2,000 mg by mouth in the morning.   isosorbide mononitrate (IMDUR) 60 MG 24 hr tablet Take 1 tablet (60 mg total) by mouth daily.   metoprolol tartrate (LOPRESSOR) 50 MG tablet Take 1 tablet (50 mg total) by mouth 2 (two) times daily.   Multiple Vitamin (MULTIVITAMIN WITH MINERALS) TABS tablet Take 1 tablet by mouth in the morning.   nitroGLYCERIN (NITROSTAT) 0.4 MG SL tablet Place 1 tablet (0.4 mg total) under the tongue every 5 (five) minutes as needed for chest pain. If pain does not resolve with rest.    olmesartan (BENICAR) 40 MG tablet Take 1 tablet (40 mg total) by mouth daily.   omega-3 acid ethyl esters (LOVAZA) 1 g capsule Take 1 capsule (1 g total) by mouth daily.   rosuvastatin (CRESTOR) 10 MG tablet TAKE 1 TABLET BY MOUTH EVERY DAY N   thiamine (VITAMIN B-1) 100 MG tablet Take 100 mg by mouth every evening.    thyroid (NP THYROID) 30 MG tablet Take 1 tablet (30 mg total) by mouth daily before breakfast.   ticagrelor (BRILINTA) 90 MG TABS tablet Take 1 tablet (90 mg total) by mouth 2 (two) times daily.   [DISCONTINUED] omeprazole (PRILOSEC) 20 MG capsule Take 20 mg by mouth every morning.    ROS:  Review of Systems  Eyes:  Negative for blurred vision.  Respiratory:  Positive for shortness of breath (with exertion).   Cardiovascular:  Negative for chest pain.  Neurological:  Negative for dizziness and headaches.    Objective:   Today's Vitals: BP (!) 138/50 (BP Location: Left Arm, Patient Position: Sitting, Cuff Size: Large)    Pulse 61    Temp 97.9 F (36.6 C) (Oral)    Ht 5\' 6"  (1.676 m)    Wt 158 lb (71.7 kg)    SpO2 94%    BMI 25.50 kg/m  Vitals with BMI 05/24/2021 05/13/2021 04/29/2021  Height 5\' 6"  - -  Weight 158 lbs 154 lbs 2 oz 155 lbs 7 oz  BMI 53.61 - 44.3  Systolic 154 - -  Diastolic 50 - -  Pulse 61 - -     Physical Exam Vitals reviewed.  Constitutional:      Appearance: Normal appearance.  HENT:     Head: Normocephalic and atraumatic.  Cardiovascular:     Rate and Rhythm: Normal rate and regular rhythm.  Pulmonary:     Effort: Pulmonary effort is normal.     Breath sounds: Normal breath sounds.  Musculoskeletal:     Cervical back: Neck supple.  Skin:    General: Skin is warm and dry.  Neurological:     Mental Status: He is alert and oriented to person, place, and time.  Psychiatric:        Mood and Affect: Mood normal.        Behavior: Behavior normal.        Thought Content: Thought content normal.        Judgment: Judgment normal.  Assessment and Plan   1. Fatigue, unspecified type   2. Vitamin D deficiency   3. Essential hypertension   4. Gastroesophageal reflux disease, unspecified whether esophagitis present      Plan: 1.  He will continue on NP thyroid as currently prescribed (he is requesting refill today).  We will check thyroid panel today, and may make dosage changes based on this.  He will also continue to work with cardiac rehab to help with his shortness of breath and energy. 2.  He will continue on his vitamin D3 supplement. 3.  Blood pressure relatively well controlled on current regimen he will continue taking medications as prescribed. 4.  We did discuss stopping his Pepcid, which he is agreeable to.  If symptoms return he will notify our office and we will determine if he is willing to be referred to GI at that time.   Tests ordered Orders Placed This Encounter  Procedures   TSH   T4, free   T3, free   Comprehensive metabolic panel      Meds ordered this encounter  Medications   thyroid (NP THYROID) 30 MG tablet    Sig: Take 1 tablet (30 mg total) by mouth daily before breakfast.    Dispense:  90 tablet    Refill:  1    Order Specific Question:   Supervising Provider    Answer:   Binnie Rail [[id number]]    Patient to follow-up in 3 months, or sooner as needed.  Ailene Ards, NP

## 2021-05-24 NOTE — Progress Notes (Signed)
Daily Session Note  Patient Details  Name: William [name]. MRN: [id number] Date of Birth: 01-03-1941 Referring Provider:   Flowsheet Row CARDIAC REHAB PHASE II ORIENTATION from 03/07/2021 in [place]  Referring Provider Dr. Domenic Polite       Encounter Date: 05/24/2021  Check In:  Session Check In - 05/24/21 1100       Check-In   Supervising physician immediately available to respond to emergencies [facility] MD immediately available    Physician(s) Dr. Domenic Polite    Location [facility]    Staff Present Redge Gainer, BS, Exercise Physiologist;Axil Copeman Wynetta Emery, RN, BSN;Other   Benay Pillow   Virtual Visit No    Medication changes reported     No    Fall or balance concerns reported    Yes    Comments Patient reports losing his balance at times and feeling dizzy if he gets up too fast.    Tobacco Cessation No Change    Warm-up and Cool-down Performed as group-led instruction    Resistance Training Performed Yes    VAD Patient? No    PAD/SET Patient? No      Pain Assessment   Currently in Pain? No/denies    Pain Score 0-No pain    Multiple Pain Sites No             Capillary Blood Glucose: No results found for this or any previous visit (from the past 24 hour(s)).    Social History   Tobacco Use  Smoking Status Former   Packs/day: 2.00   Years: 30.00   Pack years: 60.00   Types: Cigarettes   Start date: 06/03/1961   Quit date: 11/17/1991   Years since quitting: 29.5  Smokeless Tobacco Former   Types: Snuff, Chew    Goals Met:  Independence with exercise equipment Changing diet to healthy choices, watching portion sizes No report of concerns or symptoms today Strength training completed today  Goals Unmet:  Not Applicable  Comments: Check out 1200.   Dr. Carlyle Dolly is Medical Director for [facility]

## 2021-05-27 ENCOUNTER — Encounter (HOSPITAL_COMMUNITY)
Admission: RE | Admit: 2021-05-27 | Discharge: 2021-05-27 | Disposition: A | Discharge status: Home / Self Care | Payer: Medicare Other | Source: Ambulatory Visit | Attending: Cardiology | Admitting: Cardiology

## 2021-05-27 ENCOUNTER — Other Ambulatory Visit (HOSPITAL_COMMUNITY)
Admission: RE | Admit: 2021-05-27 | Discharge: 2021-05-27 | Disposition: A | Discharge status: Home / Self Care | Payer: Medicare Other | Source: Ambulatory Visit | Attending: Nurse Practitioner | Admitting: Nurse Practitioner

## 2021-05-27 ENCOUNTER — Other Ambulatory Visit: Payer: Self-pay | Admitting: Nurse Practitioner

## 2021-05-27 DIAGNOSIS — E878 Other disorders of electrolyte and fluid balance, not elsewhere classified: Secondary | ICD-10-CM

## 2021-05-27 DIAGNOSIS — E875 Hyperkalemia: Secondary | ICD-10-CM | POA: Insufficient documentation

## 2021-05-27 DIAGNOSIS — Z955 Presence of coronary angioplasty implant and graft: Secondary | ICD-10-CM | POA: Diagnosis not present

## 2021-05-27 LAB — BASIC METABOLIC PANEL WITH GFR
Anion gap: 3 — ABNORMAL LOW (ref 5–15)
BUN: 44 mg/dL — ABNORMAL HIGH (ref 8–23)
CO2: 20 mmol/L — ABNORMAL LOW (ref 22–32)
Calcium: 8.9 mg/dL (ref 8.9–10.3)
Chloride: 113 mmol/L — ABNORMAL HIGH (ref 98–111)
Creatinine, Ser: 1.5 mg/dL — ABNORMAL HIGH (ref 0.61–1.24)
GFR, Estimated: 47 mL/min — ABNORMAL LOW (ref >60)
Glucose, Bld: 87 mg/dL (ref 70–99)
Potassium: 4.8 mmol/L (ref 3.5–5.1)
Sodium: 136 mmol/L (ref 135–145)

## 2021-05-27 NOTE — Progress Notes (Signed)
Daily Session Note  Patient Details  Name: William [name]. MRN: [id number] Date of Birth: 06-Nov-1940 Referring Provider:   Flowsheet Row CARDIAC REHAB PHASE II ORIENTATION from 03/07/2021 in [place]  Referring Provider Dr. Domenic Polite       Encounter Date: 05/27/2021  Check In:  Session Check In - 05/27/21 1100       Check-In   Supervising physician immediately available to respond to emergencies [facility] MD immediately available    Physician(s) Dr Harl Bowie    Location [facility]    Staff Present Jilda Roche, RN, Bjorn Loser, MS, ACSM-CEP, Exercise Physiologist;Heather Zigmund Pernie Grosso, Exercise Physiologist    Virtual Visit No    Medication changes reported     No    Fall or balance concerns reported    Yes    Comments Patient reports losing his balance at times and feeling dizzy if he gets up too fast.    Tobacco Cessation No Change    Warm-up and Cool-down Performed as group-led instruction    Resistance Training Performed Yes    VAD Patient? No    PAD/SET Patient? No      Pain Assessment   Currently in Pain? No/denies    Pain Score 0-No pain             Capillary Blood Glucose: No results found for this or any previous visit (from the past 24 hour(s)).    Social History   Tobacco Use  Smoking Status Former   Packs/day: 2.00   Years: 30.00   Pack years: 60.00   Types: Cigarettes   Start date: 06/03/1961   Quit date: 11/17/1991   Years since quitting: 29.5  Smokeless Tobacco Former   Types: Snuff, Chew    Goals Met:  Independence with exercise equipment Exercise tolerated well No report of concerns or symptoms today Strength training completed today  Goals Unmet:  Not Applicable  Comments: checkout 1230    Dr. Carlyle Dolly is Medical Director for [facility]

## 2021-05-29 ENCOUNTER — Encounter (HOSPITAL_COMMUNITY)
Admission: RE | Admit: 2021-05-29 | Discharge: 2021-05-29 | Disposition: A | Discharge status: Home / Self Care | Payer: Medicare Other | Source: Ambulatory Visit | Attending: Cardiology | Admitting: Cardiology

## 2021-05-29 DIAGNOSIS — Z955 Presence of coronary angioplasty implant and graft: Secondary | ICD-10-CM | POA: Diagnosis not present

## 2021-05-29 NOTE — Progress Notes (Signed)
Daily Session Note ? ?Patient Details  ?Name: William D Turski Jr. ?MRN: [id number] ?Date of Birth: 11/14/1940 ?Referring Provider:   ?Flowsheet Row CARDIAC REHAB PHASE II ORIENTATION from 03/07/2021 in [place] C[facility]  ?Referring Provider Dr. McDowell  ? ?  ? ? ?Encounter Date: 05/29/2021 ? ?Check In: ? Session Check In - 05/29/21 1112   ? ?  ? Check-In  ? Supervising physician immediately available to respond to emergencies [facility] MD immediately available   ? Physician(s) Dr Branch   ? Location [facility]   ? Staff Present Dalton Fletcher, MS, ACSM-CEP, Exercise Physiologist;Heather Jachimiak, BS, Exercise Physiologist;Debra Johnson, RN, BSN   ? Virtual Visit No   ? Medication changes reported     No   ? Fall or balance concerns reported    Yes   ? Comments Patient reports losing his balance at times and feeling dizzy if he gets up too fast.   ? Tobacco Cessation No Change   ? Warm-up and Cool-down Performed as group-led instruction   ? Resistance Training Performed Yes   ? VAD Patient? No   ? PAD/SET Patient? No   ?  ? Pain Assessment  ? Currently in Pain? No/denies   ? Pain Score 0-No pain   ? Multiple Pain Sites No   ? ?  ?  ? ?  ? ? ?Capillary Blood Glucose: ?No results found for this or any previous visit (from the past 24 hour(s)). ? ? ? ?Social History  ? ?Tobacco Use  ?Smoking Status Former  ? Packs/day: 2.00  ? Years: 30.00  ? Pack years: 60.00  ? Types: Cigarettes  ? Start date: 06/03/1961  ? Quit date: 11/17/1991  ? Years since quitting: 29.5  ?Smokeless Tobacco Former  ? Types: Snuff, Chew  ? ? ?Goals Met:  ?Independence with exercise equipment ?Exercise tolerated well ?No report of concerns or symptoms today ?Strength training completed today ? ?Goals Unmet:  ?Not Applicable ? ?Comments: Check out 1200 ? ? ?Dr. Jonathan Branch is Medical Director for [facility] ?

## 2021-05-31 ENCOUNTER — Other Ambulatory Visit (HOSPITAL_COMMUNITY)
Admission: RE | Admit: 2021-05-31 | Discharge: 2021-05-31 | Disposition: A | Discharge status: Home / Self Care | Payer: Medicare Other | Source: Ambulatory Visit | Attending: Nurse Practitioner | Admitting: Nurse Practitioner

## 2021-05-31 ENCOUNTER — Encounter (HOSPITAL_COMMUNITY)
Admission: RE | Admit: 2021-05-31 | Discharge: 2021-05-31 | Disposition: A | Discharge status: Home / Self Care | Payer: Medicare Other | Source: Ambulatory Visit | Attending: Cardiology | Admitting: Cardiology

## 2021-05-31 VITALS — Ht 66.0 in | Wt 154.3 lb

## 2021-05-31 DIAGNOSIS — Z955 Presence of coronary angioplasty implant and graft: Secondary | ICD-10-CM

## 2021-05-31 DIAGNOSIS — E878 Other disorders of electrolyte and fluid balance, not elsewhere classified: Secondary | ICD-10-CM | POA: Diagnosis not present

## 2021-05-31 LAB — COMPREHENSIVE METABOLIC PANEL
ALT: 23 U/L (ref 0–44)
AST: 27 U/L (ref 15–41)
Albumin: 3.7 g/dL (ref 3.5–5.0)
Alkaline Phosphatase: 43 U/L (ref 38–126)
Anion gap: 8 (ref 5–15)
BUN: 40 mg/dL — ABNORMAL HIGH (ref 8–23)
CO2: 20 mmol/L — ABNORMAL LOW (ref 22–32)
Calcium: 8.9 mg/dL (ref 8.9–10.3)
Chloride: 110 mmol/L (ref 98–111)
Creatinine, Ser: 1.38 mg/dL — ABNORMAL HIGH (ref 0.61–1.24)
GFR, Estimated: 52 mL/min — ABNORMAL LOW (ref >60)
Glucose, Bld: 91 mg/dL (ref 70–99)
Potassium: 5 mmol/L (ref 3.5–5.1)
Sodium: 138 mmol/L (ref 135–145)
Total Bilirubin: 0.5 mg/dL (ref 0.3–1.2)
Total Protein: 7 g/dL (ref 6.5–8.1)

## 2021-05-31 NOTE — Progress Notes (Signed)
Daily Session Note ? ?Patient Details  ?Name: William [name]. ?MRN: [id number] ?Date of Birth: 1940-06-05 ?Referring Provider:   ?Flowsheet Row CARDIAC REHAB PHASE II ORIENTATION from 03/07/2021 in [place]  ?Referring Provider Dr. Domenic Polite  ? ?  ? ? ?Encounter Date: 05/31/2021 ? ?Check In: ? Session Check In - 05/31/21 1100   ? ?  ? Check-In  ? Supervising physician immediately available to respond to emergencies [facility] MD immediately available   ? Physician(s) Dr Harl Bowie   ? Location [facility]   ? Staff Present Hoy Register, MS, ACSM-CEP, Exercise Physiologist;Debra Wynetta Emery, RN, BSN;Meer Reindl, RN   ? Virtual Visit No   ? Medication changes reported     No   ? Fall or balance concerns reported    Yes   ? Comments Patient reports losing his balance at times and feeling dizzy if he gets up too fast.   ? Tobacco Cessation No Change   ? Warm-up and Cool-down Performed as group-led instruction   ? Resistance Training Performed Yes   ? VAD Patient? No   ? PAD/SET Patient? No   ?  ? Pain Assessment  ? Currently in Pain? No/denies   ? Pain Score 0-No pain   ? Multiple Pain Sites No   ? ?  ?  ? ?  ? ? ?Capillary Blood Glucose: ?No results found for this or any previous visit (from the past 24 hour(s)). ? ? ? ?Social History  ? ?Tobacco Use  ?Smoking Status Former  ? Packs/day: 2.00  ? Years: 30.00  ? Pack years: 60.00  ? Types: Cigarettes  ? Start date: 06/03/1961  ? Quit date: 11/17/1991  ? Years since quitting: 29.5  ?Smokeless Tobacco Former  ? Types: Snuff, Chew  ? ? ?Goals Met:  ?Independence with exercise equipment ?Exercise tolerated well ?No report of concerns or symptoms today ?Strength training completed today ? ?Goals Unmet:  ?Not Applicable ? ?Comments: check out @ 12:00pm ? ? ?Dr. Carlyle Dolly is Medical Director for [facility] ?

## 2021-06-12 NOTE — Progress Notes (Signed)
Discharge Progress Report ? ?Patient Details  ?Name: William [name]. ?MRN: [id number] ?Date of Birth: 03-Aug-1940 ?Referring Provider:   ?Flowsheet Row CARDIAC REHAB PHASE II ORIENTATION from 03/07/2021 in [place]  ?Referring Provider Dr. Domenic Polite  ? ?  ? ? ? ?Number of Visits: 17 ? ?Reason for Discharge:  ?Patient reached a stable level of exercise. ?Patient independent in their exercise. ?Patient has met program and personal goals. ? ?Smoking History:  ?Social History  ? ?Tobacco Use  ?Smoking Status Former  ? Packs/day: 2.00  ? Years: 30.00  ? Pack years: 60.00  ? Types: Cigarettes  ? Start date: 06/03/1961  ? Quit date: 11/17/1991  ? Years since quitting: 29.5  ?Smokeless Tobacco Former  ? Types: Snuff, Chew  ? ? ?Diagnosis:  ?S/P coronary artery stent placement ? ?ADL UCSD: ? ? ?Initial Exercise Prescription: ? Initial Exercise Prescription - 03/07/21 1300   ? ?  ? Date of Initial Exercise RX and Referring Provider  ? Date 03/07/21   ? Referring Provider Dr. Domenic Polite   ? Expected Discharge Date 05/31/21   ?  ? NuStep  ? Level 1   ? SPM 60   ? Minutes 39   ?  ? Prescription Details  ? Frequency (times per week) 3   ? Duration Progress to 30 minutes of continuous aerobic without signs/symptoms of physical distress   ?  ? Intensity  ? THRR 40-80% of Max Heartrate 56-113   ? Ratings of Perceived Exertion 11-13   ? Perceived Dyspnea 0-4   ?  ? Resistance Training  ? Training Prescription Yes   ? Weight 3 lbs   ? Reps 10-15   ? ?  ?  ? ?  ? ? ?Discharge Exercise Prescription (Final Exercise Prescription Changes): ? Exercise Prescription Changes - 05/13/21 1400   ? ?  ? Response to Exercise  ? Blood Pressure (Admit) 122/48   ? Blood Pressure (Exercise) 122/50   ? Blood Pressure (Exit) 96/46   ? Heart Rate (Admit) 60 bpm   ? Heart Rate (Exercise) 72 bpm   ? Heart Rate (Exit) 63 bpm   ? Rating of Perceived Exertion (Exercise) 12   ? Duration Continue with 30 min of aerobic exercise without  signs/symptoms of physical distress.   ? Intensity THRR unchanged   ?  ? Progression  ? Progression Continue to progress workloads to maintain intensity without signs/symptoms of physical distress.   ?  ? Resistance Training  ? Training Prescription Yes   ? Weight 3   ? Reps 10-15   ? Time 10 Minutes   ?  ? NuStep  ? Level 1   ? SPM 75   ? Minutes 22   ? METs 1.78   ?  ? Arm Ergometer  ? Level 1   ? Minutes 17   ? METs 1.97   ? ?  ?  ? ?  ? ? ?Functional Capacity: ? 6 Minute Walk   ? ? [place] Name 03/07/21 1327 05/31/21 1128  ?  ?  ? 6 Minute Walk  ? Phase Initial Discharge   ? Distance 600 feet 725 feet   ? Distance % Change -- 20.8 %   ? Distance Feet Change -- 125 ft   ? Walk Time 6 minutes 6 minutes   ? # of Rest Breaks 3 3   ? MPH 1.14 1.37   ? METS 1.13 1.42   ? RPE 15 15   ?  VO2 Peak 3.96 4.97   ? Symptoms Yes (comment) Yes (comment)   ? Comments Claudication 9/10 caused 3 standing breaks for 150 sec, 30 sec, and 30 sec claudication 10/10 caused 2 standing rest breaks for 10 and 20 sec and a seated break for 45 sec   ? Resting HR 57 bpm 56 bpm   ? Resting BP 130/60 128/50   ? Resting Oxygen Saturation  97 % 97 %   ? Exercise Oxygen Saturation  during 6 min walk 96 % 94 %   ? Max Ex. HR 79 bpm 77 bpm   ? Max Ex. BP 138/60 150/52   ? 2 Minute Post BP 120/64 120/46   ? ?  ?  ? ?  ? ? ?Psychological, QOL, Others - Outcomes: ?PHQ 2/9: ?Depression screen [facility] 2/9 06/12/2021 03/07/2021 10/03/2020 07/04/2020 01/26/2020  ?Decreased Interest 0 3 0 0 0  ?Down, Depressed, Hopeless 0 0 0 0 0  ?PHQ - 2 Score 0 3 0 0 0  ?Altered sleeping 1 0 0 0 -  ?Tired, decreased energy 1 3 0 0 -  ?Change in appetite 0 0 0 0 -  ?Feeling bad or failure about yourself  0 0 0 0 -  ?Trouble concentrating 0 0 0 0 -  ?Moving slowly or fidgety/restless 0 0 0 0 -  ?Suicidal thoughts 0 0 0 0 -  ?PHQ-9 Score 2 6 0 0 -  ?Difficult doing work/chores Not difficult at all Somewhat difficult Not difficult at all Not difficult at all -  ?Some recent data might be  hidden  ? ? ?Quality of Life: ? Quality of Life - 05/29/21 1551   ? ?  ? Quality of Life Scores  ? Health/Function Pre 19.14 %   ? Health/Function Post 19.85 %   ? Health/Function % Change 3.71 %   ? Socioeconomic Pre 19.92 %   ? Socioeconomic Post 16.8 %   ? Socioeconomic % Change  -15.66 %   ? Psych/Spiritual Pre 22.5 %   ? Psych/Spiritual Post 24 %   ? Psych/Spiritual % Change 6.67 %   ? Family Pre 24 %   ? Family Post 12 %   ? Family % Change -50 %   ? GLOBAL Pre 20.68 %   ? GLOBAL Post 19.24 %   ? GLOBAL % Change -6.96 %   ? ?  ?  ? ?  ? ? ?Personal Goals: ?Goals established at orientation with interventions provided to work toward goal. ? Personal Goals and Risk Factors at Admission - 03/07/21 1355   ? ?  ? Core Components/Risk Factors/Patient Goals on Admission  ?  Weight Management Obesity   ? Improve shortness of breath with ADL's Yes   ? Intervention Provide education, individualized exercise plan and daily activity instruction to help decrease symptoms of SOB with activities of daily living.   ? Expected Outcomes Short Term: Improve cardiorespiratory fitness to achieve a reduction of symptoms when performing ADLs;Long Term: Be able to perform more ADLs without symptoms or delay the onset of symptoms   ? Hypertension Yes   ? Intervention Monitor prescription use compliance.;Provide education on lifestyle modifcations including regular physical activity/exercise, weight management, moderate sodium restriction and increased consumption of fresh fruit, vegetables, and low fat dairy, alcohol moderation, and smoking cessation.   ? Expected Outcomes Long Term: Maintenance of blood pressure at goal levels.   ? Lipids Yes   ? Intervention Provide education and support for participant on  nutrition & aerobic/resistive exercise along with prescribed medications to achieve LDL <36m, HDL >429m   ? Expected Outcomes Long Term: Cholesterol controlled with medications as prescribed, with individualized exercise RX and  with personalized nutrition plan. Value goals: LDL < 7084mHDL > 40 mg.   ? Personal Goal Other Yes   ? Personal Goal Patient wants to get stronger; have more energy and be able to do his ADL's   ? Intervention Paitent will attend the CR program with exercise and education for lifestyle modification.   ? Expected Outcomes Patient will complete the program meeting both program and personal goals.   ? ?  ?  ? ?  ?  ? ?Personal Goals Discharge: ? Goals and Risk Factor Review   ? ? [place] 03/20/21 1353 04/22/21 [id number]/22/23 0907 06/12/21 0753  ?  ?  ? Core Components/Risk Factors/Patient Goals Review  ? Personal Goals Review Hypertension;Lipids;Improve shortness of breath with ADL's;Other Hypertension;Lipids;Improve shortness of breath with ADL's;Other Hypertension;Lipids;Improve shortness of breath with ADL's;Other Hypertension;Lipids;Improve shortness of breath with ADL's;Other   ? Review Patient was referred to CR with Stent placment. He has multiple risk factors for CAD and is participating in the program for risk modification. He is new to the program completing 5 sessions losing with his initial weight of 156.0 and his current weight of 155.5. He does have to stop multiple time during exercise due to SOB, lower extremity pain and fatigue. His personal goals for the program are to increase his strength and energy and be able to do his ADL's. We will continues to monitor his progress as he works towards meeting these goals. Patient has completed 17 session with current with 156 lbs. He continues to do well in the program with consistent attendance. His progression has been impeded by his SOB and claudication secondary to PAD. He frequently has to stop during exercise but is stopping less now. He blood pressure is usually at goal but is sometimes elevated initially. He says he feels like the program is helping him. He saw his pcp 04/11/21 for routine check and he complained of fatigue and SOB to her. No changes  were made in his medication regimen. His personal goals continue to be to increase his strenght and energy and be able to do his ADL's. We will continue to monitor his progress as he works towards meeting these

## 2021-07-03 ENCOUNTER — Ambulatory Visit: Payer: Medicare Other | Admitting: Cardiology

## 2021-07-03 NOTE — Progress Notes (Deleted)
? ? ?Cardiology Office Note ? ?Date: 07/03/2021  ? ?ID: William Bachelor., DOB 1940-09-08, MRN [id number] ? ?PCP:  Ailene Ards, NP  ?Cardiologist:  Rozann Lesches, MD ?Electrophysiologist:  None  ? ?No chief complaint on file. ? ? ?History of Present Illness: ?William Tiley. is an 81 y.o. male last seen in October 2022. ? ?He underwent overlapping DES with orbital atherectomy addressing the proximal to distal RCA in September 2022 with Dr. Angelena Form. ? ?Most recent echocardiogram in November 2022 revealed LVEF 60 to 65% with mild diastolic dysfunction. ? ?Past Medical History:  ?Diagnosis Date  ? Carotid artery disease ([place])   ? Colon polyp   ? COPD (chronic obstructive pulmonary disease) ([place])   ? Coronary artery disease   ? Multivessel status post CABG with LIMA to LAD and SVG to OM1 2017  ? Hyperlipidemia   ? Hypertension   ? 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 Myocardial infarction [facility])   ? Peripheral arterial disease ([place])   ? Suboptimal revascularization options  ? ? ?Past Surgical History:  ?Procedure Laterality Date  ? ABDOMINAL AORTOGRAM W/LOWER EXTREMITY N/A 08/21/2017  ? Procedure: ABDOMINAL AORTOGRAM W/LOWER EXTREMITY;  Surgeon: Angelia Mould, MD;  Location: [place] [facility];  Service: Cardiovascular;  Laterality: N/A;  ? Bilateral renal  artery stenoses  04/23/2009  ? CARDIAC CATHETERIZATION N/A 05/21/2015  ? Procedure: Left Heart Cath and Coronary Angiography;  Surgeon: Lorretta Harp, MD;  Location: [place] [facility];  Service: Cardiovascular;  Laterality: N/A;  ? CORONARY ARTERY BYPASS GRAFT N/A 05/22/2015  ? Procedure: CORONARY ARTERY BYPASS GRAFTING (CABG) LIMA-LAD and SVG-OM EVH RIGHT THIGH GREATER SAPHENOUS VEIN;  Surgeon: [name], MD;  Location: [place];  Service: Open Heart Surgery;  Laterality: N/A;  ? CORONARY ATHERECTOMY N/A 12/21/2020  ? Procedure: CORONARY ATHERECTOMY;  Surgeon: Burnell Blanks, MD;  Location: [place] [facility];  Service: Cardiovascular;   Laterality: N/A;  ? CORONARY BALLOON ANGIOPLASTY N/A 12/06/2020  ? Procedure: CORONARY BALLOON ANGIOPLASTY;  Surgeon: Burnell Blanks, MD;  Location: [place] [facility];  Service: Cardiovascular;  Laterality: N/A;  ? CORONARY STENT INTERVENTION N/A 12/21/2020  ? Procedure: CORONARY STENT INTERVENTION;  Surgeon: Burnell Blanks, MD;  Location: [place] [facility];  Service: Cardiovascular;  Laterality: N/A;  ? HIATAL HERNIA REPAIR    ? INTRAVASCULAR ULTRASOUND/IVUS N/A 12/21/2020  ? Procedure: Intravascular Ultrasound/IVUS;  Surgeon: Burnell Blanks, MD;  Location: [place] [facility];  Service: Cardiovascular;  Laterality: N/A;  ? LEFT HEART CATH AND CORS/GRAFTS ANGIOGRAPHY N/A 12/06/2020  ? Procedure: LEFT HEART CATH AND CORS/GRAFTS ANGIOGRAPHY;  Surgeon: Burnell Blanks, MD;  Location: [place] [facility];  Service: Cardiovascular;  Laterality: N/A;  ? Percutaneous translumnial angioplasty  04/23/2009  ? Catheterization of Lefst external iliac artery with Left lower extremity runoff  ? TEE WITHOUT CARDIOVERSION N/A 05/22/2015  ? Procedure: TRANSESOPHAGEAL ECHOCARDIOGRAM (TEE);  Surgeon: [name], MD;  Location: [place];  Service: Open Heart Surgery;  Laterality: N/A;  ? ? ?Current Outpatient Medications  ?Medication Sig Dispense Refill  ? amLODipine (NORVASC) 10 MG tablet Take 1 tablet (10 mg total) by mouth daily. 90 tablet 3  ? Ascorbic Acid (VITAMIN C) 1000 MG tablet Take 1,000 mg by mouth in the morning.    ? aspirin EC 81 MG tablet Take 1 tablet (81 mg total) by mouth daily.    ? cholecalciferol (VITAMIN D) 25 MCG (1000 UNIT)  tablet Take 1,000 Units by mouth in the morning.    ? ezetimibe (ZETIA) 10 MG tablet Take 1 tablet (10 mg total) by mouth daily. 30 tablet 11  ? famotidine (PEPCID) 20 MG tablet Take 20 mg by mouth every evening.    ? fish oil-omega-3 fatty acids 1000 MG capsule Take 1,000 mg by mouth in the morning.    ? folic acid (FOLVITE) 323 MCG tablet Take 400 mcg by  mouth in the morning.    ? Garlic 5573 MG TBEC Take 2,000 mg by mouth in the morning.    ? isosorbide mononitrate (IMDUR) 60 MG 24 hr tablet Take 1 tablet (60 mg total) by mouth daily. 90 tablet 3  ? metoprolol tartrate (LOPRESSOR) 50 MG tablet Take 1 tablet (50 mg total) by mouth 2 (two) times daily. 180 tablet 3  ? Multiple Vitamin (MULTIVITAMIN WITH MINERALS) TABS tablet Take 1 tablet by mouth in the morning.    ? nitroGLYCERIN (NITROSTAT) 0.4 MG SL tablet Place 1 tablet (0.4 mg total) under the tongue every 5 (five) minutes as needed for chest pain. If pain does not resolve with rest. 50 tablet 3  ? olmesartan (BENICAR) 40 MG tablet Take 1 tablet (40 mg total) by mouth daily. 30 tablet 11  ? omega-3 acid ethyl esters (LOVAZA) 1 g capsule Take 1 capsule (1 g total) by mouth daily. 60 capsule 2  ? rosuvastatin (CRESTOR) 10 MG tablet TAKE 1 TABLET BY MOUTH EVERY DAY N 90 tablet 3  ? thiamine (VITAMIN B-1) 100 MG tablet Take 100 mg by mouth every evening.     ? thyroid (NP THYROID) 30 MG tablet Take 1 tablet (30 mg total) by mouth daily before breakfast. 90 tablet 1  ? ticagrelor (BRILINTA) 90 MG TABS tablet Take 1 tablet (90 mg total) by mouth 2 (two) times daily. 60 tablet 11  ? ?No current facility-administered medications for this visit.  ? ?Allergies:  Patient has no known allergies.  ? ?Social History: The patient  reports that he quit smoking about 29 years ago. His smoking use included cigarettes. He started smoking about 60 years ago. He has a 60.00 pack-year smoking history. He has quit using smokeless tobacco.  His smokeless tobacco use included snuff and chew. He reports that he does not currently use alcohol after a past usage of about 2.0 - 4.0 standard drinks per week. He reports that he does not use drugs.  ? ?Family History: The patient's family history includes Cancer in his mother; Deep vein thrombosis in his father; Heart disease in his father; Pulmonary embolism in his father.  ? ?ROS:  Please  see the history of present illness. Otherwise, complete review of systems is positive for {NONE DEFAULTED:18576}.  All other systems are reviewed and negative.  ? ?Physical Exam: ?VS:  There were no vitals taken for this visit., BMI There is no height or weight on file to calculate BMI. ? ?Wt Readings from Last 3 Encounters:  ?05/31/21 154 lb 5.2 oz (70 kg)  ?05/24/21 158 lb (71.7 kg)  ?05/13/21 154 lb 1.6 oz (69.9 kg)  ?  ?General: Patient appears comfortable at rest. ?HEENT: Conjunctiva and lids normal, oropharynx clear with moist mucosa. ?Neck: Supple, no elevated JVP or carotid bruits, no thyromegaly. ?Lungs: Clear to auscultation, nonlabored breathing at rest. ?Cardiac: Regular rate and rhythm, no S3 or significant systolic murmur, no pericardial rub. ?Abdomen: Soft, nontender, no hepatomegaly, bowel sounds present, no guarding or rebound. ?Extremities: No pitting  edema, distal pulses 2+. ?Skin: Warm and dry. ?Musculoskeletal: No kyphosis. ?Neuropsychiatric: Alert and oriented x3, affect grossly appropriate. ? ?ECG:  An ECG dated 12/21/2020 was personally reviewed today and demonstrated:  Sinus rhythm. ? ?Recent Labwork: ?04/11/2021: Hemoglobin 13.1; Platelets 297.0 ?05/24/2021: TSH 1.73 ?05/31/2021: ALT 23; AST 27; BUN 40; Creatinine, Ser 1.38; Potassium 5.0; Sodium 138  ?   ?Component Value Date/Time  ? CHOL 96 12/22/2020 0150  ? TRIG 130 12/22/2020 0150  ? HDL 31 (L) 12/22/2020 0150  ? CHOLHDL 3.1 12/22/2020 0150  ? VLDL 26 12/22/2020 0150  ? LDLCALC 39 12/22/2020 0150  ? [place] 81 07/04/2020 1018  ? ? ?Other Studies Reviewed Today: ? ?Cardiac catheterization 12/06/2020: ?  Dist RCA lesion is 70% stenosed. ?  Ost LM to Mid LM lesion is 99% stenosed. ?  Ost Cx to Prox Cx lesion is 60% stenosed. ?  Origin to Prox Graft lesion is 100% stenosed. ?  Prox LAD lesion is 100% stenosed. ?  Mid RCA-1 lesion is 99% stenosed. ?  Mid RCA-2 lesion is 99% stenosed. ?  Scoring balloon angioplasty was performed using a BALLOON  SCOREFLEX 3.0X15. ?  Post intervention, there is a 80% residual stenosis. ?  Post intervention, there is a 30% residual stenosis. ?  SVG graft was visualized by angiography. ?  LIMA graft was not visualized. ?  Terie Purser

## 2021-07-11 ENCOUNTER — Ambulatory Visit: Payer: Medicare Other | Admitting: Nurse Practitioner

## 2021-07-19 DIAGNOSIS — Z20822 Contact with and (suspected) exposure to covid-19: Secondary | ICD-10-CM | POA: Diagnosis not present

## 2021-08-03 DIAGNOSIS — Z20822 Contact with and (suspected) exposure to covid-19: Secondary | ICD-10-CM | POA: Diagnosis not present

## 2021-08-05 DIAGNOSIS — Z20822 Contact with and (suspected) exposure to covid-19: Secondary | ICD-10-CM | POA: Diagnosis not present

## 2021-08-19 ENCOUNTER — Other Ambulatory Visit: Payer: Self-pay | Admitting: Internal Medicine

## 2021-08-23 ENCOUNTER — Telehealth: Payer: Self-pay

## 2021-08-23 ENCOUNTER — Encounter: Payer: Self-pay | Admitting: Nurse Practitioner

## 2021-08-23 ENCOUNTER — Ambulatory Visit (INDEPENDENT_AMBULATORY_CARE_PROVIDER_SITE_OTHER): Payer: Medicare Other | Admitting: Nurse Practitioner

## 2021-08-23 VITALS — BP 140/60 | HR 66 | Temp 97.6°F | Ht 66.0 in | Wt 157.0 lb

## 2021-08-23 DIAGNOSIS — R2689 Other abnormalities of gait and mobility: Secondary | ICD-10-CM | POA: Insufficient documentation

## 2021-08-23 DIAGNOSIS — L602 Onychogryphosis: Secondary | ICD-10-CM | POA: Diagnosis not present

## 2021-08-23 DIAGNOSIS — K219 Gastro-esophageal reflux disease without esophagitis: Secondary | ICD-10-CM | POA: Diagnosis not present

## 2021-08-23 DIAGNOSIS — J439 Emphysema, unspecified: Secondary | ICD-10-CM | POA: Diagnosis not present

## 2021-08-23 DIAGNOSIS — I739 Peripheral vascular disease, unspecified: Secondary | ICD-10-CM | POA: Diagnosis not present

## 2021-08-23 MED ORDER — FLUTICASONE PROPIONATE 50 MCG/ACT NA SUSP: 2.0000 | Freq: Every day | NASAL | 6 refills | Status: DC

## 2021-08-23 MED ORDER — ROSUVASTATIN CALCIUM 10 MG PO TABS: ORAL_TABLET | ORAL | 3 refills | Status: DC

## 2021-08-23 MED ORDER — DOXYCYCLINE HYCLATE 100 MG PO TABS: 100.0000 mg | ORAL_TABLET | Freq: Two times a day (BID) | ORAL | 0 refills | Status: DC

## 2021-08-23 NOTE — Assessment & Plan Note (Signed)
Chronic, referral to physical therapy to assist with walking regimen and exercise program to help with intermittent claudication made.  He is encouraged to continue taking his chronic medications as currently prescribed (last LDL 39) including Zetia 10 mg/day omega-3 fish oil 1000 mg/day Imdur 60 mg/day rosuvastatin 10 mg/day and Brilinta 90 mg twice a day

## 2021-08-23 NOTE — Telephone Encounter (Signed)
Medication refill request for Crestor 10 mg tablets approved and sent to [facility].

## 2021-08-23 NOTE — Assessment & Plan Note (Signed)
Chronic, neurologic exam intact today.  Referral to physical therapy made today to assist him with improving strength and balance.

## 2021-08-23 NOTE — Assessment & Plan Note (Signed)
Chronic, on exam nails are thick and he may have fungal disease.  Referral to podiatry made today to assist with managing his thickened toenails.

## 2021-08-23 NOTE — Assessment & Plan Note (Signed)
Seems to have resolved.  Consider further evaluation if symptoms return.

## 2021-08-23 NOTE — Progress Notes (Signed)
Established Patient Office Visit  Subjective   Patient ID: William [name]., male    DOB: 1940-10-29  Age: 81 y.o. MRN: [id number]  Chief Complaint  Patient presents with   Follow-up   Patient seen today for follow-up regarding fatigue and GERD.  Fatigue: Overall he reports feeling stable.  He has been experiencing fatigue and reduced exercise tolerance since he had his cardiac catheterization with stent placement approximately 8 months ago.  He also has noticed increased coughing and sputum production over the last few months.  He is on NP thyroid for the off label treatment of his fatigue and is tolerating this well.  He continues to follow-up with pulmonology and cardiology.  He reports that he was told he does not have COPD, however per chart review it does appear he may have COPD. He has been on respiratory inhalers in the past without much improvement in his symptoms.  He has been participating with cardiac rehab without much improvement in his exercise tolerance.  GERD: Last office visit he decided to stop taking famotidine.  He is also taken Prilosec in the past, he is currently not on this either.  Since stopping both medications he reports his heartburn has not returned.  He also mentions that he has thick toenails would like this to be evaluated.  He also reports that he feels that he is somewhat off balance.  He would like to be evaluated by physical therapy and/or start a therapy regimen to assist him with his balance.  He denies any recent falls.  He does have history of double vision which has resolved and has not yet returned.  Past Medical History:  Diagnosis Date   Carotid artery disease ([facility])    Colon polyp    COPD (chronic obstructive pulmonary disease) ([place])    Coronary artery disease    Multivessel status post CABG with LIMA to LAD and SVG to OM1 2017   Hyperlipidemia    Hypertension    Iliac artery aneurysm ([facility])    Myocardial infarction ([place])    Peripheral  arterial disease ([facility])    Suboptimal revascularization options      Review of Systems  Constitutional:  Positive for malaise/fatigue.  Eyes:  Negative for blurred vision.  Respiratory:  Positive for cough, sputum production and shortness of breath.   Cardiovascular:  Positive for claudication. Negative for chest pain.  Gastrointestinal:  Negative for abdominal pain, blood in stool, constipation and heartburn.  Neurological:  Positive for dizziness. Negative for headaches.     Objective:     BP 140/60 (BP Location: Left Arm, Patient Position: Sitting, Cuff Size: Normal)   Pulse 66   Temp 97.6 F (36.4 C) (Oral)   Ht '5\' 6"'$  (1.676 m)   Wt 157 lb (71.2 kg)   SpO2 97%   BMI 25.34 kg/m  BP Readings from Last 3 Encounters:  08/23/21 140/60  05/24/21 (!) 138/50  04/11/21 140/66   Wt Readings from Last 3 Encounters:  08/23/21 157 lb (71.2 kg)  05/31/21 154 lb 5.2 oz (70 kg)  05/24/21 158 lb (71.7 kg)      Physical Exam Vitals reviewed.  Constitutional:      General: He is not in acute distress.    Appearance: Normal appearance. He is not ill-appearing.  HENT:     Head: Normocephalic and atraumatic.     Right Ear: Tympanic membrane, ear canal and external ear normal.     Left Ear: Tympanic membrane, ear  canal and external ear normal.  Eyes:     General: No scleral icterus.    Extraocular Movements: Extraocular movements intact.     Conjunctiva/sclera: Conjunctivae normal.     Pupils: Pupils are equal, round, and reactive to light.  Neck:     Vascular: No carotid bruit.  Cardiovascular:     Rate and Rhythm: Normal rate and regular rhythm.     Pulses:          Dorsalis pedis pulses are 0 on the left side.     Heart sounds: Normal heart sounds.  Pulmonary:     Effort: Pulmonary effort is normal.     Breath sounds: Decreased breath sounds present.  Abdominal:     General: Bowel sounds are normal. There is no distension.     Palpations: There is no mass.      Tenderness: There is no abdominal tenderness.     Hernia: No hernia is present.  Musculoskeletal:        General: No swelling or tenderness.     Cervical back: Normal range of motion and neck supple. No rigidity.     Right foot: No deformity.     Left foot: No deformity.  Feet:     Right foot:     Toenail Condition: Right toenails are normal.     Left foot:     Skin integrity: Skin integrity normal.     Toenail Condition: Left toenails are abnormally thick. Fungal disease present.    Comments: Pulses not palpable, no doppler available to check pulse. Capillary refill <3 seconds to toes Lymphadenopathy:     Cervical: No cervical adenopathy.  Skin:    General: Skin is warm and dry.  Neurological:     General: No focal deficit present.     Mental Status: He is alert and oriented to person, place, and time.     Cranial Nerves: No cranial nerve deficit.     Sensory: No sensory deficit.     Motor: No weakness.     Coordination: Coordination normal.     Gait: Gait normal.  Psychiatric:        Mood and Affect: Mood normal.        Behavior: Behavior normal.        Judgment: Judgment normal.     No results found for any visits on 08/23/21.  Last CBC Lab Results  Component Value Date   WBC 8.9 04/11/2021   HGB 13.1 04/11/2021   HCT 39.4 04/11/2021   MCV 93.2 04/11/2021   MCH 31.3 12/22/2020   RDW 13.8 04/11/2021   PLT 297.0 16/38/4536   Last metabolic panel Lab Results  Component Value Date   GLUCOSE 91 05/31/2021   NA 138 05/31/2021   K 5.0 05/31/2021   CL 110 05/31/2021   CO2 20 (L) 05/31/2021   BUN 40 (H) 05/31/2021   CREATININE 1.38 (H) 05/31/2021   GFRNONAA 52 (L) 05/31/2021   CALCIUM 8.9 05/31/2021   PROT 7.0 05/31/2021   ALBUMIN 3.7 05/31/2021   BILITOT 0.5 05/31/2021   ALKPHOS 43 05/31/2021   AST 27 05/31/2021   ALT 23 05/31/2021   ANIONGAP 8 05/31/2021   Last lipids Lab Results  Component Value Date   CHOL 96 12/22/2020   HDL 31 (L) 12/22/2020    LDLCALC 39 12/22/2020   TRIG 130 12/22/2020   CHOLHDL 3.1 12/22/2020   Last thyroid functions Lab Results  Component Value Date   TSH 1.73 05/24/2021  The ASCVD Risk score (Arnett DK, et al., 2019) failed to calculate for the following reasons:   The 2019 ASCVD risk score is only valid for ages 39 to 55   The patient has a prior MI or stroke diagnosis    Assessment & Plan:   Problem List Items Addressed This Visit       Respiratory   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    Ailene Ards, NP

## 2021-08-23 NOTE — Assessment & Plan Note (Addendum)
Chronic, possible acute COPD exacerbation.  Prescription for doxycycline 100 mg by mouth twice a day prescribed for patient, he was also encouraged to use Flonase until symptoms improve.  I think his COPD may also be related to his fatigue (fatigue probably multifactorial including coronary artery disease), I recommend he follow-up with pulmonologist as scheduled.  We do not have any respiratory inhaler samples to provide patient today to see if this would help with his symptoms.  He is already tried Spiriva as well as Stiolto in the past without improvement in symptoms.

## 2021-08-23 NOTE — Patient Instructions (Signed)
William [name]

## 2021-09-06 ENCOUNTER — Ambulatory Visit (INDEPENDENT_AMBULATORY_CARE_PROVIDER_SITE_OTHER): Payer: Medicare Other | Admitting: Podiatry

## 2021-09-06 DIAGNOSIS — I25118 Atherosclerotic heart disease of native coronary artery with other forms of angina pectoris: Secondary | ICD-10-CM | POA: Diagnosis not present

## 2021-09-06 DIAGNOSIS — I999 Unspecified disorder of circulatory system: Secondary | ICD-10-CM

## 2021-09-06 DIAGNOSIS — M79675 Pain in left toe(s): Secondary | ICD-10-CM | POA: Diagnosis not present

## 2021-09-06 DIAGNOSIS — M79674 Pain in right toe(s): Secondary | ICD-10-CM | POA: Diagnosis not present

## 2021-09-06 DIAGNOSIS — B351 Tinea unguium: Secondary | ICD-10-CM | POA: Diagnosis not present

## 2021-09-06 NOTE — Progress Notes (Signed)
Subjective:   Patient ID: William [name]., male   DOB: 81 y.o.   MRN: [id number]   HPI Patient presents with severe nail disease and elongation 1-5 both feet with the patient who does have significant peripheral arterial disease he is aware of.  Does not currently smoke is not active   Review of Systems  All other systems reviewed and are negative.       Objective:  Physical Exam Vitals and nursing note reviewed.  Constitutional:      Appearance: He is well-developed.  Pulmonary:     Effort: Pulmonary effort is normal.  Musculoskeletal:        General: Normal range of motion.  Skin:    General: Skin is warm.  Neurological:     Mental Status: He is alert.     Vascular status found to be dramatically reduced DP and PT pulses with patient having redness in his forefoot but no ulcerations no breakdown and no other pathology with patient having already had this checked.  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

## 2021-09-12 ENCOUNTER — Other Ambulatory Visit: Payer: Self-pay | Admitting: *Deleted

## 2021-09-12 MED ORDER — AMLODIPINE BESYLATE 10 MG PO TABS: 10.0000 mg | ORAL_TABLET | Freq: Every day | ORAL | 3 refills | Status: DC

## 2021-09-20 ENCOUNTER — Ambulatory Visit (INDEPENDENT_AMBULATORY_CARE_PROVIDER_SITE_OTHER): Payer: Medicare Other | Admitting: Internal Medicine

## 2021-09-20 ENCOUNTER — Encounter: Payer: Self-pay | Admitting: Internal Medicine

## 2021-09-20 ENCOUNTER — Ambulatory Visit (HOSPITAL_COMMUNITY)
Admission: RE | Admit: 2021-09-20 | Discharge: 2021-09-20 | Disposition: A | Discharge status: Home / Self Care | Payer: Medicare Other | Source: Ambulatory Visit | Attending: Internal Medicine | Admitting: Internal Medicine

## 2021-09-20 DIAGNOSIS — R042 Hemoptysis: Secondary | ICD-10-CM | POA: Diagnosis not present

## 2021-09-20 DIAGNOSIS — I25118 Atherosclerotic heart disease of native coronary artery with other forms of angina pectoris: Secondary | ICD-10-CM | POA: Diagnosis not present

## 2021-09-20 DIAGNOSIS — R0602 Shortness of breath: Secondary | ICD-10-CM | POA: Diagnosis not present

## 2021-09-20 DIAGNOSIS — R058 Other specified cough: Secondary | ICD-10-CM

## 2021-09-20 DIAGNOSIS — J439 Emphysema, unspecified: Secondary | ICD-10-CM | POA: Insufficient documentation

## 2021-09-20 MED ORDER — STIOLTO RESPIMAT 2.5-2.5 MCG/ACT IN AERS
2.0000 | INHALATION_SPRAY | Freq: Every day | RESPIRATORY_TRACT | 0 refills | Status: DC
Start: 2021-09-20 — End: 2021-11-01

## 2021-09-20 MED ORDER — STIOLTO RESPIMAT 2.5-2.5 MCG/ACT IN AERS: 2.0000 | INHALATION_SPRAY | Freq: Every day | RESPIRATORY_TRACT | 11 refills | Status: DC

## 2021-09-20 NOTE — Progress Notes (Signed)
[name], male    DOB: 1940-06-26,    MRN: [id number]   Brief patient profile:  81yowm quit smoking 1993  with GOLD 2 COPD  criteria 05/2015 while on spiriva with reported doe = MMRC 1  but did not  think the spiriva dpi is working as well and also limited by claudication and sometimes ex cp with known IHD so referred to pulmonary clinic in [place]  10/11/2020 by Dr    Diona Browner     History of Present Illness  10/11/2020  Pulmonary/ 1st office eval/ Kayliee Atienza / [facility]  Chief Complaint  Patient presents with   Consult    Patient has COPD and patient states that he has been having trouble with his heart and was sent here to make sure his lungs are ok. Wife died of lung cancer. Shortness of breath with exertion. Takes Spiriva  Dyspnea:  legs usually stop him 1st x 100 ft  flat and slow    Cough: not much Sleep: flat bed/ one pillow  SABA use: none  Rec Stop spiriva and start Stiolto 2 puffs first thing in Am x 2 weeks then try 1 puff daily x 1 weeks then try nothing to see if it really make a difference Work on inhaler technique: If worse chest pains with activity on the stiolto 2 puffs, can try one puff instead but if it persists, stop stiolto If even the 2 puffs doesn't help your breathing , you need a trial off lisinopril 40 and on benicar 40  for at least 4 weeks - call if you want to try it and I'll call it in for you.       03/26/2021  f/u ov/[place] office/Tesa Meadors re: gold 2 COPD on no maint rx   Chief Complaint  Patient presents with   Follow-up    Feels SOB is about the same last visit.   Pt started cardiac rehab is hoping it will help his SOB.   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[employer]  Cough: sensation of throat tickling, not really a cough Sleeping: on cpap/flat bed / one pillow SABA use: none  02: none  Covid status: vax x 3  Occ dysphagia, has had egd in past   Rec  Try prilosec otc 20mg   Take 30-60 min  before first meal of the day and Pepcid ac (famotidine) 20 mg one @  bedtime until cough is completely gone for at least a week without the need for cough suppression GERD diet reviewed, bed blocks rec           09/20/2021  f/u ov/[place] office/Orestes Geiman re: GOLD 2 / ?PF  maint on no rx  Chief Complaint  Patient presents with   Follow-up    SOB is about the same since last ov.   Dyspnea:  says breathing not been right since stents despite rehab  Cough: mostly throat clearing  Sleeping: cpap at hs / once a week wakes up slt bloody sputum  SABA use: none  02: none  Covid status: "all of em"     No obvious day to day or daytime variability or assoc excess/ purulent sputum or mucus plugs or  cp or chest tightness, subjective wheeze or overt sinus or hb symptoms.   Sleeping  without nocturnal  or early am exacerbation  of respiratory  c/o's or need for noct saba. Also denies any obvious fluctuation of symptoms with  weather or environmental changes or other aggravating or alleviating factors except as outlined above   No unusual exposure hx or h/o childhood pna/ asthma or knowledge of premature birth.  Current Allergies, Complete Past Medical History, Past Surgical History, Family History, and Social History were reviewed in [employer] record.  ROS  The following are not active complaints unless bolded Hoarseness, sore throat, dysphagia/ globus sensation , dental problems, itching, sneezing,  nasal congestion or discharge of excess mucus or purulent secretions, ear ache,   fever, chills, sweats, unintended wt loss or wt gain, classically pleuritic or exertional cp,  orthopnea pnd or arm/hand swelling  or leg swelling, presyncope, palpitations, abdominal pain, anorexia, nausea, vomiting, diarrhea  or change in bowel habits or change in bladder habits, change in stools or change in urine, dysuria, hematuria,  rash, arthralgias, visual complaints, headache, numbness,  weakness or ataxia or problems with walking or coordination,  change in mood or  memory.        Current Meds  Medication Sig   amLODipine (NORVASC) 10 MG tablet Take 1 tablet (10 mg total) by mouth daily.   Ascorbic Acid (VITAMIN C) 1000 MG tablet Take 1,000 mg by mouth in the morning.   aspirin EC 81 MG tablet Take 1 tablet (81 mg total) by mouth daily.   cholecalciferol (VITAMIN D) 25 MCG (1000 UNIT) tablet Take 1,000 Units by mouth in the morning.   doxycycline (VIBRA-TABS) 100 MG tablet Take 1 tablet (100 mg total) by mouth 2 (two) times daily.   ezetimibe (ZETIA) 10 MG tablet Take 1 tablet (10 mg total) by mouth daily.   famotidine (PEPCID) 20 MG tablet Take 20 mg by mouth every evening.   fish oil-omega-3 fatty acids 1000 MG capsule Take 1,000 mg by mouth in the morning.   fluticasone (FLONASE) 50 MCG/ACT nasal spray Place 2 sprays into both nostrils daily.   folic acid (FOLVITE) 400 MCG tablet Take 400 mcg by mouth in the morning.   Garlic 2000 MG TBEC Take 2,000 mg by mouth in the morning.   isosorbide mononitrate (IMDUR) 60 MG 24 hr tablet Take 1 tablet (60 mg total) by mouth daily.   metoprolol tartrate (LOPRESSOR) 50 MG tablet Take 1 tablet (50 mg total) by mouth 2 (two) times daily.   Multiple Vitamin (MULTIVITAMIN WITH MINERALS) TABS tablet Take 1 tablet by mouth in the morning.   nitroGLYCERIN (NITROSTAT) 0.4 MG SL tablet Place 1 tablet (0.4 mg total) under the tongue every 5 (five) minutes as needed for chest pain. If pain does not resolve with rest.   olmesartan (BENICAR) 40 MG tablet TAKE 1 TABLET BY MOUTH EVERY DAY   omega-3 acid ethyl esters (LOVAZA) 1 g capsule Take 1 capsule (1 g total) by mouth daily.   rosuvastatin (CRESTOR) 10 MG tablet TAKE 1 TABLET BY MOUTH EVERY DAY N   thiamine (VITAMIN B-1) 100 MG tablet Take 100 mg by mouth every evening.    thyroid (NP THYROID) 30 MG tablet Take 1 tablet (30 mg total) by mouth daily before breakfast.   ticagrelor (BRILINTA) 90  MG TABS tablet Take 1 tablet (90 mg total) by mouth 2 (two) times daily.            Objective:    Wts   09/20/2021       156  03/26/2021     159   12/20/20 161 lb 1.9 oz (73.1 kg)  12/13/20 162 lb 9.6 oz (73.8 kg)  12/11/20 164 lb (74.4 kg)    Vital signs reviewed  09/20/2021  - Note at rest 02 sats  94% on RA   General appearance:    eldely wm nad / occ throat clearing    HEENT : Oropharynx  clear s cobblestoning or pnd  Nasal turbinates nl   NECK :  without  apparent JVD/ palpable Nodes/TM    LUNGS: no acc muscle use,  Mild/mod kyphonitic/barrel  contour chest wall with bilateral  Distant bs s audible wheeze and  without cough on insp or exp maneuvers  and mild  Hyperresonant  to  percussion bilaterally     CV:  RRR  no s3 or murmur or increase in P2, and no edema   ABD:  soft and nontender with pos end  insp Hoover's  in the supine position.  No bruits or organomegaly appreciated   MS:  Nl gait/ ext warm without deformities Or obvious joint restrictions  calf tenderness, cyanosis or clubbing     SKIN: warm and dry without lesions    NEURO:  alert, approp, nl sensorium with  no motor or cerebellar deficits apparent.     CXR PA and Lateral:   09/20/2021 :    I personally reviewed images and impression is as follows:     Kyphosis/ copd, mild cm no acute findings       Assessment

## 2021-09-21 ENCOUNTER — Encounter: Payer: Self-pay | Admitting: Internal Medicine

## 2021-09-21 DIAGNOSIS — R058 Other specified cough: Secondary | ICD-10-CM | POA: Insufficient documentation

## 2021-10-15 ENCOUNTER — Encounter: Payer: Self-pay | Admitting: Cardiology

## 2021-10-15 ENCOUNTER — Ambulatory Visit (INDEPENDENT_AMBULATORY_CARE_PROVIDER_SITE_OTHER): Payer: Medicare Other | Admitting: Cardiology

## 2021-10-15 VITALS — BP 96/50 | HR 66 | Ht 66.0 in | Wt 150.0 lb

## 2021-10-15 DIAGNOSIS — E782 Mixed hyperlipidemia: Secondary | ICD-10-CM

## 2021-10-15 DIAGNOSIS — I25119 Atherosclerotic heart disease of native coronary artery with unspecified angina pectoris: Secondary | ICD-10-CM | POA: Diagnosis not present

## 2021-10-15 DIAGNOSIS — R0602 Shortness of breath: Secondary | ICD-10-CM | POA: Diagnosis not present

## 2021-10-15 MED ORDER — AMLODIPINE BESYLATE 5 MG PO TABS: 5.0000 mg | ORAL_TABLET | Freq: Every day | ORAL | 1 refills | Status: DC

## 2021-10-15 NOTE — Progress Notes (Signed)
Cardiology Office Note  Date: 10/15/2021   ID: William Bachelor., DOB 04-17-1940, MRN [id number]  PCP:  Ailene Ards, NP  Cardiologist:  Rozann Lesches, MD Electrophysiologist:  None   Chief Complaint  Patient presents with   Cardiac follow-up    History of Present Illness: William Normoyle. is an 81 y.o. male last seen in October 2022 by Mr. Leonides Sake NP.  I reviewed his records.  Cardiac catheterization in September of last year showed severe multivessel disease with associated graft disease and difficult revascularization options.  He was not felt to be an optimal candidate for redo CABG and ultimately underwent placement of 3 overlapping DES with orbital atherectomy addressing the proximal and distal RCA.  He does have severe left main stenosis with occluded LAD, some right to left collaterals, but incompletely visualized LIMA given occlusion of the left subclavian artery.  Circumflex was moderately stenosed.  He presents now reporting generally feeling fatigued and short of breath for several months, no definite angina or nitroglycerin use.  Blood pressure has been running low normal.  He reports compliance with all of his medications.  Echocardiogram in November 2022 revealed LVEF 60 to 65% with mild diastolic dysfunction, normal RV contraction, and no significant valvular abnormalities.  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[facility])    Myocardial infarction ([place])    Peripheral arterial disease ([place])    Suboptimal revascularization options    Past Surgical History:  Procedure Laterality Date   ABDOMINAL AORTOGRAM W/LOWER EXTREMITY N/A 08/21/2017   Procedure: ABDOMINAL AORTOGRAM W/LOWER EXTREMITY;  Surgeon: Angelia Mould, MD;  Location: [facility];  Service: Cardiovascular;  Laterality: N/A;   Bilateral renal  artery stenoses  04/23/2009   CARDIAC CATHETERIZATION N/A 05/21/2015   Procedure: Left Heart Cath and Coronary Angiography;  Surgeon: Lorretta Harp, MD;  Location: [facility];  Service: Cardiovascular;  Laterality: N/A;   CORONARY ARTERY BYPASS GRAFT N/A 05/22/2015   Procedure: CORONARY ARTERY BYPASS GRAFTING (CABG) LIMA-LAD and SVG-OM EVH RIGHT THIGH GREATER SAPHENOUS VEIN;  Surgeon: [name], MD;  Location: [facility];  Service: Open Heart Surgery;  Laterality: N/A;   CORONARY ATHERECTOMY N/A 12/21/2020   Procedure: CORONARY ATHERECTOMY;  Surgeon: Burnell Blanks, MD;  Location: [facility];  Service: Cardiovascular;  Laterality: N/A;   CORONARY BALLOON ANGIOPLASTY N/A 12/06/2020   Procedure: CORONARY BALLOON ANGIOPLASTY;  Surgeon: Burnell Blanks, MD;  Location: [facility];  Service: Cardiovascular;  Laterality: N/A;   CORONARY STENT INTERVENTION N/A 12/21/2020   Procedure: CORONARY STENT INTERVENTION;  Surgeon: Burnell Blanks, MD;  Location: [facility];  Service: Cardiovascular;  Laterality: N/A;   HIATAL HERNIA REPAIR     INTRAVASCULAR ULTRASOUND/IVUS N/A 12/21/2020   Procedure: Intravascular Ultrasound/IVUS;  Surgeon: Burnell Blanks, MD;  Location: [facility];  Service: Cardiovascular;  Laterality: N/A;   LEFT HEART CATH AND CORS/GRAFTS ANGIOGRAPHY N/A 12/06/2020   Procedure: LEFT HEART CATH AND CORS/GRAFTS ANGIOGRAPHY;  Surgeon: Burnell Blanks, MD;  Location: [facility];  Service: Cardiovascular;  Laterality: N/A;   Percutaneous translumnial angioplasty  04/23/2009   Catheterization of [place] external iliac artery with Left lower extremity runoff   TEE WITHOUT CARDIOVERSION N/A 05/22/2015   Procedure: TRANSESOPHAGEAL ECHOCARDIOGRAM (TEE);  Surgeon: [name], MD;  Location: [facility];   Service: Open Heart Surgery;  Laterality: N/A;    Current Outpatient Medications  Medication Sig Dispense Refill   amLODipine (NORVASC) 5 MG tablet Take 1 tablet (5 mg total) by mouth daily. 90 tablet 1   Ascorbic Acid (VITAMIN C) 1000 MG tablet Take 1,000 mg by mouth in the morning.     aspirin EC 81 MG tablet Take 1 tablet (81 mg total) by mouth daily.     cholecalciferol (VITAMIN D) 25 MCG (1000 UNIT) tablet Take 1,000 Units by mouth in the morning.     doxycycline (VIBRA-TABS) 100 MG tablet Take 1 tablet (100 mg total) by mouth 2 (two) times daily. 20 tablet 0   ezetimibe (ZETIA) 10 MG tablet Take 1 tablet (10 mg total) by mouth daily. 30 tablet 11   famotidine (PEPCID) 20 MG tablet Take 20 mg by mouth every evening.     fish oil-omega-3 fatty acids 1000 MG capsule Take 1,000 mg by mouth in the morning.     fluticasone (FLONASE) 50 MCG/ACT nasal spray Place 2 sprays into both nostrils daily. 16 g 6   folic acid (FOLVITE) 390 MCG tablet Take 400 mcg by mouth in the morning.     Garlic 3009 MG TBEC Take 2,000 mg by mouth in the morning.     isosorbide mononitrate (IMDUR) 60 MG 24 hr tablet Take 1 tablet (60 mg total) by mouth daily. 90 tablet 3   metoprolol tartrate (LOPRESSOR) 50 MG tablet Take 1 tablet (50 mg total) by mouth 2 (two) times daily. 180 tablet 3   Multiple Vitamin (MULTIVITAMIN WITH MINERALS) TABS tablet Take 1 tablet by mouth in the morning.     nitroGLYCERIN (NITROSTAT) 0.4 MG SL tablet Place 1 tablet (0.4 mg total) under the tongue every 5 (five) minutes as needed for chest pain. If pain does not resolve with rest. 50 tablet 3   olmesartan (BENICAR) 40 MG tablet TAKE 1 TABLET BY MOUTH EVERY DAY 90 tablet 0   omega-3 acid ethyl esters (LOVAZA) 1 g capsule Take 1 capsule (1 g total) by mouth daily. 60 capsule 2   rosuvastatin (CRESTOR) 10 MG tablet TAKE 1 TABLET BY MOUTH EVERY DAY N 90 tablet 3   thiamine (VITAMIN B-1) 100 MG tablet Take 100 mg by mouth every evening.       thyroid (NP THYROID) 30 MG tablet Take 1 tablet (30 mg total) by mouth daily before breakfast. 90 tablet 1   ticagrelor (BRILINTA) 90 MG TABS tablet Take 1 tablet (90 mg total) by mouth 2 (two) times daily. 60 tablet 11   Tiotropium Bromide-Olodaterol (STIOLTO RESPIMAT) 2.5-2.5 MCG/ACT AERS Inhale 2 puffs into the lungs daily. 1 each 11   Tiotropium Bromide-Olodaterol (STIOLTO RESPIMAT) 2.5-2.5 MCG/ACT AERS Inhale 2 puffs into the lungs daily. 4 g 0   No current facility-administered medications for this visit.   Allergies:  Patient has no known allergies.   ROS: No syncope.  Physical Exam: VS:  BP (!) 96/50 (BP Location: Left Arm, Patient Position: Sitting, Cuff Size: Normal)   Pulse 66   Ht '5\' 6"'$  (1.676 m)   Wt 150 lb (68 kg)   SpO2 97%   BMI 24.21 kg/m , BMI Body mass index is 24.21 kg/m.  Wt Readings from  Last 3 Encounters:  10/15/21 150 lb (68 kg)  09/20/21 156 lb 9.6 oz (71 kg)  08/23/21 157 lb (71.2 kg)    General: Patient appears comfortable at rest. HEENT: Conjunctiva and lids normal, oropharynx clear. Neck: Supple, no elevated JVP or carotid bruits, no thyromegaly. Lungs: Clear to auscultation, nonlabored breathing at rest. Cardiac: Regular rate and rhythm, no S3, 1/6 systolic murmur, no pericardial rub. Abdomen: Soft, bowel sounds present. Extremities: No pitting edema.  ECG:  An ECG dated 12/21/2020 was personally reviewed today and demonstrated:  Sinus rhythm.  Recent Labwork: 04/11/2021: Hemoglobin 13.1; Platelets 297.0 05/24/2021: TSH 1.73 05/31/2021: ALT 23; AST 27; BUN 40; Creatinine, Ser 1.38; Potassium 5.0; Sodium 138     Component Value Date/Time   CHOL 96 12/22/2020 0150   TRIG 130 12/22/2020 0150   HDL 31 (L) 12/22/2020 0150   CHOLHDL 3.1 12/22/2020 0150   VLDL 26 12/22/2020 0150   LDLCALC 39 12/22/2020 0150   LDLCALC 81 07/04/2020 1018    Other Studies Reviewed Today:  Cardiac catheterization 12/06/2020:   Dist RCA lesion is 70% stenosed.   Ost  LM to Mid LM lesion is 99% stenosed.   Ost Cx to Prox Cx lesion is 60% stenosed.   Origin to Prox Graft lesion is 100% stenosed.   Prox LAD lesion is 100% stenosed.   Mid RCA-1 lesion is 99% stenosed.   Mid RCA-2 lesion is 99% stenosed.   Scoring balloon angioplasty was performed using a BALLOON SCOREFLEX 3.0X15.   Post intervention, there is a 80% residual stenosis.   Post intervention, there is a 30% residual stenosis.   SVG graft was visualized by angiography.   LIMA graft was not visualized.   The graft exhibits no disease.   The left ventricular systolic function is normal.   LV end diastolic pressure is normal.   The left ventricular ejection fraction is 55-65% by visual estimate.   There is no mitral valve regurgitation.   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 The distal LAD is seen to fill from right to left collaterals. 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 It is presumed that there is not adequate flow into the LIMA graft given the CTO of the left subclavian artery.  The proximal Circumflex has a moderate stenosis. The vein graft to the obtuse marginal branch is patent.  The RCA is a large dominant artery with serial severe, calcified mid and distal lesions.  Balloon angioplasty and scoring balloon angioplasty of the mid RCA lesions. Unable to deliver stents to the mid RCA lesions despite adequate standard balloon and scoring balloon expansion.  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 Scot Dock. This supplies the LIMA graft which I could not visualize today due to the occlusion of the subclavian artery. I suspect that his RCA disease in the culprit  for his recent angina. Attempted PCI today but will need to bring him back in 2 weeks for orbital atherectomy and stenting of the RCA. I have set this up for 12/21/20 at 7:30 am. I have spoken to the [employer] rep who can be here that day. He will continue on ASA and Brilinta. Monitor overnight on  telemetry. Plan d/c home tomorrow on ASA/Brilinta if stable.   PCI 12/21/2020:   Mid RCA-1 lesion is 80% stenosed.   Mid RCA-2 lesion is 30% stenosed.   Dist RCA lesion is 70% stenosed.   Prox RCA lesion is 65% stenosed.   A stent was successfully placed.   A stent was successfully placed.   A stent was successfully placed.   Post intervention, there is a 0% residual stenosis.   Post intervention, there is a 0% residual stenosis.   Post intervention, there is a 0% residual stenosis.   Post intervention, there is a 0% residual stenosis.    PCI of proximal to distal RCA with three overlapping drug-eluting stents using orbital athrectomy.  A small perforation was seen in the mid vessel that did not improve with prolonged balloon inflation and the patient was stable throughout the entire procedure.  Limited echocardiographic images at the conclusion of the case demonstrated no significant effusion.  The patient will be observed overnight and repeat echocardiogram obtained.  Echocardiogram 01/30/2021:  1. Left ventricular ejection fraction, by estimation, is 60 to 65%. The  left ventricle has normal function. The left ventricle has no regional  wall motion abnormalities. There is mild concentric left ventricular  hypertrophy. Left ventricular diastolic  parameters are consistent with Grade I diastolic dysfunction (impaired  relaxation).   2. Right ventricular systolic function is normal. The right ventricular  size is normal. Tricuspid regurgitation signal is inadequate for assessing  PA pressure.   3. The mitral valve is degenerative. No evidence of mitral valve  regurgitation. No evidence of mitral  stenosis.   4. The aortic valve is grossly normal. Aortic valve regurgitation is not  visualized. No aortic stenosis is present.   5. The inferior vena cava is normal in size with greater than 50%  respiratory variability, suggesting right atrial pressure of 3 mmHg.   Assessment and Plan:  1.  Severe multivessel CAD status post CABG with graft disease as discussed above and limited revascularization options.  He underwent placement of DES x3 overlapping within the proximal to distal RCA back in September 2022.  Left system disease more difficult to assess and managed medically.  He reports fatigue and shortness of breath, period of several months, no definite angina.  Blood pressure has been low normal to low range.  Plan is to decrease Norvasc to 5 mg daily.  Obtain echocardiogram.  Continue aspirin, Brilinta, Benicar, Lopressor, Imdur, Zetia, and Crestor.  We will bring him back for reevaluation.  2.  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  Last LDL 39 in September 2022.  3.  PAD, followed by Dr. Scot Dock.  4.  Emphysema, followed by Dr. Melvyn Novas.  Medication Adjustments/Labs and Tests Ordered: Current medicines are reviewed at length with the patient today.  Concerns regarding medicines are outlined above.   Tests Ordered: Orders Placed This Encounter  Procedures   ECHOCARDIOGRAM COMPLETE    Medication Changes: Meds ordered this encounter  Medications   amLODipine (NORVASC) 5 MG tablet    Sig: Take 1 tablet (5 mg total) by mouth daily.    Dispense:  90 tablet    Refill:  1    10/15/2021 dose decrease    Disposition:  Follow up  6 to 8 weeks.  Signed, Satira Sark, MD, [facility] 10/15/2021 4:41 PM    [facility] [place], [place], [place] [address] Phone: [phone]; Fax: [phone]

## 2021-10-15 NOTE — Patient Instructions (Addendum)
Medication Instructions:  Your physician has recommended you make the following change in your medication:  Decrease amlodipine to 5 mg daily Continue other medications the same  Labwork: none  Testing/Procedures: Your physician has requested that you have an echocardiogram. Echocardiography is a painless test that uses sound waves to create images of your heart. It provides your doctor with information about the size and shape of your heart and how well your heart's chambers and valves are working. This procedure takes approximately one hour. There are no restrictions for this procedure.  Follow-Up: Your physician recommends that you schedule a follow-up appointment in: 6-8 weeks  Any Other Special Instructions Will Be Listed Below (If Applicable).  If you need a refill on your cardiac medications before your next appointment, please call your pharmacy.

## 2021-10-16 ENCOUNTER — Ambulatory Visit: Payer: Medicare Other

## 2021-10-16 DIAGNOSIS — R0602 Shortness of breath: Secondary | ICD-10-CM

## 2021-10-16 LAB — ECHOCARDIOGRAM COMPLETE
Area-P 1/2: 2.11 cm2
Calc EF: 64.1 %
P 1/2 time: 786 msec
S' Lateral: 1.52 cm
Single Plane A2C EF: 64.8 %
Single Plane A4C EF: 64.9 %

## 2021-11-01 ENCOUNTER — Encounter: Payer: Self-pay | Admitting: Internal Medicine

## 2021-11-01 ENCOUNTER — Ambulatory Visit (INDEPENDENT_AMBULATORY_CARE_PROVIDER_SITE_OTHER): Payer: Medicare Other | Admitting: Internal Medicine

## 2021-11-01 DIAGNOSIS — J439 Emphysema, unspecified: Secondary | ICD-10-CM

## 2021-11-01 DIAGNOSIS — I25119 Atherosclerotic heart disease of native coronary artery with unspecified angina pectoris: Secondary | ICD-10-CM

## 2021-11-01 DIAGNOSIS — J449 Chronic obstructive pulmonary disease, unspecified: Secondary | ICD-10-CM

## 2021-11-01 NOTE — Progress Notes (Signed)
William [name]., male    DOB: 1940/04/09,    MRN: [id number]   Brief patient profile:  81yowm quit smoking 1993  with GOLD 2 COPD  criteria 05/2015 while on spiriva with reported doe = MMRC 1  but did not  think the spiriva dpi is working as well and also limited by claudication and sometimes ex cp with known IHD so referred to pulmonary clinic in [place]  10/11/2020 by Dr  Domenic Polite     History of Present Illness  10/11/2020  Pulmonary/ 1st office eval/ Astin Sayre / [name]  Chief Complaint  Patient presents with   Consult    Patient has COPD and patient states that he has been having trouble with his heart and was sent here to make sure his lungs are ok. Wife died of lung cancer. Shortness of breath with exertion. Takes Spiriva  Dyspnea:  legs usually stop him 1st x 100 ft  flat and slow    Cough: not much Sleep: flat bed/ one pillow  SABA use: none  Rec Stop spiriva and start Stiolto 2 puffs first thing in Am x 2 weeks then try 1 puff daily x 1 weeks then try nothing to see if it really make a difference Work on inhaler technique: If worse chest pains with activity on the stiolto 2 puffs, can try one puff instead but if it persists, stop stiolto If even the 2 puffs doesn't help your breathing , you need a trial off lisinopril 40 and on benicar 40  for at least 4 weeks - call if you want to try it and I'll call it in for you.       03/26/2021  f/u ov/[place] office/Chenell Lozon re: gold 2 COPD on no maint rx   Chief Complaint  Patient presents with   Follow-up    Feels SOB is about the same last visit.   Pt started cardiac rehab is hoping it will help his SOB.   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[employer]  Cough: sensation of throat tickling, not really a cough Sleeping: on cpap/flat bed / one pillow SABA use: none  02: none  Covid status: vax x 3  Occ dysphagia, has had egd in past   Rec  Try prilosec otc '20mg'$   Take 30-60 min  before first meal of the day and Pepcid ac (famotidine) 20 mg one @  bedtime until cough is completely gone for at least a week without the need for cough suppression GERD diet reviewed, bed blocks rec           09/20/2021  f/u ov/[place] office/Syretta Kochel re: GOLD 2 / ?PF  maint on no rx  Chief Complaint  Patient presents with   Follow-up    SOB is about the same since last ov.   Dyspnea:  says breathing not been right since stents despite rehab  Cough: mostly throat clearing  Sleeping: cpap at hs / once a week wakes up slt bloody sputum  SABA use: none  02: none  Covid status: "all of em" Rec Resume stiolto 2 puffs each am  until return  Keep up the walking as much as you can to see if stiolto helps (like high octane fuel should help your engine) Try prilosec otc (omepazole) 20 mg  Take 30-60 min before first meal of the day and Pepcid ac (famotidine) 20 mg one @  bedtime until return.  Add needs alpha  one AT screening next ov  11/01/2021  f/u ov/[place] office/Maor Meckel re: emphysema/ GOLD 2  maint on nothing Chief Complaint  Patient presents with   Follow-up    Feels his breathing is about the same since last ov.   Dyspnea:  does ok [employer] s 02/ no HC parking  Cough: minimal / mucoid in am  Sleeping: cpap Atar  SABA use: none  02: none     No obvious day to day or daytime variability or assoc excess/ purulent sputum or mucus plugs or hemoptysis or cp or chest tightness, subjective wheeze or overt sinus or hb symptoms.   Ok as Glynis Smiles e without nocturnal  or early am exacerbation  of respiratory  c/o's or need for noct saba. Also denies any obvious fluctuation of symptoms with weather or environmental changes or other aggravating or alleviating factors except as outlined above   No unusual exposure hx or h/o childhood pna/ asthma or knowledge of premature birth.  Current Allergies, Complete Past Medical History, Past Surgical History, Family History, and Social History were  reviewed in [employer] record.  ROS  The following are not active complaints unless bolded Hoarseness, sore throat, dysphagia, dental problems, itching, sneezing,  nasal congestion or discharge of excess mucus or purulent secretions, ear ache,   fever, chills, sweats, unintended wt loss or wt gain, classically pleuritic or exertional cp,  orthopnea pnd or arm/hand swelling  or leg swelling, presyncope, palpitations, abdominal pain, anorexia, nausea, vomiting, diarrhea  or change in bowel habits or change in bladder habits, change in stools or change in urine, dysuria, hematuria,  rash, arthralgias, visual complaints, headache, numbness, weakness or ataxia or problems with walking or coordination,  change in mood or  memory.        Current Meds  Medication Sig   amLODipine (NORVASC) 5 MG tablet Take 1 tablet (5 mg total) by mouth daily.   Ascorbic Acid (VITAMIN C) 1000 MG tablet Take 1,000 mg by mouth in the morning.   aspirin EC 81 MG tablet Take 1 tablet (81 mg total) by mouth daily.   cholecalciferol (VITAMIN D) 25 MCG (1000 UNIT) tablet Take 1,000 Units by mouth in the morning.   doxycycline (VIBRA-TABS) 100 MG tablet Take 1 tablet (100 mg total) by mouth 2 (two) times daily.   ezetimibe (ZETIA) 10 MG tablet Take 1 tablet (10 mg total) by mouth daily.   famotidine (PEPCID) 20 MG tablet Take 20 mg by mouth every evening.   fish oil-omega-3 fatty acids 1000 MG capsule Take 1,000 mg by mouth in the morning.   fluticasone (FLONASE) 50 MCG/ACT nasal spray Place 2 sprays into both nostrils daily.   folic acid (FOLVITE) 749 MCG tablet Take 400 mcg by mouth in the morning.   Garlic 4496 MG TBEC Take 2,000 mg by mouth in the morning.   isosorbide mononitrate (IMDUR) 60 MG 24 hr tablet Take 1 tablet (60 mg total) by mouth daily.   metoprolol tartrate (LOPRESSOR) 50 MG tablet Take 1 tablet (50 mg total) by mouth 2 (two) times daily.   Multiple Vitamin (MULTIVITAMIN WITH MINERALS)  TABS tablet Take 1 tablet by mouth in the morning.   nitroGLYCERIN (NITROSTAT) 0.4 MG SL tablet Place 1 tablet (0.4 mg total) under the tongue every 5 (five) minutes as needed for chest pain. If pain does not resolve with rest.   olmesartan (BENICAR) 40 MG tablet TAKE 1 TABLET BY MOUTH EVERY DAY   omega-3 acid ethyl  esters (LOVAZA) 1 g capsule Take 1 capsule (1 g total) by mouth daily.   rosuvastatin (CRESTOR) 10 MG tablet TAKE 1 TABLET BY MOUTH EVERY DAY N   thiamine (VITAMIN B-1) 100 MG tablet Take 100 mg by mouth every evening.    thyroid (NP THYROID) 30 MG tablet Take 1 tablet (30 mg total) by mouth daily before breakfast.   ticagrelor (BRILINTA) 90 MG TABS tablet Take 1 tablet (90 mg total) by mouth 2 (two) times daily.                     Objective:    Wts  11/01/2021          146  09/20/2021       156  03/26/2021     159   12/20/20 161 lb 1.9 oz (73.1 kg)  12/13/20 162 lb 9.6 oz (73.8 kg)  12/11/20 164 lb (74.4 kg)    Vital signs reviewed  11/01/2021  - Note at rest 02 sats  96% on RA   General appearance:    amb eldelry wm nad    HEENT : Oropharynx  clear/ dentition ok   Nasal turbinates nl    NECK :  without  apparent JVD/ palpable Nodes/TM    LUNGS: no acc muscle use,  Mild barrel /kyphotic contour chest wall with bilateral  Distant bs s audible wheeze and  without cough on insp or exp maneuvers  and mild  Hyperresonant  to  percussion bilaterally     CV:  RRR  no s3 or murmur or increase in P2, and no edema   ABD:  soft and nontender with pos end  insp Hoover's  in the supine position.  No bruits or organomegaly appreciated   MS:  Nl gait/ ext warm without deformities Or obvious joint restrictions  calf tenderness, cyanosis or clubbing     SKIN: warm and dry without lesions    NEURO:  alert, approp, nl sensorium with  no motor or cerebellar deficits apparent.              Assessment

## 2021-11-01 NOTE — Assessment & Plan Note (Signed)
Quit smoking 1993 - Spirometry 05/21/2015   FEV1 1.95 (74%)  Ratio 0.66? p spiriva  10/11/2020   Walked RA  approx  341f  @ nl pace  stopped due to  Leg pain, no sob and sats 95% ; - 10/11/2020    try stiolto 2 puffs each am  > no better doe so stopped all inhalers - 09/20/2021  After extensive coaching inhaler device,  effectiveness =    75% try stiolto again  - 09/20/2021   Walked on RA  x  3  lap(s) =  approx 450  ft  @ mod pace, stopped due to end of study with lowest 02 sats 94% sob at start of 3rd lap but did not stop, no assoc claudication /cp  - 09/2021 d/c'd stiolto  - no better ex tol on vs off  Labs ordered 11/01/2021  :     alpha one AT phenotype    Pt is Group B in terms of symptom/risk and laba/lama therefore appropriate rx at this point >>>  Declines further emprical rx, will return for pfts w/a    Discussed in detail all the  indications, usual  risks and alternatives  relative to the benefits with patient who agrees to proceed with w/u as outlined.            Each maintenance medication was reviewed in detail including emphasizing most importantly the difference between maintenance and prns and under what circumstances the prns are to be triggered using an action plan format where appropriate.  Total time for H and P, chart review, counseling,  and generating customized AVS unique to this office visit / same day charting = 28 min

## 2021-11-01 NOTE — Patient Instructions (Signed)
Schedule PFTs at [facility] office when I am going to be there.   Please remember to go to the lab department   for your tests - we will call you with the results when they are available.      No change in recommendations in meantime

## 2021-11-05 LAB — CBC WITH DIFFERENTIAL/PLATELET
Basophils Absolute: 0 10*3/uL (ref 0.0–0.2)
Basos: 1 %
EOS (ABSOLUTE): 0.5 10*3/uL — ABNORMAL HIGH (ref 0.0–0.4)
Eos: 6 %
Hematocrit: 35.1 % — ABNORMAL LOW (ref 37.5–51.0)
Hemoglobin: 11.6 g/dL — ABNORMAL LOW (ref 13.0–17.7)
Immature Grans (Abs): 0 10*3/uL (ref 0.0–0.1)
Immature Granulocytes: 1 %
Lymphocytes Absolute: 1 10*3/uL (ref 0.7–3.1)
Lymphs: 12 %
MCH: 31.1 pg (ref 26.6–33.0)
MCHC: 33 g/dL (ref 31.5–35.7)
MCV: 94 fL (ref 79–97)
Monocytes Absolute: 0.9 10*3/uL (ref 0.1–0.9)
Monocytes: 10 %
Neutrophils Absolute: 5.9 10*3/uL (ref 1.4–7.0)
Neutrophils: 70 %
Platelets: 311 10*3/uL (ref 150–450)
RBC: 3.73 x10E6/uL — ABNORMAL LOW (ref 4.14–5.80)
RDW: 13.2 % (ref 11.6–15.4)
WBC: 8.3 10*3/uL (ref 3.4–10.8)

## 2021-11-05 LAB — BASIC METABOLIC PANEL
BUN/Creatinine Ratio: 20 (ref 10–24)
BUN: 38 mg/dL — ABNORMAL HIGH (ref 8–27)
CO2: 17 mmol/L — ABNORMAL LOW (ref 20–29)
Calcium: 9.6 mg/dL (ref 8.6–10.2)
Chloride: 108 mmol/L — ABNORMAL HIGH (ref 96–106)
Creatinine, Ser: 1.9 mg/dL — ABNORMAL HIGH (ref 0.76–1.27)
Glucose: 105 mg/dL — ABNORMAL HIGH (ref 70–99)
Potassium: 5.3 mmol/L — ABNORMAL HIGH (ref 3.5–5.2)
Sodium: 142 mmol/L (ref 134–144)
eGFR: 35 mL/min/{1.73_m2} — ABNORMAL LOW (ref >59)

## 2021-11-05 LAB — ALPHA-1-ANTITRYPSIN PHENOTYP: A-1 Antitrypsin: 173 mg/dL (ref 101–187)

## 2021-11-06 ENCOUNTER — Telehealth: Payer: Self-pay | Admitting: Internal Medicine

## 2021-11-06 ENCOUNTER — Other Ambulatory Visit: Payer: Self-pay | Admitting: Cardiology

## 2021-11-06 MED ORDER — VALSARTAN 160 MG PO TABS: 160.0000 mg | ORAL_TABLET | Freq: Every day | ORAL | 0 refills | Status: DC

## 2021-11-06 NOTE — Telephone Encounter (Signed)
Called and spoke to patient about lab results    Tanda Rockers, MD  11/05/2021  6:25 AM EDT     Call patient :  Studies are worrisome as kidney function is worse on benicar 40   Would try valsartan 160 and recheck bmet/ ov in one week or have PCP / cards do f/u - his choice    Sent in valsartan 160 to [facility].  Scheduled patient for an ov Thursday 8/17 at 11:30 and to get labs rechecked. Patient is double booked. Will route to Dr. Melvyn Novas to make him aware.

## 2021-11-06 NOTE — Telephone Encounter (Signed)
ATC patient back. LVMTCB.

## 2021-11-07 NOTE — Telephone Encounter (Signed)
aware

## 2021-11-14 ENCOUNTER — Encounter: Payer: Self-pay | Admitting: Internal Medicine

## 2021-11-14 ENCOUNTER — Ambulatory Visit (INDEPENDENT_AMBULATORY_CARE_PROVIDER_SITE_OTHER): Payer: Medicare Other | Admitting: Internal Medicine

## 2021-11-14 DIAGNOSIS — J449 Chronic obstructive pulmonary disease, unspecified: Secondary | ICD-10-CM | POA: Diagnosis not present

## 2021-11-14 DIAGNOSIS — R0609 Other forms of dyspnea: Secondary | ICD-10-CM | POA: Diagnosis not present

## 2021-11-14 DIAGNOSIS — I10 Essential (primary) hypertension: Secondary | ICD-10-CM

## 2021-11-14 DIAGNOSIS — R058 Other specified cough: Secondary | ICD-10-CM

## 2021-11-14 DIAGNOSIS — I25119 Atherosclerotic heart disease of native coronary artery with unspecified angina pectoris: Secondary | ICD-10-CM

## 2021-11-14 DIAGNOSIS — D649 Anemia, unspecified: Secondary | ICD-10-CM | POA: Diagnosis not present

## 2021-11-14 MED ORDER — OMEPRAZOLE MAGNESIUM 20 MG PO TBEC: 20.0000 mg | DELAYED_RELEASE_TABLET | Freq: Every day | ORAL | Status: DC

## 2021-11-14 NOTE — Patient Instructions (Addendum)
Continue to take omeprazole  otc (omeprazole) '20mg'$   Take 30-60 min before first meal of the day and Pepcid ac (famotidine) 20 mg one @  bedtime until cough is completely gone for at least a week without the need for cough suppression  GERD (REFLUX)  is an extremely common cause of respiratory symptoms just like yours , many times with no obvious heartburn at all.    It can be treated with medication, but also with lifestyle changes including elevation of the head of your bed (ideally with 6 -8inch blocks under the headboard of your bed),  Smoking cessation, avoidance of late meals, excessive alcohol, and avoid fatty foods, chocolate, peppermint, colas, red wine, and acidic juices such as orange juice.  NO MINT OR MENTHOL PRODUCTS SO NO COUGH DROPS  USE SUGARLESS CANDY INSTEAD (Jolley ranchers or Stover's or Life Savers) or even ice chips will also do - the key is to swallow to prevent all throat clearing. NO OIL BASED VITAMINS - use powdered substitutes.  Avoid fish oil when coughing.   Contact Dr Pearline Cables for GI and ENT evaluation in [facility] for your throat and swallowing problems  - call me if you need my office to help   Please remember to go to the lab corps   for your tests - we will call you with the results when they are available.  Keep previous appts with me

## 2021-11-14 NOTE — Progress Notes (Signed)
William [name]., male    DOB: 01-21-1941,    MRN: [id number]   Brief patient profile:  81yowm quit smoking 1993  with GOLD 2 COPD  criteria 05/2015 while on spiriva with reported doe = MMRC 1  but did not  think the spiriva dpi is working as well and also limited by claudication and sometimes ex cp with known IHD so referred to pulmonary clinic in [place]  10/11/2020 by Dr  Domenic Polite     History of Present Illness  10/11/2020  Pulmonary/ 1st office eval/ Zachary Nole / [name]  Chief Complaint  Patient presents with   Consult    Patient has COPD and patient states that he has been having trouble with his heart and was sent here to make sure his lungs are ok. Wife died of lung cancer. Shortness of breath with exertion. Takes Spiriva  Dyspnea:  legs usually stop him 1st x 100 ft  flat and slow    Cough: not much Sleep: flat bed/ one pillow  SABA use: none  Rec Stop spiriva and start Stiolto 2 puffs first thing in Am x 2 weeks then try 1 puff daily x 1 weeks then try nothing to see if it really make a difference Work on inhaler technique: If worse chest pains with activity on the stiolto 2 puffs, can try one puff instead but if it persists, stop stiolto If even the 2 puffs doesn't help your breathing , you need a trial off lisinopril 40 and on benicar 40  for at least 4 weeks - call if you want to try it and I'll call it in for you.       03/26/2021  f/u ov/[place] office/Clarke Amburn re: gold 2 COPD on no maint rx   Chief Complaint  Patient presents with   Follow-up    Feels SOB is about the same last visit.   Pt started cardiac rehab is hoping it will help his SOB.   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[employer]  Cough: sensation of throat tickling, not really a cough Sleeping: on cpap/flat bed / one pillow SABA use: none  02: none  Covid status: vax x 3  Occ dysphagia, has had egd in past   Rec  Try prilosec otc '20mg'$   Take 30-60 min  before first meal of the day and Pepcid ac (famotidine) 20 mg one @  bedtime until cough is completely gone for at least a week without the need for cough suppression GERD diet reviewed, bed blocks rec           09/20/2021  f/u ov/[place] office/Cybill Uriegas re: GOLD 2 / ?PF  maint on no rx  Chief Complaint  Patient presents with   Follow-up    SOB is about the same since last ov.   Dyspnea:  says breathing not been right since stents despite rehab  Cough: mostly throat clearing  Sleeping: cpap at hs / once a week wakes up slt bloody sputum  SABA use: none  02: none  Covid status: "all of em" Rec Resume stiolto 2 puffs each am  until return  Keep up the walking as much as you can to see if stiolto helps (like high octane fuel should help your engine) Try prilosec otc (omepazole) 20 mg  Take 30-60 min before first meal of the day and Pepcid ac (famotidine) 20 mg one @  bedtime until return.  11/01/2021  f/u ov/[place] office/Leandrew Keech re: emphysema/ GOLD 2  maint on nothing Chief Complaint  Patient presents with   Follow-up    Feels his breathing is about the same since last ov.   Dyspnea:  does ok [employer] s 02/ no HC parking  Cough: minimal / mucoid in am  Sleeping: cpap Atar  SABA use: none  02: none  Rec Schedule PFTs at [facility] office when I am going to be there.  Please remember to go to the lab department   for your tests - we will call you with the results when they are available   11/14/2021  f/u ov/[place] office/Marlinda Miranda re: GOLD 2/ hbp with cri  maint on nothing   Chief Complaint  Patient presents with   Follow-up    Follow up after lab result change bp med. 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   Sleeping  without nocturnal  or early am exacerbation  of respiratory  c/o's or need for noct saba. Also denies any obvious fluctuation of symptoms with weather or environmental changes or other aggravating or alleviating factors except as outlined above   No unusual exposure hx or h/o childhood pna/ asthma or knowledge of premature birth.  Current Allergies, Complete Past Medical History, Past Surgical History, Family History, and Social History were reviewed in [employer] record.  ROS  The following are not active complaints unless bolded Hoarseness, sore throat, dysphagia, dental problems, itching, sneezing,  nasal congestion or discharge of excess mucus or purulent secretions, ear ache,   fever, chills, sweats, unintended wt loss or wt gain, classically pleuritic or exertional cp,  orthopnea pnd or arm/hand swelling  or leg swelling, presyncope, palpitations, abdominal pain, anorexia, nausea, vomiting, diarrhea  or change in bowel habits or change in bladder habits, change in stools or change in urine, dysuria, hematuria,  rash, arthralgias, visual complaints, headache, numbness, weakness or ataxia or problems with walking or coordination,  change in mood or  memory.        Current Meds  Medication Sig   amLODipine (NORVASC) 5 MG tablet Take 1 tablet (5 mg total) by mouth daily.   Ascorbic Acid (VITAMIN C) 1000 MG tablet Take 1,000 mg by mouth in the morning.   aspirin EC 81 MG tablet Take 1 tablet (81 mg total) by mouth daily.   cholecalciferol (VITAMIN D) 25 MCG (1000 UNIT) tablet Take 1,000 Units by mouth in the morning.   ezetimibe (ZETIA) 10 MG tablet TAKE 1 TABLET BY MOUTH EVERY DAY   famotidine (PEPCID) 20 MG tablet Take 20 mg by mouth every evening.   fish oil-omega-3 fatty acids  1000 MG capsule Take 1,000 mg by mouth in the morning.   fluticasone (FLONASE) 50 MCG/ACT nasal spray Place 2 sprays into both nostrils daily.   folic acid (FOLVITE) 654 MCG tablet Take 400 mcg by mouth in the morning.   Garlic 6503 MG TBEC Take 2,000 mg by mouth in the  morning.   isosorbide mononitrate (IMDUR) 60 MG 24 hr tablet Take 1 tablet (60 mg total) by mouth daily.   metoprolol tartrate (LOPRESSOR) 50 MG tablet Take 1 tablet (50 mg total) by mouth 2 (two) times daily.   Multiple Vitamin (MULTIVITAMIN WITH MINERALS) TABS tablet Take 1 tablet by mouth in the morning.   nitroGLYCERIN (NITROSTAT) 0.4 MG SL tablet Place 1 tablet (0.4 mg total) under the tongue every 5 (five) minutes as needed for chest pain. If pain does not resolve with rest.   omega-3 acid ethyl esters (LOVAZA) 1 g capsule Take 1 capsule (1 g total) by mouth daily.   rosuvastatin (CRESTOR) 10 MG tablet TAKE 1 TABLET BY MOUTH EVERY DAY N   thiamine (VITAMIN B-1) 100 MG tablet Take 100 mg by mouth every evening.    thyroid (NP THYROID) 30 MG tablet Take 1 tablet (30 mg total) by mouth daily before breakfast.   ticagrelor (BRILINTA) 90 MG TABS tablet Take 1 tablet (90 mg total) by mouth 2 (two) times daily.   valsartan (DIOVAN) 160 MG tablet Take 1 tablet (160 mg total) by mouth daily.                  Objective:    Wts  11/14/2021       151  11/01/2021         146  09/20/2021       156  03/26/2021     159   12/20/20 161 lb 1.9 oz (73.1 kg)  12/13/20 162 lb 9.6 oz (73.8 kg)  12/11/20 164 lb (74.4 kg)     Vital signs reviewed  11/14/2021  - Note at rest 02 sats  95% on RA   General appearance:    amb elderly wm nad    HEENT : Oropharynx  clear  Nasal turbinates nl    NECK :  without  apparent JVD/ palpable Nodes/TM    LUNGS: no acc muscle use,  Mild barrel  contour chest wall with bilateral  Distant bs s audible wheeze and  without cough on insp or exp maneuvers  and mild  Hyperresonant  to  percussion  bilaterally     CV:  RRR  no s3 or murmur or increase in P2, and no edema   ABD:  soft and nontender with pos end  insp Hoover's  in the supine position.  No bruits or organomegaly appreciated   MS:  Nl gait/ ext warm without deformities Or obvious joint restrictions  calf tenderness, cyanosis or clubbing     SKIN: warm and dry without lesions    NEURO:  alert, approp, nl sensorium with  no motor or cerebellar deficits apparent.         Assessment

## 2021-11-15 ENCOUNTER — Other Ambulatory Visit: Payer: Self-pay | Admitting: Nurse Practitioner

## 2021-11-15 ENCOUNTER — Encounter: Payer: Self-pay | Admitting: Internal Medicine

## 2021-11-15 ENCOUNTER — Telehealth: Payer: Self-pay | Admitting: Nurse Practitioner

## 2021-11-15 DIAGNOSIS — D649 Anemia, unspecified: Secondary | ICD-10-CM | POA: Insufficient documentation

## 2021-11-15 DIAGNOSIS — K219 Gastro-esophageal reflux disease without esophagitis: Secondary | ICD-10-CM

## 2021-11-15 LAB — BASIC METABOLIC PANEL
BUN/Creatinine Ratio: 18 (ref 10–24)
BUN: 25 mg/dL (ref 8–27)
CO2: 16 mmol/L — ABNORMAL LOW (ref 20–29)
Calcium: 8.9 mg/dL (ref 8.6–10.2)
Chloride: 106 mmol/L (ref 96–106)
Creatinine, Ser: 1.42 mg/dL — ABNORMAL HIGH (ref 0.76–1.27)
Glucose: 87 mg/dL (ref 70–99)
Potassium: 4.5 mmol/L (ref 3.5–5.2)
Sodium: 140 mmol/L (ref 134–144)
eGFR: 50 mL/min/{1.73_m2} — ABNORMAL LOW (ref >59)

## 2021-11-15 LAB — CBC WITH DIFFERENTIAL/PLATELET
Basophils Absolute: 0.1 10*3/uL (ref 0.0–0.2)
Basos: 1 %
EOS (ABSOLUTE): 0.8 10*3/uL — ABNORMAL HIGH (ref 0.0–0.4)
Eos: 9 %
Hematocrit: 32.9 % — ABNORMAL LOW (ref 37.5–51.0)
Hemoglobin: 11.1 g/dL — ABNORMAL LOW (ref 13.0–17.7)
Immature Grans (Abs): 0 10*3/uL (ref 0.0–0.1)
Immature Granulocytes: 0 %
Lymphocytes Absolute: 1.3 10*3/uL (ref 0.7–3.1)
Lymphs: 15 %
MCH: 31.7 pg (ref 26.6–33.0)
MCHC: 33.7 g/dL (ref 31.5–35.7)
MCV: 94 fL (ref 79–97)
Monocytes Absolute: 0.9 10*3/uL (ref 0.1–0.9)
Monocytes: 10 %
Neutrophils Absolute: 5.7 10*3/uL (ref 1.4–7.0)
Neutrophils: 65 %
Platelets: 320 10*3/uL (ref 150–450)
RBC: 3.5 x10E6/uL — ABNORMAL LOW (ref 4.14–5.80)
RDW: 13.5 % (ref 11.6–15.4)
WBC: 8.9 10*3/uL (ref 3.4–10.8)

## 2021-11-15 NOTE — Assessment & Plan Note (Signed)
Lab Results  Component Value Date   CREATININE 1.42 (H) 11/14/2021   CREATININE 1.90 (H) 11/01/2021   CREATININE 1.38 (H) 05/31/2021    11/05/2021 rec changed benicar 40 to valsartan 160  > excellent bp / improved creat back to baseline so nothing further needed

## 2021-11-15 NOTE — Telephone Encounter (Signed)
Pt is requesting a referral for GI and ENT. Pt states that at the end of his visit on 08/23/21 William [name] told him if wanted the GI and ENT referral to just call and let her know. He said his pulmonary doctor said he thinks pt needs it.   Please advise

## 2021-11-15 NOTE — Assessment & Plan Note (Signed)
Quit smoking 1993/MM - Spirometry 05/21/2015   FEV1 1.95 (74%)  Ratio 0.66? p spiriva  10/11/2020   Walked RA  approx  351f  @ nl pace  stopped due to  Leg pain, no sob and sats 95% ; - 10/11/2020    try stiolto 2 puffs each am  > no better doe so stopped all inhalers - 09/20/2021  After extensive coaching inhaler device,  effectiveness =    75% try stiolto again  - 09/20/2021   Walked on RA  x  3  lap(s) =  approx 450  ft  @ mod pace, stopped due to end of study with lowest 02 sats 94% sob at start of 3rd lap but did not stop, no assoc claudication /cp  - 09/2021 d/c'd stiolto  - no better ex tol on vs off  Labs ordered 11/01/2021  :     alpha one AT phenotype  MM   Level 173   He doesn't feel any better on vs off meds inhalers and main concerns are mostly upper airway ? Related to gerd so plan is for GI eval then regroup here p GI eval complete

## 2021-11-15 NOTE — Assessment & Plan Note (Signed)
  Lab Results  Component Value Date   HGB 11.1 (L) 11/14/2021   HGB 11.6 (L) 11/01/2021   HGB 13.1 04/11/2021   HGB 13.6 12/22/2020   HGB 16.0 12/21/2020     Assoc with dysphagia so GI eval rec          Each maintenance medication was reviewed in detail including emphasizing most importantly the difference between maintenance and prns and under what circumstances the prns are to be triggered using an action plan format where appropriate.  Total time for H and P, chart review, counseling,  and generating customized AVS unique to this office visit / same day charting = 26 min with pt's son in attendance as well and multiple questions answered

## 2021-11-15 NOTE — Telephone Encounter (Signed)
Please advise and let me know how I may assist.

## 2021-11-21 ENCOUNTER — Other Ambulatory Visit: Payer: Self-pay | Admitting: Nurse Practitioner

## 2021-11-21 DIAGNOSIS — R5383 Other fatigue: Secondary | ICD-10-CM

## 2021-11-28 ENCOUNTER — Ambulatory Visit (INDEPENDENT_AMBULATORY_CARE_PROVIDER_SITE_OTHER): Payer: Medicare Other | Admitting: Nurse Practitioner

## 2021-11-28 ENCOUNTER — Other Ambulatory Visit: Payer: Self-pay | Admitting: Internal Medicine

## 2021-11-28 VITALS — BP 132/60 | HR 80 | Temp 97.9°F | Ht 66.0 in | Wt 150.5 lb

## 2021-11-28 DIAGNOSIS — R1310 Dysphagia, unspecified: Secondary | ICD-10-CM

## 2021-11-28 DIAGNOSIS — Z23 Encounter for immunization: Secondary | ICD-10-CM

## 2021-11-28 DIAGNOSIS — R5383 Other fatigue: Secondary | ICD-10-CM

## 2021-11-28 DIAGNOSIS — K219 Gastro-esophageal reflux disease without esophagitis: Secondary | ICD-10-CM | POA: Diagnosis not present

## 2021-11-28 LAB — T4, FREE: Free T4: 0.83 ng/dL (ref 0.60–1.60)

## 2021-11-28 LAB — TSH: TSH: 1.67 u[IU]/mL (ref 0.35–5.50)

## 2021-11-28 LAB — T3, FREE: T3, Free: 2.8 pg/mL (ref 2.3–4.2)

## 2021-11-28 MED ORDER — THYROID 30 MG PO TABS: 30.0000 mg | ORAL_TABLET | Freq: Every day | ORAL | 1 refills | Status: DC

## 2021-11-28 NOTE — Assessment & Plan Note (Signed)
He continues to tolerate NP thyroid well.  Will recheck thyroid panel today, refill NP thyroid sent to patient's pharmacy today.  May make dose adjustments based on lab results.

## 2021-11-28 NOTE — Assessment & Plan Note (Signed)
Ongoing 2 months, not being controlled well by current GERD medications.  Referral to GI and ENT made for further evaluation.

## 2021-11-28 NOTE — Progress Notes (Signed)
Established Patient Office Visit  Subjective   Patient ID: William Warmuth., male    DOB: [birth year]  Age: 81 y.o. MRN: [id number]  Chief Complaint  Patient presents with   Fatigue    Fatigue: He is currently on NP thyroid for the off label treatment of this.  Last thyroid panel was checked approximately 6 days ago and was within normal limits.  He is requesting refill today.  Health maintenance: He has had pneumococcal 23 vaccine administered in 2018 but has not had any other pneumonia vaccine per medical records.  COPD: He continues to experience shortness of breath and is being evaluated by pulmonology.  He reports he is scheduled for pulmonary function testing next week.  Dysphagia/odynophagia: Has been ongoing for about 2 months. On famotidine and omeprazole. Only occurs intermittently, no obvious triggers. Has long standing history of GERD and hiatal hernia. Would like evaluation with GI and ENT.    Review of Systems  Respiratory:  Positive for shortness of breath.   Cardiovascular:  Negative for chest pain.  Gastrointestinal:  Positive for heartburn. Negative for abdominal pain.      Objective:     BP 132/60 (BP Location: Right Arm, Patient Position: Sitting, Cuff Size: Small)   Pulse 80   Temp 97.9 F (36.6 C) (Oral)   Ht '5\' 6"'$  (1.676 m)   Wt 150 lb 8 oz (68.3 kg)   SpO2 94%   BMI 24.29 kg/m    Physical Exam Vitals reviewed.  Constitutional:      Appearance: Normal appearance.  HENT:     Head: Normocephalic and atraumatic.  Cardiovascular:     Rate and Rhythm: Normal rate and regular rhythm.  Pulmonary:     Effort: Pulmonary effort is normal.     Breath sounds: Normal breath sounds.  Musculoskeletal:     Cervical back: Neck supple.  Skin:    General: Skin is warm and dry.  Neurological:     Mental Status: He is alert and oriented to person, place, and time.  Psychiatric:        Mood and Affect: Mood normal.        Behavior: Behavior normal.         Thought Content: Thought content normal.        Judgment: Judgment normal.      No results found for any visits on 11/28/21.    The ASCVD Risk score (Arnett DK, et al., 2019) failed to calculate for the following reasons:   The 2019 ASCVD risk score is only valid for ages 70 to 57   The patient has a prior MI or stroke diagnosis    Assessment & Plan:   Problem List Items Addressed This Visit       Digestive   Gastroesophageal reflux disease    Ongoing 2 months, not being controlled well by current GERD medications.  Referral to GI and ENT made for further evaluation.      Relevant Orders   Ambulatory referral to ENT   Ambulatory referral to Gastroenterology   Dysphagia - Primary    Ongoing 2 months, not being controlled well by current GERD medications.  Referral to GI and ENT made for further evaluation.      Relevant Orders   Ambulatory referral to ENT   Ambulatory referral to Gastroenterology     Other   Fatigue    He continues to tolerate NP thyroid well.  Will recheck thyroid panel today, refill NP thyroid  sent to patient's pharmacy today.  May make dose adjustments based on lab results.      Relevant Medications   thyroid (NP THYROID) 30 MG tablet   Other Relevant Orders   TSH   T3, free   T4, free   Odynophagia    Ongoing 2 months, not being controlled well by current GERD medications.  Referral to GI and ENT made for further evaluation.      Relevant Orders   Ambulatory referral to ENT   Ambulatory referral to Gastroenterology   Need for vaccination    Pneumococcal 20 vaccine administered today.  Patient provided with vaccine information sheet.      Relevant Orders   Pneumococcal conjugate vaccine 20-valent (Prevnar 20) (Completed)    Return for As scheduled.    Ailene Ards, NP

## 2021-11-28 NOTE — Assessment & Plan Note (Addendum)
Pneumococcal 20 vaccine administered today.  Patient provided with vaccine information sheet.

## 2021-11-28 NOTE — Telephone Encounter (Signed)
Left message for a call back regarding referral 

## 2021-12-03 ENCOUNTER — Telehealth: Payer: Self-pay | Admitting: Nurse Practitioner

## 2021-12-03 ENCOUNTER — Other Ambulatory Visit: Payer: Self-pay | Admitting: Nurse Practitioner

## 2021-12-03 DIAGNOSIS — R5383 Other fatigue: Secondary | ICD-10-CM

## 2021-12-03 MED ORDER — THYROID 30 MG PO TABS: 30.0000 mg | ORAL_TABLET | Freq: Every day | ORAL | 1 refills | Status: DC

## 2021-12-03 NOTE — Telephone Encounter (Signed)
Pt has been unable to get his thyroid (NP THYROID) 30 MG tablet because [facility] and [facility] are out of stock.   Please re-send to [id number]  Phone:  [phone]  Fax:  [phone]

## 2021-12-04 ENCOUNTER — Other Ambulatory Visit: Payer: Self-pay | Admitting: Internal Medicine

## 2021-12-04 NOTE — Progress Notes (Signed)
Cardiology Office Note  Date: 12/05/2021   ID: William [name]., DOB 10-23-1940, MRN [id number]  PCP:  Ailene Ards, NP  Cardiologist:  Rozann Lesches, MD Electrophysiologist:  None   Chief Complaint  Patient presents with   Cardiac follow-up    History of Present Illness: William [name]. is an 81 y.o. male last seen in July.  He is here for a follow-up visit.  Reports reasonable angina control, chronic dyspnea on exertion and NYHA class II-III.  He is following with Dr. Melvyn Novas for treatment of his emphysema, also has pending GI evaluation.  Norvasc was decreased to 5 mg daily at the last visit.  Systolics have been in the 130s at home, measured higher today.  He does feel better however having allow blood pressure to come up some.  I reviewed the remainder of his medications as noted below.  Follow-up echocardiogram in July revealed LVEF 65 to 70% with mild diastolic dysfunction, trivial mitral regurgitation, and sclerotic aortic valve with mild aortic regurgitation.  I personally reviewed his ECG today which shows sinus rhythm with nonspecific ST-T changes.  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[facility])    Myocardial infarction ([place])    Peripheral arterial disease ([place])    Suboptimal revascularization options    Past Surgical History:  Procedure Laterality Date   ABDOMINAL AORTOGRAM W/LOWER EXTREMITY N/A 08/21/2017   Procedure: ABDOMINAL AORTOGRAM W/LOWER EXTREMITY;  Surgeon: Angelia Mould, MD;  Location: [facility];  Service: Cardiovascular;  Laterality: N/A;   Bilateral renal  artery stenoses  04/23/2009   CARDIAC CATHETERIZATION N/A 05/21/2015   Procedure: Left Heart Cath and Coronary Angiography;   Surgeon: Lorretta Harp, MD;  Location: [facility];  Service: Cardiovascular;  Laterality: N/A;   CORONARY ARTERY BYPASS GRAFT N/A 05/22/2015   Procedure: CORONARY ARTERY BYPASS GRAFTING (CABG) LIMA-LAD and SVG-OM EVH RIGHT THIGH GREATER SAPHENOUS VEIN;  Surgeon: [name], MD;  Location: [facility];  Service: Open Heart Surgery;  Laterality: N/A;   CORONARY ATHERECTOMY N/A 12/21/2020   Procedure: CORONARY ATHERECTOMY;  Surgeon: Burnell Blanks, MD;  Location: [facility];  Service: Cardiovascular;  Laterality: N/A;   CORONARY BALLOON ANGIOPLASTY N/A 12/06/2020   Procedure: CORONARY BALLOON ANGIOPLASTY;  Surgeon: Burnell Blanks, MD;  Location: [facility];  Service: Cardiovascular;  Laterality: N/A;   CORONARY STENT INTERVENTION N/A 12/21/2020   Procedure: CORONARY STENT INTERVENTION;  Surgeon: Burnell Blanks, MD;  Location: [facility];  Service: Cardiovascular;  Laterality: N/A;   HIATAL HERNIA REPAIR     INTRAVASCULAR ULTRASOUND/IVUS N/A 12/21/2020   Procedure: Intravascular Ultrasound/IVUS;  Surgeon: Burnell Blanks, MD;  Location: [facility];  Service: Cardiovascular;  Laterality: N/A;   LEFT HEART CATH AND CORS/GRAFTS ANGIOGRAPHY N/A 12/06/2020   Procedure: LEFT HEART CATH AND CORS/GRAFTS ANGIOGRAPHY;  Surgeon: Burnell Blanks, MD;  Location: [facility];  Service: Cardiovascular;  Laterality: N/A;   Percutaneous translumnial angioplasty  04/23/2009   Catheterization of [place]efst external iliac artery with Left lower extremity runoff   TEE WITHOUT CARDIOVERSION N/A 05/22/2015   Procedure: TRANSESOPHAGEAL  ECHOCARDIOGRAM (TEE);  Surgeon: [name], MD;  Location: [facility];  Service: Open Heart Surgery;  Laterality: N/A;    Current Outpatient Medications  Medication Sig Dispense Refill   amLODipine (NORVASC) 5 MG tablet Take 1 tablet (5 mg total) by mouth daily. 90 tablet 1   Ascorbic Acid (VITAMIN C) 1000 MG  tablet Take 1,000 mg by mouth in the morning.     aspirin EC 81 MG tablet Take 1 tablet (81 mg total) by mouth daily.     cholecalciferol (VITAMIN D) 25 MCG (1000 UNIT) tablet Take 1,000 Units by mouth in the morning.     ezetimibe (ZETIA) 10 MG tablet TAKE 1 TABLET BY MOUTH EVERY DAY 90 tablet 3   famotidine (PEPCID) 20 MG tablet Take 20 mg by mouth every evening.     fish oil-omega-3 fatty acids 1000 MG capsule Take 1,000 mg by mouth in the morning.     fluticasone (FLONASE) 50 MCG/ACT nasal spray Place 2 sprays into both nostrils daily. 16 g 6   folic acid (FOLVITE) 573 MCG tablet Take 400 mcg by mouth in the morning.     Garlic 2202 MG TBEC Take 2,000 mg by mouth in the morning.     isosorbide mononitrate (IMDUR) 60 MG 24 hr tablet Take 1 tablet (60 mg total) by mouth daily. 90 tablet 3   metoprolol tartrate (LOPRESSOR) 50 MG tablet Take 1 tablet (50 mg total) by mouth 2 (two) times daily. 180 tablet 3   Multiple Vitamin (MULTIVITAMIN WITH MINERALS) TABS tablet Take 1 tablet by mouth in the morning.     nitroGLYCERIN (NITROSTAT) 0.4 MG SL tablet Place 1 tablet (0.4 mg total) under the tongue every 5 (five) minutes as needed for chest pain. If pain does not resolve with rest. 50 tablet 3   omega-3 acid ethyl esters (LOVAZA) 1 g capsule Take 1 capsule (1 g total) by mouth daily. 60 capsule 2   omeprazole (PRILOSEC OTC) 20 MG tablet Take 1 tablet (20 mg total) by mouth daily. Take 30-60 min before first meal of the day     rosuvastatin (CRESTOR) 10 MG tablet TAKE 1 TABLET BY MOUTH EVERY DAY N 90 tablet 3   thiamine (VITAMIN B-1) 100 MG tablet Take 100 mg by mouth every evening.      thyroid (NP THYROID) 30 MG tablet Take 1 tablet (30 mg total) by mouth daily before breakfast. 90 tablet 1   ticagrelor (BRILINTA) 90 MG TABS tablet Take 1 tablet (90 mg total) by mouth 2 (two) times daily. 60 tablet 11   valsartan (DIOVAN) 160 MG tablet Take 1 tablet (160 mg total) by mouth daily. 30 tablet 0   No  current facility-administered medications for this visit.   Allergies:  Patient has no known allergies.   ROS: No palpitations or syncope.  Physical Exam: VS:  BP (!) 150/74 (BP Location: Right Arm, Patient Position: Sitting, Cuff Size: Normal)   Pulse 78   Ht '5\' 6"'$  (1.676 m)   Wt 152 lb (68.9 kg)   SpO2 96%   BMI 24.53 kg/m , BMI Body mass index is 24.53 kg/m.  Wt Readings from Last 3 Encounters:  12/05/21 152 lb (68.9 kg)  11/28/21 150 lb 8 oz (68.3 kg)  11/14/21 151 lb 3.2 oz (68.6 kg)    General: Patient appears comfortable at rest. HEENT: Conjunctiva and lids normal. Neck: Supple, no elevated JVP or carotid bruits, no thyromegaly. Lungs: Clear to auscultation, nonlabored breathing  at rest. Cardiac: Regular rate and rhythm, no S3, 1/6 systolic murmur. Extremities: No pitting edema.  ECG:  An ECG dated 12/21/2020 was personally reviewed today and demonstrated:  Sinus rhythm.  Recent Labwork: 05/31/2021: ALT 23; AST 27 11/14/2021: BUN 25; Creatinine, Ser 1.42; Hemoglobin 11.1; Platelets 320; Potassium 4.5; Sodium 140 11/28/2021: TSH 1.67     Component Value Date/Time   CHOL 96 12/22/2020 0150   TRIG 130 12/22/2020 0150   HDL 31 (L) 12/22/2020 0150   CHOLHDL 3.1 12/22/2020 0150   VLDL 26 12/22/2020 0150   LDLCALC 39 12/22/2020 0150   LDLCALC 81 07/04/2020 1018    Other Studies Reviewed Today:  Cardiac catheterization 12/06/2020:   Dist RCA lesion is 70% stenosed.   Ost LM to Mid LM lesion is 99% stenosed.   Ost Cx to Prox Cx lesion is 60% stenosed.   Origin to Prox Graft lesion is 100% stenosed.   Prox LAD lesion is 100% stenosed.   Mid RCA-1 lesion is 99% stenosed.   Mid RCA-2 lesion is 99% stenosed.   Scoring balloon angioplasty was performed using a BALLOON SCOREFLEX 3.0X15.   Post intervention, there is a 80% residual stenosis.   Post intervention, there is a 30% residual stenosis.   SVG graft was visualized by angiography.   LIMA graft was not visualized.    The graft exhibits no disease.   The left ventricular systolic function is normal.   LV end diastolic pressure is normal.   The left ventricular ejection fraction is 55-65% by visual estimate.   There is no mitral valve regurgitation.   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 The distal LAD is seen to fill from right to left collaterals. 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 It is presumed that there is not adequate flow into the LIMA graft given the CTO of the left subclavian artery.  The proximal Circumflex has a moderate stenosis. The vein graft to the obtuse marginal branch is patent.  The RCA is a large dominant artery with serial severe, calcified mid and distal lesions.  Balloon angioplasty and scoring balloon angioplasty of the mid RCA lesions. Unable to deliver stents to the mid RCA lesions despite adequate standard balloon and scoring balloon expansion.  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 Scot Dock. This supplies the LIMA graft which I could not visualize today due to the occlusion of the subclavian artery. I suspect that his RCA disease in the culprit for his recent angina. Attempted PCI today but will need to bring him back in 2 weeks for orbital atherectomy and stenting of the RCA. I have set this up for 12/21/20 at 7:30 am. I have spoken to the [employer] rep who can be here that day. He will continue on ASA and Brilinta. Monitor overnight on telemetry. Plan d/c home tomorrow on ASA/Brilinta if stable.    PCI 12/21/2020:   Mid RCA-1 lesion is 80% stenosed.   Mid RCA-2 lesion is 30% stenosed.   Dist RCA lesion is 70% stenosed.   Prox RCA lesion is 65%  stenosed.   A stent was successfully placed.   A stent was successfully placed.   A  stent was successfully placed.   Post intervention, there is a 0% residual stenosis.   Post intervention, there is a 0% residual stenosis.   Post intervention, there is a 0% residual stenosis.   Post intervention, there is a 0% residual stenosis.    PCI of proximal to distal RCA with three overlapping drug-eluting stents using orbital athrectomy.  A small perforation was seen in the mid vessel that did not improve with prolonged balloon inflation and the patient was stable throughout the entire procedure.  Limited echocardiographic images at the conclusion of the case demonstrated no significant effusion.  The patient will be observed overnight and repeat echocardiogram obtained.   Echocardiogram 10/16/2021:  1. Left ventricular ejection fraction, by estimation, is 65 to 70%. The  left ventricle has normal function. The left ventricle has no regional  wall motion abnormalities. There is mild left ventricular hypertrophy.  Left ventricular diastolic parameters  are consistent with Grade I diastolic dysfunction (impaired relaxation).  The average left ventricular global longitudinal strain is -19.9 %. The  global longitudinal strain is normal.   2. Right ventricular systolic function is normal. The right ventricular  size is normal. Tricuspid regurgitation signal is inadequate for assessing  PA pressure.   3. Left atrial size was mildly dilated.   4. The mitral valve is grossly normal. Trivial mitral valve  regurgitation.   5. The aortic valve is tricuspid. There is mild calcification of the  aortic valve. Aortic valve regurgitation is mild. Aortic valve  sclerosis/calcification is present, without any evidence of aortic  stenosis. Aortic regurgitation PHT measures 786 msec.   6. The inferior vena cava is normal in size with greater than 50%  respiratory variability, suggesting right atrial pressure of 3  mmHg.   Assessment and Plan:  1.  Severe multivessel CAD status post CABG with subsequently documented graft disease and limited revascularization options.  He is status post DES x3 overlapping within the proximal to distal RCA in September 2022.  Left system disease was more difficult to evaluate and managed medically.  He is nearing one year treatment course with DAPT.  Continue aspirin, Brilinta, Diovan, Crestor, Lovaza, Lopressor, Imdur, Zetia, and Norvasc.  ECG reviewed and stable.  2.  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  He has had aggressive LDL control at 39.  Medication Adjustments/Labs and Tests Ordered: Current medicines are reviewed at length with the patient today.  Concerns regarding medicines are outlined above.   Tests Ordered: Orders Placed This Encounter  Procedures   EKG 12-Lead    Medication Changes: No orders of the defined types were placed in this encounter.   Disposition:  Follow up  6 months.  Signed, Satira Sark, MD, [facility] 12/05/2021 11:02 AM    [facility] [place], [place], [place] [address] Phone: [phone]; Fax: [phone]

## 2021-12-05 ENCOUNTER — Encounter: Payer: Self-pay | Admitting: Cardiology

## 2021-12-05 ENCOUNTER — Ambulatory Visit: Payer: Medicare Other | Attending: Cardiology | Admitting: Cardiology

## 2021-12-05 VITALS — BP 150/74 | HR 78 | Ht 66.0 in | Wt 152.0 lb

## 2021-12-05 DIAGNOSIS — E782 Mixed hyperlipidemia: Secondary | ICD-10-CM | POA: Diagnosis not present

## 2021-12-05 DIAGNOSIS — I25119 Atherosclerotic heart disease of native coronary artery with unspecified angina pectoris: Secondary | ICD-10-CM

## 2021-12-05 NOTE — Patient Instructions (Signed)

## 2021-12-10 NOTE — Patient Instructions (Incomplete)
Please continue using your CPAP regularly. While your insurance requires that you use CPAP at least 4 hours each night on 70% of the nights, I recommend, that you not skip any nights and use it throughout the night if you can. Getting used to CPAP and staying with the treatment long term does take time and patience and discipline. Untreated obstructive sleep apnea when it is moderate to severe can have an adverse impact on cardiovascular health and raise her risk for heart disease, arrhythmias, hypertension, congestive heart failure, stroke and diabetes. Untreated obstructive sleep apnea causes sleep disruption, nonrestorative sleep, and sleep deprivation. This can have an impact on your day to day functioning and cause daytime sleepiness and impairment of cognitive function, memory loss, mood disturbance, and problems focussing. Using CPAP regularly can improve these symptoms.  Try working on improving compliance. I will send orders to [facility] for a mask refitting. Make sure to call them if you haven't heard from them in 2-3 days.   DME: [facility] Phone: [phone], press option 1  Follow up with me in 4 months

## 2021-12-10 NOTE — Progress Notes (Unsigned)
PATIENT: William [name]. DOB: [birth year]  REASON FOR VISIT: follow up HISTORY FROM: patient  No chief complaint on file.    HISTORY OF PRESENT ILLNESS:  12/10/21 ALL: William [name] returns for follow up for OSA on CPAP. He continues to do well on therapy.   12/11/2020 ALL: William [name] returns for follow up for OSA on CPAP. He is doing well on therapy. He denies concerns with machine or supplies. Facial hari continues to contribute to large air leak. AHI is well managed and he is sleeping well.   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 Cardiology was unable to place stents. He also has left subclavian occlusion, followed by Dr Scot Dock, vascular. He has planned orbital atherectomy and stenting of RCA on 12/21/2020. He continues asa and Brilinta. He was referred to PharmD for lipid management.     12/08/2019 ALL: William [name]. is a 81 y.o. male here today for follow up for OSA on CPAP.  He continues to do well with CPAP therapy.  He does note significant improvement in daytime energy.  He continues to note a leak on his mask.  He feels that nasal pillows are the best fit.  Facial hair is most likely contributing to an adequate mask seal.  Otherwise he is doing very well.  Compliance report dated 11/07/2019 through 12/06/2019 reveals that he used CPAP 29 of the past 30 days for compliance of 97%.  He used CPAP greater than 4 hours 28 of the past 30 days for compliance of 93%.  Average usage on days used was 8 hours and 25 minutes.  Residual AHI was 2.6 on a set pressure of 10 cm of water.  Leak continues to be elevated in the 95th percentile of 53.2 L/min.  HISTORY: (copied from my note on 03/21/2019)  William [name]. is a 81 y.o. male here today for follow up for OSA on CPAP. He reports that he is doing very well with CPAP therapy. He is using CPAP nightly. He does have a full mustache and  beard. He feels that this is why he continues to note a leak. He feels that he has his mask and headgear as tight as he can tolerate.  He does note that he wakes up most mornings around 3:57 AM.  He states that he can sit on the side of the bed for a few minutes and drifts right back off to sleep.  He denies any concerns of nocturia.  He has no trouble with insomnia.  He feels well rested when he wakes.   Compliance report dated 02/15/2019 through 03/16/2019 reveals that he has used CPAP 30 out of the last 30 days for compliance of 100%.  29 of the last 30 days he used CPAP greater than 4 hours for compliance of 97%.  Average usage was 7 hours and 23 minutes.  Residual AHI was 1.6 on a set pressure of 10 cm of water and an EPR of 1 leak noted in the 95th percentile of 43.1.     HISTORY: (copied from Dr Guadelupe Sabin note on 07/13/2018)   William [name] is a 81 year old right-handed gentleman with an underlying complex medical history of peripheral vascular disease with status post iliac stenting, coronary artery disease with status post CABG in 2017, hypertension, vitamin D deficiency, COPD, history of non-STEMI, hyperlipidemia, carotid artery disease, and overweight state, with  whom I am conducting a virtual, phone based follow-up appointment today in lieu of a face-to-face visit for follow-up consultation of his obstructive sleep apnea after interim sleep study testing and starting CPAP therapy. The patient is unaccompanied today and joins from home. I first met him on 02/23/2018 at the request of his primary care physician, Dr. Anastasio Champion, at which time he reported snoring and excessive daytime somnolence. He was advised to proceed with sleep study testing. I went over his test results with him today. He had a baseline sleep study on 04/05/2018 which showed a sleep latency delayed, REM latency 56 minutes, sleep efficiency reduced at 50.6%, he had absence of slow-wave sleep and REM sleep was 17.8%, total AHI 24.8 per  hour, supine AHI 60.4 per hour and REM AHI 52.5 per hour. Average oxygen saturation 92%, nadir of 81%. He had severe PLMS with mild arousals. He was invited for a CPAP titration study. This was on 04/21/2018. Sleep efficiency 57.6%, sleep latency delayed, REM latency 140 minutes. He had near absence of slow-wave sleep and REM sleep was normal at 22.7%. CPAP was titrated from 5 cm to 9 cm. On the final pressure his AHI was 0.5 per hour, O2 nadir of 89% with supine REM sleep achieved. Based on his test results I prescribed CPAP therapy for home use at a pressure of 10 cm.   He also had an interim brain MRI in December 2019 because of his report of intermittent diplopia. He had the study in [place], [place], brain MRI with and without contrast showed periventricular white matter changes, no abnormal enhancement.   Today, 07/13/2018: Please also see below for virtual visit documentation.   I reviewed his CPAP compliance data from 06/12/2018 through 07/11/2018 which is a total of 30 days, during which time he used his machine every night with percent used days greater than 4 hours at 90%, indicating excellent compliance with an average usage of 5 hours and 39 minutes, residual AHI at goal at 2.3 per hour, leak high with the 95th percentile at 42.2 L/m on a pressure of 10 cm.   Previously:   02/23/2018: (He) reports snoring and excessive daytime somnolence. I reviewed your office note from 01/05/2018, when he saw Jeralyn Ruths, nurse practitioner. His Epworth sleepiness score is 8 out of 24 today, fatigue score is 32 out of 63. He is widowed and lives alone, he has 2 children. 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 He drinks caffeine in the form of coffee, 2 per day on average. He had a BMP checked at your office on 01/19/2018 and I reviewed the results: BUN  was 26, creatinine 0.94. He is trying to hydrate better. He reports a history of double vision for the past 5 years, symptoms have been infrequent, very brief, less than a minute at a time but have increased in frequency, maybe averaging once a month whereas he may have had double vision once every few months in the past. He has seen ophthalmology for this. He denies any one-sided weakness or numbness or stroke like symptoms in the past. He is followed by cardiology. He was encouraged to consider a sleep study by his cardiologist as well in the past. He has never had a sleep study. He does snore loudly. He is not aware of any  family history of OSA. His bedtime is around 8:30 and rise time around 3:30. He has nocturia about twice per average night and denies morning headaches.   REVIEW OF SYSTEMS: Out of a complete 14 system review of symptoms, the patient complains only of the following symptoms, shortness of breath, chest pain  and all other reviewed systems are negative.  ESS: 2   ALLERGIES: No Known Allergies  HOME MEDICATIONS: Outpatient Medications Prior to Visit  Medication Sig Dispense Refill   amLODipine (NORVASC) 5 MG tablet Take 1 tablet (5 mg total) by mouth daily. 90 tablet 1   Ascorbic Acid (VITAMIN C) 1000 MG tablet Take 1,000 mg by mouth in the morning.     aspirin EC 81 MG tablet Take 1 tablet (81 mg total) by mouth daily.     cholecalciferol (VITAMIN D) 25 MCG (1000 UNIT) tablet Take 1,000 Units by mouth in the morning.     ezetimibe (ZETIA) 10 MG tablet TAKE 1 TABLET BY MOUTH EVERY DAY 90 tablet 3   famotidine (PEPCID) 20 MG tablet Take 20 mg by mouth every evening.     fish oil-omega-3 fatty acids 1000 MG capsule Take 1,000 mg by mouth in the morning.     fluticasone (FLONASE) 50 MCG/ACT nasal spray Place 2 sprays into both nostrils daily. 16 g 6   folic acid (FOLVITE) 944 MCG tablet Take 400 mcg by mouth in the morning.     Garlic 9675 MG TBEC Take 2,000 mg by mouth in the  morning.     isosorbide mononitrate (IMDUR) 60 MG 24 hr tablet Take 1 tablet (60 mg total) by mouth daily. 90 tablet 3   metoprolol tartrate (LOPRESSOR) 50 MG tablet Take 1 tablet (50 mg total) by mouth 2 (two) times daily. 180 tablet 3   Multiple Vitamin (MULTIVITAMIN WITH MINERALS) TABS tablet Take 1 tablet by mouth in the morning.     nitroGLYCERIN (NITROSTAT) 0.4 MG SL tablet Place 1 tablet (0.4 mg total) under the tongue every 5 (five) minutes as needed for chest pain. If pain does not resolve with rest. 50 tablet 3   omega-3 acid ethyl esters (LOVAZA) 1 g capsule Take 1 capsule (1 g total) by mouth daily. 60 capsule 2   omeprazole (PRILOSEC OTC) 20 MG tablet Take 1 tablet (20 mg total) by mouth daily. Take 30-60 min before first meal of the day     rosuvastatin (CRESTOR) 10 MG tablet TAKE 1 TABLET BY MOUTH EVERY DAY N 90 tablet 3   thiamine (VITAMIN B-1) 100 MG tablet Take 100 mg by mouth every evening.      thyroid (NP THYROID) 30 MG tablet Take 1 tablet (30 mg total) by mouth daily before breakfast. 90 tablet 1   ticagrelor (BRILINTA) 90 MG TABS tablet Take 1 tablet (90 mg total) by mouth 2 (two) times daily. 60 tablet 11   valsartan (DIOVAN) 160 MG tablet TAKE 1 TABLET BY MOUTH EVERY DAY 30 tablet 0   No facility-administered medications prior to visit.    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[facility])    Myocardial infarction ([place])    Peripheral arterial disease (  [place])    Suboptimal revascularization options    PAST SURGICAL HISTORY: Past Surgical History:  Procedure Laterality Date   ABDOMINAL AORTOGRAM W/LOWER EXTREMITY N/A 08/21/2017   Procedure: ABDOMINAL AORTOGRAM W/LOWER EXTREMITY;  Surgeon:  Angelia Mould, MD;  Location: [facility];  Service: Cardiovascular;  Laterality: N/A;   Bilateral renal  artery stenoses  04/23/2009   CARDIAC CATHETERIZATION N/A 05/21/2015   Procedure: Left Heart Cath and Coronary Angiography;  Surgeon: Lorretta Harp, MD;  Location: [facility];  Service: Cardiovascular;  Laterality: N/A;   CORONARY ARTERY BYPASS GRAFT N/A 05/22/2015   Procedure: CORONARY ARTERY BYPASS GRAFTING (CABG) LIMA-LAD and SVG-OM EVH RIGHT THIGH GREATER SAPHENOUS VEIN;  Surgeon: [name], MD;  Location: [facility];  Service: Open Heart Surgery;  Laterality: N/A;   CORONARY ATHERECTOMY N/A 12/21/2020   Procedure: CORONARY ATHERECTOMY;  Surgeon: Burnell Blanks, MD;  Location: [facility];  Service: Cardiovascular;  Laterality: N/A;   CORONARY BALLOON ANGIOPLASTY N/A 12/06/2020   Procedure: CORONARY BALLOON ANGIOPLASTY;  Surgeon: Burnell Blanks, MD;  Location: [facility];  Service: Cardiovascular;  Laterality: N/A;   CORONARY STENT INTERVENTION N/A 12/21/2020   Procedure: CORONARY STENT INTERVENTION;  Surgeon: Burnell Blanks, MD;  Location: [facility];  Service: Cardiovascular;  Laterality: N/A;   HIATAL HERNIA REPAIR     INTRAVASCULAR ULTRASOUND/IVUS N/A 12/21/2020   Procedure: Intravascular Ultrasound/IVUS;  Surgeon: Burnell Blanks, MD;  Location: [facility];  Service: Cardiovascular;  Laterality: N/A;   LEFT HEART CATH AND CORS/GRAFTS ANGIOGRAPHY N/A 12/06/2020   Procedure: LEFT HEART CATH AND CORS/GRAFTS ANGIOGRAPHY;  Surgeon: Burnell Blanks, MD;  Location: [facility];  Service: Cardiovascular;  Laterality: N/A;   Percutaneous translumnial angioplasty  04/23/2009   Catheterization of [place] external iliac artery with Left lower extremity runoff   TEE WITHOUT CARDIOVERSION N/A 05/22/2015   Procedure: TRANSESOPHAGEAL ECHOCARDIOGRAM (TEE);  Surgeon: [name], MD;  Location: [facility];  Service: Open Heart Surgery;  Laterality: N/A;    FAMILY HISTORY: Family History  Problem Relation Age of Onset   Heart disease Father        Heart Disease before age 27   Pulmonary embolism Father    Deep vein thrombosis Father    Cancer Mother        Sarcoma    SOCIAL HISTORY: Social History   Socioeconomic History   Marital status: Married    Spouse name: Not on file   Number of children: Not on file   Years of education: Not on file   Highest education level: Not on file  Occupational History   Not on file  Tobacco Use   Smoking status: Former    Packs/day: 2.00    Years: 30.00    Total pack years: 60.00    Types: Cigarettes    Start date: 06/03/1961    Quit date: 11/17/1991    Years since quitting: 30.0    Passive exposure: Never   Smokeless tobacco: Former    Types: Snuff, Chew  Vaping Use   Vaping Use: Never used  Substance and Sexual Activity   Alcohol use: Not Currently    Alcohol/week: 2.0 - 4.0 standard drinks of alcohol    Types: 2 - 4 Standard drinks or equivalent per week    Comment: used to, quit 2012   Drug use: No   Sexual activity: Not Currently  Other Topics Concern  Not on file  Social History Narrative   Retired used to work in [employer]. Widower. Lives alone.   Social Determinants of Health   Financial Resource Strain: Not on file  Food Insecurity: Not on file  Transportation Needs: Not on file  Physical Activity: Not on file  Stress: Not on file  Social Connections: Not on file  Intimate Partner Violence: Not on file      PHYSICAL EXAM  There were no vitals filed for this visit.  There is no height or weight on file to calculate BMI.  Generalized: Well developed, in no acute distress  Cardiology: normal rate and rhythm, no murmur noted Respiratory: clear to auscultation bilaterally  Neurological examination  Mentation: Alert oriented to time, place, history taking. Follows all commands speech and language  fluent Cranial nerve II-XII: Pupils were equal round reactive to light. Extraocular movements were full, visual field were full  Motor: The motor testing reveals 5 over 5 strength of all 4 extremities. Good symmetric motor tone is noted throughout.  Gait and station: Gait is normal.    DIAGNOSTIC DATA (LABS, IMAGING, TESTING) - I reviewed patient records, labs, notes, testing and imaging myself where available.      No data to display           Lab Results  Component Value Date   WBC 8.9 11/14/2021   HGB 11.1 (L) 11/14/2021   HCT 32.9 (L) 11/14/2021   MCV 94 11/14/2021   PLT 320 11/14/2021      Component Value Date/Time   NA 140 11/14/2021 1353   K 4.5 11/14/2021 1353   CL 106 11/14/2021 1353   CO2 16 (L) 11/14/2021 1353   GLUCOSE 87 11/14/2021 1353   GLUCOSE 91 05/31/2021 1218   BUN 25 11/14/2021 1353   CREATININE 1.42 (H) 11/14/2021 1353   CREATININE 1.06 07/04/2020 1018   CALCIUM 8.9 11/14/2021 1353   PROT 7.0 05/31/2021 1218   ALBUMIN 3.7 05/31/2021 1218   AST 27 05/31/2021 1218   ALT 23 05/31/2021 1218   ALKPHOS 43 05/31/2021 1218   BILITOT 0.5 05/31/2021 1218   GFRNONAA 52 (L) 05/31/2021 1218   GFRNONAA 66 07/04/2020 1018   GFRAA 77 07/04/2020 1018   Lab Results  Component Value Date   CHOL 96 12/22/2020   HDL 31 (L) 12/22/2020   LDLCALC 39 12/22/2020   TRIG 130 12/22/2020   CHOLHDL 3.1 12/22/2020   Lab Results  Component Value Date   HGBA1C 6.0 (H) 05/21/2015   No results found for: "VITAMINB12" Lab Results  Component Value Date   TSH 1.67 11/28/2021       ASSESSMENT AND PLAN 81 y.o. 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 here with   No diagnosis found.     Compliance report reveals  excellent compliance.  He does continue to have significant leak most likely related to facial hair.  He is happy with the fit of his mask and does not feel that a mask refitting will be beneficial. He was encouraged to continue monitoring for correct mask seal at home.  He will notify me of any concerns of elevated apneic events as indicated on his CPAP machine.   He was encouraged to continue healthy lifestyle habits.  He will continue close follow-up with cardiology, vascular  and primary care as directed.  He will follow-up with me in 1 year, sooner if needed.  He verbalizes understanding and agreement with this plan.  No orders of the defined types were placed in this encounter.     No orders of the defined types were placed in this encounter.     Debbora Presto, FNP-C 12/10/2021, 1:56 PM [facility] [address] [place], [place] [address] [phone]

## 2021-12-11 ENCOUNTER — Encounter: Payer: Self-pay | Admitting: Family Medicine

## 2021-12-11 ENCOUNTER — Ambulatory Visit (INDEPENDENT_AMBULATORY_CARE_PROVIDER_SITE_OTHER): Payer: Medicare Other | Admitting: Family Medicine

## 2021-12-11 VITALS — BP 147/59 | HR 64 | Ht 66.0 in | Wt 150.5 lb

## 2021-12-11 DIAGNOSIS — Z9989 Dependence on other enabling machines and devices: Secondary | ICD-10-CM | POA: Diagnosis not present

## 2021-12-11 DIAGNOSIS — G4733 Obstructive sleep apnea (adult) (pediatric): Secondary | ICD-10-CM

## 2021-12-12 ENCOUNTER — Telehealth: Payer: Self-pay | Admitting: Neurology

## 2021-12-27 ENCOUNTER — Ambulatory Visit: Payer: Medicare Other

## 2021-12-30 ENCOUNTER — Ambulatory Visit (INDEPENDENT_AMBULATORY_CARE_PROVIDER_SITE_OTHER): Payer: Medicare Other | Admitting: Internal Medicine

## 2021-12-30 ENCOUNTER — Ambulatory Visit (INDEPENDENT_AMBULATORY_CARE_PROVIDER_SITE_OTHER): Payer: Medicare Other

## 2021-12-30 ENCOUNTER — Encounter: Payer: Self-pay | Admitting: Internal Medicine

## 2021-12-30 VITALS — BP 130/60 | HR 61 | Temp 98.2°F | Ht 66.0 in | Wt 152.4 lb

## 2021-12-30 DIAGNOSIS — Z23 Encounter for immunization: Secondary | ICD-10-CM | POA: Diagnosis not present

## 2021-12-30 DIAGNOSIS — J449 Chronic obstructive pulmonary disease, unspecified: Secondary | ICD-10-CM | POA: Diagnosis not present

## 2021-12-30 DIAGNOSIS — I25119 Atherosclerotic heart disease of native coronary artery with unspecified angina pectoris: Secondary | ICD-10-CM | POA: Diagnosis not present

## 2021-12-30 DIAGNOSIS — Z0000 Encounter for general adult medical examination without abnormal findings: Secondary | ICD-10-CM

## 2021-12-30 DIAGNOSIS — J439 Emphysema, unspecified: Secondary | ICD-10-CM | POA: Diagnosis not present

## 2021-12-30 LAB — PULMONARY FUNCTION TEST
DL/VA % pred: 58 %
DL/VA: 2.35 ml/min/mmHg/L
DLCO cor % pred: 55 %
DLCO cor: 11.19 ml/min/mmHg
DLCO unc % pred: 49 %
DLCO unc: 9.91 ml/min/mmHg
FEF 25-75 Post: 1.05 L/sec
FEF 25-75 Pre: 0.66 L/sec
FEF2575-%Change-Post: 60 %
FEF2575-%Pred-Post: 73 %
FEF2575-%Pred-Pre: 45 %
FEV1-%Change-Post: 17 %
FEV1-%Pred-Post: 84 %
FEV1-%Pred-Pre: 72 %
FEV1-Post: 1.81 L
FEV1-Pre: 1.54 L
FEV1FVC-%Change-Post: 3 %
FEV1FVC-%Pred-Pre: 76 %
FEV6-%Change-Post: 14 %
FEV6-%Pred-Post: 109 %
FEV6-%Pred-Pre: 95 %
FEV6-Post: 3.07 L
FEV6-Pre: 2.69 L
FEV6FVC-%Change-Post: 0 %
FEV6FVC-%Pred-Post: 104 %
FEV6FVC-%Pred-Pre: 103 %
FVC-%Change-Post: 13 %
FVC-%Pred-Post: 104 %
FVC-%Pred-Pre: 92 %
FVC-Post: 3.2 L
FVC-Pre: 2.83 L
Post FEV1/FVC ratio: 56 %
Post FEV6/FVC ratio: 96 %
Pre FEV1/FVC ratio: 55 %
Pre FEV6/FVC Ratio: 95 %
RV % pred: 153 %
RV: 3.57 L
TLC % pred: 112 %
TLC: 6.55 L

## 2021-12-30 MED ORDER — BUDESONIDE-FORMOTEROL FUMARATE 80-4.5 MCG/ACT IN AERO: INHALATION_SPRAY | RESPIRATORY_TRACT | 11 refills | Status: DC

## 2021-12-30 MED ORDER — BREZTRI AEROSPHERE 160-9-4.8 MCG/ACT IN AERO: 2.0000 | INHALATION_SPRAY | Freq: Two times a day (BID) | RESPIRATORY_TRACT | 0 refills | Status: DC

## 2021-12-30 NOTE — Progress Notes (Signed)
William [name]., male    DOB: 11/24/1940,    MRN: [id number]   Brief patient profile:  42 yowm quit smoking 1993/MM  with GOLD 2 COPD  criteria 05/2015 while on spiriva with reported doe = MMRC 1  but did not  think the spiriva dpi is working as well and also limited by claudication and sometimes ex cp with known IHD so referred to pulmonary clinic in [place]  10/11/2020 by Dr  Domenic Polite     History of Present Illness  10/11/2020  Pulmonary/ 1st office eval/ [facility] Carre[facility] / [name]  Chief Complaint  Patient presents with   Consult    Patient has COPD and patient states that he has been having trouble with his heart and was sent here to make sure his lungs are ok. Wife died of lung cancer. Shortness of breath with exertion. Takes Spiriva  Dyspnea:  legs usually stop him 1st x 100 ft  flat and slow    Cough: not much Sleep: flat bed/ one pillow  SABA use: none  Rec Stop spiriva and start Stiolto 2 puffs first thing in Am x 2 weeks then try 1 puff daily x 1 weeks then try nothing to see if it really make a difference Work on inhaler technique: If worse chest pains with activity on the stiolto 2 puffs, can try one puff instead but if it persists, stop stiolto If even the 2 puffs doesn't help your breathing , you need a trial off lisinopril 40 and on benicar 40  for at least 4 weeks - call if you want to try it and I'll call it in for you.     09/20/2021  f/u ov/[place] office/Kimeka Badour re: GOLD 2 / ?PF  maint on no rx  Chief Complaint  Patient presents with   Follow-up    SOB is about the same since last ov.   Dyspnea:  says breathing not been right since stents despite rehab  Cough: mostly throat clearing  Sleeping: cpap at hs / once a week wakes up slt bloody sputum  SABA use: none  02: none  Covid status: "all of em" Rec Resume stiolto 2 puffs each am  until return  Keep up the walking as much as you can to see if stiolto helps (like high octane fuel should help your  engine) Try prilosec otc (omepazole) 20 mg  Take 30-60 min before first meal of the day and Pepcid ac (famotidine) 20 mg one @  bedtime until return.          11/14/2021  f/u ov/[place] office/Caydee Talkington re: GOLD 2/ hbp with cri  maint on nothing   Chief Complaint  Patient presents with   Follow-up    Follow up after lab result change bp med. 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  Cough: still clearing throat mostly just spit and assoc dysphagia  Sleeping: cpap SABA use: none  02: none  Covid status: never had it / vax x all  No urinary symptoms Rec Continue to take omeprazole  otc (omeprazole) '20mg'$   Take 30-60 min before first meal of the day and Pepcid ac (famotidine) 20 mg one @  bedtime until cough is completely gone for at least a week without the need for cough suppression GERD  diet Contact Dr Pearline Cables for GI and ENT evaluation in [facility] for your throat and swallowing  problems  - call me if you need my office to help  Please remember to go to the [facility]  -  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 Breathing is unchanged since the last visit. No new co's.   Dyspnea:  mailbox 250 ft and struggle to get back without stopping up slt incline Cough: none  Sleeping: cpap bed blocks SABA use: none  02: none  Covid status:   vax max    No obvious day to day or daytime variability or assoc excess/ purulent sputum or mucus plugs or hemoptysis or cp or chest tightness, subjective wheeze or overt sinus or hb symptoms.   Sleepoing  without nocturnal  or early am exacerbation  of respiratory  c/o's or need for noct saba. Also denies any obvious fluctuation of symptoms with weather or environmental changes or other aggravating or alleviating factors except as outlined  above   No unusual exposure hx or h/o childhood pna/ asthma or knowledge of premature birth.  Current Allergies, Complete Past Medical History, Past Surgical History, Family History, and Social History were reviewed in [employer] record.  ROS  The following are not active complaints unless bolded Hoarseness, sore throat, dysphagia = slt globus sensation, dental problems, itching, sneezing,  nasal congestion or discharge of excess mucus or purulent secretions, ear ache,   fever, chills, sweats, unintended wt loss or wt gain, classically pleuritic or exertional cp,  orthopnea pnd or arm/hand swelling  or leg swelling, presyncope, palpitations, abdominal pain, anorexia, nausea, vomiting, diarrhea  or change in bowel habits or change in bladder habits, change in stools or change in urine, dysuria, hematuria,  rash, arthralgias, visual complaints, headache, numbness, weakness or ataxia or problems with walking or coordination,  change in mood or  memory.        Current Meds  Medication Sig   amLODipine (NORVASC) 5 MG tablet Take 1 tablet (5 mg total) by mouth daily.   Ascorbic Acid (VITAMIN C) 1000 MG tablet Take 1,000 mg by mouth in the morning.   aspirin EC 81 MG tablet Take 1 tablet (81 mg total) by mouth daily.   cholecalciferol (VITAMIN D) 25 MCG (1000 UNIT) tablet Take 1,000 Units by mouth in the morning.   ezetimibe (ZETIA) 10 MG tablet TAKE 1 TABLET BY MOUTH EVERY DAY   famotidine (PEPCID) 20 MG tablet Take 20 mg by mouth every evening.   fish oil-omega-3 fatty acids 1000 MG capsule Take 1,000 mg by mouth in the morning.   fluticasone (FLONASE) 50 MCG/ACT nasal spray Place 2 sprays into both nostrils daily.   folic acid (FOLVITE) 952 MCG tablet Take 400 mcg by mouth in the morning.   Garlic 8413 MG TBEC Take 2,000 mg by mouth in the morning.   isosorbide mononitrate (IMDUR) 60 MG 24 hr tablet Take 1 tablet (60 mg total) by mouth daily.   metoprolol tartrate  (LOPRESSOR) 50 MG tablet Take 1 tablet (50 mg total) by mouth 2 (two) times daily.   Multiple Vitamin (MULTIVITAMIN WITH MINERALS) TABS tablet Take 1 tablet by mouth in the morning.   nitroGLYCERIN (NITROSTAT) 0.4 MG SL tablet Place 1 tablet (0.4 mg total) under the tongue every 5 (five) minutes as needed for chest pain. If pain does not  resolve with rest.   omega-3 acid ethyl esters (LOVAZA) 1 g capsule Take 1 capsule (1 g total) by mouth daily.   omeprazole (PRILOSEC OTC) 20 MG tablet Take 1 tablet (20 mg total) by mouth daily. Take 30-60 min before first meal of the day   rosuvastatin (CRESTOR) 10 MG tablet TAKE 1 TABLET BY MOUTH EVERY DAY N   thiamine (VITAMIN B-1) 100 MG tablet Take 100 mg by mouth every evening.    thyroid (NP THYROID) 30 MG tablet Take 1 tablet (30 mg total) by mouth daily before breakfast.   ticagrelor (BRILINTA) 90 MG TABS tablet Take 1 tablet (90 mg total) by mouth 2 (two) times daily.   valsartan (DIOVAN) 160 MG tablet TAKE 1 TABLET BY MOUTH EVERY DAY                           Objective:    Wts  12/30/2021       152  11/14/2021       151  11/01/2021         146  09/20/2021       156  03/26/2021     159   12/20/20 161 lb 1.9 oz (73.1 kg)  12/13/20 162 lb 9.6 oz (73.8 kg)  12/11/20 164 lb (74.4 kg)    Vital signs reviewed  12/30/2021  - Note at rest 02 sats  95% on RA   General appearance:    pleasant elderly stoic wm nad    HEENT : Oropharynx  clear  Nasal turbinates nl    NECK :  without  apparent JVD/ palpable Nodes/TM    LUNGS: no acc muscle use,  Min barrel  contour chest wall with bilateral  slightly decreased bs s audible wheeze and  without cough on insp or exp maneuvers and min  Hyperresonant  to  percussion bilaterally    CV:  RRR  no s3 or murmur or increase in P2, and no edema   ABD:  soft and nontender with pos end  insp Hoover's  in the supine position.  No bruits or organomegaly appreciated   MS:  Nl gait/ ext warm without  deformities Or obvious joint restrictions  calf tenderness, cyanosis or clubbing     SKIN: warm and dry without lesions    NEURO:  alert, approp, nl sensorium with  no motor or cerebellar deficits apparent.             Assessment

## 2021-12-30 NOTE — Progress Notes (Signed)
Full PFT Performed Today  

## 2021-12-30 NOTE — Patient Instructions (Addendum)
[name] , Thank you for taking time to come for your Medicare Wellness Visit. I appreciate your ongoing commitment to your health goals. Please review the following plan we discussed and let me know if I can assist you in the future.   These are the goals we discussed:  Goals      Blood Pressure < 140/90     Obtaining goal well! Keep it up!     GET BACK TO EXERCISING.        This is a list of the screening recommended for you and due dates:  Health Maintenance  Topic Date Due   Zoster (Shingles) Vaccine (1 of 2) Never done   Flu Shot  10/29/2021   Tetanus Vaccine  01/06/2028   Pneumonia Vaccine  Completed   HPV Vaccine  Aged Out   COVID-19 Vaccine  Discontinued    Advanced directives: YES  Conditions/risks identified: YES  Next appointment: Follow up in one year for your annual wellness visit.   Preventive Care 80 Years and Older, Male  Preventive care refers to lifestyle choices and visits with your health care provider that can promote health and wellness. What does preventive care include? A yearly physical exam. This is also called an annual well check. Dental exams once or twice a year. Routine eye exams. Ask your health care provider how often you should have your eyes checked. Personal lifestyle choices, including: Daily care of your teeth and gums. Regular physical activity. Eating a healthy diet. Avoiding tobacco and drug use. Limiting alcohol use. Practicing safe sex. Taking low doses of aspirin every day. Taking vitamin and mineral supplements as recommended by your health care provider. What happens during an annual well check? The services and screenings done by your health care provider during your annual well check will depend on your age, overall health, lifestyle risk factors, and family history of disease. Counseling  Your health care provider may ask you questions about your: Alcohol use. Tobacco use. Drug use. Emotional well-being. Home and  relationship well-being. Sexual activity. Eating habits. History of falls. Memory and ability to understand (cognition). Work and work Statistician. Screening  You may have the following tests or measurements: Height, weight, and BMI. Blood pressure. Lipid and cholesterol levels. These may be checked every 5 years, or more frequently if you are over 64 years old. Skin check. Lung cancer screening. You may have this screening every year starting at age 70 if you have a 30-pack-year history of smoking and currently smoke or have quit within the past 15 years. Fecal occult blood test (FOBT) of the stool. You may have this test every year starting at age 55. Flexible sigmoidoscopy or colonoscopy. You may have a sigmoidoscopy every 5 years or a colonoscopy every 10 years starting at age 28. Prostate cancer screening. Recommendations will vary depending on your family history and other risks. Hepatitis C blood test. Hepatitis B blood test. Sexually transmitted disease (STD) testing. Diabetes screening. This is done by checking your blood sugar (glucose) after you have not eaten for a while (fasting). You may have this done every 1-3 years. Abdominal aortic aneurysm (AAA) screening. You may need this if you are a current or former smoker. Osteoporosis. You may be screened starting at age 34 if you are at high risk. Talk with your health care provider about your test results, treatment options, and if necessary, the need for more tests. Vaccines  Your health care provider may recommend certain vaccines, such as: Influenza  vaccine. This is recommended every year. Tetanus, diphtheria, and acellular pertussis (Tdap, Td) vaccine. You may need a Td booster every 10 years. Zoster vaccine. You may need this after age 56. Pneumococcal 13-valent conjugate (PCV13) vaccine. One dose is recommended after age 70. Pneumococcal polysaccharide (PPSV23) vaccine. One dose is recommended after age 30. Talk to your  health care provider about which screenings and vaccines you need and how often you need them. This information is not intended to replace advice given to you by your health care provider. Make sure you discuss any questions you have with your health care provider. Document Released: 04/13/2015 Document Revised: 12/05/2015 Document Reviewed: 01/16/2015 Elsevier Interactive Patient Education  2017 [employer] Prevention in the Home Falls can cause injuries. They can happen to people of all ages. There are many things you can do to make your home safe and to help prevent falls. What can I do on the outside of my home? Regularly fix the edges of walkways and driveways and fix any cracks. Remove anything that might make you trip as you walk through a door, such as a raised step or threshold. Trim any bushes or trees on the path to your home. Use bright outdoor lighting. Clear any walking paths of anything that might make someone trip, such as rocks or tools. Regularly check to see if handrails are loose or broken. Make sure that both sides of any steps have handrails. Any raised decks and porches should have guardrails on the edges. Have any leaves, snow, or ice cleared regularly. Use sand or salt on walking paths during winter. Clean up any spills in your garage right away. This includes oil or grease spills. What can I do in the bathroom? Use night lights. Install grab bars by the toilet and in the tub and shower. Do not use towel bars as grab bars. Use non-skid mats or decals in the tub or shower. If you need to sit down in the shower, use a plastic, non-slip stool. Keep the floor dry. Clean up any water that spills on the floor as soon as it happens. Remove soap buildup in the tub or shower regularly. Attach bath mats securely with double-sided non-slip rug tape. Do not have throw rugs and other things on the floor that can make you trip. What can I do in the bedroom? Use night  lights. Make sure that you have a light by your bed that is easy to reach. Do not use any sheets or blankets that are too big for your bed. They should not hang down onto the floor. Have a firm chair that has side arms. You can use this for support while you get dressed. Do not have throw rugs and other things on the floor that can make you trip. What can I do in the kitchen? Clean up any spills right away. Avoid walking on wet floors. Keep items that you use a lot in easy-to-reach places. If you need to reach something above you, use a strong step stool that has a grab bar. Keep electrical cords out of the way. Do not use floor polish or wax that makes floors slippery. If you must use wax, use non-skid floor wax. Do not have throw rugs and other things on the floor that can make you trip. What can I do with my stairs? Do not leave any items on the stairs. Make sure that there are handrails on both sides of the stairs and use them. Fix  handrails that are broken or loose. Make sure that handrails are as long as the stairways. Check any carpeting to make sure that it is firmly attached to the stairs. Fix any carpet that is loose or worn. Avoid having throw rugs at the top or bottom of the stairs. If you do have throw rugs, attach them to the floor with carpet tape. Make sure that you have a light switch at the top of the stairs and the bottom of the stairs. If you do not have them, ask someone to add them for you. What else can I do to help prevent falls? Wear shoes that: Do not have high heels. Have rubber bottoms. Are comfortable and fit you well. Are closed at the toe. Do not wear sandals. If you use a stepladder: Make sure that it is fully opened. Do not climb a closed stepladder. Make sure that both sides of the stepladder are locked into place. Ask someone to hold it for you, if possible. Clearly mark and make sure that you can see: Any grab bars or handrails. First and last  steps. Where the edge of each step is. Use tools that help you move around (mobility aids) if they are needed. These include: Canes. Walkers. Scooters. Crutches. Turn on the lights when you go into a dark area. Replace any light bulbs as soon as they burn out. Set up your furniture so you have a clear path. Avoid moving your furniture around. If any of your floors are uneven, fix them. If there are any pets around you, be aware of where they are. Review your medicines with your doctor. Some medicines can make you feel dizzy. This can increase your chance of falling. Ask your doctor what other things that you can do to help prevent falls. This information is not intended to replace advice given to you by your health care provider. Make sure you discuss any questions you have with your health care provider. Document Released: 01/11/2009 Document Revised: 08/23/2015 Document Reviewed: 04/21/2014 Elsevier Interactive Patient Education  2017 [employer].

## 2021-12-30 NOTE — Patient Instructions (Signed)
Full PFT Performed Today  

## 2021-12-30 NOTE — Patient Instructions (Addendum)
I recommend a trial of symbicort (Breztri) 80 take 2 puffs first thing in am and then another 2 puffs about 12 hours later.   Please schedule a follow up visit in 3 months but call sooner if needed in [place]

## 2021-12-30 NOTE — Assessment & Plan Note (Signed)
Quit smoking 1993/MM - Spirometry 05/21/2015   FEV1 1.95 (74%)  Ratio 0.66? p spiriva  10/11/2020   Walked RA  approx  328f  @ nl pace  stopped due to  Leg pain, no sob and sats 95% ; - 10/11/2020    try stiolto 2 puffs each am  > no better doe so stopped all inhalers - 09/20/2021  After extensive coaching inhaler device,  effectiveness =    75% try stiolto again  - 09/20/2021   Walked on RA  x  3  lap(s) =  approx 450  ft  @ mod pace, stopped due to end of study with lowest 02 sats 94% sob at start of 3rd lap but did not stop, no assoc claudication /cp  - 09/2021 d/c'd stiolto  - no better ex tol on vs off  Labs ordered 11/01/2021  :     alpha one AT phenotype  MM   Level 173  - 12/30/2021  After extensive coaching inhaler device,  effectiveness =    75%  - PFT's  12/30/2021   FEV1 1.81 (84 % ) ratio 0.56  p 17 % improvement from saba p 0 prior to study with DLCO  9.91 (49%)   and FV curve slt concave   He has minimal symptoms but does identify at least one activity (mailbox walking) that gives him doe and reasonable to try low dose generic symbicort  eg 80 2bid to see what if any difference this makes at a low price option at his request  Discussed in detail all the  indications, usual  risks and alternatives  relative to the benefits with patient who agrees to proceed with Rx as outlined.      F[place] 3 [place] if needed         Each maintenance medication was reviewed in detail including emphasizing most importantly the difference between maintenance and prns and under what circumstances the prns are to be triggered using an action plan format where appropriate.  Total time for H and P, chart review, counseling, reviewing hfa  device(s) and generating customized AVS unique to this office visit / same day charting  > 30 min

## 2021-12-30 NOTE — Addendum Note (Signed)
Addended by: Rosana Berger on: 12/30/2021 05:04 PM   Modules accepted: Orders

## 2021-12-30 NOTE — Progress Notes (Addendum)
Subjective:   William Thoma. is a 81 y.o. male who presents for Medicare Annual/Subsequent preventive examination.  Review of Systems     Cardiac Risk Factors include: advanced age (>54mn, >>53women);dyslipidemia;family history of premature cardiovascular disease;hypertension;male gender;sedentary lifestyle     Objective:    Today's Vitals   12/30/21 1333  BP: 130/60  Pulse: 61  Temp: 98.2 F (36.8 C)  SpO2: 94%  Weight: 152 lb 6.4 oz (69.1 kg)  Height: '5\' 6"'$  (1.676 m)  PainSc: 0-No pain   Body mass index is 24.6 kg/m.     12/30/2021    2:08 PM 03/07/2021   12:47 PM 12/21/2020    1:14 PM 12/06/2020   10:20 AM 01/26/2020   10:15 AM 02/03/2018   10:06 AM 08/21/2017    6:09 AM  Advanced Directives  Does Patient Have a Medical Advance Directive? Yes Yes Yes Yes Yes Yes Yes  Type of AParamedicof [place]aver[place] will Living will;Healthcare Power of [place] will [place] will Healthcare Power of [place]of [place]add[place] will  Does patient want to make changes to medical advance directive?  No - Patient declined No - Patient declined No - Patient declined No - Patient declined    Copy of [place] Chart? No - copy requested  No - copy requested Yes - validated most recent copy scanned in chart (See row information) No - copy requested  No - copy requested  Would patient like information on creating a medical advance directive?     No - Patient declined      Current Medications (verified) Outpatient Encounter Medications as of 12/30/2021  Medication Sig   amLODipine (NORVASC) 5 MG tablet Take 1 tablet (5 mg total) by mouth daily.   Ascorbic Acid (VITAMIN C) 1000 MG tablet Take 1,000 mg by mouth in the morning.   aspirin EC 81 MG tablet Take 1 tablet (81 mg total) by mouth daily.   cholecalciferol (VITAMIN D) 25 MCG  (1000 UNIT) tablet Take 1,000 Units by mouth in the morning.   ezetimibe (ZETIA) 10 MG tablet TAKE 1 TABLET BY MOUTH EVERY DAY   famotidine (PEPCID) 20 MG tablet Take 20 mg by mouth every evening.   fish oil-omega-3 fatty acids 1000 MG capsule Take 1,000 mg by mouth in the morning.   fluticasone (FLONASE) 50 MCG/ACT nasal spray Place 2 sprays into both nostrils daily.   folic acid (FOLVITE) 4782MCG tablet Take 400 mcg by mouth in the morning.   Garlic 29562MG TBEC Take 2,000 mg by mouth in the morning.   isosorbide mononitrate (IMDUR) 60 MG 24 hr tablet Take 1 tablet (60 mg total) by mouth daily.   metoprolol tartrate (LOPRESSOR) 50 MG tablet Take 1 tablet (50 mg total) by mouth 2 (two) times daily.   Multiple Vitamin (MULTIVITAMIN WITH MINERALS) TABS tablet Take 1 tablet by mouth in the morning.   nitroGLYCERIN (NITROSTAT) 0.4 MG SL tablet Place 1 tablet (0.4 mg total) under the tongue every 5 (five) minutes as needed for chest pain. If pain does not resolve with rest.   omega-3 acid ethyl esters (LOVAZA) 1 g capsule Take 1 capsule (1 g total) by mouth daily.   omeprazole (PRILOSEC OTC) 20 MG tablet Take 1 tablet (20 mg total) by mouth daily. Take 30-60 min before first meal of the day   rosuvastatin (CRESTOR) 10 MG  tablet TAKE 1 TABLET BY MOUTH EVERY DAY N   thiamine (VITAMIN B-1) 100 MG tablet Take 100 mg by mouth every evening.    thyroid (NP THYROID) 30 MG tablet Take 1 tablet (30 mg total) by mouth daily before breakfast.   ticagrelor (BRILINTA) 90 MG TABS tablet Take 1 tablet (90 mg total) by mouth 2 (two) times daily.   valsartan (DIOVAN) 160 MG tablet TAKE 1 TABLET BY MOUTH EVERY DAY   No facility-administered encounter medications on file as of 12/30/2021.    Allergies (verified) Patient has no known allergies.   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[facility])    Myocardial infarction ([place])    Peripheral arterial disease ([place])    Suboptimal revascularization options   Past Surgical History:  Procedure Laterality Date   ABDOMINAL AORTOGRAM W/LOWER EXTREMITY N/A 08/21/2017   Procedure: ABDOMINAL AORTOGRAM W/LOWER EXTREMITY;  Surgeon: Angelia Mould, MD;  Location: [facility];  Service: Cardiovascular;  Laterality: N/A;   Bilateral renal  artery stenoses  04/23/2009   CARDIAC CATHETERIZATION N/A 05/21/2015   Procedure: Left Heart Cath and Coronary Angiography;  Surgeon: Lorretta Harp, MD;  Location: [facility];  Service: Cardiovascular;  Laterality: N/A;   CORONARY ARTERY BYPASS GRAFT N/A 05/22/2015   Procedure: CORONARY ARTERY BYPASS GRAFTING (CABG) LIMA-LAD and SVG-OM EVH RIGHT THIGH GREATER SAPHENOUS VEIN;  Surgeon: [name], MD;  Location: [facility];  Service: Open Heart Surgery;  Laterality: N/A;   CORONARY ATHERECTOMY N/A 12/21/2020   Procedure: CORONARY ATHERECTOMY;  Surgeon: Burnell Blanks, MD;  Location: [facility];  Service: Cardiovascular;  Laterality: N/A;   CORONARY BALLOON ANGIOPLASTY N/A 12/06/2020   Procedure: CORONARY BALLOON ANGIOPLASTY;  Surgeon: Burnell Blanks, MD;  Location: [facility];  Service: Cardiovascular;  Laterality: N/A;   CORONARY STENT INTERVENTION N/A 12/21/2020   Procedure: CORONARY STENT INTERVENTION;  Surgeon: Burnell Blanks, MD;  Location: [facility];  Service: Cardiovascular;  Laterality: N/A;   HIATAL HERNIA REPAIR     INTRAVASCULAR ULTRASOUND/IVUS N/A 12/21/2020   Procedure: Intravascular Ultrasound/IVUS;  Surgeon: Burnell Blanks, MD;  Location: [facility];  Service: Cardiovascular;  Laterality: N/A;   LEFT HEART CATH AND CORS/GRAFTS ANGIOGRAPHY N/A 12/06/2020   Procedure: LEFT  HEART CATH AND CORS/GRAFTS ANGIOGRAPHY;  Surgeon: Burnell Blanks, MD;  Location: [facility];  Service: Cardiovascular;  Laterality: N/A;   Percutaneous translumnial angioplasty  04/23/2009   Catheterization of [place]efst external iliac artery with Left lower extremity runoff   TEE WITHOUT CARDIOVERSION N/A 05/22/2015   Procedure: TRANSESOPHAGEAL ECHOCARDIOGRAM (TEE);  Surgeon: [name], MD;  Location: [facility];  Service: Open Heart Surgery;  Laterality: N/A;   Family History  Problem Relation Age of Onset   Heart disease Father        Heart Disease before age 67   Pulmonary embolism Father    Deep vein thrombosis Father    Cancer Mother        Sarcoma   Social History   Socioeconomic History   Marital status: Married    Spouse name: Not on file  Number of children: Not on file   Years of education: Not on file   Highest education level: Not on file  Occupational History   Not on file  Tobacco Use   Smoking status: Former    Packs/day: 2.00    Years: 30.00    Total pack years: 60.00    Types: Cigarettes    Start date: 06/03/1961    Quit date: 11/17/1991    Years since quitting: 30.1    Passive exposure: Never   Smokeless tobacco: Former    Types: Snuff, Chew  Vaping Use   Vaping Use: Never used  Substance and Sexual Activity   Alcohol use: Not Currently    Alcohol/week: 2.0 - 4.0 standard drinks of alcohol    Types: 2 - 4 Standard drinks or equivalent per week    Comment: used to, quit 2012   Drug use: No   Sexual activity: Not Currently  Other Topics Concern   Not on file  Social History Narrative   Retired used to work in [employer]. Widower. Lives alone.   Social Determinants of Health   Financial Resource Strain: Low Risk  (12/30/2021)   Overall Financial Resource Strain (CARDIA)    Difficulty of Paying Living Expenses: Not hard at all  Food Insecurity: No Food Insecurity (12/30/2021)   Hunger Vital Sign    Worried About Running Out of Food  in the Last Year: Never true    Ran Out of Food in the Last Year: Never true  Transportation Needs: No Transportation Needs (12/30/2021)   PRAPARE - Hydrologist (Medical): No    Lack of Transportation (Non-Medical): No  Physical Activity: Inactive (12/30/2021)   Exercise Vital Sign    Days of Exercise per Week: 0 days    Minutes of Exercise per Session: 0 min  Stress: No Stress Concern Present (12/30/2021)   [place]    Feeling of Stress : Not at all  Social Connections: Moderately Integrated (12/30/2021)   Social Connection and Isolation Panel [NHANES]    Frequency of Communication with Friends and Family: More than three times a week    Frequency of Social Gatherings with Friends and Family: More than three times a week    Attends Religious Services: More than 4 times per year    Active Member of [employer] or Organizations: Yes    Attends Archivist Meetings: More than 4 times per year    Marital Status: Widowed    Tobacco Counseling Counseling given: Not Answered   Clinical Intake:  Pre-visit preparation completed: Yes  Pain : No/denies pain Pain Score: 0-No pain     BMI - recorded: 24.6 Nutritional Risks: None Diabetes: No  How often do you need to have someone help you when you read instructions, pamphlets, or other written materials from your doctor or pharmacy?: 1 - Never What is the last grade level you completed in school?: HSG  Diabetic? no  Interpreter Needed?: No  Information entered by :: Gift Rueckert, LPN.   Activities of Daily Living    12/30/2021    1:35 PM 04/11/2021   10:05 AM  In your present state of health, do you have any difficulty performing the following activities:  Hearing? 0 0  Vision? 0 0  Difficulty concentrating or making decisions? 0 0  Walking or climbing stairs? 0 0  Dressing or bathing? 0 0  Doing errands, shopping? 0 0  Preparing Food and eating ? N   Using the Toilet? N   In the past six months, have you accidently leaked urine? N   Do you have problems with loss of bowel control? N   Managing your Medications? N   Managing your Finances? N   Housekeeping or managing your Housekeeping? N     Patient Care Team: Ailene Ards, NP as PCP - General (Nurse Practitioner) Satira Sark, MD as PCP - Cardiology (Cardiology) Tanda Rockers, MD as Consulting Physician (Pulmonary Disease) Freddie Breech, [facility] as Consulting Physician (Optometry)  Indicate any recent Medical Services you may have received from other than [facility] providers in the past year (date may be approximate).     Assessment:   This is a routine wellness examination for [place].  Hearing/Vision screen Hearing Screening - Comments:: Denies hearing difficulties   Vision Screening - Comments:: Wears readers; cataracts removed - up to date with routine eye exams with [place] issues and exercise activities discussed: Current Exercise Habits: The patient does not participate in regular exercise at present, Exercise limited by: respiratory conditions(s);cardiac condition(s);orthopedic condition(s);Other - see comments (Issues with gait)   Goals Addressed             This Visit's Progress    GET BACK TO EXERCISING.        Depression Screen    12/30/2021    1:35 PM 11/28/2021    1:15 PM 08/23/2021    2:52 PM 06/12/2021    7:50 AM 03/07/2021    1:49 PM 10/03/2020    8:42 AM 07/04/2020    9:49 AM  PHQ 2/9 Scores  PHQ - 2 Score 3 3 0 0 3 0 0  PHQ- 9 Score '6 7  2 6 '$ 0 0    Fall Risk    12/30/2021    1:35 PM 11/28/2021    1:14 PM 08/23/2021    2:52 PM 03/07/2021    1:10 PM 10/03/2020    8:43 AM  Fall Risk   Falls in the past year? 0 0 0 0 0  Number falls in past yr: 0 0 0    Injury with Fall? 0 0 0    Risk for fall due to : No Fall Risks No Fall Risks  Impaired balance/gait   Risk for fall due to:  Comment    Patient states he loses his balance frequently.   Follow up Falls prevention discussed;Falls evaluation completed Falls evaluation completed  Falls evaluation completed     FALL RISK PREVENTION PERTAINING TO THE HOME:  Any stairs in or around the home? Yes  If so, are there any without handrails? No  Home free of loose throw rugs in walkways, pet beds, electrical cords, etc? Yes  Adequate lighting in your home to reduce risk of falls? Yes   ASSISTIVE DEVICES UTILIZED TO PREVENT FALLS:  Life alert? No  Use of a cane, walker or w/c? No  Grab bars in the bathroom? No  Shower chair or bench in shower? No  Elevated toilet seat or a handicapped toilet? No   TIMED UP AND GO:  Was the test performed? Yes .  Length of time to ambulate 10 feet: 6 sec.   Gait steady and fast without use of assistive device  Cognitive Function:      01/25/2019   11:20 AM  Montreal Cognitive Assessment   Visuospatial/ Executive (0/5) 3  Naming (0/3) 2  Attention: Read list of digits (0/2) 2  Attention: Read list of letters (0/1) 1  Attention: Serial 7 subtraction starting at 100 (0/3) 3  Language: Repeat phrase (0/2) 0  Language : Fluency (0/1) 0  Abstraction (0/2) 2  Delayed Recall (0/5) 2  Orientation (0/6) 5  Total 20  Adjusted Score (based on education) 21      12/30/2021    1:36 PM 01/26/2020   10:22 AM  6CIT Screen  What Year? 0 points 0 points  What month? 0 points 0 points  What time? 0 points 0 points  Count back from 20 0 points 0 points  Months in reverse 0 points 0 points  Repeat phrase 0 points 0 points  Total Score 0 points 0 points    Immunizations Immunization History  Administered Date(s) Administered   Fluad Quad(high Dose 65+) 01/25/2019, 01/26/2020, 12/22/2020   Moderna Sars-Covid-2 Vaccination 05/21/2019, 02/04/2020, 02/29/2020, 08/13/2020   PNEUMOCOCCAL CONJUGATE-20 11/28/2021   Pneumococcal Polysaccharide-23 01/28/2017   Tdap 01/05/2018    TDAP  status: Up to date  Flu Vaccine status: Completed at today's visit  Pneumococcal vaccine status: Up to date  Covid-19 vaccine status: Completed vaccines  Qualifies for Shingles Vaccine? Yes   Zostavax completed No   Shingrix Completed?: No.    Education has been provided regarding the importance of this vaccine. Patient has been advised to call insurance company to determine out of pocket expense if they have not yet received this vaccine. Advised may also receive vaccine at local pharmacy or Health Dept. Verbalized acceptance and understanding.  Screening Tests Health Maintenance  Topic Date Due   Zoster Vaccines- Shingrix (1 of 2) Never done   INFLUENZA VACCINE  10/29/2021   TETANUS/TDAP  01/06/2028   Pneumonia Vaccine 90+ Years old  Completed   HPV VACCINES  Aged Out   COVID-19 Vaccine  Discontinued    Health Maintenance  Health Maintenance Due  Topic Date Due   Zoster Vaccines- Shingrix (1 of 2) Never done   INFLUENZA VACCINE  10/29/2021    Colorectal cancer screening: No longer required.   Lung Cancer Screening: (Low Dose CT Chest recommended if Age 69-80 years, 30 pack-year currently smoking OR have quit w/in 15years.) does not qualify.   Lung Cancer Screening Referral: no  Additional Screening:  Hepatitis C Screening: does not qualify; Completed no  Vision Screening: Recommended annual ophthalmology exams for early detection of glaucoma and other disorders of the eye. Is the patient up to date with their annual eye exam?  Yes  Who is the provider or what is the name of the office in which the patient attends annual eye exams? [facility] If pt is not established with a provider, would they like to be referred to a provider to establish care? No .   Dental Screening: Recommended annual dental exams for proper oral hygiene  Community Resource Referral / Chronic Care Management: CRR required this visit?  No   CCM required this visit?  No      Plan:      I have personally reviewed and noted the following in the patient's chart:   Medical and social history Use of alcohol, tobacco or illicit drugs  Current medications and supplements including opioid prescriptions. Patient is not currently taking opioid prescriptions. Functional ability and status Nutritional status Physical activity Advanced directives List of other physicians Hospitalizations, surgeries, and ER visits in previous 12 months Vitals Screenings to include cognitive, depression, and falls Referrals and appointments  In addition, I have reviewed and discussed with patient certain preventive protocols, quality metrics, and best practice recommendations. A written personalized care plan for preventive services as well as general preventive health recommendations were provided to patient.     Sheral Flow, LPN   [birth year]   Nurse Notes: n/a  Medical screening examination/treatment/procedure(s) were performed by non-physician practitioner and as supervising physician I was immediately available for consultation/collaboration.  I agree with above. Lew Dawes, MD

## 2021-12-31 ENCOUNTER — Other Ambulatory Visit: Payer: Self-pay | Admitting: Internal Medicine

## 2022-01-01 ENCOUNTER — Other Ambulatory Visit: Payer: Self-pay | Admitting: Cardiovascular Disease

## 2022-01-02 ENCOUNTER — Other Ambulatory Visit: Payer: Self-pay | Admitting: *Deleted

## 2022-01-02 MED ORDER — TICAGRELOR 60 MG PO TABS: 60.0000 mg | ORAL_TABLET | Freq: Two times a day (BID) | ORAL | 6 refills | Status: DC

## 2022-01-06 ENCOUNTER — Ambulatory Visit: Payer: Medicare Other | Admitting: Podiatry

## 2022-01-06 ENCOUNTER — Other Ambulatory Visit: Payer: Self-pay | Admitting: Internal Medicine

## 2022-01-07 ENCOUNTER — Telehealth: Payer: Self-pay | Admitting: Internal Medicine

## 2022-01-07 NOTE — Telephone Encounter (Signed)
Refill was sent yesterday. I called the pharmacy to confirm they received the RX but I was transferred to the pharmacy voicemail.   Called patient to let him know as well but he did not answer. Left message for him to call us back.

## 2022-01-08 NOTE — Telephone Encounter (Signed)
Patient called back and was informed rx was called into the pharmacy-  nothing further needed.

## 2022-01-23 ENCOUNTER — Other Ambulatory Visit: Payer: Self-pay | Admitting: Internal Medicine

## 2022-01-27 ENCOUNTER — Telehealth: Payer: Self-pay | Admitting: *Deleted

## 2022-01-27 ENCOUNTER — Encounter: Payer: Self-pay | Admitting: *Deleted

## 2022-01-27 NOTE — Patient Outreach (Signed)
  Care Coordination   Initial Visit Note   01/27/2022 Name: William [name]. MRN: [id number] DOB: Jul 02, 1940  William [name]. is a 81 y.o. year old male who sees Ailene Ards, NP for primary care. I spoke with  William [name]. by phone today.  What matters to the patients health and wellness today?  Staying healthy. I have CAD and PVD. I've had a heart attack, open heart surgery and then stints. My legs hurt with any sustained movement and I have to rest. He does not have any wounds. He does not smoke (has a remote hx). He follows a great diet, takes his meds, goes to appts. He just had his AWV.   Goals Addressed             This Visit's Progress    Blood Pressure < 140/90       Obtaining goal well! Keep it up!  THN NP reinforced HTN regimen: take meds, take BP daily and record. Pt has been doing this for 2 years he says.     GET BACK TO EXERCISING.       Care Coordination Interventions: Pt reports he used to go to the gym but hasn't for the last couple of years due to [place]. He is on the go all day long but does not do any physical exercise regimen. He agrees he will try to walk 30 minutes 3 times a week.         SDOH assessments and interventions completed:  Yes  SDOH Interventions Today    Flowsheet Row Most Recent Value  SDOH Interventions   Food Insecurity Interventions Intervention Not Indicated  Housing Interventions Intervention Not Indicated  Transportation Interventions Intervention Not Indicated  Utilities Interventions Intervention Not Indicated  Physical Activity Interventions Other (Comments)  [Exercise counseling given: encouraged walking 30 min 3 times a week.]        Care Coordination Interventions Activated:  Yes  Care Coordination Interventions:  Yes, provided   Follow up plan: Follow up call scheduled for 2 weeks.    Encounter Outcome:  Pt. Visit Completed   Kayleen Memos C. Myrtie Neither, MSN, [facility] Gerontological Nurse Practitioner [facility] Care  Management [phone]

## 2022-02-10 ENCOUNTER — Telehealth: Payer: Self-pay | Admitting: *Deleted

## 2022-02-10 ENCOUNTER — Encounter: Payer: Self-pay | Admitting: *Deleted

## 2022-02-11 NOTE — Patient Outreach (Signed)
  Care Coordination   Follow Up Visit Note   02/11/2022 Name: William [name]. MRN: [id number] DOB: 03/17/1941  William [name]. is a 81 y.o. year old male who sees Ailene Ards, NP for primary care. I spoke with  William [name]. by phone today.  What matters to the patients health and wellness today?  Get back to what I know I'm supposed to do to keep myself healthy.    Goals Addressed             This Visit's Progress    Blood Pressure < 140/90       Care Coordination Interventions: Evaluation of current treatment plan related to hypertension self management and patient's adherence to plan as established by provider Reviewed medications with patient and discussed importance of compliance Counseled on the importance of exercise goals with target of 150 minutes per week Discussed plans with patient for ongoing care management follow up and provided patient with direct contact information for care management team  Pt admits to being off his usual mindful heart healthy diet and he has noted his BPs have been elevated. They have ranged from 135 - 162. His amlodipine was reduced not long ago to 5 mg by Dr. Domenic Polite. NP advised to begin following his heart healthy diet, avoid salt, walk or do other moderate exercise for 150 min / week. When we talk again if BP is staying up in today's range, NP will call Dr. Domenic Polite to advise and see if he would like to go back up to 10 mg of amlodipine.       GET BACK TO EXERCISING.       Care Coordination Interventions: Pt reports he is not doing any specific exercising but he is active though out the day. Encouraged him to be mindful about exercising..I'm going to walk 10 minutes, I'm going to rake leaves for 30 minutes and to keep track of his activities.         SDOH assessments and interventions completed:  Yes Previously assessed.   Care Coordination Interventions Activated:  Yes  Care Coordination Interventions:  Yes, provided    Follow up plan: Follow up call scheduled for 2 weeks.    Encounter Outcome:  Pt. Visit Completed   Kayleen Memos C. Myrtie Neither, MSN, [facility] Gerontological Nurse Practitioner [facility] Care Management [phone]

## 2022-02-20 ENCOUNTER — Other Ambulatory Visit: Payer: Self-pay | Admitting: Internal Medicine

## 2022-02-26 ENCOUNTER — Telehealth: Payer: Self-pay | Admitting: *Deleted

## 2022-02-26 ENCOUNTER — Ambulatory Visit: Payer: Self-pay | Admitting: *Deleted

## 2022-02-26 NOTE — Patient Outreach (Signed)
  Care Coordination   02/28/2022 Name: William [name]. MRN: [id number] DOB: Dec 22, 1940   Care Coordination Outreach Attempts:  An unsuccessful telephone outreach was attempted today to offer the patient information about available care coordination services as a benefit of their health plan.   Follow Up Plan:  Additional outreach attempts will be made to offer the patient care coordination information and services.   Encounter Outcome:  No Answer   Care Coordination Interventions:  No, not indicated     Anyelin Mogle C. Myrtie Neither, MSN, [facility] Gerontological Nurse Practitioner [facility] Care Management [phone]

## 2022-02-27 ENCOUNTER — Other Ambulatory Visit: Payer: Self-pay | Admitting: Nurse Practitioner

## 2022-02-27 ENCOUNTER — Other Ambulatory Visit: Payer: Self-pay | Admitting: Cardiovascular Disease

## 2022-02-27 ENCOUNTER — Other Ambulatory Visit: Payer: Self-pay | Admitting: Cardiology

## 2022-02-27 ENCOUNTER — Ambulatory Visit (INDEPENDENT_AMBULATORY_CARE_PROVIDER_SITE_OTHER): Payer: Medicare Other | Admitting: Nurse Practitioner

## 2022-02-27 VITALS — BP 150/74 | HR 65 | Temp 98.1°F | Ht 64.0 in | Wt 162.5 lb

## 2022-02-27 DIAGNOSIS — I10 Essential (primary) hypertension: Secondary | ICD-10-CM

## 2022-02-27 DIAGNOSIS — E559 Vitamin D deficiency, unspecified: Secondary | ICD-10-CM

## 2022-02-27 DIAGNOSIS — R1310 Dysphagia, unspecified: Secondary | ICD-10-CM | POA: Diagnosis not present

## 2022-02-27 DIAGNOSIS — R5383 Other fatigue: Secondary | ICD-10-CM | POA: Diagnosis not present

## 2022-02-27 DIAGNOSIS — I25118 Atherosclerotic heart disease of native coronary artery with other forms of angina pectoris: Secondary | ICD-10-CM | POA: Insufficient documentation

## 2022-02-27 DIAGNOSIS — E785 Hyperlipidemia, unspecified: Secondary | ICD-10-CM

## 2022-02-27 LAB — LIPID PANEL
Cholesterol: 101 mg/dL (ref 0–200)
HDL: 41.4 mg/dL (ref >39.00)
LDL Cholesterol: 43 mg/dL (ref 0–99)
NonHDL: 59.47
Total CHOL/HDL Ratio: 2
Triglycerides: 84 mg/dL (ref 0.0–149.0)
VLDL: 16.8 mg/dL (ref 0.0–40.0)

## 2022-02-27 LAB — COMPREHENSIVE METABOLIC PANEL
ALT: 18 U/L (ref 0–53)
AST: 23 U/L (ref 0–37)
Albumin: 4.4 g/dL (ref 3.5–5.2)
Alkaline Phosphatase: 50 U/L (ref 39–117)
BUN: 31 mg/dL — ABNORMAL HIGH (ref 6–23)
CO2: 25 mEq/L (ref 19–32)
Calcium: 9.8 mg/dL (ref 8.4–10.5)
Chloride: 108 mEq/L (ref 96–112)
Creatinine, Ser: 1.6 mg/dL — ABNORMAL HIGH (ref 0.40–1.50)
GFR: 40.38 mL/min — ABNORMAL LOW (ref >60.00)
Glucose, Bld: 105 mg/dL — ABNORMAL HIGH (ref 70–99)
Potassium: 4.9 mEq/L (ref 3.5–5.1)
Sodium: 140 mEq/L (ref 135–145)
Total Bilirubin: 0.5 mg/dL (ref 0.2–1.2)
Total Protein: 7.3 g/dL (ref 6.0–8.3)

## 2022-02-27 LAB — VITAMIN D 25 HYDROXY (VIT D DEFICIENCY, FRACTURES): VITD: 65.8 ng/mL (ref 30.00–100.00)

## 2022-02-27 LAB — T4, FREE: Free T4: 0.76 ng/dL (ref 0.60–1.60)

## 2022-02-27 LAB — TSH: TSH: 1.17 u[IU]/mL (ref 0.35–5.50)

## 2022-02-27 LAB — T3, FREE: T3, Free: 4 pg/mL (ref 2.3–4.2)

## 2022-02-27 NOTE — Telephone Encounter (Signed)
Rx refill sent to pharmacy. 

## 2022-02-27 NOTE — Assessment & Plan Note (Addendum)
Chronic, will reach out to cardiology regarding risks of stopping Brilinta in case patient has to undergo endoscopy.  Further recommendations may be made based on his response.

## 2022-02-27 NOTE — Assessment & Plan Note (Signed)
Chronic, above goal today.  Patient encouraged to avoid eating out frequently.  Patient to monitor blood pressure at home, follow-up in 6 weeks if blood pressure still remains elevated we will have to adjust medications.

## 2022-02-27 NOTE — Assessment & Plan Note (Signed)
Chronic, patient to continue on current medication regimen and follow-up with cardiology as scheduled.

## 2022-02-27 NOTE — Assessment & Plan Note (Signed)
Chronic, continue current treatment plan.  Check lipid panel today, further recommendations may be made based on his results.

## 2022-02-27 NOTE — Progress Notes (Signed)
Established Patient Office Visit  Subjective   Patient ID: William [name]., male    DOB: Aug 02, 1940  Age: 81 y.o. MRN: [id number]  Chief Complaint  Patient presents with   Hypertension    HTN/CAD/HLD/Fatigue: Reports having eaten out more frequently lately, has been monitoring blood pressure at home has noticed that it is getting elevated has stopped eating out over the last week.  Continues on amlodipine 5 mg/day, Imdur 60 mg daily, metoprolol 50 mg twice a day, valsartan 160 mg daily.  Tolerates medication well.  Has history of bypass as well as cardiac stenting.  Follows with cardiology on a regular basis.  On aspirin 81 mg daily, Zetia 10 mg daily, and rosuvastatin 10 mg daily for hyperlipidemia.  Tolerating medication well.  Due for lipid panel check.  Continues to have fatigue and is being treated for this with off label NP thyroid 30 mg daily, last thyroid panel within normal limits.  Dysphagia: Occurs intermittently, feels like it is challenging to swallow high up in his esophagus, denies sensation of food getting stuck in his esophagus.  Had been referred to GI previously for evaluation, there is concern regarding him being on Brilinta and undergoing endoscopy due to bleeding risk.      Review of Systems  Respiratory:  Positive for shortness of breath (chronic and stable).   Cardiovascular:  Negative for chest pain.      Objective:     BP (!) 150/74   Pulse 65   Temp 98.1 F (36.7 C) (Temporal)   Ht '5\' 4"'$  (1.626 m)   Wt 162 lb 8 oz (73.7 kg)   SpO2 95%   BMI 27.89 kg/m  BP Readings from Last 3 Encounters:  02/27/22 (!) 150/74  12/30/21 128/60  12/30/21 130/60   Wt Readings from Last 3 Encounters:  02/27/22 162 lb 8 oz (73.7 kg)  12/30/21 152 lb 9.6 oz (69.2 kg)  12/30/21 152 lb 6.4 oz (69.1 kg)      Physical Exam Vitals reviewed.  Constitutional:      Appearance: Normal appearance.  HENT:     Head: Normocephalic and atraumatic.  Cardiovascular:      Rate and Rhythm: Normal rate and regular rhythm.  Pulmonary:     Effort: Pulmonary effort is normal.     Breath sounds: Normal breath sounds.  Musculoskeletal:     Cervical back: Neck supple.  Skin:    General: Skin is warm and dry.  Neurological:     Mental Status: He is alert and oriented to person, place, and time.  Psychiatric:        Mood and Affect: Mood normal.        Behavior: Behavior normal.        Thought Content: Thought content normal.        Judgment: Judgment normal.      Results for orders placed or performed in visit on 02/27/22  VITAMIN D 25 Hydroxy (Vit-D Deficiency, Fractures)  Result Value Ref Range   VITD 65.80 30.00 - 100.00 ng/mL  T3, free  Result Value Ref Range   T3, Free 4.0 2.3 - 4.2 pg/mL  T4, free  Result Value Ref Range   Free T4 0.76 0.60 - 1.60 ng/dL  TSH  Result Value Ref Range   TSH 1.17 0.35 - 5.50 uIU/mL  Comprehensive metabolic panel  Result Value Ref Range   Sodium 140 135 - 145 mEq/L   Potassium 4.9 3.5 - 5.1 mEq/L  Chloride 108 96 - 112 mEq/L   CO2 25 19 - 32 mEq/L   Glucose, Bld 105 (H) 70 - 99 mg/dL   BUN 31 (H) 6 - 23 mg/dL   Creatinine, Ser 1.60 (H) 0.40 - 1.50 mg/dL   Total Bilirubin 0.5 0.2 - 1.2 mg/dL   Alkaline Phosphatase 50 39 - 117 U/L   AST 23 0 - 37 U/L   ALT 18 0 - 53 U/L   Total Protein 7.3 6.0 - 8.3 g/dL   Albumin 4.4 3.5 - 5.2 g/dL   GFR 40.38 (L) >60.00 mL/min   Calcium 9.8 8.4 - 10.5 mg/dL  Lipid panel  Result Value Ref Range   Cholesterol 101 0 - 200 mg/dL   Triglycerides 84.0 0.0 - 149.0 mg/dL   HDL 41.40 >39.00 mg/dL   VLDL 16.8 0.0 - 40.0 mg/dL   LDL Cholesterol 43 0 - 99 mg/dL   Total CHOL/HDL Ratio 2    NonHDL 59.47       The ASCVD Risk score (Arnett DK, et al., 2019) failed to calculate for the following reasons:   The 2019 ASCVD risk score is only valid for ages 68 to 85   The patient has a prior MI or stroke diagnosis    Assessment & Plan:   Problem List Items Addressed This  Visit       Cardiovascular and Mediastinum   Essential hypertension    Chronic, above goal today.  Patient encouraged to avoid eating out frequently.  Patient to monitor blood pressure at home, follow-up in 6 weeks if blood pressure still remains elevated we will have to adjust medications.      Coronary artery disease of native heart with stable angina pectoris ([place]) - Primary    Chronic, patient to continue on current medication regimen and follow-up with cardiology as scheduled.      Relevant Orders   Lipid panel (Completed)   Comprehensive metabolic panel (Completed)     Digestive   Dysphagia    Chronic, will reach out to cardiology regarding risks of stopping Brilinta in case patient has to undergo endoscopy.  Further recommendations may be made based on his response.        Other   Hyperlipidemia    Chronic, continue current treatment plan.  Check lipid panel today, further recommendations may be made based on his results.      Vitamin D deficiency    Chronic, check serum vitamin D level.  Recommendations may be made based on the results.  In the meantime patient will continue taking 1000 units of vitamin D3 daily.      Relevant Orders   Comprehensive metabolic panel (Completed)   VITAMIN D 25 Hydroxy (Vit-D Deficiency, Fractures) (Completed)   Fatigue    Chronic, check thyroid panel.  Further recommendations may be made based on these results.      Relevant Orders   TSH (Completed)   T4, free (Completed)   T3, free (Completed)    Return in about 6 weeks (around 04/10/2022) for f/u with Judson Roch - may be virtual if he is able to check BP at home.    Ailene Ards, NP

## 2022-02-27 NOTE — Assessment & Plan Note (Signed)
Chronic, check thyroid panel.  Further recommendations may be made based on these results.

## 2022-02-27 NOTE — Telephone Encounter (Signed)
Please call this patient and let him know that I heard from Dr. Domenic Polite.  Per Dr. Domenic Polite patient can temporarily stop Brilinta if he needs to undergo procedure with gastroenterology regarding his dysphagia.  The recommendation is to stop the Brilinta 5 days prior to endoscopy.  Thus, if the patient is experiencing dysphagia (difficulties with swallowing), I recommend he do see gastroenterology to determine whether or not they feel he needs endoscopy.

## 2022-02-27 NOTE — Assessment & Plan Note (Signed)
Chronic, check serum vitamin D level.  Recommendations may be made based on the results.  In the meantime patient will continue taking 1000 units of vitamin D3 daily.

## 2022-02-28 ENCOUNTER — Telehealth: Payer: Self-pay | Admitting: *Deleted

## 2022-02-28 ENCOUNTER — Encounter: Payer: Self-pay | Admitting: *Deleted

## 2022-02-28 NOTE — Patient Outreach (Signed)
  Care Coordination   Follow Up Visit Note   02/28/2022 Name: William [name]. MRN: [id number] DOB: 03-17-1941  William [name]. is a 81 y.o. year old male who sees Ailene Ards, NP for primary care. I spoke with  William [name]. by phone today.  What matters to the patients health and wellness today?  NOTHING NEW.    Goals      Blood Pressure < 140/90     Care Coordination Interventions: Evaluation of current treatment plan related to hypertension self management and patient's adherence to plan as established by provider Counseled on the importance of exercise goals with target of 150 minutes per week Discussed plans with patient for ongoing care management follow up and provided patient with direct contact information for care management team Advised patient, providing education and rationale, to monitor blood pressure daily and record, calling PCP for findings outside established parameters  SAW PCP, [place]. BP STILL NOT AT GOAL BUT NO MEDICATION CHANGES. SHE ENCOURAGED FOLLOWING HIS DIET AND EXERCISE ALSO.      GET BACK TO EXERCISING.     Care Coordination Interventions: Pt reports he is not doing any specific exercising but he is active though out the day. Encouraged him to be mindful about exercising..I'm going to walk 10 minutes, I'm going to rake leaves for 30 minutes and to keep track of his activities.  REINFORCED RECOMMENDED 150 MINUTES PER WEEK OF INTENDED "EXERCISE" [place].         SDOH assessments and interventions completed:  Yes PREVIOUSLY ASSESSED.   Care Coordination Interventions:  Yes, provided   Follow up plan: Follow up call scheduled for 1 MONTH.    Encounter Outcome:  Pt. Visit Completed   Kayleen Memos C. Myrtie Neither, MSN, [facility] Gerontological Nurse Practitioner [facility] Care Management [phone]

## 2022-02-28 NOTE — Patient Outreach (Signed)
  Care Coordination   02/28/2022 Name: William [name]. MRN: [id number] DOB: 1941-03-09   Care Coordination Outreach Attempts:  An unsuccessful telephone outreach was attempted today to offer the patient information about available care coordination services as a benefit of their health plan.   Follow Up Plan:  Additional outreach attempts will be made to offer the patient care coordination information and services.   Encounter Outcome:  No Answer   Care Coordination Interventions:  No, not indicated    SIG Keishawna Carranza C. Myrtie Neither, MSN, [facility] Gerontological Nurse Practitioner [facility] Care Management [phone]

## 2022-02-28 NOTE — Patient Outreach (Signed)
  Care Coordination   Follow Up Visit Note   02/28/2022 Name: William [name]. MRN: [id number] DOB: 1940/12/08  William [name]. is a 81 y.o. year old male who sees Ailene Ards, NP for primary care. I spoke with  William [name]. by phone today.  What matters to the patients health and wellness today?  NOTHING NEW, CONTINUE TRYING TO IMPROVE DIET AND INCREASE EXERCISE.    Goals Addressed             This Visit's Progress    Blood Pressure < 140/90       Care Coordination Interventions: Evaluation of current treatment plan related to hypertension self management and patient's adherence to plan as established by provider Counseled on the importance of exercise goals with target of 150 minutes per week Discussed plans with patient for ongoing care management follow up and provided patient with direct contact information for care management team Advised patient, providing education and rationale, to monitor blood pressure daily and record, calling PCP for findings outside established parameters  SAW PCP, [place]. BP STILL NOT AT GOAL BUT NO MEDICATION CHANGES. SHE ENCOURAGED FOLLOWING HIS DIET AND EXERCISE ALSO.      GET BACK TO EXERCISING.   Not on track    Care Coordination Interventions: Pt reports he is not doing any specific exercising but he is active though out the day. Encouraged him to be mindful about exercising..I'm going to walk 10 minutes, I'm going to rake leaves for 30 minutes and to keep track of his activities.  REINFORCED RECOMMENDED 150 MINUTES PER WEEK OF INTENDED "EXERCISE" [place].         SDOH assessments and interventions completed:  Yes PREVIOUSLY ASSESSED.   Care Coordination Interventions:  Yes, provided   Follow up plan: Follow up call scheduled for 1 MONTH.    Encounter Outcome:  Pt. Visit Completed   Kayleen Memos C. Myrtie Neither, MSN, [facility] Gerontological Nurse Practitioner [facility] Care Management [phone]

## 2022-04-01 ENCOUNTER — Encounter: Payer: Self-pay | Admitting: Internal Medicine

## 2022-04-01 ENCOUNTER — Ambulatory Visit (INDEPENDENT_AMBULATORY_CARE_PROVIDER_SITE_OTHER): Payer: Medicare Other | Admitting: Internal Medicine

## 2022-04-01 VITALS — BP 130/82 | HR 64 | Temp 98.2°F | Ht 64.0 in | Wt 163.8 lb

## 2022-04-01 DIAGNOSIS — J449 Chronic obstructive pulmonary disease, unspecified: Secondary | ICD-10-CM | POA: Diagnosis not present

## 2022-04-01 NOTE — Assessment & Plan Note (Addendum)
Quit smoking 1993/MM - Spirometry 05/21/2015   FEV1 1.95 (74%)  Ratio 0.66? p spiriva  10/11/2020   Walked RA  approx  38f  @ nl pace  stopped due to  Leg pain, no sob and sats 95% ; - 10/11/2020    try stiolto 2 puffs each am  > no better doe so stopped all inhalers - 09/20/2021  After extensive coaching inhaler device,  effectiveness =    75% try stiolto again  - 09/20/2021   Walked on RA  x  3  lap(s) =  approx 450  ft  @ mod pace, stopped due to end of study with lowest 02 sats 94% sob at start of 3rd lap but did not stop, no assoc claudication /cp  - 09/2021 d/c'd stiolto  - no better ex tol on vs off  Labs ordered 11/01/2021  :     alpha one AT phenotype  MM   Level 173  - 12/30/2021  After extensive coaching inhaler device,  effectiveness =    75%  - PFT's  12/30/2021   FEV1 1.81 (84 % ) ratio 0.56  p 17 % improvement from saba p 0 prior to study with DLCO  9.91 (49%)   and FV curve slt concave  -d/c maint symb 04/01/2022 due to now perceived benefit   No need for maint symbicort 80 but for AB can use it prn Based on two studies from NEJM  378; 20 p 1865 (2018) and 380 : p2020-30 (2019) in pts with mild asthma it is reasonable to use low dose symbicort eg 80 2bid "prn" flare in this setting but I emphasized this was only shown with symbicort and takes advantage of the rapid onset of action but is not the same as "rescue therapy" but can be stopped once the acute symptoms have resolved and the need for rescue has been minimized (< 2 x weekly)    >>>f/u q 6 m, sooner if needed          Each maintenance medication was reviewed in detail including emphasizing most importantly the difference between maintenance and prns and under what circumstances the prns are to be triggered using an action plan format where appropriate.  Total time for H and P, chart review, counseling, reviewing hfa device(s) and generating customized AVS unique to this office visit / same day charting = 24 min

## 2022-04-01 NOTE — Progress Notes (Signed)
William [name]., male    DOB: 11/24/1940,    MRN: [id number]   Brief patient profile:  42 yowm quit smoking 1993/MM  with GOLD 2 COPD  criteria 05/2015 while on spiriva with reported doe = MMRC 1  but did not  think the spiriva dpi is working as well and also limited by claudication and sometimes ex cp with known IHD so referred to pulmonary clinic in [place]  10/11/2020 by Dr  Domenic Polite     History of Present Illness  10/11/2020  Pulmonary/ 1st office eval/ [name] / [name]  Chief Complaint  Patient presents with   Consult    Patient has COPD and patient states that he has been having trouble with his heart and was sent here to make sure his lungs are ok. Wife died of lung cancer. Shortness of breath with exertion. Takes Spiriva  Dyspnea:  legs usually stop him 1st x 100 ft  flat and slow    Cough: not much Sleep: flat bed/ one pillow  SABA use: none  Rec Stop spiriva and start Stiolto 2 puffs first thing in Am x 2 weeks then try 1 puff daily x 1 weeks then try nothing to see if it really make a difference Work on inhaler technique: If worse chest pains with activity on the stiolto 2 puffs, can try one puff instead but if it persists, stop stiolto If even the 2 puffs doesn't help your breathing , you need a trial off lisinopril 40 and on benicar 40  for at least 4 weeks - call if you want to try it and I'll call it in for you.     09/20/2021  f/u ov/[place] office/Donnielle Addison re: GOLD 2 / ?PF  maint on no rx  Chief Complaint  Patient presents with   Follow-up    SOB is about the same since last ov.   Dyspnea:  says breathing not been right since stents despite rehab  Cough: mostly throat clearing  Sleeping: cpap at hs / once a week wakes up slt bloody sputum  SABA use: none  02: none  Covid status: "all of em" Rec Resume stiolto 2 puffs each am  until return  Keep up the walking as much as you can to see if stiolto helps (like high octane fuel should help your  engine) Try prilosec otc (omepazole) 20 mg  Take 30-60 min before first meal of the day and Pepcid ac (famotidine) 20 mg one @  bedtime until return.          11/14/2021  f/u ov/[place] office/[name] re: GOLD 2/ hbp with cri  maint on nothing   Chief Complaint  Patient presents with   Follow-up    Follow up after lab result change bp med. 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  Cough: still clearing throat mostly just spit and assoc dysphagia  Sleeping: cpap SABA use: none  02: none  Covid status: never had it / vax x all  No urinary symptoms Rec Continue to take omeprazole  otc (omeprazole) '20mg'$   Take 30-60 min before first meal of the day and Pepcid ac (famotidine) 20 mg one @  bedtime until cough is completely gone for at least a week without the need for cough suppression GERD  diet Contact Dr Pearline Cables for GI and ENT evaluation in [facility] for your throat and swallowing  problems  - call me if you need my office to help  Please remember to go to the [facility]  -  Eos 0.8 / MM phenotype     12/30/2021  f/u ov/[name] re: GOLD 1 copd    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 Breathing is unchanged since the last visit. No new co's.   Dyspnea:  mailbox 250 ft and struggle to get back without stopping up slt incline Cough: none  Sleeping: cpap bed blocks SABA use: none  02: none  Covid status:   vax max  Rec I recommend a trial of symbicort (similar to [place] sample) 80 take 2 puffs first thing in am and then another 2 puffs about 12 hours later  04/01/2022  f/u ov/[place] office/[name] re: GOLD 1 maint on symbicort   Chief Complaint  Patient presents with   Follow-up    Breathing doing well since last ov   Dyspnea:  no change to MB and back  Cough: getting over a head cold about the same as priors before starting symb 80/  mucus white/ thick but not keeping him up  Sleeping: Cpap (per neuro) and bed blocks  SABA use: none  02: none      No obvious day to day or daytime variability or assoc  mucus plugs or hemoptysis or cp or chest tightness, subjective wheeze or overt sinus or hb symptoms.   Sleeping  without nocturnal  or early am exacerbation  of respiratory  c/o's or need for noct saba. Also denies any obvious fluctuation of symptoms with weather or environmental changes or other aggravating or alleviating factors except as outlined above   No unusual exposure hx or h/o childhood pna/ asthma or knowledge of premature birth.  Current Allergies, Complete Past Medical History, Past Surgical History, Family History, and Social History were reviewed in [employer] record.  ROS  The following are not active complaints unless bolded Hoarseness, sore throat, dysphagia, dental problems, itching, sneezing,  nasal congestion or discharge of excess mucus or purulent secretions, ear ache,   fever, chills, sweats, unintended wt loss or wt gain, classically pleuritic or exertional cp,  orthopnea pnd or arm/hand swelling  or leg swelling, presyncope, palpitations, abdominal pain, anorexia, nausea, vomiting, diarrhea  or change in bowel habits or change in bladder habits, change in stools or change in urine, dysuria, hematuria,  rash, arthralgias, visual complaints, headache, numbness, weakness or ataxia or problems with walking or coordination,  change in mood or  memory.        Current Meds  Medication Sig   amLODipine (NORVASC) 5 MG tablet Take 1 tablet (5 mg total) by mouth daily. (Patient taking differently: Take 10 mg by mouth daily.)   Ascorbic Acid (VITAMIN C) 1000 MG tablet Take 1,000 mg by mouth in the morning.   aspirin EC 81 MG tablet Take 1 tablet (81 mg total) by mouth daily.   budesonide-formoterol (SYMBICORT) 80-4.5 MCG/ACT inhaler Take 2 puffs first thing in am and then another 2 puffs  about 12 hours later.   cholecalciferol (VITAMIN D) 25 MCG (1000 UNIT) tablet Take 1,000 Units by mouth in the morning.   ezetimibe (ZETIA) 10 MG tablet TAKE 1 TABLET BY MOUTH EVERY DAY   famotidine (PEPCID) 20 MG tablet Take 20 mg by mouth every evening.   fish oil-omega-3 fatty acids 1000 MG capsule Take 1,000  mg by mouth in the morning.   fluticasone (FLONASE) 50 MCG/ACT nasal spray Place 2 sprays into both nostrils daily.   folic acid (FOLVITE) 416 MCG tablet Take 400 mcg by mouth in the morning.   Garlic 6063 MG TBEC Take 2,000 mg by mouth in the morning.   isosorbide mononitrate (IMDUR) 60 MG 24 hr tablet TAKE 1 TABLET BY MOUTH EVERY DAY   metoprolol tartrate (LOPRESSOR) 50 MG tablet TAKE 1 TABLET BY MOUTH TWICE A DAY   Multiple Vitamin (MULTIVITAMIN WITH MINERALS) TABS tablet Take 1 tablet by mouth in the morning.   nitroGLYCERIN (NITROSTAT) 0.4 MG SL tablet Place 1 tablet (0.4 mg total) under the tongue every 5 (five) minutes as needed for chest pain. If pain does not resolve with rest.   NP THYROID 30 MG tablet TAKE 1 TABLET (30 MG TOTAL) BY MOUTH DAILY BEFORE BREAKFAST.   omeprazole (PRILOSEC OTC) 20 MG tablet Take 1 tablet (20 mg total) by mouth daily. Take 30-60 min before first meal of the day   rosuvastatin (CRESTOR) 10 MG tablet TAKE 1 TABLET BY MOUTH EVERY DAY N   thiamine (VITAMIN B-1) 100 MG tablet Take 100 mg by mouth every evening.    ticagrelor (BRILINTA) 90 MG TABS tablet TAKE 1 TABLET BY MOUTH TWICE A DAY   valsartan (DIOVAN) 160 MG tablet TAKE 1 TABLET BY MOUTH EVERY DAY                          Objective:    Wts  04/01/2022         163  12/30/2021       152  11/14/2021       151  11/01/2021         146  09/20/2021       156  03/26/2021     159   12/20/20 161 lb 1.9 oz (73.1 kg)  12/13/20 162 lb 9.6 oz (73.8 kg)  12/11/20 164 lb (74.4 kg)    Vital signs reviewed  04/01/2022  - Note at rest 02 sats  96% on RA   General appearance:    pleasant mod obese wm nad   / slt rattle    HEENT : Oropharynx  clear     NECK :  without  apparent JVD/ palpable Nodes/TM    LUNGS: no acc muscle use,  Min barrel  contour chest wall with bilateral  slightly decreased bs s audible wheeze and  without cough on insp or exp maneuvers and min  Hyperresonant  to  percussion bilaterally    CV:  RRR  no s3 or murmur or increase in P2, and no edema   ABD:  soft and nontender with pos end  insp Hoover's  in the supine position.  No bruits or organomegaly appreciated   MS:  Nl gait/ ext warm without deformities Or obvious joint restrictions  calf tenderness, cyanosis or clubbing     SKIN: warm and dry without lesions    NEURO:  alert, approp, nl sensorium with  no motor or cerebellar deficits apparent.             Assessment

## 2022-04-01 NOTE — Patient Instructions (Signed)
For cough/ congestion > mucinex dm up to 1200 mg twice daily   Try off symbicort 80  to see if any difference - ok to just take it 30 min priority to activity   Please schedule a follow up visit in 6 months but call sooner if needed

## 2022-04-02 ENCOUNTER — Telehealth: Payer: Self-pay | Admitting: *Deleted

## 2022-04-02 ENCOUNTER — Encounter: Payer: Self-pay | Admitting: *Deleted

## 2022-04-02 NOTE — Patient Outreach (Signed)
  Care Coordination   Follow Up Visit Note   04/02/2022 Name: William [name]. MRN: [id number] DOB: 01/07/1941  William [name]. is a 82 y.o. year old male who sees Ailene Ards, NP for primary care. I spoke with  William [name]. by phone today.  What matters to the patients health and wellness today?  To continue to take care of myself and prevent health complications.    Goals Addressed             This Visit's Progress    Blood Pressure < 140/90       Care Coordination Interventions: Evaluation of current treatment plan related to hypertension self management and patient's adherence to plan as established by provider Reviewed medications with patient and discussed importance of compliance Counseled on the importance of exercise goals with target of 150 minutes per week Discussed plans with patient for ongoing care management follow up and provided patient with direct contact information for care management team  Pt BP at his recent provider visit was improved at 130/80. Continue same regimen. Get started on your planned exercise regimen ie Silver Sneakers or similar over the next 30 days.       GET BACK TO EXERCISING.   Not on track    Care Coordination Interventions: Pt is active in his daily regimen however, is not doing any regular exercise. He says he will start going to a gym or silver sneakers program during the next 30 days. Exercise is vital to our bodies to maintain strength, mobility, stimulation. It is helpful with increasing our respiratory capacity and reducing cardiovascular complications.          SDOH assessments and interventions completed:  Yes   PREVIOUSLY ASSESSED.  Care Coordination Interventions:  Yes, provided   Follow up plan: Follow up call scheduled for 1 MONTH.    Encounter Outcome:  Pt. Visit Completed   Kayleen Memos C. Myrtie Neither, MSN, [facility] Gerontological Nurse Practitioner [facility] Care Management [phone]

## 2022-04-03 ENCOUNTER — Telehealth: Payer: Self-pay

## 2022-04-07 NOTE — Progress Notes (Unsigned)
PATIENT: William [name]. DOB: May 20, 1940  REASON FOR VISIT: follow up HISTORY FROM: patient  No chief complaint on file.    HISTORY OF PRESENT ILLNESS:  04/07/22 ALL: [place] returns for follow up for OSA on CPAP.     12/11/2021 ALL: William [name] returns for follow up for OSA on CPAP. He reports more difficulty tolerating CPAP over the past couple of months. He had three cardiac stents placed after our visit last year, 11/2020. Since, he has had difficulty with worsening shob. He feels that he has more trouble breathing at times when using CPAP. He admits that compliance has fallen off. He is currently using a nasal pillow and leak has worsening, significantly. We have always had higher leak, probably due to mask type and facial hair. AHI has remained well managed, however, it is elevated over the last 30 days to 13.4. He states that he has not received his new supplies for this month. He is anticipating pulmonary function testing soon. He has stopped all inhalers. He is hoping to have GI workup for possible GERD but cannot have scope now as he is on Brilenta.     12/11/2020 ALL: William [name] returns for follow up for OSA on CPAP. He is doing well on therapy. He denies concerns with machine or supplies. Facial hari continues to contribute to large air leak. AHI is well managed and he is sleeping well.   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 Cardiology was unable to place stents. He also has left subclavian occlusion, followed by Dr Scot Dock, vascular. He has planned orbital atherectomy and stenting of RCA on 12/21/2020. He continues asa and Brilinta. He was referred to PharmD for lipid management.     12/08/2019 ALL: William [name]. is a 82 y.o. male here today for follow up for OSA on CPAP.  He continues to do well with CPAP therapy.  He does note significant improvement in daytime  energy.  He continues to note a leak on his mask.  He feels that nasal pillows are the best fit.  Facial hair is most likely contributing to an adequate mask seal.  Otherwise he is doing very well.  Compliance report dated 11/07/2019 through 12/06/2019 reveals that he used CPAP 29 of the past 30 days for compliance of 97%.  He used CPAP greater than 4 hours 28 of the past 30 days for compliance of 93%.  Average usage on days used was 8 hours and 25 minutes.  Residual AHI was 2.6 on a set pressure of 10 cm of water.  Leak continues to be elevated in the 95th percentile of 53.2 L/min.  HISTORY: (copied from my note on 03/21/2019)  William [name]. is a 82 y.o. male here today for follow up for OSA on CPAP. He reports that he is doing very well with CPAP therapy. He is using CPAP nightly. He does have a full mustache and beard. He feels that this is why he continues to note a leak. He feels that he has his mask and headgear as tight as he can tolerate.  He does note that he wakes up most mornings around 3:57 AM.  He states that he can sit on the side of the bed for a few minutes and drifts right back off to sleep.  He denies any concerns of nocturia.  He has no trouble with  insomnia.  He feels well rested when he wakes.   Compliance report dated 02/15/2019 through 03/16/2019 reveals that he has used CPAP 30 out of the last 30 days for compliance of 100%.  29 of the last 30 days he used CPAP greater than 4 hours for compliance of 97%.  Average usage was 7 hours and 23 minutes.  Residual AHI was 1.6 on a set pressure of 10 cm of water and an EPR of 1 leak noted in the 95th percentile of 43.1.     HISTORY: (copied from Dr Guadelupe Sabin note on 07/13/2018)   [name] is a 82 year old right-handed gentleman with an underlying complex medical history of peripheral vascular disease with status post iliac stenting, coronary artery disease with status post CABG in 2017, hypertension, vitamin D deficiency, COPD, history of  non-STEMI, hyperlipidemia, carotid artery disease, and overweight state, with whom I am conducting a virtual, phone based follow-up appointment today in lieu of a face-to-face visit for follow-up consultation of his obstructive sleep apnea after interim sleep study testing and starting CPAP therapy. The patient is unaccompanied today and joins from home. I first met him on 02/23/2018 at the request of his primary care physician, Dr. Anastasio Champion, at which time he reported snoring and excessive daytime somnolence. He was advised to proceed with sleep study testing. I went over his test results with him today. He had a baseline sleep study on 04/05/2018 which showed a sleep latency delayed, REM latency 56 minutes, sleep efficiency reduced at 50.6%, he had absence of slow-wave sleep and REM sleep was 17.8%, total AHI 24.8 per hour, supine AHI 60.4 per hour and REM AHI 52.5 per hour. Average oxygen saturation 92%, nadir of 81%. He had severe PLMS with mild arousals. He was invited for a CPAP titration study. This was on 04/21/2018. Sleep efficiency 57.6%, sleep latency delayed, REM latency 140 minutes. He had near absence of slow-wave sleep and REM sleep was normal at 22.7%. CPAP was titrated from 5 cm to 9 cm. On the final pressure his AHI was 0.5 per hour, O2 nadir of 89% with supine REM sleep achieved. Based on his test results I prescribed CPAP therapy for home use at a pressure of 10 cm.   He also had an interim brain MRI in December 2019 because of his report of intermittent diplopia. He had the study in [place], [place], brain MRI with and without contrast showed periventricular white matter changes, no abnormal enhancement.   Today, 07/13/2018: Please also see below for virtual visit documentation.   I reviewed his CPAP compliance data from 06/12/2018 through 07/11/2018 which is a total of 30 days, during which time he used his machine every night with percent used days greater than 4 hours at 90%, indicating  excellent compliance with an average usage of 5 hours and 39 minutes, residual AHI at goal at 2.3 per hour, leak high with the 95th percentile at 42.2 L/m on a pressure of 10 cm.   Previously:   02/23/2018: (He) reports snoring and excessive daytime somnolence. I reviewed your office note from 01/05/2018, when he saw Jeralyn Ruths, nurse practitioner. His Epworth sleepiness score is 8 out of 24 today, fatigue score is 32 out of 63. He is widowed and lives alone, he has 2 children. 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 He drinks caffeine in the form of coffee, 2 per day on average. He had a BMP checked at your office on 01/19/2018 and I reviewed the results: BUN was 26, creatinine 0.94. He is trying to hydrate better. He reports a history of double vision for the past 5 years, symptoms have been infrequent, very brief, less than a minute at a time but have increased in frequency, maybe averaging once a month whereas he may have had double vision once every few months in the past. He has seen ophthalmology for this. He denies any one-sided weakness or numbness or stroke like symptoms in the past. He is followed by cardiology. He was encouraged to consider a sleep study by his cardiologist as well in the past. He has never had a sleep study. He does snore loudly. He is not aware of any family history of OSA. His bedtime is around 8:30 and rise time around 3:30. He has nocturia about twice per average night and denies morning headaches.   REVIEW OF SYSTEMS: Out of a complete 14 system review of symptoms, the patient complains only of the following symptoms, shortness of breath, chest pain  and all other reviewed systems are negative.  ESS: 2   ALLERGIES: No Known Allergies  HOME MEDICATIONS: Outpatient Medications Prior to Visit  Medication Sig Dispense  Refill   amLODipine (NORVASC) 5 MG tablet Take 1 tablet (5 mg total) by mouth daily. (Patient taking differently: Take 10 mg by mouth daily.) 90 tablet 1   Ascorbic Acid (VITAMIN C) 1000 MG tablet Take 1,000 mg by mouth in the morning.     aspirin EC 81 MG tablet Take 1 tablet (81 mg total) by mouth daily.     budesonide-formoterol (SYMBICORT) 80-4.5 MCG/ACT inhaler Take 2 puffs first thing in am and then another 2 puffs about 12 hours later. 1 each 11   cholecalciferol (VITAMIN D) 25 MCG (1000 UNIT) tablet Take 1,000 Units by mouth in the morning.     ezetimibe (ZETIA) 10 MG tablet TAKE 1 TABLET BY MOUTH EVERY DAY 90 tablet 3   famotidine (PEPCID) 20 MG tablet Take 20 mg by mouth every evening.     fish oil-omega-3 fatty acids 1000 MG capsule Take 1,000 mg by mouth in the morning.     fluticasone (FLONASE) 50 MCG/ACT nasal spray Place 2 sprays into both nostrils daily. 16 g 6   folic acid (FOLVITE) 621 MCG tablet Take 400 mcg by mouth in the morning.     Garlic 3086 MG TBEC Take 2,000 mg by mouth in the morning.     isosorbide mononitrate (IMDUR) 60 MG 24 hr tablet TAKE 1 TABLET BY MOUTH EVERY DAY 90 tablet 1   metoprolol tartrate (LOPRESSOR) 50 MG tablet TAKE 1 TABLET BY MOUTH TWICE A DAY 180 tablet 1   Multiple Vitamin (MULTIVITAMIN WITH MINERALS) TABS tablet Take 1 tablet by mouth in the morning.     nitroGLYCERIN (NITROSTAT) 0.4 MG SL tablet Place 1 tablet (0.4 mg total) under the tongue every 5 (five) minutes as needed for chest pain. If pain does not resolve with rest. 50 tablet 3   NP THYROID 30 MG tablet TAKE 1 TABLET (30 MG TOTAL) BY MOUTH DAILY BEFORE BREAKFAST. 90 tablet 1   omeprazole (PRILOSEC OTC) 20 MG tablet Take 1 tablet (20 mg total) by mouth daily. Take 30-60 min before first meal of the day     rosuvastatin (CRESTOR) 10 MG tablet TAKE 1  TABLET BY MOUTH EVERY DAY N 90 tablet 3   thiamine (VITAMIN B-1) 100 MG tablet Take 100 mg by mouth every evening.      ticagrelor (BRILINTA)  90 MG TABS tablet TAKE 1 TABLET BY MOUTH TWICE A DAY 60 tablet 3   valsartan (DIOVAN) 160 MG tablet TAKE 1 TABLET BY MOUTH EVERY DAY 30 tablet 3   No facility-administered medications prior to visit.    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[facility])    Myocardial infarction ([place])    Peripheral arterial disease ([place])    Suboptimal revascularization options    PAST SURGICAL HISTORY: Past Surgical History:  Procedure Laterality Date   ABDOMINAL AORTOGRAM W/LOWER EXTREMITY N/A 08/21/2017   Procedure: ABDOMINAL AORTOGRAM W/LOWER EXTREMITY;  Surgeon: Angelia Mould, MD;  Location: [facility];  Service: Cardiovascular;  Laterality: N/A;   Bilateral renal  artery stenoses  04/23/2009   CARDIAC CATHETERIZATION N/A 05/21/2015   Procedure: Left Heart Cath and Coronary Angiography;  Surgeon: Lorretta Harp, MD;  Location: [facility];  Service: Cardiovascular;  Laterality: N/A;   CORONARY ARTERY BYPASS GRAFT N/A 05/22/2015   Procedure: CORONARY ARTERY BYPASS GRAFTING (CABG) LIMA-LAD and SVG-OM EVH RIGHT THIGH GREATER SAPHENOUS VEIN;  Surgeon: [name], MD;  Location: [facility];  Service: Open Heart Surgery;  Laterality: N/A;   CORONARY ATHERECTOMY N/A 12/21/2020   Procedure: CORONARY ATHERECTOMY;  Surgeon: Burnell Blanks, MD;  Location: [facility];  Service: Cardiovascular;  Laterality: N/A;   CORONARY BALLOON ANGIOPLASTY N/A 12/06/2020   Procedure: CORONARY BALLOON ANGIOPLASTY;  Surgeon: Burnell Blanks, MD;  Location: [facility];  Service: Cardiovascular;  Laterality: N/A;   CORONARY STENT INTERVENTION N/A 12/21/2020   Procedure: CORONARY STENT INTERVENTION;  Surgeon:  Burnell Blanks, MD;  Location: [facility];  Service: Cardiovascular;  Laterality: N/A;   HIATAL HERNIA REPAIR     INTRAVASCULAR ULTRASOUND/IVUS N/A 12/21/2020   Procedure: Intravascular Ultrasound/IVUS;  Surgeon: Burnell Blanks, MD;  Location: [facility];  Service: Cardiovascular;  Laterality: N/A;   LEFT HEART CATH AND CORS/GRAFTS ANGIOGRAPHY N/A 12/06/2020   Procedure: LEFT HEART CATH AND CORS/GRAFTS ANGIOGRAPHY;  Surgeon: Burnell Blanks, MD;  Location: [facility];  Service: Cardiovascular;  Laterality: N/A;   Percutaneous translumnial angioplasty  04/23/2009   Catheterization of [place] external iliac artery with Left lower extremity runoff   TEE WITHOUT CARDIOVERSION N/A 05/22/2015   Procedure: TRANSESOPHAGEAL ECHOCARDIOGRAM (TEE);  Surgeon: [name], MD;  Location: [facility];  Service: Open Heart Surgery;  Laterality: N/A;    FAMILY HISTORY: Family History  Problem Relation Age of Onset   Heart disease Father        Heart Disease before age 84   Pulmonary embolism Father    Deep vein thrombosis Father    Cancer Mother        Sarcoma    SOCIAL HISTORY: Social History   Socioeconomic History   Marital status: Married    Spouse name: Not on file   Number of children: Not on file   Years of education: Not on  file   Highest education level: Not on file  Occupational History   Not on file  Tobacco Use   Smoking status: Former    Packs/day: 2.00    Years: 30.00    Total pack years: 60.00    Types: Cigarettes    Start date: 06/03/1961    Quit date: 11/17/1991    Years since quitting: 30.4    Passive exposure: Never   Smokeless tobacco: Former    Types: Snuff, Chew  Vaping Use   Vaping Use: Never used  Substance and Sexual Activity   Alcohol use: Not Currently    Alcohol/week: 2.0 - 4.0 standard drinks of alcohol    Types: 2 - 4 Standard drinks or equivalent per week    Comment: used to, quit 2012   Drug use: No   Sexual  activity: Not Currently  Other Topics Concern   Not on file  Social History Narrative   Retired used to work in [employer]. Widower. Lives alone.   Social Determinants of Health   Financial Resource Strain: Low Risk  (12/30/2021)   Overall Financial Resource Strain (CARDIA)    Difficulty of Paying Living Expenses: Not hard at all  Food Insecurity: No Food Insecurity (01/27/2022)   Hunger Vital Sign    Worried About Running Out of Food in the Last Year: Never true    Ran Out of Food in the Last Year: Never true  Transportation Needs: No Transportation Needs (01/27/2022)   PRAPARE - Hydrologist (Medical): No    Lack of Transportation (Non-Medical): No  Physical Activity: Insufficiently Active (01/27/2022)   Exercise Vital Sign    Days of Exercise per Week: 3 days    Minutes of Exercise per Session: 10 min  Stress: No Stress Concern Present (12/30/2021)   [place]    Feeling of Stress : Not at all  Social Connections: Moderately Integrated (12/30/2021)   Social Connection and Isolation Panel [NHANES]    Frequency of Communication with Friends and Family: More than three times a week    Frequency of Social Gatherings with Friends and Family: More than three times a week    Attends Religious Services: More than 4 times per year    Active Member of [employer] or Organizations: Yes    Attends Archivist Meetings: More than 4 times per year    Marital Status: Widowed  Intimate Partner Violence: Not At Risk (01/27/2022)   Humiliation, Afraid, Rape, and Kick questionnaire    Fear of Current or Ex-Partner: No    Emotionally Abused: No    Physically Abused: No    Sexually Abused: No      PHYSICAL EXAM  There were no vitals filed for this visit.   There is no height or weight on file to calculate BMI.  Generalized: Well developed, in no acute distress  Cardiology: normal rate  and rhythm, no murmur noted Respiratory: clear to auscultation bilaterally  Neurological examination  Mentation: Alert oriented to time, place, history taking. Follows all commands speech and language fluent Cranial nerve II-XII: Pupils were equal round reactive to light. Extraocular movements were full, visual field were full  Motor: The motor testing reveals 5 over 5 strength of all 4 extremities. Good symmetric motor tone is noted throughout.  Gait and station: Gait is normal.    DIAGNOSTIC DATA (LABS, IMAGING, TESTING) - I reviewed patient records, labs, notes, testing and imaging  myself where available.      No data to display           Lab Results  Component Value Date   WBC 8.9 11/14/2021   HGB 11.1 (L) 11/14/2021   HCT 32.9 (L) 11/14/2021   MCV 94 11/14/2021   PLT 320 11/14/2021      Component Value Date/Time   NA 140 02/27/2022 1105   NA 140 11/14/2021 1353   K 4.9 02/27/2022 1105   CL 108 02/27/2022 1105   CO2 25 02/27/2022 1105   GLUCOSE 105 (H) 02/27/2022 1105   BUN 31 (H) 02/27/2022 1105   BUN 25 11/14/2021 1353   CREATININE 1.60 (H) 02/27/2022 1105   CREATININE 1.06 07/04/2020 1018   CALCIUM 9.8 02/27/2022 1105   PROT 7.3 02/27/2022 1105   ALBUMIN 4.4 02/27/2022 1105   AST 23 02/27/2022 1105   ALT 18 02/27/2022 1105   ALKPHOS 50 02/27/2022 1105   BILITOT 0.5 02/27/2022 1105   GFRNONAA 52 (L) 05/31/2021 1218   GFRNONAA 66 07/04/2020 1018   GFRAA 77 07/04/2020 1018   Lab Results  Component Value Date   CHOL 101 02/27/2022   HDL 41.40 02/27/2022   LDLCALC 43 02/27/2022   TRIG 84.0 02/27/2022   CHOLHDL 2 02/27/2022   Lab Results  Component Value Date   HGBA1C 6.0 (H) 05/21/2015   No results found for: "VITAMINB12" Lab Results  Component Value Date   TSH 1.17 02/27/2022       ASSESSMENT AND PLAN 82 y.o. 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 here with   No diagnosis found.   Edon has had more difficulty with shortness of breath post cardiac stenting 11/2020. He is followed closely by pulmonology. Compliance report reveals acceptable usage, however, it is lower than in the past. Leak has increased and AHI is no longer well managed. He is happy with the fit of his mask but willing to try a nasal mask. Refitting orders placed. He was encouraged to continue monitoring for correct mask seal at home.  He was encouraged to continue healthy lifestyle habits.  He will continue close follow-up with pulmonology, cardiology, vascular and primary care as directed.  He will follow-up with me in 4 months, sooner if needed.  He verbalizes understanding and agreement with this plan.  No orders of the defined types were placed in this encounter.     No orders of the defined types were placed in this encounter.     Debbora Presto, FNP-C 04/07/2022, 4:01 PM [facility] [address] [place], [place] [address] [phone]

## 2022-04-07 NOTE — Patient Instructions (Incomplete)
Please continue using your CPAP regularly. While your insurance requires that you use CPAP at least 4 hours each night on 70% of the nights, I recommend, that you not skip any nights and use it throughout the night if you can. Getting used to CPAP and staying with the treatment long term does take time and patience and discipline. Untreated obstructive sleep apnea when it is moderate to severe can have an adverse impact on cardiovascular health and raise her risk for heart disease, arrhythmias, hypertension, congestive heart failure, stroke and diabetes. Untreated obstructive sleep apnea causes sleep disruption, nonrestorative sleep, and sleep deprivation. This can have an impact on your day to day functioning and cause daytime sleepiness and impairment of cognitive function, memory loss, mood disturbance, and problems focussing. Using CPAP regularly can improve these symptoms.  We will ask [facility] to assist with a mask refitting and getting you new supplies. I will reevaluate your compliance data in 4-6 weeks to make sure apneic events are lower with new mask. If not, we will consider titration study for setting adjustments.   DME: [facility] Phone: [phone], press option 1  Follow up in 4-6 months

## 2022-04-08 ENCOUNTER — Ambulatory Visit (INDEPENDENT_AMBULATORY_CARE_PROVIDER_SITE_OTHER): Payer: Medicare Other | Admitting: Family Medicine

## 2022-04-08 ENCOUNTER — Encounter: Payer: Self-pay | Admitting: Family Medicine

## 2022-04-08 VITALS — BP 135/55 | HR 65 | Ht 66.0 in | Wt 163.0 lb

## 2022-04-08 DIAGNOSIS — G4733 Obstructive sleep apnea (adult) (pediatric): Secondary | ICD-10-CM | POA: Diagnosis not present

## 2022-04-10 ENCOUNTER — Ambulatory Visit (INDEPENDENT_AMBULATORY_CARE_PROVIDER_SITE_OTHER): Payer: Medicare Other | Admitting: Nurse Practitioner

## 2022-04-10 VITALS — BP 128/64 | HR 57 | Temp 98.4°F | Ht 66.0 in | Wt 161.1 lb

## 2022-04-10 DIAGNOSIS — I10 Essential (primary) hypertension: Secondary | ICD-10-CM

## 2022-04-10 MED ORDER — AMLODIPINE BESYLATE 5 MG PO TABS: 10.0000 mg | ORAL_TABLET | Freq: Every day | ORAL | 3 refills | Status: DC

## 2022-04-10 NOTE — Assessment & Plan Note (Signed)
Chronic, blood pressure much better controlled compared to 6 weeks ago.  Now patient will continue on medications as currently prescribed.  Follow-up in 6 months or sooner as needed.

## 2022-04-10 NOTE — Progress Notes (Signed)
   Established Patient Office Visit  Subjective   Patient ID: Waldemar Siegel., male    DOB: [birth year]  Age: 82 y.o. MRN: [id number]  Chief Complaint  Patient presents with   Hypertension    Patient has for 6-week follow-up of hypertension.  Continues on valsartan, metoprolol, Imdur, and amlodipine.  Tolerating medication well.    Review of Systems  Eyes:  Negative for blurred vision.  Respiratory:  Positive for shortness of breath (has copd but feels breathing is stable).   Cardiovascular:  Negative for chest pain.  Neurological:  Negative for headaches.      Objective:     BP 128/64   Pulse (!) 57   Temp 98.4 F (36.9 C) (Temporal)   Ht '5\' 6"'$  (1.676 m)   Wt 161 lb 2 oz (73.1 kg)   SpO2 97%   BMI 26.01 kg/m  BP Readings from Last 3 Encounters:  04/10/22 128/64  04/08/22 (!) 135/55  04/01/22 130/82   Wt Readings from Last 3 Encounters:  04/10/22 161 lb 2 oz (73.1 kg)  04/08/22 163 lb (73.9 kg)  04/01/22 163 lb 12.8 oz (74.3 kg)      Physical Exam Vitals reviewed.  Constitutional:      Appearance: Normal appearance.  HENT:     Head: Normocephalic and atraumatic.  Cardiovascular:     Rate and Rhythm: Normal rate and regular rhythm.  Pulmonary:     Effort: Pulmonary effort is normal.     Breath sounds: Normal breath sounds.  Musculoskeletal:     Cervical back: Neck supple.  Skin:    General: Skin is warm and dry.  Neurological:     Mental Status: He is alert and oriented to person, place, and time.  Psychiatric:        Mood and Affect: Mood normal.        Behavior: Behavior normal.        Thought Content: Thought content normal.        Judgment: Judgment normal.      No results found for any visits on 04/10/22.    The ASCVD Risk score (Arnett DK, et al., 2019) failed to calculate for the following reasons:   The 2019 ASCVD risk score is only valid for ages 53 to 52   The patient has a prior MI or stroke diagnosis    Assessment & Plan:    Problem List Items Addressed This Visit       Cardiovascular and Mediastinum   Essential hypertension - Primary    Chronic, blood pressure much better controlled compared to 6 weeks ago.  Now patient will continue on medications as currently prescribed.  Follow-up in 6 months or sooner as needed.      Relevant Medications   amLODipine (NORVASC) 5 MG tablet    Return in about 6 months (around 10/09/2022) for f/u with Judson Roch; phone [facility] via telephone if possible.    Ailene Ards, NP

## 2022-04-16 ENCOUNTER — Telehealth: Payer: Self-pay | Admitting: Family Medicine

## 2022-04-16 NOTE — Telephone Encounter (Signed)
Please call pt back to let him know [facility] did receive his order. They are going to have someone follow up with him. He should be on the look out for a call from [facility], thank you

## 2022-04-16 NOTE — Telephone Encounter (Addendum)
CPAP order was sent to [facility] 04/09/22 and they confirmed they received. I sent community message to [facility] to f/u with pt.

## 2022-04-16 NOTE — Telephone Encounter (Signed)
Pt has called to report that he has spoken with DME and they are telling him the reason for the delay is because Amy, NP  has not sent them a new prescription.

## 2022-04-17 ENCOUNTER — Ambulatory Visit (HOSPITAL_COMMUNITY)
Admission: RE | Admit: 2022-04-17 | Discharge: 2022-04-17 | Disposition: A | Discharge status: Home / Self Care | Payer: Medicare Other | Source: Ambulatory Visit | Attending: Vascular Surgery | Admitting: Vascular Surgery

## 2022-04-17 ENCOUNTER — Encounter: Payer: Self-pay | Admitting: Physician Assistant

## 2022-04-17 ENCOUNTER — Other Ambulatory Visit: Payer: Self-pay

## 2022-04-17 ENCOUNTER — Ambulatory Visit (INDEPENDENT_AMBULATORY_CARE_PROVIDER_SITE_OTHER): Payer: Medicare Other | Admitting: Physician Assistant

## 2022-04-17 ENCOUNTER — Other Ambulatory Visit: Payer: Self-pay | Admitting: *Deleted

## 2022-04-17 VITALS — BP 152/63 | HR 60 | Temp 98.2°F | Resp 20 | Ht 66.0 in | Wt 158.6 lb

## 2022-04-17 DIAGNOSIS — I739 Peripheral vascular disease, unspecified: Secondary | ICD-10-CM | POA: Diagnosis not present

## 2022-04-17 DIAGNOSIS — L03119 Cellulitis of unspecified part of limb: Secondary | ICD-10-CM | POA: Diagnosis not present

## 2022-04-17 DIAGNOSIS — L02619 Cutaneous abscess of unspecified foot: Secondary | ICD-10-CM | POA: Diagnosis not present

## 2022-04-17 DIAGNOSIS — I708 Atherosclerosis of other arteries: Secondary | ICD-10-CM

## 2022-04-17 LAB — VAS US ABI WITH/WO TBI
Left ABI: 0.41
Right ABI: 0.21

## 2022-04-17 MED ORDER — TRAMADOL HCL 50 MG PO TABS: 50.0000 mg | ORAL_TABLET | Freq: Four times a day (QID) | ORAL | 0 refills | Status: DC | PRN

## 2022-04-17 MED ORDER — CEPHALEXIN 500 MG PO CAPS: 500.0000 mg | ORAL_CAPSULE | Freq: Two times a day (BID) | ORAL | 0 refills | Status: DC

## 2022-04-17 NOTE — Telephone Encounter (Signed)
Called pt. Left message on VM message to please call office.

## 2022-04-17 NOTE — H&P (View-Only) (Signed)
Office Note     CC:  follow up Requesting Provider:  Ailene Ards, NP  HPI: William [name]. is a 82 y.o. (04-Apr-1940) male who presents with his daughter for routine follow up of PAD. He has history of some claudication but this has not been lifestyle limiting. He reports no changes in regard to his discomfort on ambulation. He does report less walking and mobilization recently with the cold weather. Also in general since [facility] he no longer goes to the [facility] to exercise. His Right foot pain and redness began sort of suddenly about 4 days ago.  This is worse with pressure on the foot especially on ambulation. Does not really bother him when just sitting or lying down. If he does move the foot he says he can reproduce the pain. He does not clearly report rest pain. He says he does wake up at night but generally due to his CPAP machine and then he will notice pain in his foot but he does not feel that the foot is waking him up. He has no tissue loss. Reports no coldness or numbness. He has claudication in left leg but no pain and the claudication is unchanged. He says he lives alone and is really able to do all his ADLs without issues.   He has known chronically occluded left subclavian artery. On evaluation he has not been felt to be candidate for endovascular intervention of this or carotid subclavian bypass. He reports no weakness, numbness, or coldness in left upper arm.   The pt is on a statin for cholesterol management. Is also on Zetia  The pt is on a daily aspirin.   Other AC: Brillinta The pt is on ARB, BB and CCB for hypertension.   The pt is not diabetic.  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[facility])     Myocardial infarction ([place])    Peripheral arterial disease ([place])    Suboptimal revascularization options    Past Surgical History:  Procedure Laterality Date   ABDOMINAL AORTOGRAM W/LOWER EXTREMITY N/A 08/21/2017   Procedure: ABDOMINAL AORTOGRAM W/LOWER EXTREMITY;  Surgeon: Angelia Mould, MD;  Location: [place] [facility];  Service: Cardiovascular;  Laterality: N/A;   Bilateral renal  artery stenoses  04/23/2009   CARDIAC CATHETERIZATION N/A 05/21/2015   Procedure: Left Heart Cath and Coronary Angiography;  Surgeon: Lorretta Harp, MD;  Location: [place] [facility];  Service: Cardiovascular;  Laterality: N/A;   CORONARY ARTERY BYPASS GRAFT N/A 05/22/2015   Procedure: CORONARY ARTERY BYPASS GRAFTING (CABG) LIMA-LAD and SVG-OM EVH RIGHT THIGH GREATER SAPHENOUS VEIN;  Surgeon: [name], MD;  Location: [place];  Service: Open Heart Surgery;  Laterality: N/A;   CORONARY ATHERECTOMY N/A 12/21/2020   Procedure: CORONARY ATHERECTOMY;  Surgeon: Burnell Blanks, MD;  Location: [place] [facility];  Service: Cardiovascular;  Laterality: N/A;   CORONARY BALLOON ANGIOPLASTY N/A 12/06/2020   Procedure: CORONARY BALLOON ANGIOPLASTY;  Surgeon: Burnell Blanks, MD;  Location: [place] [facility];  Service: Cardiovascular;  Laterality: N/A;   CORONARY STENT INTERVENTION N/A 12/21/2020   Procedure: CORONARY STENT INTERVENTION;  Surgeon:  Burnell Blanks, MD;  Location: [place] [facility];  Service: Cardiovascular;  Laterality: N/A;   HIATAL HERNIA REPAIR     INTRAVASCULAR ULTRASOUND/IVUS N/A 12/21/2020   Procedure: Intravascular Ultrasound/IVUS;  Surgeon: Burnell Blanks, MD;  Location: [place] [facility];  Service: Cardiovascular;  Laterality: N/A;   LEFT HEART CATH AND CORS/GRAFTS ANGIOGRAPHY N/A 12/06/2020   Procedure: LEFT HEART CATH AND CORS/GRAFTS ANGIOGRAPHY;  Surgeon: Burnell Blanks, MD;  Location: [place] [facility];  Service: Cardiovascular;  Laterality:  N/A;   Percutaneous translumnial angioplasty  04/23/2009   Catheterization of [place]efst external iliac artery with Left lower extremity runoff   TEE WITHOUT CARDIOVERSION N/A 05/22/2015   Procedure: TRANSESOPHAGEAL ECHOCARDIOGRAM (TEE);  Surgeon: [name], MD;  Location: [place];  Service: Open Heart Surgery;  Laterality: N/A;    Social History   Socioeconomic History   Marital status: Married    Spouse name: Not on file   Number of children: Not on file   Years of education: Not on file   Highest education level: Not on file  Occupational History   Not on file  Tobacco Use   Smoking status: Former    Packs/day: 2.00    Years: 30.00    Total pack years: 60.00    Types: Cigarettes    Start date: 06/03/1961    Quit date: 11/17/1991    Years since quitting: 30.4    Passive exposure: Never   Smokeless tobacco: Former    Types: Snuff, Chew  Vaping Use   Vaping Use: Never used  Substance and Sexual Activity   Alcohol use: Not Currently    Alcohol/week: 2.0 - 4.0 standard drinks of alcohol    Types: 2 - 4 Standard drinks or equivalent per week    Comment: used to, quit 2012   Drug use: No   Sexual activity: Not Currently  Other Topics Concern   Not on file  Social History Narrative   Retired used to work in [employer]. Widower. Lives alone.   Social Determinants of Health   Financial Resource Strain: Low Risk  (12/30/2021)   Overall Financial Resource Strain (CARDIA)    Difficulty of Paying Living Expenses: Not hard at all  Food Insecurity: No Food Insecurity (01/27/2022)   Hunger Vital Sign    Worried About Running Out of Food in the Last Year: Never true    Ran Out of Food in the Last Year: Never true  Transportation Needs: No Transportation Needs (01/27/2022)   PRAPARE - Hydrologist (Medical): No    Lack of Transportation (Non-Medical): No  Physical Activity: Insufficiently Active (01/27/2022)   Exercise Vital Sign    Days of Exercise  per Week: 3 days    Minutes of Exercise per Session: 10 min  Stress: No Stress Concern Present (12/30/2021)   [place]    Feeling of Stress : Not at all  Social Connections: Moderately Integrated (12/30/2021)   Social Connection and Isolation Panel [NHANES]    Frequency of Communication with Friends and Family: More than three times a week    Frequency of Social Gatherings with Friends and Family: More than three times a week    Attends Religious Services: More than 4 times per year    Active Member of [employer] or Organizations: Yes    Attends Archivist Meetings: More than 4 times per year    Marital Status: Widowed  Intimate Partner Violence: Not At Risk (01/27/2022)   Humiliation, Afraid, Rape, and Kick questionnaire    Fear of Current or Ex-Partner: No    Emotionally Abused: No    Physically Abused: No    Sexually Abused: No    Family History  Problem Relation Age of Onset   Heart disease Father        Heart Disease before age 77   Pulmonary embolism Father    Deep vein thrombosis Father    Cancer Mother        Sarcoma    Current Outpatient Medications  Medication Sig Dispense Refill   amLODipine (NORVASC) 5 MG tablet Take 2 tablets (10 mg total) by mouth daily. 90 tablet 3   Ascorbic Acid (VITAMIN C) 1000 MG tablet Take 1,000 mg by mouth in the morning.     aspirin EC 81 MG tablet Take 1 tablet (81 mg total) by mouth daily.     budesonide-formoterol (SYMBICORT) 80-4.5 MCG/ACT inhaler Take 2 puffs first thing in am and then another 2 puffs about 12 hours later. 1 each 11   cholecalciferol (VITAMIN D) 25 MCG (1000 UNIT) tablet Take 1,000 Units by mouth in the morning.     ezetimibe (ZETIA) 10 MG tablet TAKE 1 TABLET BY MOUTH EVERY DAY 90 tablet 3   famotidine (PEPCID) 20 MG tablet Take 20 mg by mouth every evening.     fish oil-omega-3 fatty acids 1000 MG capsule Take 1,000 mg by mouth in the morning.      fluticasone (FLONASE) 50 MCG/ACT nasal spray Place 2 sprays into both nostrils daily. 16 g 6   folic acid (FOLVITE) 914 MCG tablet Take 400 mcg by mouth in the morning.     Garlic 7829 MG TBEC Take 2,000 mg by mouth in the morning.     isosorbide mononitrate (IMDUR) 60 MG 24 hr tablet TAKE 1 TABLET BY MOUTH EVERY DAY 90 tablet 1   metoprolol tartrate (LOPRESSOR) 50 MG tablet TAKE 1 TABLET BY MOUTH TWICE A DAY 180 tablet 1   Multiple Vitamin (MULTIVITAMIN WITH MINERALS) TABS tablet Take 1 tablet by mouth in the morning.     nitroGLYCERIN (NITROSTAT) 0.4 MG SL tablet Place 1 tablet (0.4 mg total) under the tongue every 5 (five) minutes as needed for chest pain. If pain does not resolve with rest. 50 tablet 3   NP THYROID 30 MG tablet TAKE 1 TABLET (30 MG TOTAL) BY MOUTH DAILY BEFORE BREAKFAST. 90 tablet 1   omeprazole (PRILOSEC OTC) 20 MG tablet Take 1 tablet (20 mg total) by mouth daily. Take 30-60 min before first meal of the day     rosuvastatin (CRESTOR) 10 MG tablet TAKE 1 TABLET BY MOUTH EVERY DAY N 90 tablet 3   thiamine (VITAMIN B-1) 100 MG tablet Take 100 mg by mouth every evening.      ticagrelor (BRILINTA) 90 MG TABS tablet TAKE 1 TABLET BY MOUTH TWICE A DAY 60 tablet 3   valsartan (DIOVAN) 160 MG tablet TAKE 1 TABLET BY MOUTH EVERY DAY 30 tablet 3   No current facility-administered medications for this visit.    No Known Allergies   REVIEW OF SYSTEMS:   '[X]'$  denotes positive finding, '[ ]'$  denotes negative finding Cardiac  Comments:  Chest pain or chest pressure:    Shortness of breath upon exertion:    Short of breath when lying flat:    Irregular heart rhythm:        Vascular  Pain in calf, thigh, or hip brought on by ambulation:    Pain in feet at night that wakes you up from your sleep:     Blood clot in your veins:    Leg swelling:         Pulmonary    Oxygen at home:    Productive cough:     Wheezing:         Neurologic    Sudden weakness in arms or legs:      Sudden numbness in arms or legs:     Sudden onset of difficulty speaking or slurred speech:    Temporary loss of vision in one eye:     Problems with dizziness:         Gastrointestinal    Blood in stool:     Vomited blood:         Genitourinary    Burning when urinating:     Blood in urine:        Psychiatric    Major depression:         Hematologic    Bleeding problems:    Problems with blood clotting too easily:        Skin    Rashes or ulcers:        Constitutional    Fever or chills:      PHYSICAL EXAMINATION:  There were no vitals filed for this visit.  General:  WDWN in NAD; vital signs documented above Gait: limping gait, favoring right foot HENT: WNL, normocephalic Pulmonary: normal non-labored breathing without wheezing Cardiac: regular HR, without  Murmurs without carotid bruit Abdomen: soft, distended, soft Vascular Exam/Pulses:  Right Left  Radial 2+ (normal) 2+ (normal)  Femoral 2+ (normal) 1+ (weak)  Popliteal absent absent  DP absent absent  PT absent absent   Extremities: without ischemic changes, without Gangrene , with cellulitis of dorsum of right foot, some tenderness to palpation, mild edema; without open wounds;  Musculoskeletal: no muscle wasting or atrophy  Neurologic: A&O X 3;  No focal weakness or paresthesias are detected Psychiatric:  The pt has Normal affect.   Non-Invasive Vascular Imaging:   +-------+-----------+-----------+------------+------------+  ABI/TBIToday's ABIToday's TBIPrevious ABIPrevious TBI  +-------+-----------+-----------+------------+------------+  Right 0.21       absent     0.30        absent        +-------+-----------+-----------+------------+------------+  Left  0.41       0.25       0.42        0.21          +-------+-----------+-----------+------------+------------+    ASSESSMENT/PLAN:: 82 y.o. male here for follow up for PAD. He has known right CF artery occlusion, bilateral SFA  occlusions, left popliteal occlusion and severe tibial disease bilaterally form prior Angiogram in 2019. He has had bilateral claudication for many years but this has not been lifestyle limiting. He now has acute right foot pain with some swelling and erythema. I discussed with patient and his daughter options for treating suspected cellulitis, giving him some Tramadol for as needed severe pain, and restarting exercise regimen vs angiogram as 1st step in need for likely endarterectomy and/or surgical bypass. At this time he does not have clear rest pain and does not have any tissue loss. His ABIs are essentially unchanged from 1 year ago.  My recommendation is to try conservative management at this time especially with his cardiac history.  -Tramadol #16 no refills and Keflex 500 mg  BID #28 sent to patients pharmacy - he can continue his Aspirin, Statin and Brillinta at this time - He will follow up in 4 weeks for close interval follow up, however discussed with patient and his daughter that if his symptoms worsen or if he decides he would like to proceed with Angiogram he can call and this can be arranged over the phone  Karoline Caldwell, PA-C [facility] [phone]  Clinic MD:   Scot Dock

## 2022-04-17 NOTE — Progress Notes (Signed)
Office Note     CC:  follow up Requesting Provider:  Ailene Ards, NP  HPI: William [name]. is a 82 y.o. (11/14/1940) male who presents with his daughter for routine follow up of PAD. He has history of some claudication but this has not been lifestyle limiting. He reports no changes in regard to his discomfort on ambulation. He does report less walking and mobilization recently with the cold weather. Also in general since [place] he no longer goes to the [facility] to exercise. His Right foot pain and redness began sort of suddenly about 4 days ago.  This is worse with pressure on the foot especially on ambulation. Does not really bother him when just sitting or lying down. If he does move the foot he says he can reproduce the pain. He does not clearly report rest pain. He says he does wake up at night but generally due to his CPAP machine and then he will notice pain in his foot but he does not feel that the foot is waking him up. He has no tissue loss. Reports no coldness or numbness. He has claudication in left leg but no pain and the claudication is unchanged. He says he lives alone and is really able to do all his ADLs without issues.   He has known chronically occluded left subclavian artery. On evaluation he has not been felt to be candidate for endovascular intervention of this or carotid subclavian bypass. He reports no weakness, numbness, or coldness in left upper arm.   The pt is on a statin for cholesterol management. Is also on Zetia  The pt is on a daily aspirin.   Other AC: Brillinta The pt is on ARB, BB and CCB for hypertension.   The pt is not diabetic.  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[facility])     Myocardial infarction ([place])    Peripheral arterial disease ([place])    Suboptimal revascularization options    Past Surgical History:  Procedure Laterality Date   ABDOMINAL AORTOGRAM W/LOWER EXTREMITY N/A 08/21/2017   Procedure: ABDOMINAL AORTOGRAM W/LOWER EXTREMITY;  Surgeon: Angelia Mould, MD;  Location: [place] [facility];  Service: Cardiovascular;  Laterality: N/A;   Bilateral renal  artery stenoses  04/23/2009   CARDIAC CATHETERIZATION N/A 05/21/2015   Procedure: Left Heart Cath and Coronary Angiography;  Surgeon: Lorretta Harp, MD;  Location: [place] [facility];  Service: Cardiovascular;  Laterality: N/A;   CORONARY ARTERY BYPASS GRAFT N/A 05/22/2015   Procedure: CORONARY ARTERY BYPASS GRAFTING (CABG) LIMA-LAD and SVG-OM EVH RIGHT THIGH GREATER SAPHENOUS VEIN;  Surgeon: [name], MD;  Location: [place];  Service: Open Heart Surgery;  Laterality: N/A;   CORONARY ATHERECTOMY N/A 12/21/2020   Procedure: CORONARY ATHERECTOMY;  Surgeon: Burnell Blanks, MD;  Location: [place] [facility];  Service: Cardiovascular;  Laterality: N/A;   CORONARY BALLOON ANGIOPLASTY N/A 12/06/2020   Procedure: CORONARY BALLOON ANGIOPLASTY;  Surgeon: Burnell Blanks, MD;  Location: [place] [facility];  Service: Cardiovascular;  Laterality: N/A;   CORONARY STENT INTERVENTION N/A 12/21/2020   Procedure: CORONARY STENT INTERVENTION;  Surgeon:  Burnell Blanks, MD;  Location: [place] [facility];  Service: Cardiovascular;  Laterality: N/A;   HIATAL HERNIA REPAIR     INTRAVASCULAR ULTRASOUND/IVUS N/A 12/21/2020   Procedure: Intravascular Ultrasound/IVUS;  Surgeon: Burnell Blanks, MD;  Location: [place] [facility];  Service: Cardiovascular;  Laterality: N/A;   LEFT HEART CATH AND CORS/GRAFTS ANGIOGRAPHY N/A 12/06/2020   Procedure: LEFT HEART CATH AND CORS/GRAFTS ANGIOGRAPHY;  Surgeon: Burnell Blanks, MD;  Location: [place] [facility];  Service: Cardiovascular;  Laterality:  N/A;   Percutaneous translumnial angioplasty  04/23/2009   Catheterization of [place] external iliac artery with Left lower extremity runoff   TEE WITHOUT CARDIOVERSION N/A 05/22/2015   Procedure: TRANSESOPHAGEAL ECHOCARDIOGRAM (TEE);  Surgeon: [name], MD;  Location: [place];  Service: Open Heart Surgery;  Laterality: N/A;    Social History   Socioeconomic History   Marital status: Married    Spouse name: Not on file   Number of children: Not on file   Years of education: Not on file   Highest education level: Not on file  Occupational History   Not on file  Tobacco Use   Smoking status: Former    Packs/day: 2.00    Years: 30.00    Total pack years: 60.00    Types: Cigarettes    Start date: 06/03/1961    Quit date: 11/17/1991    Years since quitting: 30.4    Passive exposure: Never   Smokeless tobacco: Former    Types: Snuff, Chew  Vaping Use   Vaping Use: Never used  Substance and Sexual Activity   Alcohol use: Not Currently    Alcohol/week: 2.0 - 4.0 standard drinks of alcohol    Types: 2 - 4 Standard drinks or equivalent per week    Comment: used to, quit 2012   Drug use: No   Sexual activity: Not Currently  Other Topics Concern   Not on file  Social History Narrative   Retired used to work in [employer]. Widower. Lives alone.   Social Determinants of Health   Financial Resource Strain: Low Risk  (12/30/2021)   Overall Financial Resource Strain (CARDIA)    Difficulty of Paying Living Expenses: Not hard at all  Food Insecurity: No Food Insecurity (01/27/2022)   Hunger Vital Sign    Worried About Running Out of Food in the Last Year: Never true    Ran Out of Food in the Last Year: Never true  Transportation Needs: No Transportation Needs (01/27/2022)   PRAPARE - Hydrologist (Medical): No    Lack of Transportation (Non-Medical): No  Physical Activity: Insufficiently Active (01/27/2022)   Exercise Vital Sign    Days of Exercise  per Week: 3 days    Minutes of Exercise per Session: 10 min  Stress: No Stress Concern Present (12/30/2021)   [place]    Feeling of Stress : Not at all  Social Connections: Moderately Integrated (12/30/2021)   Social Connection and Isolation Panel [NHANES]    Frequency of Communication with Friends and Family: More than three times a week    Frequency of Social Gatherings with Friends and Family: More than three times a week    Attends Religious Services: More than 4 times per year    Active Member of [employer] or Organizations: Yes    Attends Archivist Meetings: More than 4 times per year    Marital Status: Widowed  Intimate Partner Violence: Not At Risk (01/27/2022)   Humiliation, Afraid, Rape, and Kick questionnaire    Fear of Current or Ex-Partner: No    Emotionally Abused: No    Physically Abused: No    Sexually Abused: No    Family History  Problem Relation Age of Onset   Heart disease Father        Heart Disease before age 70   Pulmonary embolism Father    Deep vein thrombosis Father    Cancer Mother        Sarcoma    Current Outpatient Medications  Medication Sig Dispense Refill   amLODipine (NORVASC) 5 MG tablet Take 2 tablets (10 mg total) by mouth daily. 90 tablet 3   Ascorbic Acid (VITAMIN C) 1000 MG tablet Take 1,000 mg by mouth in the morning.     aspirin EC 81 MG tablet Take 1 tablet (81 mg total) by mouth daily.     budesonide-formoterol (SYMBICORT) 80-4.5 MCG/ACT inhaler Take 2 puffs first thing in am and then another 2 puffs about 12 hours later. 1 each 11   cholecalciferol (VITAMIN D) 25 MCG (1000 UNIT) tablet Take 1,000 Units by mouth in the morning.     ezetimibe (ZETIA) 10 MG tablet TAKE 1 TABLET BY MOUTH EVERY DAY 90 tablet 3   famotidine (PEPCID) 20 MG tablet Take 20 mg by mouth every evening.     fish oil-omega-3 fatty acids 1000 MG capsule Take 1,000 mg by mouth in the morning.      fluticasone (FLONASE) 50 MCG/ACT nasal spray Place 2 sprays into both nostrils daily. 16 g 6   folic acid (FOLVITE) 756 MCG tablet Take 400 mcg by mouth in the morning.     Garlic 4332 MG TBEC Take 2,000 mg by mouth in the morning.     isosorbide mononitrate (IMDUR) 60 MG 24 hr tablet TAKE 1 TABLET BY MOUTH EVERY DAY 90 tablet 1   metoprolol tartrate (LOPRESSOR) 50 MG tablet TAKE 1 TABLET BY MOUTH TWICE A DAY 180 tablet 1   Multiple Vitamin (MULTIVITAMIN WITH MINERALS) TABS tablet Take 1 tablet by mouth in the morning.     nitroGLYCERIN (NITROSTAT) 0.4 MG SL tablet Place 1 tablet (0.4 mg total) under the tongue every 5 (five) minutes as needed for chest pain. If pain does not resolve with rest. 50 tablet 3   NP THYROID 30 MG tablet TAKE 1 TABLET (30 MG TOTAL) BY MOUTH DAILY BEFORE BREAKFAST. 90 tablet 1   omeprazole (PRILOSEC OTC) 20 MG tablet Take 1 tablet (20 mg total) by mouth daily. Take 30-60 min before first meal of the day     rosuvastatin (CRESTOR) 10 MG tablet TAKE 1 TABLET BY MOUTH EVERY DAY N 90 tablet 3   thiamine (VITAMIN B-1) 100 MG tablet Take 100 mg by mouth every evening.      ticagrelor (BRILINTA) 90 MG TABS tablet TAKE 1 TABLET BY MOUTH TWICE A DAY 60 tablet 3   valsartan (DIOVAN) 160 MG tablet TAKE 1 TABLET BY MOUTH EVERY DAY 30 tablet 3   No current facility-administered medications for this visit.    No Known Allergies   REVIEW OF SYSTEMS:   '[X]'$  denotes positive finding, '[ ]'$  denotes negative finding Cardiac  Comments:  Chest pain or chest pressure:    Shortness of breath upon exertion:    Short of breath when lying flat:    Irregular heart rhythm:        Vascular  Pain in calf, thigh, or hip brought on by ambulation:    Pain in feet at night that wakes you up from your sleep:     Blood clot in your veins:    Leg swelling:         Pulmonary    Oxygen at home:    Productive cough:     Wheezing:         Neurologic    Sudden weakness in arms or legs:      Sudden numbness in arms or legs:     Sudden onset of difficulty speaking or slurred speech:    Temporary loss of vision in one eye:     Problems with dizziness:         Gastrointestinal    Blood in stool:     Vomited blood:         Genitourinary    Burning when urinating:     Blood in urine:        Psychiatric    Major depression:         Hematologic    Bleeding problems:    Problems with blood clotting too easily:        Skin    Rashes or ulcers:        Constitutional    Fever or chills:      PHYSICAL EXAMINATION:  There were no vitals filed for this visit.  General:  WDWN in NAD; vital signs documented above Gait: limping gait, favoring right foot HENT: WNL, normocephalic Pulmonary: normal non-labored breathing without wheezing Cardiac: regular HR, without  Murmurs without carotid bruit Abdomen: soft, distended, soft Vascular Exam/Pulses:  Right Left  Radial 2+ (normal) 2+ (normal)  Femoral 2+ (normal) 1+ (weak)  Popliteal absent absent  DP absent absent  PT absent absent   Extremities: without ischemic changes, without Gangrene , with cellulitis of dorsum of right foot, some tenderness to palpation, mild edema; without open wounds;  Musculoskeletal: no muscle wasting or atrophy  Neurologic: A&O X 3;  No focal weakness or paresthesias are detected Psychiatric:  The pt has Normal affect.   Non-Invasive Vascular Imaging:   +-------+-----------+-----------+------------+------------+  ABI/TBIToday's ABIToday's TBIPrevious ABIPrevious TBI  +-------+-----------+-----------+------------+------------+  Right 0.21       absent     0.30        absent        +-------+-----------+-----------+------------+------------+  Left  0.41       0.25       0.42        0.21          +-------+-----------+-----------+------------+------------+    ASSESSMENT/PLAN:: 82 y.o. male here for follow up for PAD. He has known right CF artery occlusion, bilateral SFA  occlusions, left popliteal occlusion and severe tibial disease bilaterally form prior Angiogram in 2019. He has had bilateral claudication for many years but this has not been lifestyle limiting. He now has acute right foot pain with some swelling and erythema. I discussed with patient and his daughter options for treating suspected cellulitis, giving him some Tramadol for as needed severe pain, and restarting exercise regimen vs angiogram as 1st step in need for likely endarterectomy and/or surgical bypass. At this time he does not have clear rest pain and does not have any tissue loss. His ABIs are essentially unchanged from 1 year ago.  My recommendation is to try conservative management at this time especially with his cardiac history.  -Tramadol #16 no refills and Keflex 500 mg  BID #28 sent to patients pharmacy - he can continue his Aspirin, Statin and Brillinta at this time - He will follow up in 4 weeks for close interval follow up, however discussed with patient and his daughter that if his symptoms worsen or if he decides he would like to proceed with Angiogram he can call and this can be arranged over the phone  Karoline Caldwell, PA-C [facility] [phone]  Clinic MD:   Scot Dock

## 2022-04-25 ENCOUNTER — Encounter (HOSPITAL_COMMUNITY)
Admission: RE | Disposition: A | Discharge status: Home / Self Care | Payer: Self-pay | Source: Ambulatory Visit | Attending: Vascular Surgery

## 2022-04-25 ENCOUNTER — Other Ambulatory Visit: Payer: Self-pay

## 2022-04-25 ENCOUNTER — Ambulatory Visit (HOSPITAL_COMMUNITY)
Admission: RE | Admit: 2022-04-25 | Discharge: 2022-04-25 | Disposition: A | Discharge status: Home / Self Care | Payer: Medicare Other | Source: Ambulatory Visit | Attending: Vascular Surgery | Admitting: Vascular Surgery

## 2022-04-25 DIAGNOSIS — Z7982 Long term (current) use of aspirin: Secondary | ICD-10-CM | POA: Diagnosis not present

## 2022-04-25 DIAGNOSIS — I70221 Atherosclerosis of native arteries of extremities with rest pain, right leg: Secondary | ICD-10-CM | POA: Insufficient documentation

## 2022-04-25 DIAGNOSIS — I252 Old myocardial infarction: Secondary | ICD-10-CM | POA: Diagnosis not present

## 2022-04-25 DIAGNOSIS — Z7902 Long term (current) use of antithrombotics/antiplatelets: Secondary | ICD-10-CM | POA: Diagnosis not present

## 2022-04-25 DIAGNOSIS — Z87891 Personal history of nicotine dependence: Secondary | ICD-10-CM | POA: Insufficient documentation

## 2022-04-25 DIAGNOSIS — N189 Chronic kidney disease, unspecified: Secondary | ICD-10-CM | POA: Insufficient documentation

## 2022-04-25 DIAGNOSIS — E785 Hyperlipidemia, unspecified: Secondary | ICD-10-CM | POA: Insufficient documentation

## 2022-04-25 DIAGNOSIS — I129 Hypertensive chronic kidney disease with stage 1 through stage 4 chronic kidney disease, or unspecified chronic kidney disease: Secondary | ICD-10-CM | POA: Diagnosis not present

## 2022-04-25 DIAGNOSIS — M79671 Pain in right foot: Secondary | ICD-10-CM | POA: Diagnosis not present

## 2022-04-25 DIAGNOSIS — Z79899 Other long term (current) drug therapy: Secondary | ICD-10-CM | POA: Diagnosis not present

## 2022-04-25 DIAGNOSIS — I739 Peripheral vascular disease, unspecified: Secondary | ICD-10-CM

## 2022-04-25 HISTORY — PX: ABDOMINAL AORTOGRAM W/LOWER EXTREMITY: CATH118223

## 2022-04-25 LAB — POCT I-STAT, CHEM 8
BUN: 41 mg/dL — ABNORMAL HIGH (ref 8–23)
Calcium, Ion: 1.19 mmol/L (ref 1.15–1.40)
Chloride: 106 mmol/L (ref 98–111)
Creatinine, Ser: 2.2 mg/dL — ABNORMAL HIGH (ref 0.61–1.24)
Glucose, Bld: 96 mg/dL (ref 70–99)
HCT: 38 % — ABNORMAL LOW (ref 39.0–52.0)
Hemoglobin: 12.9 g/dL — ABNORMAL LOW (ref 13.0–17.0)
Potassium: 5.1 mmol/L (ref 3.5–5.1)
Sodium: 138 mmol/L (ref 135–145)
TCO2: 19 mmol/L — ABNORMAL LOW (ref 22–32)

## 2022-04-25 IMAGING — CT CT CHEST W/O CM
2 of 4 series · 13 of 36 positions shown, 16 images · non-contrast
Comparison: Chest radiograph 10/11/2020

CLINICAL DATA: Abnormal xray - lung opacity/opacities

EXAM:
CT CHEST WITHOUT CONTRAST
TECHNIQUE: Multidetector CT imaging of the chest was performed following the
standard protocol without IV contrast.

[Series 2: routine chest without · axial · non-contrast · 0.76mm/px · z∈[+919,+1155]mm · 10 of 140 slices shown, 13 images]
[im 11/140  mediastinal]
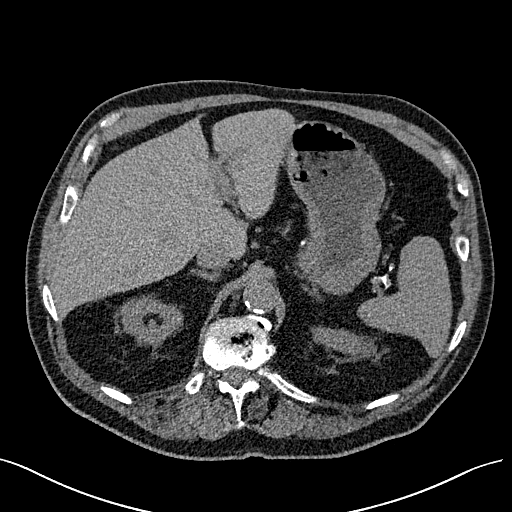
[im 11/140  lung]
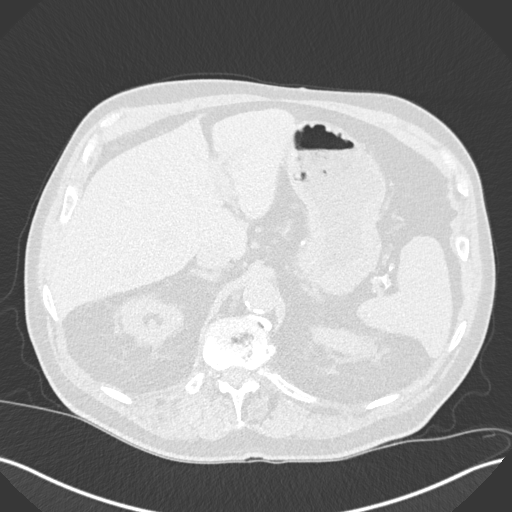
[im 22/140  lung]
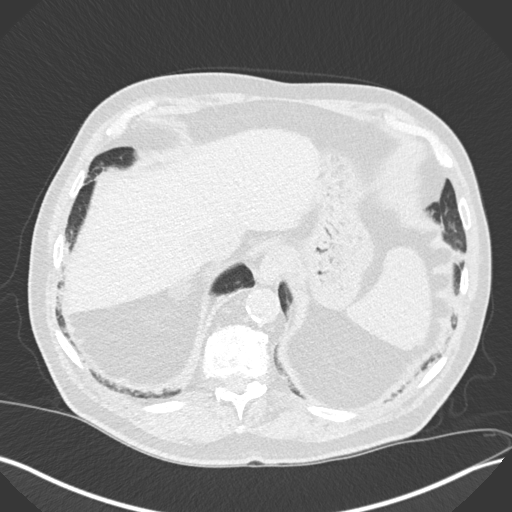
[im 43/140  lung]
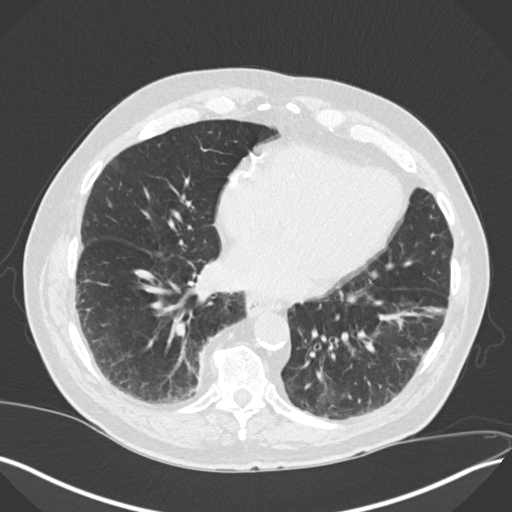
[im 54/140  lung]
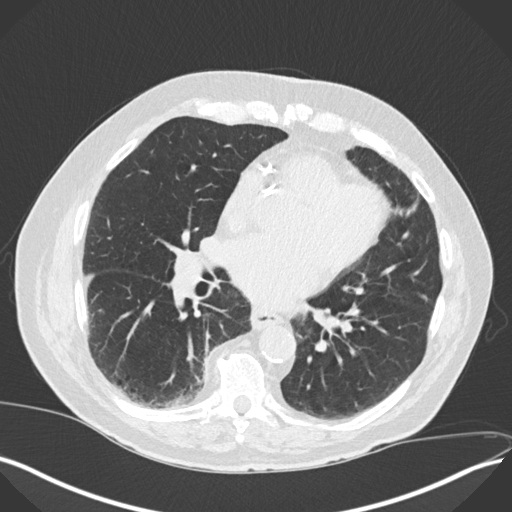
[im 65/140  mediastinal]
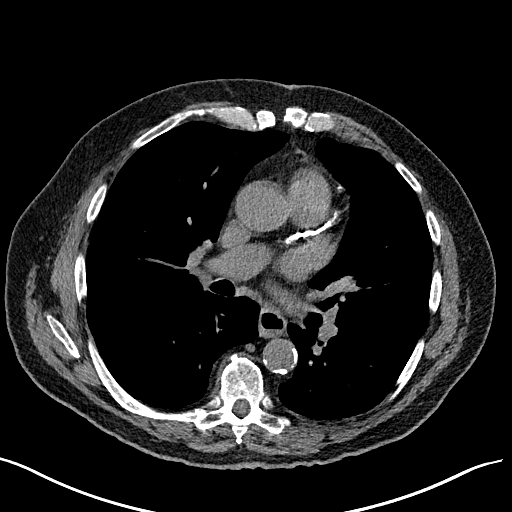
[im 65/140  lung]
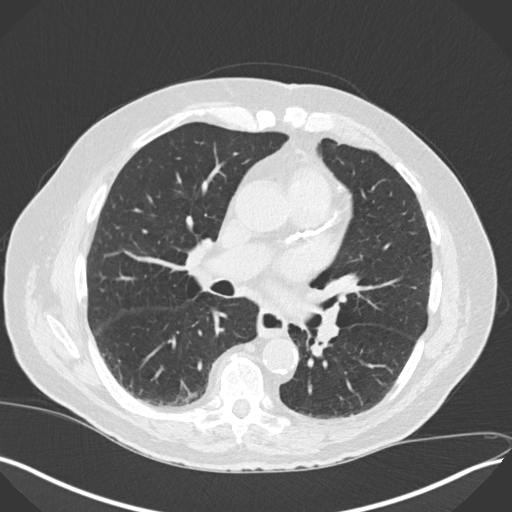
[im 75/140  lung]
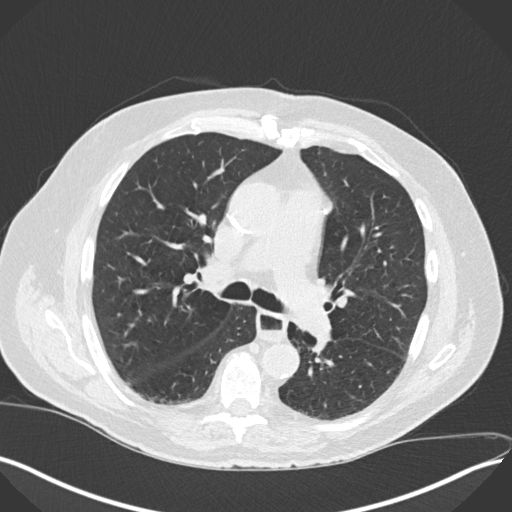
[im 86/140  lung]
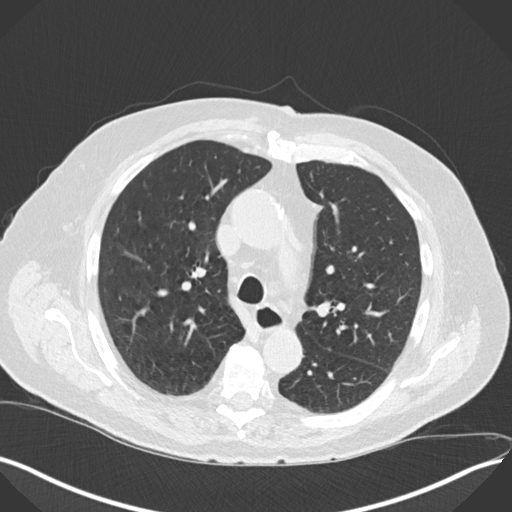
[im 107/140  lung]
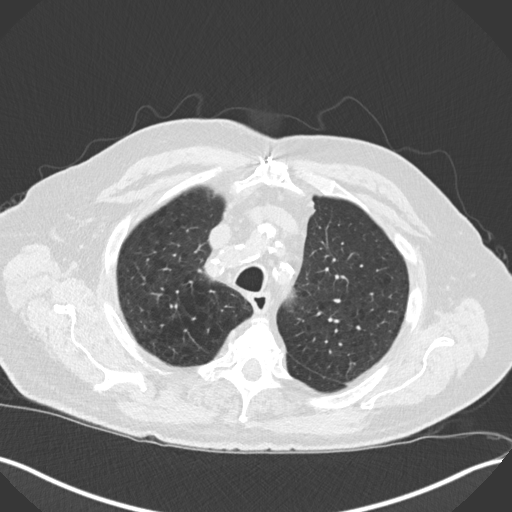
[im 118/140  mediastinal]
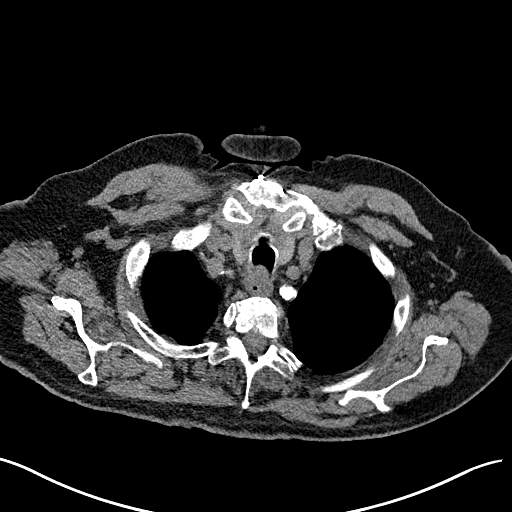
[im 118/140  lung]
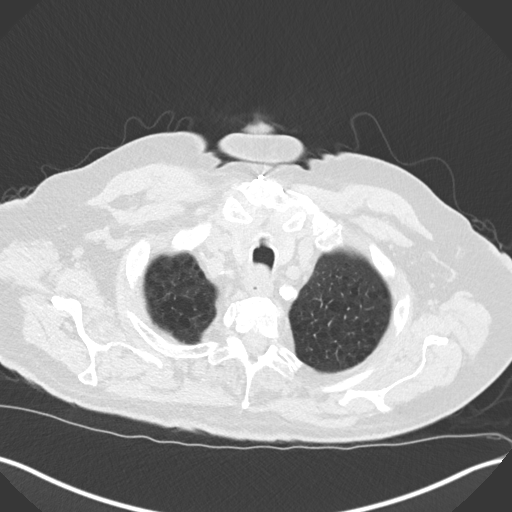
[im 129/140  lung]
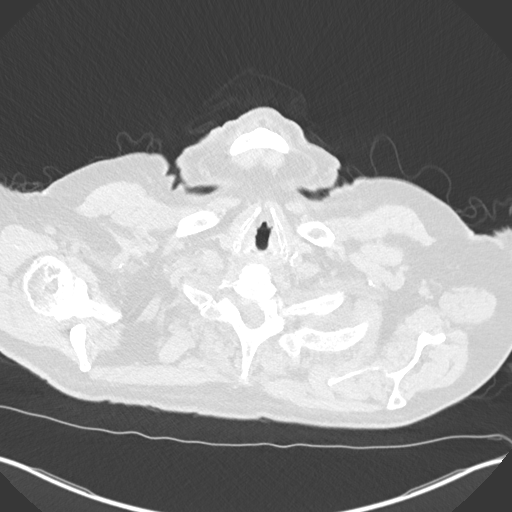

[Series 5: coronal · coronal · 0.58mm/px · 3 of 165 slices shown]
[im 33/165  lung]
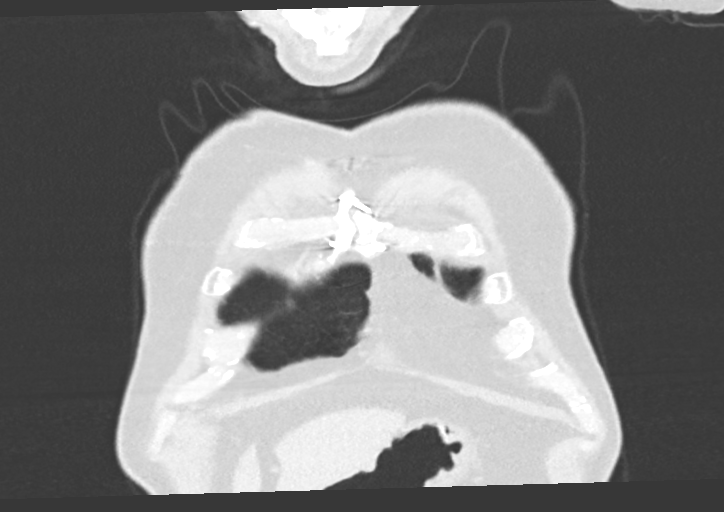
[im 66/165  lung]
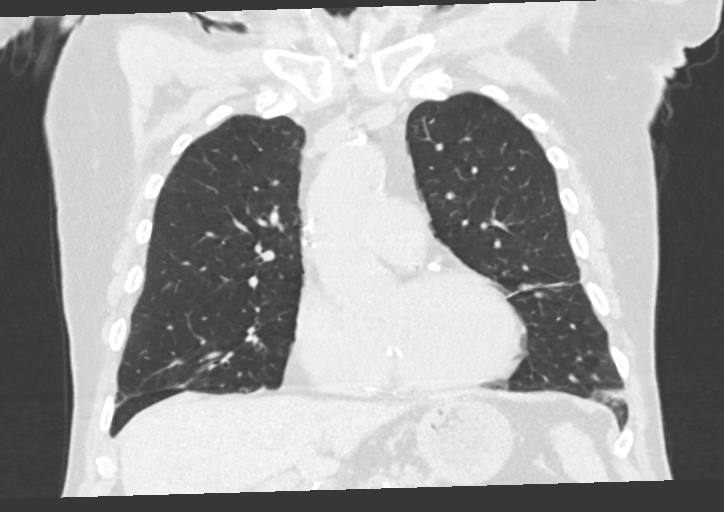
[im 99/165  lung]
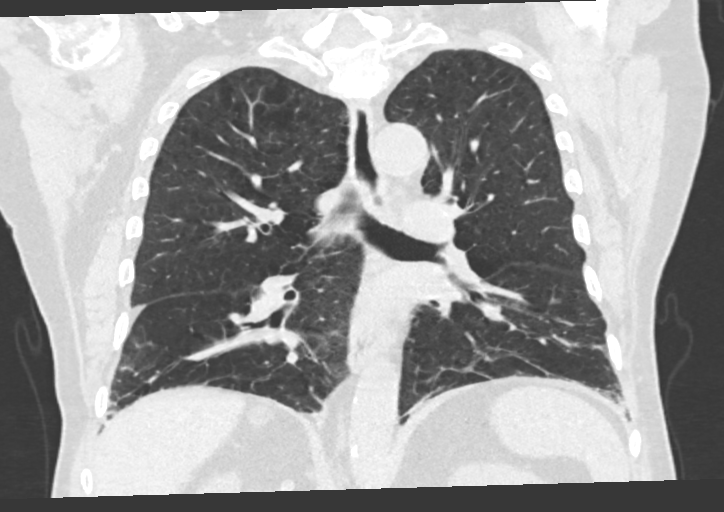

[13 of 36 positions shown; findings below may reference images not displayed]

FINDINGS: Cardiovascular: Post median sternotomy and CABG with dense
calcification of native coronary arteries. Moderate aortic
atherosclerosis. Mild fusiform aneurysmal dilatation of the
ascending aorta, maximal dimension 4 cm, measured on series 2, image
63. Dense atherosclerosis at the great vessel origins, likely
causing hemodynamically significant stenosis in the left subclavian.
The heart is normal in size. No pericardial effusion.

Mediastinum/Nodes: Scattered mediastinal lymph nodes are not
enlarged by size criteria. There is no bulky hilar adenopathy,
limited assessment on this unenhanced exam. Esophagus is patulous
with tiny hiatal hernia. Small fluid level in the mid esophagus. No
suspicious thyroid nodule.

Lungs/Pleura: Moderate centrilobular emphysema, apical predominant.
Linear/bandlike opacity in the lingula is typical of atelectasis or
scarring. There also linear opacities within the left and right
lower lobe, also typical of atelectasis or scarring. Mild subpleural
reticulation and ground-glass opacity in the periphery of the lung
bases, nonspecific. No confluent airspace disease or evidence of
pneumonia. No findings of pulmonary edema. No pleural fluid. No
pulmonary mass or discrete nodule, basilar assessment is slightly
limited by breathing motion artifact. Trachea and central bronchi
are patent.

Upper Abdomen: Aortic and branch atherosclerosis in the upper
abdomen. No acute findings.

Musculoskeletal: Median sternotomy. Diffuse degenerative change
throughout the thoracic spine. Advanced right glenohumeral
arthropathy, partially included There are no acute or suspicious
osseous abnormalities.
IMPRESSION: 1. Mild subpleural reticulation and ground-glass opacity in the
periphery of the lung bases, nonspecific. This may be secondary to
post infectious/inflammatory scarring, however interstitial lung
disease is not excluded. Consider follow-up high-resolution chest CT
in 6-12 months to assess for temporal change.
2. Linear and bandlike opacities within the lingula and both lower
lobes typical of atelectasis and/or scarring. No evidence of
pneumonia or neoplasm.
3. Moderate emphysema.
4. Patulous esophagus with tiny hiatal hernia.
5. Advanced aortic atherosclerosis. Fusiform aneurysmal dilatation
of the ascending aorta at 4 cm. Recommend annual imaging followup by
CTA or MRA. This recommendation follows 2020
ACCF/AHA/AATS/ACR/ASA/SCA/POLIN/GIOVANA PATRICIA/RASIDI/MALMGREN Guidelines for the
Diagnosis and Management of Patients with Thoracic Aortic Disease.
Circulation. 2020; 121: E266-e369. Aortic aneurysm NOS (KUWIA-OS5.T)
6. Dense calcification in the proximal left subclavian likely causes
hemodynamic significant stenosis. Post CABG with calcification of
native coronary arteries.

Aortic Atherosclerosis (KUWIA-V4W.W) and Emphysema (KUWIA-HVK.2).

## 2022-04-25 SURGERY — ABDOMINAL AORTOGRAM W/LOWER EXTREMITY
Anesthesia: LOCAL

## 2022-04-25 MED ORDER — MIDAZOLAM HCL 2 MG/2ML IJ SOLN
INTRAMUSCULAR | Status: AC
  Filled 2022-04-25: qty 2

## 2022-04-25 MED ORDER — HEPARIN (PORCINE) IN NACL 1000-0.9 UT/500ML-% IV SOLN
INTRAVENOUS | Status: DC | PRN
  Administered 2022-04-25 (×2): 500 mL

## 2022-04-25 MED ORDER — SODIUM CHLORIDE 0.9% FLUSH: 3.0000 mL | INTRAVENOUS | Status: DC | PRN

## 2022-04-25 MED ORDER — FENTANYL CITRATE (PF) 100 MCG/2ML IJ SOLN
INTRAMUSCULAR | Status: DC | PRN
  Administered 2022-04-25: 50 ug via INTRAVENOUS

## 2022-04-25 MED ORDER — LIDOCAINE HCL (PF) 1 % IJ SOLN
INTRAMUSCULAR | Status: DC | PRN
  Administered 2022-04-25: 10 mL

## 2022-04-25 MED ORDER — LABETALOL HCL 5 MG/ML IV SOLN: 10.0000 mg | INTRAVENOUS | Status: DC | PRN

## 2022-04-25 MED ORDER — IODIXANOL 320 MG/ML IV SOLN
INTRAVENOUS | Status: DC | PRN
  Administered 2022-04-25: 5 mL via INTRA_ARTERIAL

## 2022-04-25 MED ORDER — HEPARIN (PORCINE) IN NACL 1000-0.9 UT/500ML-% IV SOLN
INTRAVENOUS | Status: AC
  Filled 2022-04-25: qty 1000

## 2022-04-25 MED ORDER — SODIUM CHLORIDE 0.9 % IV SOLN: 250.0000 mL | INTRAVENOUS | Status: DC | PRN

## 2022-04-25 MED ORDER — HYDRALAZINE HCL 20 MG/ML IJ SOLN: 5.0000 mg | INTRAMUSCULAR | Status: DC | PRN

## 2022-04-25 MED ORDER — FENTANYL CITRATE (PF) 100 MCG/2ML IJ SOLN
INTRAMUSCULAR | Status: AC
  Filled 2022-04-25: qty 2

## 2022-04-25 MED ORDER — LIDOCAINE HCL (PF) 1 % IJ SOLN
INTRAMUSCULAR | Status: AC
  Filled 2022-04-25: qty 30

## 2022-04-25 MED ORDER — SODIUM CHLORIDE 0.9 % IV SOLN: INTRAVENOUS | Status: DC

## 2022-04-25 MED ORDER — SODIUM CHLORIDE 0.9% FLUSH: 3.0000 mL | Freq: Two times a day (BID) | INTRAVENOUS | Status: DC

## 2022-04-25 MED ORDER — ONDANSETRON HCL 4 MG/2ML IJ SOLN: 4.0000 mg | Freq: Four times a day (QID) | INTRAMUSCULAR | Status: DC | PRN

## 2022-04-25 MED ORDER — MIDAZOLAM HCL 2 MG/2ML IJ SOLN
INTRAMUSCULAR | Status: DC | PRN
  Administered 2022-04-25: 1 mg via INTRAVENOUS

## 2022-04-25 MED ORDER — SODIUM CHLORIDE 0.9 % WEIGHT BASED INFUSION: 1.0000 mL/kg/h | INTRAVENOUS | Status: DC

## 2022-04-25 MED ORDER — ACETAMINOPHEN 325 MG PO TABS: 650.0000 mg | ORAL_TABLET | ORAL | Status: DC | PRN

## 2022-04-25 SURGICAL SUPPLY — 12 items
CATH ANGIO 5F BER2 65CM (CATHETERS) IMPLANT
CATH ANGIO 5F PIGTAIL 65CM (CATHETERS) IMPLANT
CLOSURE MYNX CONTROL 5F (Vascular Products) IMPLANT
KIT ANGIASSIST CO2 SYSTEM (KITS) IMPLANT
KIT MICROPUNCTURE NIT STIFF (SHEATH) IMPLANT
KIT PV (KITS) ×2 IMPLANT
SHEATH PINNACLE 5F 10CM (SHEATH) IMPLANT
SHEATH PROBE COVER 6X72 (BAG) IMPLANT
SYR MEDRAD MARK 7 150ML (SYRINGE) ×2 IMPLANT
TRANSDUCER W/STOPCOCK (MISCELLANEOUS) ×2 IMPLANT
TRAY PV CATH (CUSTOM PROCEDURE TRAY) ×2 IMPLANT
WIRE BENTSON .035X145CM (WIRE) IMPLANT

## 2022-04-25 NOTE — Op Note (Signed)
PATIENT: William [name].      MRN: [id number] DOB: June 17, 1940    DATE OF PROCEDURE: 04/25/2022  INDICATIONS:    William [name]. is a 82 y.o. male presented to the office with pain in the right foot.  He was set up for arteriography.  He has known multilevel arterial occlusive disease.  He is undergone PTCA in September 2022 and has a history of an MI in the past.  He also has chronic kidney disease.  His creatinine today was 2.2.  PROCEDURE:    Conscious sedation  Ultrasound-guided access to the left common femoral artery Retrograde left femoral arteriogram Aortogram and bilateral iliac arteriogram using CO2 Mynx closure of left common femoral artery  SURGEON: Judeth Cornfield. Scot Dock, MD, FACS  ANESTHESIA: Local with sedation  EBL: Minimal  TECHNIQUE: The patient was brought to the peripheral vascular lab and was sedated. The period of conscious sedation was 44 minutes.  During that time period, I was present face-to-face 100% of the time.  The patient was administered 1 mg of Versed and 50 mcg of fentanyl. The patient's heart rate, blood pressure, and oxygen saturation were monitored by the nurse continuously during the procedure.  Both groins were prepped and draped in the usual sterile fashion.  Under ultrasound guidance, after the skin was anesthetized, I cannulated the left common femoral artery with a micropuncture needle and a micropuncture sheath was introduced over a wire.  There was bulky calcific plaque in the common femoral artery and I had to stick above this area.  To be sure that this was not a high stick I did shoot a retrograde left femoral arteriogram through the micropuncture sheath using half-strength contrast with a total of 5 cc of contrast.  This demonstrated that the stick was not high.  There was no significant inflow disease on the left.  This was exchanged for a 5 [place] sheath over a Bentson wire.  By ultrasound the femoral artery was patent. A real-time  image was obtained and sent to the server.  A pigtail catheter was positioned at the L1 vertebral body and flush aortogram obtained using CO2.  I then position the catheter above the bifurcation and an oblique iliac projection was obtained.  I also visualized the common femoral arteries bilaterally.  The patient has known superficial femoral artery occlusions with severe tibial artery occlusive disease and CO2 would not provide adequate visualization distally.  I elected not to give contrast at this point.  The left groin was then closed with a Mynx device with good hemostasis.  FINDINGS:   Single renal arteries bilaterally.  Moderate left renal artery stenosis. On the left side there is diffuse calcific disease but the common iliac, external iliac and hypogastric arteries are patent.  There is bulky plaque in the common femoral artery and the superficial femoral artery is occluded.  The deep femoral artery is patent. On the right side, there is a calcific plaque in the proximal common iliac artery.  However using CO2 this is poorly defined.  The external iliac artery has a stent which is patent and the hypogastric artery is patent.  There is an occlusion of the common femoral artery with reconstitution of the deep femoral artery.  The superficial femoral artery is occluded.  CLINICAL NOTE: I think the patient's best option for revascularization on the right would be to address his inflow disease.  This would involve a right common femoral artery endarterectomy and possible profundoplasty.  We  could shoot an intraoperative arteriogram with limited contrast to evaluate the proximal right common iliac artery stenosis.  If this is significant this could be addressed intraoperatively with a covered stent.  I would not recommend infrainguinal bypass.  In 2019 his only patent vessel distally was a small posterior tibial artery.  I think by addressing the inflow we can relieve his pain in the foot hopefully.   Given his PTCA in 2019 he will need preoperative cardiac clearance.  In addition given that we will have to give limited contrast in the operating room we will try to arrange evaluation by the nephrologist preoperatively.  Once this has been completed we can schedule him for surgery.    [name], MD, FACS [facility]  DATE OF DICTATION:   04/25/2022

## 2022-04-25 NOTE — Interval H&P Note (Signed)
History and Physical Interval Note:  04/25/2022 12:56 PM  William [name].  has presented today for surgery, with the diagnosis of right lower extremity rest pain.  The various methods of treatment have been discussed with the patient and family. After consideration of risks, benefits and other options for treatment, the patient has consented to  Procedure(s): ABDOMINAL AORTOGRAM W/LOWER EXTREMITY (N/A) as a surgical intervention.  The patient's history has been reviewed, patient examined, no change in status, stable for surgery.  I have reviewed the patient's chart and labs.  Questions were answered to the patient's satisfaction.     [name]

## 2022-04-28 ENCOUNTER — Encounter (HOSPITAL_COMMUNITY): Payer: Self-pay | Admitting: Vascular Surgery

## 2022-05-07 ENCOUNTER — Telehealth: Payer: Self-pay | Admitting: *Deleted

## 2022-05-07 ENCOUNTER — Encounter: Payer: Self-pay | Admitting: *Deleted

## 2022-05-07 NOTE — Patient Outreach (Signed)
  Care Coordination   05/07/2022 Name: William [name]. MRN: [id number] DOB: [birth year]   Care Coordination Outreach Attempts:  An unsuccessful telephone outreach was attempted today to offer the patient information about available care coordination services as a benefit of their health plan.   Follow Up Plan:  Additional outreach attempts will be made to offer the patient care coordination information and services.   Encounter Outcome:  No Answer   Care Coordination Interventions:  No, not indicated    SIG Josilyn Shippee C. Myrtie Neither, MSN, [facility] Gerontological Nurse Practitioner [facility] Care Management [phone]

## 2022-05-08 ENCOUNTER — Encounter: Payer: Self-pay | Admitting: *Deleted

## 2022-05-08 DIAGNOSIS — I2510 Atherosclerotic heart disease of native coronary artery without angina pectoris: Secondary | ICD-10-CM | POA: Diagnosis not present

## 2022-05-08 DIAGNOSIS — D631 Anemia in chronic kidney disease: Secondary | ICD-10-CM | POA: Diagnosis not present

## 2022-05-08 DIAGNOSIS — I129 Hypertensive chronic kidney disease with stage 1 through stage 4 chronic kidney disease, or unspecified chronic kidney disease: Secondary | ICD-10-CM | POA: Diagnosis not present

## 2022-05-08 DIAGNOSIS — N390 Urinary tract infection, site not specified: Secondary | ICD-10-CM | POA: Diagnosis not present

## 2022-05-08 DIAGNOSIS — I739 Peripheral vascular disease, unspecified: Secondary | ICD-10-CM | POA: Diagnosis not present

## 2022-05-08 DIAGNOSIS — N1832 Chronic kidney disease, stage 3b: Secondary | ICD-10-CM | POA: Diagnosis not present

## 2022-05-08 DIAGNOSIS — N189 Chronic kidney disease, unspecified: Secondary | ICD-10-CM | POA: Diagnosis not present

## 2022-05-12 ENCOUNTER — Other Ambulatory Visit: Payer: Self-pay | Admitting: Nephrology

## 2022-05-12 DIAGNOSIS — N1832 Chronic kidney disease, stage 3b: Secondary | ICD-10-CM

## 2022-05-14 ENCOUNTER — Telehealth: Payer: Self-pay | Admitting: Family Medicine

## 2022-05-14 ENCOUNTER — Ambulatory Visit
Admission: RE | Admit: 2022-05-14 | Discharge: 2022-05-14 | Disposition: A | Discharge status: Home / Self Care | Payer: Medicare Other | Source: Ambulatory Visit | Attending: Nephrology | Admitting: Nephrology

## 2022-05-14 DIAGNOSIS — N186 End stage renal disease: Secondary | ICD-10-CM | POA: Diagnosis not present

## 2022-05-14 DIAGNOSIS — N1832 Chronic kidney disease, stage 3b: Secondary | ICD-10-CM

## 2022-05-14 NOTE — Telephone Encounter (Signed)
Can you please call to check that he was able to get with DME and get a new mask? I reviewed his most recent download and his leak remains high and his apneic events are not well managed. If he did get the mask refitting, we may need to set him up for a titration study. Remind him to monitor for air leak at home and ensure mask is well fitting.

## 2022-05-14 NOTE — Telephone Encounter (Signed)
Called pt at [phone]. He confirmed he got new supplies/new mask about a month ago but [facility] did not do mask refit. I relayed AL,NP message. He will call [employer] at [phone] to set up mask refit. Aware we sent order back in January to them. He will call back if he has any trouble setting this up.

## 2022-05-15 ENCOUNTER — Ambulatory Visit (INDEPENDENT_AMBULATORY_CARE_PROVIDER_SITE_OTHER): Payer: Medicare Other | Admitting: Physician Assistant

## 2022-05-15 VITALS — BP 146/55 | HR 64 | Temp 98.0°F | Resp 20 | Ht 66.0 in | Wt 160.0 lb

## 2022-05-15 DIAGNOSIS — I70221 Atherosclerosis of native arteries of extremities with rest pain, right leg: Secondary | ICD-10-CM | POA: Diagnosis not present

## 2022-05-16 DIAGNOSIS — Z23 Encounter for immunization: Secondary | ICD-10-CM | POA: Diagnosis not present

## 2022-05-17 NOTE — Progress Notes (Signed)
Established PAD   History of Present Illness   William [name]. is a 82 y.o. (1940/12/14) male who presents as follow-up for PAD.  He has a history of rest pain in the right foot and recently underwent right lower extremity angiography by Dr. Scot Dock on 04/25/2022.  Angiography demonstrated multilevel arterial occlusive disease.  Per the op note, the patient would most benefit from future right common femoral endarterectomy with possible profundoplasty and possible intraoperative arteriogram and right common iliac covered stent.  At follow-up today, the patient was hopeful to discuss future surgical plans.  He was to get renal and cardiac clearance prior to surgery scheduling.  He recently underwent renal ultrasound, however he is unsure if he was approved by his nephrologist for surgery.  He is scheduled for a appointment with his cardiologist on March 20.  He denies any tissue loss in the right foot.  He endorses continued rest pain in the right foot.  Current Outpatient Medications  Medication Sig Dispense Refill   amLODipine (NORVASC) 5 MG tablet Take 2 tablets (10 mg total) by mouth daily. (Patient taking differently: Take 5 mg by mouth 2 (two) times daily.) 90 tablet 3   Ascorbic Acid (VITAMIN C) 1000 MG tablet Take 1,000 mg by mouth in the morning.     aspirin EC 81 MG tablet Take 1 tablet (81 mg total) by mouth daily.     budesonide-formoterol (SYMBICORT) 80-4.5 MCG/ACT inhaler Take 2 puffs first thing in am and then another 2 puffs about 12 hours later. 1 each 11   cephALEXin (KEFLEX) 500 MG capsule Take 1 capsule (500 mg total) by mouth 2 (two) times daily. 28 capsule 0   cholecalciferol (VITAMIN D) 25 MCG (1000 UNIT) tablet Take 1,000 Units by mouth in the morning.     ezetimibe (ZETIA) 10 MG tablet TAKE 1 TABLET BY MOUTH EVERY DAY 90 tablet 3   famotidine (PEPCID) 20 MG tablet Take 20 mg by mouth every evening.     fish oil-omega-3 fatty acids 1000 MG capsule Take 1,000 mg by  mouth in the morning.     folic acid (FOLVITE) A999333 MCG tablet Take 400 mcg by mouth in the morning.     Garlic [id number] MG TBEC Take 2,000 mg by mouth in the morning.     isosorbide mononitrate (IMDUR) 60 MG 24 hr tablet TAKE 1 TABLET BY MOUTH EVERY DAY 90 tablet 1   metoprolol tartrate (LOPRESSOR) 50 MG tablet TAKE 1 TABLET BY MOUTH TWICE A DAY 180 tablet 1   Multiple Vitamin (MULTIVITAMIN WITH MINERALS) TABS tablet Take 1 tablet by mouth in the morning.     nitroGLYCERIN (NITROSTAT) 0.4 MG SL tablet Place 1 tablet (0.4 mg total) under the tongue every 5 (five) minutes as needed for chest pain. If pain does not resolve with rest. 50 tablet 3   NP THYROID 30 MG tablet TAKE 1 TABLET (30 MG TOTAL) BY MOUTH DAILY BEFORE BREAKFAST. 90 tablet 1   omeprazole (PRILOSEC OTC) 20 MG tablet Take 1 tablet (20 mg total) by mouth daily. Take 30-60 min before first meal of the day     rosuvastatin (CRESTOR) 10 MG tablet TAKE 1 TABLET BY MOUTH EVERY DAY N 90 tablet 3   thiamine (VITAMIN B-1) 100 MG tablet Take 100 mg by mouth every evening.      ticagrelor (BRILINTA) 90 MG TABS tablet TAKE 1 TABLET BY MOUTH TWICE A DAY 60 tablet 3   traMADol (ULTRAM)  50 MG tablet Take 1 tablet (50 mg total) by mouth every 6 (six) hours as needed. 16 tablet 0   valsartan (DIOVAN) 160 MG tablet TAKE 1 TABLET BY MOUTH EVERY DAY 30 tablet 3   No current facility-administered medications for this visit.    REVIEW OF SYSTEMS (negative unless checked):   Cardiac:  []$  Chest pain or chest pressure? []$  Shortness of breath upon activity? []$  Shortness of breath when lying flat? []$  Irregular heart rhythm?  Vascular:  [x]$  Pain in calf, thigh, or hip brought on by walking? [x]$  Pain in feet at night that wakes you up from your sleep? []$  Blood clot in your veins? []$  Leg swelling?  Pulmonary:  []$  Oxygen at home? []$  Productive cough? []$  Wheezing?  Neurologic:  []$  Sudden weakness in arms or legs? []$  Sudden numbness in arms or  legs? []$  Sudden onset of difficult speaking or slurred speech? []$  Temporary loss of vision in one eye? []$  Problems with dizziness?  Gastrointestinal:  []$  Blood in stool? []$  Vomited blood?  Genitourinary:  []$  Burning when urinating? []$  Blood in urine?  Psychiatric:  []$  Major depression  Hematologic:  []$  Bleeding problems? []$  Problems with blood clotting?  Dermatologic:  []$  Rashes or ulcers?  Constitutional:  []$  Fever or chills?  Ear/Nose/Throat:  []$  Change in hearing? []$  Nose bleeds? []$  Sore throat?  Musculoskeletal:  []$  Back pain? []$  Joint pain? []$  Muscle pain?   Physical Examination   Vitals:   05/15/22 0919  BP: (!) 146/55  Pulse: 64  Resp: 20  Temp: 98 F (36.7 C)  TempSrc: Temporal  SpO2: 94%  Weight: 160 lb (72.6 kg)  Height: 5' 6"$  (1.676 m)   Body mass index is 25.82 kg/m.  General:  WDWN in NAD; vital signs documented above Gait: Not observed HENT: WNL, normocephalic Pulmonary: normal non-labored breathing  Cardiac: regular rate and rhythm Abdomen: soft, NT, no masses Skin: without rashes Vascular Exam/Pulses: nonpalpable pedal pulses. Monophasic signals in the right foot Extremities: Without wounds, tissue ischemia, gangrene Musculoskeletal: no muscle wasting or atrophy  Neurologic: A&O X 3;  No focal weakness or paresthesias are detected Psychiatric:  The pt has Normal affect.   Medical Decision Making   William [name]. is a 82 y.o. male who presents for discussion of future RLE revascularization  The patient is status post right lower extremity angiogram by Dr. Scot Dock on 04/25/2022.  It was found that the patient would likely benefit from right common femoral endarterectomy with possible profundoplasty and right common iliac artery stent for his right foot rest pain.  The patient was told that he would need clearance from his nephrologist and cardiologist prior to surgery. He has recently gotten a renal ultrasound and pending  nephrology clearance.  He is meeting with his cardiologist on March 20 for cardiac clearance. He will follow-up with Dr. Scot Dock in late March after his cardiologist appointment to discuss future surgery   Vicente Serene PA-C [facility] Office: [facility] MD: Scot Dock

## 2022-05-30 ENCOUNTER — Other Ambulatory Visit: Payer: Self-pay | Admitting: Nurse Practitioner

## 2022-05-30 DIAGNOSIS — D472 Monoclonal gammopathy: Secondary | ICD-10-CM

## 2022-05-30 NOTE — Progress Notes (Signed)
Please call patient and notify him that I have referred him to oncology at [facility] due to receiving notice from his nephrologist that he had a M-spike in his urine. This can represent a precursor of certain types of cancers so it warrants further evaluation/routine monitoring. He should hear from their office within the next week or so.

## 2022-05-31 ENCOUNTER — Other Ambulatory Visit: Payer: Self-pay | Admitting: Internal Medicine

## 2022-06-02 DIAGNOSIS — D472 Monoclonal gammopathy: Secondary | ICD-10-CM | POA: Insufficient documentation

## 2022-06-02 NOTE — Progress Notes (Signed)
Called pt and made aware of referral being send in and to expect a call by end of this week or mid next week

## 2022-06-02 NOTE — Progress Notes (Signed)
Left voice mail for pt to call back in regards to referral

## 2022-06-02 NOTE — Progress Notes (Signed)
[facility] [address] [place] [address]   Clinic Day:  06/02/2022  Referring physician: Ailene Ards, NP  Patient Care Team: Ailene Ards, NP as PCP - General (Nurse Practitioner) Satira Sark, MD as PCP - Cardiology (Cardiology) Tanda Rockers, MD as Consulting Physician (Pulmonary Disease) Freddie Breech, [facility] as Consulting Physician (Optometry)   ASSESSMENT & PLAN:   Assessment: ***  Plan: ***  No orders of the defined types were placed in this encounter.     I,William [name],acting as a Education administrator for [employer], MD.,have documented all relevant documentation on the behalf of William Jack, MD,as directed by  William Jack, MD while in the presence of William Jack, MD.   ***  William [name]   3/4/202410:05 PM  CHIEF COMPLAINT/PURPOSE OF CONSULT:   Diagnosis: ***  Current Therapy:  ***  HISTORY OF PRESENT ILLNESS:   William [name] is a 82 y.o. 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 Referral note labs noted below.  Today, he states that he is doing well overall. His appetite level is at ***%. His energy level is at ***%.  ***He was found to have abnormal CBC from *** ***He denies recent chest pain on exertion, shortness of breath on minimal exertion, pre-syncopal episodes, or palpitations. ***He had not noticed any recent bleeding such as epistaxis, hematuria or hematochezia ***The patient denies over the counter NSAID ingestion. He is not *** on antiplatelets agents. His last colonoscopy was *** ***He had no prior history or diagnosis of cancer. He denies any family history of cancer.  *** His age appropriate screening programs are up-to-date. ***He denies any pica and eats a variety of diet. ***He never donated blood or received blood transfusion. 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[facility])    Myocardial infarction ([place])    Peripheral arterial disease ([place])    Suboptimal revascularization options    Surgical History: Past Surgical History:  Procedure Laterality Date   ABDOMINAL AORTOGRAM W/LOWER EXTREMITY N/A 08/21/2017   Procedure: ABDOMINAL AORTOGRAM W/LOWER EXTREMITY;  Surgeon: Angelia Mould, MD;  Location: [facility];  Service: Cardiovascular;  Laterality: N/A;   ABDOMINAL AORTOGRAM W/LOWER EXTREMITY N/A 04/25/2022   Procedure: ABDOMINAL AORTOGRAM W/LOWER EXTREMITY;  Surgeon: Angelia Mould, MD;  Location: [facility];  Service: Cardiovascular;  Laterality: N/A;   Bilateral renal  artery stenoses  04/23/2009   CARDIAC CATHETERIZATION N/A 05/21/2015   Procedure: Left Heart Cath and Coronary Angiography;  Surgeon: Lorretta Harp, MD;  Location: [facility];  Service: Cardiovascular;  Laterality: N/A;   CORONARY ARTERY BYPASS GRAFT N/A 05/22/2015   Procedure: CORONARY ARTERY BYPASS GRAFTING (CABG) LIMA-LAD and SVG-OM EVH RIGHT THIGH GREATER SAPHENOUS VEIN;  Surgeon: [name], MD;  Location: [facility];  Service: Open Heart Surgery;  Laterality: N/A;   CORONARY ATHERECTOMY N/A 12/21/2020   Procedure: CORONARY ATHERECTOMY;  Surgeon: Burnell Blanks, MD;  Location: [facility];  Service: Cardiovascular;  Laterality: N/A;   CORONARY BALLOON ANGIOPLASTY N/A 12/06/2020   Procedure: CORONARY BALLOON ANGIOPLASTY;  Surgeon: Burnell Blanks, MD;  Location: [facility];  Service: Cardiovascular;  Laterality: N/A;   CORONARY STENT INTERVENTION N/A 12/21/2020    Procedure: CORONARY STENT INTERVENTION;  Surgeon: Burnell Blanks, MD;  Location: [facility];  Service: Cardiovascular;  Laterality: N/A;   HIATAL HERNIA REPAIR     INTRAVASCULAR ULTRASOUND/IVUS N/A 12/21/2020   Procedure: Intravascular Ultrasound/IVUS;  Surgeon: Burnell Blanks, MD;  Location: [facility];  Service: Cardiovascular;  Laterality: N/A;   LEFT HEART CATH AND CORS/GRAFTS ANGIOGRAPHY N/A 12/06/2020   Procedure: LEFT HEART CATH AND CORS/GRAFTS ANGIOGRAPHY;  Surgeon: Burnell Blanks, MD;  Location: [facility];  Service: Cardiovascular;  Laterality: N/A;   Percutaneous translumnial angioplasty  04/23/2009   Catheterization of Lefst external iliac artery with Left lower extremity runoff   TEE WITHOUT CARDIOVERSION N/A 05/22/2015   Procedure: TRANSESOPHAGEAL ECHOCARDIOGRAM (TEE);  Surgeon: [name], MD;  Location: [facility];  Service: Open Heart Surgery;  Laterality: N/A;    Social History: Social History   Socioeconomic History   Marital status: Married    Spouse name: Not on file   Number of children: Not on file   Years of education: Not on file   Highest education level: Not on file  Occupational History   Not on file  Tobacco Use   Smoking status: Former    Packs/day: 2.00    Years: 30.00    Total pack years: 60.00    Types: Cigarettes    Start date: 06/03/1961    Quit date: 11/17/1991    Years since quitting: 30.5    Passive exposure: Never   Smokeless tobacco: Former    Types: Snuff, Chew  Vaping Use   Vaping Use: Never used  Substance and Sexual Activity   Alcohol use: Not Currently    Alcohol/week: 2.0 - 4.0 standard drinks of alcohol    Types: 2 - 4 Standard drinks or equivalent per week    Comment: used to, quit 2012   Drug use: No   Sexual activity: Not Currently  Other Topics Concern   Not on file  Social History Narrative   Retired used to work in [employer]. Widower. Lives alone.   Social  Determinants of Health   Financial Resource Strain: Low Risk  (12/30/2021)   Overall Financial Resource Strain (CARDIA)    Difficulty of Paying Living Expenses: Not hard at all  Food Insecurity: No Food Insecurity (01/27/2022)   Hunger Vital Sign    Worried About Running Out of Food in the Last Year: Never true    Ran Out of Food in the Last Year: Never true  Transportation Needs: No Transportation Needs (01/27/2022)   PRAPARE - Hydrologist (Medical): No    Lack of Transportation (Non-Medical): No  Physical Activity: Insufficiently Active (01/27/2022)   Exercise Vital Sign    Days of Exercise per Week: 3 days    Minutes of Exercise per Session: 10 min  Stress: No Stress Concern Present (12/30/2021)   [place]    Feeling of Stress : Not at all  Social Connections: Moderately Integrated (12/30/2021)   Social Connection and Isolation Panel [NHANES]    Frequency of Communication with Friends and Family: More than three times a week  Frequency of Social Gatherings with Friends and Family: More than three times a week    Attends Religious Services: More than 4 times per year    Active Member of Clubs or Organizations: Yes    Attends Archivist Meetings: More than 4 times per year    Marital Status: Widowed  Intimate Partner Violence: Not At Risk (01/27/2022)   Humiliation, Afraid, Rape, and Kick questionnaire    Fear of Current or Ex-Partner: No    Emotionally Abused: No    Physically Abused: No    Sexually Abused: No    Family History: Family History  Problem Relation Age of Onset   Heart disease Father        Heart Disease before age 26   Pulmonary embolism Father    Deep vein thrombosis Father    Cancer Mother        Sarcoma    Current Medications:  Current Outpatient Medications:    amLODipine (NORVASC) 5 MG tablet, Take 2 tablets (10 mg total) by mouth daily.  (Patient taking differently: Take 5 mg by mouth 2 (two) times daily.), Disp: 90 tablet, Rfl: 3   Ascorbic Acid (VITAMIN C) 1000 MG tablet, Take 1,000 mg by mouth in the morning., Disp: , Rfl:    aspirin EC 81 MG tablet, Take 1 tablet (81 mg total) by mouth daily., Disp: , Rfl:    budesonide-formoterol (SYMBICORT) 80-4.5 MCG/ACT inhaler, Take 2 puffs first thing in am and then another 2 puffs about 12 hours later., Disp: 1 each, Rfl: 11   cephALEXin (KEFLEX) 500 MG capsule, Take 1 capsule (500 mg total) by mouth 2 (two) times daily., Disp: 28 capsule, Rfl: 0   cholecalciferol (VITAMIN D) 25 MCG (1000 UNIT) tablet, Take 1,000 Units by mouth in the morning., Disp: , Rfl:    ezetimibe (ZETIA) 10 MG tablet, TAKE 1 TABLET BY MOUTH EVERY DAY, Disp: 90 tablet, Rfl: 3   famotidine (PEPCID) 20 MG tablet, Take 20 mg by mouth every evening., Disp: , Rfl:    fish oil-omega-3 fatty acids 1000 MG capsule, Take 1,000 mg by mouth in the morning., Disp: , Rfl:    folic acid (FOLVITE) A999333 MCG tablet, Take 400 mcg by mouth in the morning., Disp: , Rfl:    Garlic [id number] MG TBEC, Take 2,000 mg by mouth in the morning., Disp: , Rfl:    isosorbide mononitrate (IMDUR) 60 MG 24 hr tablet, TAKE 1 TABLET BY MOUTH EVERY DAY, Disp: 90 tablet, Rfl: 1   metoprolol tartrate (LOPRESSOR) 50 MG tablet, TAKE 1 TABLET BY MOUTH TWICE A DAY, Disp: 180 tablet, Rfl: 1   Multiple Vitamin (MULTIVITAMIN WITH MINERALS) TABS tablet, Take 1 tablet by mouth in the morning., Disp: , Rfl:    nitroGLYCERIN (NITROSTAT) 0.4 MG SL tablet, Place 1 tablet (0.4 mg total) under the tongue every 5 (five) minutes as needed for chest pain. If pain does not resolve with rest., Disp: 50 tablet, Rfl: 3   NP THYROID 30 MG tablet, TAKE 1 TABLET (30 MG TOTAL) BY MOUTH DAILY BEFORE BREAKFAST., Disp: 90 tablet, Rfl: 1   omeprazole (PRILOSEC OTC) 20 MG tablet, Take 1 tablet (20 mg total) by mouth daily. Take 30-60 min before first meal of the day, Disp: , Rfl:     rosuvastatin (CRESTOR) 10 MG tablet, TAKE 1 TABLET BY MOUTH EVERY DAY N, Disp: 90 tablet, Rfl: 3   thiamine (VITAMIN B-1) 100 MG tablet, Take 100 mg by mouth every evening. ,  Disp: , Rfl:    ticagrelor (BRILINTA) 90 MG TABS tablet, TAKE 1 TABLET BY MOUTH TWICE A DAY, Disp: 60 tablet, Rfl: 3   traMADol (ULTRAM) 50 MG tablet, Take 1 tablet (50 mg total) by mouth every 6 (six) hours as needed., Disp: 16 tablet, Rfl: 0   valsartan (DIOVAN) 160 MG tablet, TAKE 1 TABLET BY MOUTH EVERY DAY, Disp: 90 tablet, Rfl: 1   Allergies: No Known Allergies  REVIEW OF SYSTEMS:   Review of Systems  Constitutional:  Negative for chills, fatigue and fever.  HENT:   Negative for lump/mass, mouth sores, nosebleeds, sore throat and trouble swallowing.   Eyes:  Negative for eye problems.  Respiratory:  Negative for cough and shortness of breath.   Cardiovascular:  Negative for chest pain, leg swelling and palpitations.  Gastrointestinal:  Negative for abdominal pain, constipation, diarrhea, nausea and vomiting.  Genitourinary:  Negative for bladder incontinence, difficulty urinating, dysuria, frequency, hematuria and nocturia.   Musculoskeletal:  Negative for arthralgias, back pain, flank pain, myalgias and neck pain.  Skin:  Negative for itching and rash.  Neurological:  Negative for dizziness, headaches and numbness.  Hematological:  Does not bruise/bleed easily.  Psychiatric/Behavioral:  Negative for depression, sleep disturbance and suicidal ideas. The patient is not nervous/anxious.   All other systems reviewed and are negative.    VITALS:   There were no vitals taken for this visit.  Wt Readings from Last 3 Encounters:  05/15/22 72.6 kg (160 lb)  04/25/22 72.6 kg (160 lb)  04/17/22 71.9 kg (158 lb 9.6 oz)    There is no height or weight on file to calculate BMI.   PHYSICAL EXAM:   Physical Exam Vitals and nursing note reviewed. Exam conducted with a chaperone present.  Constitutional:       Appearance: Normal appearance.  Cardiovascular:     Rate and Rhythm: Normal rate and regular rhythm.     Pulses: Normal pulses.     Heart sounds: Normal heart sounds.  Pulmonary:     Effort: Pulmonary effort is normal.     Breath sounds: Normal breath sounds.  Abdominal:     Palpations: Abdomen is soft. There is no hepatomegaly, splenomegaly or mass.     Tenderness: There is no abdominal tenderness.  Musculoskeletal:     Right lower leg: No edema.     Left lower leg: No edema.  Lymphadenopathy:     Cervical: No cervical adenopathy.     Right cervical: No superficial, deep or posterior cervical adenopathy.    Left cervical: No superficial, deep or posterior cervical adenopathy.     Upper Body:     Right upper body: No supraclavicular or axillary adenopathy.     Left upper body: No supraclavicular or axillary adenopathy.  Neurological:     General: No focal deficit present.     Mental Status: He is alert and oriented to person, place, and time.  Psychiatric:        Mood and Affect: Mood normal.        Behavior: Behavior normal.     LABS:        Latest Ref Rng & Units 04/25/2022    8:49 AM 11/14/2021    1:53 PM 11/01/2021   10:04 AM  CBC  WBC 3.4 - 10.8 x10E3/uL  8.9  8.3   Hemoglobin 13.0 - 17.0 g/dL 12.9  11.1  11.6   Hematocrit 39.0 - 52.0 % 38.0  32.9  35.1   Platelets  150 - 450 x10E3/uL  320  311       Latest Ref Rng & Units 04/25/2022    8:49 AM 02/27/2022   11:05 AM 11/14/2021    1:53 PM  CMP  Glucose 70 - 99 mg/dL 96  105  87   BUN 8 - 23 mg/dL 41  31  25   Creatinine 0.61 - 1.24 mg/dL 2.20  1.60  1.42   Sodium 135 - 145 mmol/L 138  140  140   Potassium 3.5 - 5.1 mmol/L 5.1  4.9  4.5   Chloride 98 - 111 mmol/L 106  108  106   CO2 19 - 32 mEq/L  25  16   Calcium 8.4 - 10.5 mg/dL  9.8  8.9   Total Protein 6.0 - 8.3 g/dL  7.3    Total Bilirubin 0.2 - 1.2 mg/dL  0.5    Alkaline Phos 39 - 117 U/L  50    AST 0 - 37 U/L  23    ALT 0 - 53 U/L  18       No  results found for: "CEA1", "CEA" / No results found for: "CEA1", "CEA" No results found for: "PSA1" No results found for: "[id number]" No results found for: "CAN125"  No results found for: "TOTALPROTELP", "ALBUMINELP", "A1GS", "A2GS", "BETS", "BETA2SER", "GAMS", "MSPIKE", "SPEI" No results found for: "TIBC", "FERRITIN", "IRONPCTSAT" No results found for: "LDH"   STUDIES:   US RENAL  Result Date: 05/15/2022 CLINICAL DATA:  Stage IIIB chronic kidney disease. EXAM: RENAL / URINARY TRACT ULTRASOUND COMPLETE COMPARISON:  July 11, 2008 FINDINGS: Right Kidney: Renal measurements: 9.1 x 4.7 x 5 cm = volume: 112.6 mL. Diffuse increased echotexture of the kidney. No mass or hydronephrosis visualized. Left Kidney: Renal measurements: 11.5 x 6.2 x 5.4 cm = volume: 201 mL. Diffuse increased echotexture of the kidney. No mass or hydronephrosis visualized. Bladder: Appears normal for degree of bladder distention. Other: None. IMPRESSION: Diffuse increased echotexture of the bilateral kidneys. This is nonspecific but can be seen in medical renal disease. Electronically Signed   By: Abelardo Diesel M.D.   On: 05/15/2022 08:28

## 2022-06-03 ENCOUNTER — Encounter: Payer: Self-pay | Admitting: Hematology

## 2022-06-03 ENCOUNTER — Inpatient Hospital Stay: Payer: Medicare Other

## 2022-06-03 ENCOUNTER — Inpatient Hospital Stay: Payer: Medicare Other | Attending: Hematology | Admitting: Hematology

## 2022-06-03 ENCOUNTER — Ambulatory Visit (HOSPITAL_COMMUNITY)
Admission: RE | Admit: 2022-06-03 | Discharge: 2022-06-03 | Disposition: A | Discharge status: Home / Self Care | Payer: Medicare Other | Source: Ambulatory Visit | Attending: Hematology | Admitting: Hematology

## 2022-06-03 VITALS — BP 125/57 | HR 66 | Temp 97.5°F | Resp 20 | Ht 64.17 in | Wt 161.8 lb

## 2022-06-03 DIAGNOSIS — Z7982 Long term (current) use of aspirin: Secondary | ICD-10-CM | POA: Diagnosis not present

## 2022-06-03 DIAGNOSIS — Z87891 Personal history of nicotine dependence: Secondary | ICD-10-CM | POA: Insufficient documentation

## 2022-06-03 DIAGNOSIS — Z7902 Long term (current) use of antithrombotics/antiplatelets: Secondary | ICD-10-CM | POA: Diagnosis not present

## 2022-06-03 DIAGNOSIS — D472 Monoclonal gammopathy: Secondary | ICD-10-CM | POA: Insufficient documentation

## 2022-06-03 DIAGNOSIS — E611 Iron deficiency: Secondary | ICD-10-CM

## 2022-06-03 DIAGNOSIS — Z79899 Other long term (current) drug therapy: Secondary | ICD-10-CM | POA: Insufficient documentation

## 2022-06-03 DIAGNOSIS — N1832 Chronic kidney disease, stage 3b: Secondary | ICD-10-CM | POA: Diagnosis not present

## 2022-06-03 DIAGNOSIS — I129 Hypertensive chronic kidney disease with stage 1 through stage 4 chronic kidney disease, or unspecified chronic kidney disease: Secondary | ICD-10-CM | POA: Insufficient documentation

## 2022-06-03 DIAGNOSIS — Z7951 Long term (current) use of inhaled steroids: Secondary | ICD-10-CM | POA: Insufficient documentation

## 2022-06-03 LAB — FERRITIN: Ferritin: 44 ng/mL (ref 24–336)

## 2022-06-03 LAB — IRON AND TIBC
Iron: 103 ug/dL (ref 45–182)
Saturation Ratios: 26 % (ref 17.9–39.5)
TIBC: 403 ug/dL (ref 250–450)
UIBC: 300 ug/dL

## 2022-06-03 LAB — LACTATE DEHYDROGENASE: LDH: 148 U/L (ref 98–192)

## 2022-06-03 NOTE — Patient Instructions (Addendum)
[facility] at [facility] Discharge Instructions   You were seen and examined today by Dr. Delton Coombes. He is a blood specialist who is seeing you today on behalf of your doctor for an abnormal protein in your blood.   We will do additional lab work today to investigate this further.   Return as scheduled to review these results.    Thank you for choosing [facility] at [facility] to provide your oncology and hematology care.  To afford each patient quality time with our provider, please arrive at least 15 minutes before your scheduled appointment time.   If you have a lab appointment with the [facility] please come in thru the Main Entrance and check in at the main information desk.  You need to re-schedule your appointment should you arrive 10 or more minutes late.  We strive to give you quality time with our providers, and arriving late affects you and other patients whose appointments are after yours.  Also, if you no show three or more times for appointments you may be dismissed from the clinic at the providers discretion.     Again, thank you for choosing [facility].  Our hope is that these requests will decrease the amount of time that you wait before being seen by our physicians.       _____________________________________________________________  Should you have questions after your visit to [facility], please contact our office at [phone] and follow the prompts.  Our office hours are 8:00 a.m. and 4:30 p.m. Monday - Friday.  Please note that voicemails left after 4:00 p.m. may not be returned until the following business day.  We are closed weekends and major holidays.  You do have access to a nurse 24-7, just call the main number to the clinic [phone] and do not press any options, hold on the line and a nurse will answer the phone.    For prescription refill requests, have your pharmacy contact our  office and allow 72 hours.    Due to Covid, you will need to wear a mask upon entering the hospital. If you do not have a mask, a mask will be given to you at the Main Entrance upon arrival. For doctor visits, patients may have 1 support person age 103 or older with them. For treatment visits, patients can not have anyone with them due to social distancing guidelines and our immunocompromised population.

## 2022-06-04 LAB — KAPPA/LAMBDA LIGHT CHAINS
Kappa free light chain: 47.4 mg/L — ABNORMAL HIGH (ref 3.3–19.4)
Kappa, lambda light chain ratio: 1.81 — ABNORMAL HIGH (ref 0.26–1.65)
Lambda free light chains: 26.2 mg/L (ref 5.7–26.3)

## 2022-06-04 LAB — BETA 2 MICROGLOBULIN, SERUM: Beta-2 Microglobulin: 2.9 mg/L — ABNORMAL HIGH (ref 0.6–2.4)

## 2022-06-05 LAB — PTH, INTACT AND CALCIUM
Calcium, Total (PTH): 9.7 mg/dL (ref 8.6–10.2)
PTH: 22 pg/mL (ref 15–65)

## 2022-06-10 LAB — IMMUNOFIXATION ELECTROPHORESIS
IgA: 250 mg/dL (ref 61–437)
IgG (Immunoglobin G), Serum: 782 mg/dL (ref 603–1613)
IgM (Immunoglobulin M), Srm: 200 mg/dL — ABNORMAL HIGH (ref 15–143)
Total Protein ELP: 6.5 g/dL (ref 6.0–8.5)

## 2022-06-18 ENCOUNTER — Ambulatory Visit: Payer: Medicare Other | Attending: Cardiology | Admitting: Cardiology

## 2022-06-18 ENCOUNTER — Encounter: Payer: Self-pay | Admitting: Cardiology

## 2022-06-18 VITALS — BP 138/70 | HR 64 | Ht 66.0 in | Wt 162.6 lb

## 2022-06-18 DIAGNOSIS — E782 Mixed hyperlipidemia: Secondary | ICD-10-CM | POA: Diagnosis not present

## 2022-06-18 DIAGNOSIS — I25119 Atherosclerotic heart disease of native coronary artery with unspecified angina pectoris: Secondary | ICD-10-CM | POA: Diagnosis not present

## 2022-06-18 DIAGNOSIS — Z01810 Encounter for preprocedural cardiovascular examination: Secondary | ICD-10-CM | POA: Insufficient documentation

## 2022-06-18 DIAGNOSIS — I739 Peripheral vascular disease, unspecified: Secondary | ICD-10-CM | POA: Diagnosis not present

## 2022-06-18 MED ORDER — TICAGRELOR 60 MG PO TABS: 60.0000 mg | ORAL_TABLET | Freq: Two times a day (BID) | ORAL | 6 refills | Status: DC

## 2022-06-18 NOTE — Patient Instructions (Addendum)
Medication Instructions:  Your physician has recommended you make the following change in your medication:  Decrease brilinta 60 mg twice daily Continue other medications the same  Labwork: none  Testing/Procedures: none  Follow-Up: Your physician recommends that you schedule a follow-up appointment in: 3 months  Any Other Special Instructions Will Be Listed Below (If Applicable).  If you need a refill on your cardiac medications before your next appointment, please call your pharmacy.

## 2022-06-18 NOTE — Progress Notes (Signed)
Cardiology Office Note  Date: 06/18/2022   ID: William [name]., DOB 04-Mar-1941, MRN [id number]  History of Present Illness: William [name]. is an 82 y.o. male last seen in September 2023.  He is here for a routine visit and also for preoperative cardiac evaluation.  He has PAD with claudication, following with [facility]. He underwent aortography with femoral and iliac angiograms in January with Dr. Scot Dock.  He is being considered for revascularization to include right common femoral artery endarterectomy and possible profundoplasty, presumably under general anesthesia, operation has not yet been scheduled.  He does have history of chronic kidney disease with relative improvement in creatinine based on most recent lab work.  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  Most recent intervention was DES x 3 to the proximal to distal RCA in September 2022.  He had otherwise limited revascularization options.  LVEF was vigorous at 65 to 70% by echocardiogram last year.  He reports stable angina, NYHA class II-III dyspnea which is also stable.  No obvious fluid retention, no palpitations or syncope.  RCRI perioperative cardiac index is class IV, 11% chance of major adverse cardiac event.  I reviewed his medications, we discussed reducing Brilinta to 60 mg twice daily, he is over a year out from last intervention.  Physical Exam: VS:  BP 138/70   Pulse 64   Ht 5\' 6"  (1.676 m)   Wt 162 lb 9.6 oz (73.8 kg)   SpO2 96%   BMI 26.24 kg/m , BMI Body mass index is 26.24 kg/m.  Wt Readings from Last 3 Encounters:  06/18/22 162 lb 9.6 oz (73.8 kg)  06/03/22 161 lb 13.1 oz (73.4 kg)  05/15/22 160 lb (72.6 kg)    General: Patient appears comfortable at rest. HEENT: Conjunctiva and lids normal. Neck: Supple, no elevated JVP or carotid bruits. Lungs:  Clear to auscultation, nonlabored breathing at rest. Cardiac: Regular rate and rhythm, no S3, 1/6 systolic murmur. Abdomen: Soft, bowel sounds present. Extremities: No pitting edema, decreased distal pulses.  ECG:  An ECG dated 12/05/2021 was personally reviewed today and demonstrated:  Sinus rhythm with nonspecific ST-T changes.  Labwork: 11/14/2021: Platelets 320 02/27/2022: ALT 18; AST 23; TSH 1.17 04/25/2022: BUN 41; Creatinine, Ser 2.20; Hemoglobin 12.9; Potassium 5.1; Sodium 138     Component Value Date/Time   CHOL 101 02/27/2022 1105   TRIG 84.0 02/27/2022 1105   HDL 41.40 02/27/2022 1105   CHOLHDL 2 02/27/2022 1105   VLDL 16.8 02/27/2022 1105   LDLCALC 43 02/27/2022 1105   LDLCALC 81 07/04/2020 1018   Other Studies Reviewed Today:  Echocardiogram 10/16/2021:  1. Left ventricular ejection fraction, by estimation, is 65 to 70%. The  left ventricle has normal function. The left ventricle has no regional  wall motion abnormalities. There is mild left ventricular hypertrophy.  Left ventricular diastolic parameters  are consistent with Grade I diastolic dysfunction (impaired relaxation).  The average left ventricular global longitudinal strain is -19.9 %. The  global longitudinal strain is normal.   2. Right ventricular systolic function is normal. The right ventricular  size is normal. Tricuspid regurgitation signal is inadequate for assessing  PA pressure.   3. Left atrial size was mildly dilated.   4. The mitral valve is grossly normal. Trivial mitral valve  regurgitation.   5. The aortic valve is tricuspid.  There is mild calcification of the  aortic valve. Aortic valve regurgitation is mild. Aortic valve  sclerosis/calcification is present, without any evidence of aortic  stenosis. Aortic regurgitation PHT measures 786 msec.   6. The inferior vena cava is normal in size with greater than 50%  respiratory variability, suggesting right atrial pressure of 3 mmHg.    Assessment and Plan:  1.  Preoperative cardiac evaluation prior to potential right common femoral artery endarterectomy and profundoplasty under general anesthesia with Dr. Scot Dock.  Operation has not yet been scheduled.  Patient has known multivessel disease status post CABG with graft disease and DES intervention to the RCA in September 2022 with otherwise limited revascularization options.  He has stable angina and NYHA class II-III dyspnea with LVEF 65 to 70% by echocardiogram in July of last year.  RCRI perioperative cardiac risk index is class IV, 11% chance of major adverse cardiac event, overall high risk.  This is not a significantly modifiable risk, and noninvasive ischemic testing would not be of much use at this point to clarify this risk further.  He has follow-up planned with Dr. Scot Dock later this month for further discussion  2.  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  He has subsequently documented graft disease and ultimately underwent placement of DES x 3 overlapping within the proximal to distal RCA in September 2022 with otherwise limited revascularization options.  LVEF 65 to 70% by echocardiogram in July 2023.  He has been managed medically.  Continue aspirin, reduce Brilinta to 60 mg twice daily.  No change in doses of Lopressor, Diovan, Imdur, Norvasc, Crestor, and Zetia.  2.  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  LDL 43 in November 2023.  3.  PAD, followed by Dr. Scot Dock.  4.  Essential hypertension.  Blood pressure control is reasonable today.  5.  CKD stage 3a-b.  Creatinine 1.3 in February.  6.  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  Disposition:  Follow up  3 months.  Signed, Satira Sark, M.D., F.A.C.C.

## 2022-06-19 ENCOUNTER — Ambulatory Visit: Payer: Self-pay | Admitting: *Deleted

## 2022-06-19 NOTE — Patient Outreach (Signed)
  Care Coordination   Follow Up Visit Note Case Closure Note   06/19/2022 Name: William [name]. MRN: [id number] DOB: [birth year]  William [name]. is a 82 y.o. year old male who sees Ailene Ards, NP for primary care. I spoke with  William [name]. by phone today.  What matters to the patients health and wellness today?  Getting consult for CAD.    Goals Addressed             This Visit's Progress    Blood Pressure < 140/90       BP in cardiologist office 06/18/19 - 138/70.     GET BACK TO EXERCISING.       Interventions Today    Flowsheet Row Most Recent Value  Chronic Disease   Chronic disease during today's visit Other  [CAD]  General Interventions   General Interventions Discussed/Reviewed General Interventions Discussed, General Interventions Reviewed  [Pt saw Dr. Domenic Polite yesterday and is being sent to CV surgeon for consult regarding his CAD and need for stenting or open chest surgery.]  Exercise Interventions   Exercise Discussed/Reviewed Exercise Discussed, Exercise Reviewed  [Dr. Domenic Polite has advised pt (per pt) that exercise should be limited to just what he does on a daily basis rather than formal exercise due to his heart condition.]              SDOH assessments and interventions completed:  Yes  PREVIOUSLY ASSESSED.  Care Coordination Interventions:  Yes, provided   Follow up plan: No further intervention required. PT HAS REQUESTED TO CLOSE HIS CASE UNTIL FURTHER NEEDS.PROVIDED [facility] CARE MANAGEMENT OFFICE PHONE NUMBER. ENCOURAGED PT TO CALL MD OR [facility] FOR ANY ISSUES TO AVOID COMPLICATIONS.  Encounter Outcome:  Pt. Visit Completed   Kayleen Memos C. Myrtie Neither, MSN, [facility] Gerontological Nurse Practitioner [facility] Care Management [phone]

## 2022-06-24 ENCOUNTER — Other Ambulatory Visit (HOSPITAL_COMMUNITY)
Admission: RE | Admit: 2022-06-24 | Discharge: 2022-06-24 | Disposition: A | Discharge status: Home / Self Care | Payer: Medicare Other | Source: Ambulatory Visit | Attending: Hematology | Admitting: Hematology

## 2022-06-24 ENCOUNTER — Inpatient Hospital Stay (HOSPITAL_BASED_OUTPATIENT_CLINIC_OR_DEPARTMENT_OTHER): Payer: Medicare Other | Admitting: Hematology

## 2022-06-24 VITALS — BP 155/46 | HR 76 | Temp 97.7°F | Resp 18 | Ht 66.0 in | Wt 163.8 lb

## 2022-06-24 DIAGNOSIS — E611 Iron deficiency: Secondary | ICD-10-CM | POA: Diagnosis not present

## 2022-06-24 DIAGNOSIS — D472 Monoclonal gammopathy: Secondary | ICD-10-CM | POA: Insufficient documentation

## 2022-06-24 DIAGNOSIS — Z87891 Personal history of nicotine dependence: Secondary | ICD-10-CM | POA: Diagnosis not present

## 2022-06-24 DIAGNOSIS — I129 Hypertensive chronic kidney disease with stage 1 through stage 4 chronic kidney disease, or unspecified chronic kidney disease: Secondary | ICD-10-CM | POA: Diagnosis not present

## 2022-06-24 DIAGNOSIS — N1832 Chronic kidney disease, stage 3b: Secondary | ICD-10-CM | POA: Diagnosis not present

## 2022-06-24 DIAGNOSIS — Z7902 Long term (current) use of antithrombotics/antiplatelets: Secondary | ICD-10-CM | POA: Diagnosis not present

## 2022-06-24 NOTE — Patient Instructions (Addendum)
[facility] Discharge Instructions   You were seen and examined today by Dr. Delton Coombes.  He reviewed the results of your lab work done previously. 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 This is a condition that we will monitor periodically with lab work.   He reviewed the results of your bone scan which was normal.   We will see you back in 4 months. We will repeat lab work 1 week prior to this visit.   Thank you for choosing [place] [facility] to provide your oncology and hematology care.  To afford each patient quality time with our provider, please arrive at least 15 minutes before your scheduled appointment time.   If you have a lab appointment with the [facility] please come in thru the Main Entrance and check in at the main information desk.  You need to re-schedule your appointment should you arrive 10 or more minutes late.  We strive to give you quality time with our providers, and arriving late affects you and other patients whose appointments are after yours.  Also, if you no show three or more times for appointments you may be dismissed from the clinic at the providers discretion.     Again, thank you for choosing [facility].  Our hope is that these requests will decrease the amount of time that you wait before being seen by our physicians.       _____________________________________________________________  Should you have questions after your visit to [facility], please contact our office at [phone] and follow the prompts.  Our office hours are 8:00 a.m. and 4:30 p.m. Monday - Friday.  Please note that voicemails left after 4:00 p.m. may not be returned until the following business day.  We are closed weekends and major holidays.  You do have access to a nurse 24-7, just call the main number to the clinic [phone] and do not press  any options, hold on the line and a nurse will answer the phone.    For prescription refill requests, have your pharmacy contact our office and allow 72 hours.    Due to Covid, you will need to wear a mask upon entering the hospital. If you do not have a mask, a mask will be given to you at the Main Entrance upon arrival. For doctor visits, patients may have 1 support person age 43 or older with them. For treatment visits, patients can not have anyone with them due to social distancing guidelines and our immunocompromised population.

## 2022-06-24 NOTE — Progress Notes (Signed)
[facility] [address] [place] [address]   Clinic Day:  06/24/2022  Referring physician: Ailene Ards, NP  Patient Care Team: Ailene Ards, NP as PCP - General (Nurse Practitioner) Satira Sark, MD as PCP - Cardiology (Cardiology) Tanda Rockers, MD as Consulting Physician (Pulmonary Disease) Freddie Breech, [facility] as Consulting Physician (Optometry)   ASSESSMENT & PLAN:   Assessment:  1.  Monoclonal gammopathy: - Labs done by Dr. Carolin Sicks on 05/08/2022, SPEP showed 0.3 g of M spike. 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 - Creatinine 1.3, calcium 10.4 mildly elevated. - Immunofixation: IgM kappa.  Kappa light chains 47.4, ratio 1.81.  Beta-2 microglobulin 2.9.  LDH 148.  2.  Social/family history: - Lives by himself and is independent of ADLs and IADLs.  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  Smoked 2 packs of cigarettes per day for 20 years and quit smoking 20 years ago. - Mother had metastatic sarcoma.  Sister had lung cancer.  Plan:  1.  IgM kappa monoclonal gammopathy: - We reviewed labs from 06/03/2022.  Beta-2 microglobulin 2.9. - Skeletal survey (06/03/2022): No evidence of lytic lesions.  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 - We discussed MGUS in detail.  Differential also includes Waldenstrm's macroglobulinemia.  If any significant changes, consider further workup with bone marrow biopsy and scans. - Recommend follow-up in 4 months with repeat myeloma labs.  Will also check ferritin and iron panel.  2.  Mild hypercalcemia: - He is currently taking vitamin D 1000 units daily. - Intact PTH was 22 with calcium normal at 9.7.  Closely monitor.   Orders Placed This Encounter  Procedures   Kappa/lambda light chains    Standing Status:   Future    Standing Expiration Date:   06/24/2023   Protein electrophoresis, serum    Standing  Status:   Future    Standing Expiration Date:   06/24/2023   Ferritin    Standing Status:   Future    Standing Expiration Date:   06/24/2023   Iron and TIBC ([facility] ONLY)    Standing Status:   Future    Standing Expiration Date:   06/24/2023   CBC with Differential    Standing Status:   Future    Standing Expiration Date:   06/24/2023   Comprehensive metabolic panel    Standing Status:   Future    Standing Expiration Date:   06/24/2023    I,Alexis Herring,acting as a scribe for Derek Jack, MD.,have documented all relevant documentation on the behalf of Derek Jack, MD,as directed by  Derek Jack, MD while in the presence of Derek Jack, MD.  I, Derek Jack MD, have reviewed the above documentation for accuracy and completeness, and I agree with the above.    Derek Jack, MD   3/26/20246:42 PM  CHIEF COMPLAINT/PURPOSE OF CONSULT:   Diagnosis: Monoclonal gammopathy  Current Therapy: Observation  INTERVAL HISTORY:   William [name] is a 82 y.o. male presenting to clinic today for follow up of Monoclonal (M) protein disease, multiple 'M' protein. He was last seen by me on 06/03/22 for consult.  Today, he states that he is doing well overall. His appetite level is at 100%. His energy level is at 40-50%.  He reports chronic BLE pain secondary to his PAD. His Brilinta dose was reduced to 60mg  BID by  his cardiologist Dr. Rozann Lesches on 06/18/22. He denies any bleeding issues but does have some easy bruisability. Per Dr. Myles Gip OV note from that visit, patient is being considered for revascularization to include right common femoral artery endarterectomy and possible profundoplasty. He is followed by Dr. [name] for this.  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[facility])    Myocardial infarction ([place])    Peripheral arterial disease ([place])    Suboptimal revascularization options    Surgical History: Past Surgical History:  Procedure Laterality Date   ABDOMINAL AORTOGRAM W/LOWER EXTREMITY N/A 08/21/2017   Procedure: ABDOMINAL AORTOGRAM W/LOWER EXTREMITY;  Surgeon: Angelia Mould, MD;  Location: [facility];  Service: Cardiovascular;  Laterality: N/A;   ABDOMINAL AORTOGRAM W/LOWER EXTREMITY N/A 04/25/2022   Procedure: ABDOMINAL AORTOGRAM W/LOWER EXTREMITY;  Surgeon: Angelia Mould, MD;  Location: [facility];  Service: Cardiovascular;  Laterality: N/A;   Bilateral renal  artery stenoses  04/23/2009   CARDIAC CATHETERIZATION N/A 05/21/2015   Procedure: Left Heart Cath and Coronary Angiography;  Surgeon: Lorretta Harp, MD;  Location: [facility];  Service: Cardiovascular;  Laterality: N/A;   CORONARY ARTERY BYPASS GRAFT N/A 05/22/2015   Procedure: CORONARY ARTERY BYPASS GRAFTING (CABG) LIMA-LAD and SVG-OM EVH RIGHT THIGH GREATER SAPHENOUS VEIN;  Surgeon: [name], MD;  Location: [facility];  Service: Open Heart Surgery;  Laterality: N/A;   CORONARY ATHERECTOMY N/A 12/21/2020   Procedure: CORONARY ATHERECTOMY;  Surgeon: Burnell Blanks, MD;  Location: [facility];  Service: Cardiovascular;  Laterality: N/A;   CORONARY BALLOON ANGIOPLASTY N/A 12/06/2020   Procedure: CORONARY BALLOON ANGIOPLASTY;  Surgeon: Burnell Blanks, MD;  Location: [facility];  Service: Cardiovascular;  Laterality: N/A;   CORONARY STENT INTERVENTION N/A 12/21/2020   Procedure: CORONARY STENT INTERVENTION;  Surgeon: Burnell Blanks, MD;  Location: [facility];  Service: Cardiovascular;  Laterality: N/A;   HIATAL HERNIA REPAIR     INTRAVASCULAR  ULTRASOUND/IVUS N/A 12/21/2020   Procedure: Intravascular Ultrasound/IVUS;  Surgeon: Burnell Blanks, MD;  Location: [facility];  Service: Cardiovascular;  Laterality: N/A;   LEFT HEART CATH AND CORS/GRAFTS ANGIOGRAPHY N/A 12/06/2020   Procedure: LEFT HEART CATH AND CORS/GRAFTS ANGIOGRAPHY;  Surgeon: Burnell Blanks, MD;  Location: [facility];  Service: Cardiovascular;  Laterality: N/A;   Percutaneous translumnial angioplasty  04/23/2009   Catheterization of Lefst external iliac artery with Left lower extremity runoff   TEE WITHOUT CARDIOVERSION N/A 05/22/2015   Procedure: TRANSESOPHAGEAL ECHOCARDIOGRAM (TEE);  Surgeon: [name], MD;  Location: [facility];  Service: Open Heart Surgery;  Laterality: N/A;    Social History: Social History   Socioeconomic History   Marital status: Married    Spouse name: Not on file   Number of children: Not on file   Years of education: Not on file   Highest education level: Not on file  Occupational History   Not on file  Tobacco Use   Smoking status: Former    Packs/day: 2.00    Years: 30.00    Additional pack  years: 0.00    Total pack years: 60.00    Types: Cigarettes    Start date: 06/03/1961    Quit date: 11/17/1991    Years since quitting: 30.6    Passive exposure: Never   Smokeless tobacco: Former    Types: Snuff, Chew  Vaping Use   Vaping Use: Never used  Substance and Sexual Activity   Alcohol use: Not Currently    Alcohol/week: 2.0 - 4.0 standard drinks of alcohol    Types: 2 - 4 Standard drinks or equivalent per week    Comment: used to, quit 2012   Drug use: No   Sexual activity: Not Currently  Other Topics Concern   Not on file  Social History Narrative   Retired used to work in [employer]. Widower. Lives alone.   Social Determinants of Health   Financial Resource Strain: Low Risk  (12/30/2021)   Overall Financial Resource Strain (CARDIA)    Difficulty of Paying Living Expenses: Not hard at  all  Food Insecurity: No Food Insecurity (06/03/2022)   Hunger Vital Sign    Worried About Running Out of Food in the Last Year: Never true    Ran Out of Food in the Last Year: Never true  Transportation Needs: No Transportation Needs (06/03/2022)   PRAPARE - Hydrologist (Medical): No    Lack of Transportation (Non-Medical): No  Physical Activity: Insufficiently Active (01/27/2022)   Exercise Vital Sign    Days of Exercise per Week: 3 days    Minutes of Exercise per Session: 10 min  Stress: No Stress Concern Present (12/30/2021)   [place]    Feeling of Stress : Not at all  Social Connections: Moderately Integrated (12/30/2021)   Social Connection and Isolation Panel [NHANES]    Frequency of Communication with Friends and Family: More than three times a week    Frequency of Social Gatherings with Friends and Family: More than three times a week    Attends Religious Services: More than 4 times per year    Active Member of [employer] or Organizations: Yes    Attends Archivist Meetings: More than 4 times per year    Marital Status: Widowed  Intimate Partner Violence: Not At Risk (06/03/2022)   Humiliation, Afraid, Rape, and Kick questionnaire    Fear of Current or Ex-Partner: No    Emotionally Abused: No    Physically Abused: No    Sexually Abused: No    Family History: Family History  Problem Relation Age of Onset   Heart disease Father        Heart Disease before age 15   Pulmonary embolism Father    Deep vein thrombosis Father    Cancer Mother        Sarcoma    Current Medications:  Current Outpatient Medications:    amLODipine (NORVASC) 5 MG tablet, Take 2 tablets (10 mg total) by mouth daily. (Patient taking differently: Take 5 mg by mouth 2 (two) times daily.), Disp: 90 tablet, Rfl: 3   Ascorbic Acid (VITAMIN C) 1000 MG tablet, Take 1,000 mg by mouth in the morning., Disp:  , Rfl:    aspirin EC 81 MG tablet, Take 1 tablet (81 mg total) by mouth daily., Disp: , Rfl:    budesonide-formoterol (SYMBICORT) 80-4.5 MCG/ACT inhaler, Take 2 puffs first thing in am and then another 2 puffs about 12 hours later., Disp: 1 each, Rfl: 11  cholecalciferol (VITAMIN D) 25 MCG (1000 UNIT) tablet, Take 1,000 Units by mouth in the morning., Disp: , Rfl:    ezetimibe (ZETIA) 10 MG tablet, TAKE 1 TABLET BY MOUTH EVERY DAY, Disp: 90 tablet, Rfl: 3   famotidine (PEPCID) 20 MG tablet, Take 20 mg by mouth every evening., Disp: , Rfl:    fish oil-omega-3 fatty acids 1000 MG capsule, Take 1,000 mg by mouth in the morning., Disp: , Rfl:    folic acid (FOLVITE) [id number] MCG tablet, Take 400 mcg by mouth in the morning., Disp: , Rfl:    Garlic [id number] MG TBEC, Take 2,000 mg by mouth in the morning., Disp: , Rfl:    isosorbide mononitrate (IMDUR) 60 MG 24 hr tablet, TAKE 1 TABLET BY MOUTH EVERY DAY, Disp: 90 tablet, Rfl: 1   metoprolol tartrate (LOPRESSOR) 50 MG tablet, TAKE 1 TABLET BY MOUTH TWICE A DAY, Disp: 180 tablet, Rfl: 1   Multiple Vitamin (MULTIVITAMIN WITH MINERALS) TABS tablet, Take 1 tablet by mouth in the morning., Disp: , Rfl:    NP THYROID 30 MG tablet, TAKE 1 TABLET (30 MG TOTAL) BY MOUTH DAILY BEFORE BREAKFAST., Disp: 90 tablet, Rfl: 1   omeprazole (PRILOSEC OTC) 20 MG tablet, Take 1 tablet (20 mg total) by mouth daily. Take 30-60 min before first meal of the day, Disp: , Rfl:    rosuvastatin (CRESTOR) 10 MG tablet, TAKE 1 TABLET BY MOUTH EVERY DAY N, Disp: 90 tablet, Rfl: 3   thiamine (VITAMIN B-1) 100 MG tablet, Take 100 mg by mouth every evening. , Disp: , Rfl:    ticagrelor (BRILINTA) 60 MG TABS tablet, Take 1 tablet (60 mg total) by mouth 2 (two) times daily., Disp: 60 tablet, Rfl: 6   traMADol (ULTRAM) 50 MG tablet, Take 1 tablet (50 mg total) by mouth every 6 (six) hours as needed., Disp: 16 tablet, Rfl: 0   valsartan (DIOVAN) 160 MG tablet, TAKE 1 TABLET BY MOUTH EVERY DAY, Disp:  90 tablet, Rfl: 1   nitroGLYCERIN (NITROSTAT) 0.4 MG SL tablet, Place 1 tablet (0.4 mg total) under the tongue every 5 (five) minutes as needed for chest pain. If pain does not resolve with rest. (Patient not taking: Reported on 06/24/2022), Disp: 50 tablet, Rfl: 3   Allergies: No Known Allergies  REVIEW OF SYSTEMS:   Review of Systems  Constitutional:  Positive for fatigue. Negative for chills and fever.  HENT:   Negative for lump/mass, mouth sores, nosebleeds, sore throat and trouble swallowing.   Eyes:  Negative for eye problems.  Respiratory:  Positive for cough and shortness of breath.   Cardiovascular:  Negative for chest pain, leg swelling and palpitations.  Gastrointestinal:  Positive for constipation and diarrhea. Negative for abdominal pain, nausea and vomiting.  Genitourinary:  Negative for bladder incontinence, difficulty urinating, dysuria, frequency, hematuria and nocturia.   Musculoskeletal:  Positive for myalgias (leg pain, 8/10 in severity, chronic). Negative for arthralgias, back pain, flank pain and neck pain.  Skin:  Negative for itching and rash.  Neurological:  Negative for dizziness, headaches and numbness.  Hematological:  Does not bruise/bleed easily.  Psychiatric/Behavioral:  Negative for depression, sleep disturbance and suicidal ideas. The patient is not nervous/anxious.   All other systems reviewed and are negative.    VITALS:   Blood pressure (!) 155/46, pulse 76, temperature 97.7 F (36.5 C), temperature source Tympanic, resp. rate 18, height 5\' 6"  (1.676 m), weight 163 lb 12.8 oz (74.3 kg), SpO2 97 %.  Wt  Readings from Last 3 Encounters:  06/24/22 163 lb 12.8 oz (74.3 kg)  06/18/22 162 lb 9.6 oz (73.8 kg)  06/03/22 161 lb 13.1 oz (73.4 kg)    Body mass index is 26.44 kg/m.   PHYSICAL EXAM:   Physical Exam Vitals and nursing note reviewed. Exam conducted with a chaperone present.  Constitutional:      Appearance: Normal appearance.   Cardiovascular:     Rate and Rhythm: Normal rate and regular rhythm.     Pulses: Normal pulses.     Heart sounds: Normal heart sounds.  Pulmonary:     Effort: Pulmonary effort is normal.     Breath sounds: Normal breath sounds.  Abdominal:     Palpations: Abdomen is soft. There is no hepatomegaly, splenomegaly or mass.     Tenderness: There is no abdominal tenderness.  Musculoskeletal:     Right lower leg: No edema.     Left lower leg: No edema.  Lymphadenopathy:     Cervical: No cervical adenopathy.     Right cervical: No superficial, deep or posterior cervical adenopathy.    Left cervical: No superficial, deep or posterior cervical adenopathy.     Upper Body:     Right upper body: No supraclavicular or axillary adenopathy.     Left upper body: No supraclavicular or axillary adenopathy.  Neurological:     General: No focal deficit present.     Mental Status: He is alert and oriented to person, place, and time.  Psychiatric:        Mood and Affect: Mood normal.        Behavior: Behavior normal.     LABS:        Latest Ref Rng & Units 04/25/2022    8:49 AM 11/14/2021    1:53 PM 11/01/2021   10:04 AM  CBC  WBC 3.4 - 10.8 x10E3/uL  8.9  8.3   Hemoglobin 13.0 - 17.0 g/dL 12.9  11.1  11.6   Hematocrit 39.0 - 52.0 % 38.0  32.9  35.1   Platelets 150 - 450 x10E3/uL  320  311       Latest Ref Rng & Units 06/03/2022   10:16 AM 04/25/2022    8:49 AM 02/27/2022   11:05 AM  CMP  Glucose 70 - 99 mg/dL  96  105   BUN 8 - 23 mg/dL  41  31   Creatinine 0.61 - 1.24 mg/dL  2.20  1.60   Sodium 135 - 145 mmol/L  138  140   Potassium 3.5 - 5.1 mmol/L  5.1  4.9   Chloride 98 - 111 mmol/L  106  108   CO2 19 - 32 mEq/L   25   Calcium 8.6 - 10.2 mg/dL 9.7   9.8   Total Protein 6.0 - 8.3 g/dL   7.3   Total Bilirubin 0.2 - 1.2 mg/dL   0.5   Alkaline Phos 39 - 117 U/L   50   AST 0 - 37 U/L   23   ALT 0 - 53 U/L   18      No results found for: "CEA1", "CEA" / No results found for:  "CEA1", "CEA" No results found for: "PSA1" No results found for: "CAN199" No results found for: "CAN125"  Lab Results  Component Value Date   TOTALPROTELP 6.5 06/03/2022   Lab Results  Component Value Date   TIBC 403 06/03/2022   FERRITIN 44 06/03/2022   IRONPCTSAT 26 06/03/2022  Lab Results  Component Value Date   LDH 148 06/03/2022     STUDIES:   DG Bone Survey Met  Result Date: 06/04/2022 CLINICAL DATA:  MGUS EXAM: METASTATIC BONE SURVEY COMPARISON:  None Available. FINDINGS: Metastatic bone survey was performed. Lateral view of the skull shows no lytic or sclerotic lesions. For extremities demonstrate degenerative changes of the glenohumeral joints bilaterally. No lytic or sclerotic lesions are seen. Degenerative changes of the cervical spine are noted. No lytic lesions are seen. No compression deformity is noted. The thoracic spine also demonstrates osteophytic change without vertebral collapse. The lumbar spine demonstrates osteophytic change is well. 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 Lungs are clear. Ribcage shows no lytic or sclerotic lesions. Pelvis demonstrates vascular stenting and heavy calcifications. No lytic or sclerotic lesions are seen. The lower extremities show no lytic or sclerotic lesions. IMPRESSION: No evidence of lytic or sclerotic lesions to correspond with the given clinical history. 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 Recommend follow-up ultrasound every 3 years. This recommendation follows ACR consensus guidelines: White Paper of the ACR Incidental Findings Committee II on Vascular Findings. J Am Coll Radiol 2013; 10:789-794. Electronically Signed   By: Inez Catalina M.D.   On: 06/04/2022 20:13

## 2022-06-26 ENCOUNTER — Encounter: Payer: Self-pay | Admitting: Vascular Surgery

## 2022-06-26 ENCOUNTER — Ambulatory Visit (INDEPENDENT_AMBULATORY_CARE_PROVIDER_SITE_OTHER): Payer: Medicare Other | Admitting: Vascular Surgery

## 2022-06-26 VITALS — BP 140/56 | HR 58 | Temp 98.2°F | Resp 20 | Ht 66.0 in | Wt 165.0 lb

## 2022-06-26 DIAGNOSIS — I70219 Atherosclerosis of native arteries of extremities with intermittent claudication, unspecified extremity: Secondary | ICD-10-CM | POA: Diagnosis not present

## 2022-06-26 LAB — UIFE/LIGHT CHAINS/TP QN, 24-HR UR
FR KAPPA LT CH,24HR: 0.41 mg/24 hr
FR LAMBDA LT CH,24HR: 0.06 mg/24 hr
Free Kappa Lt Chains,Ur: 135.98 mg/L — ABNORMAL HIGH (ref 1.17–86.46)
Free Kappa/Lambda Ratio: 6.79 (ref 1.83–14.26)
Free Lambda Lt Chains,Ur: 20.02 mg/L — ABNORMAL HIGH (ref 0.27–15.21)
Total Protein, Urine-Ur/day: 0 mg/24 hr — ABNORMAL LOW (ref 30–150)
Total Protein, Urine: 11.6 mg/dL
Total Volume: 850

## 2022-06-26 NOTE — Progress Notes (Signed)
REASON FOR VISIT:   Follow-up of multilevel arterial occlusive disease.  MEDICAL ISSUES:   MULTILEVEL ARTERIAL OCCLUSIVE DISEASE: This patient's symptoms in the right leg have improved and he is no longer having rest pain.  He has stable claudication bilaterally.  Given that his symptoms are stable, and he was felt to be high risk for surgery after cardiac evaluation, I think we should sit tight for now.  He is not a smoker.  I have encouraged him to stay as active as possible.  He does remain fairly active which I think is why he is doing well.  If his symptoms progress he would require an extensive iliofemoral endarterectomy with possible proximal stenting intraoperatively.  This will be associated with significant cardiac risk.  I think we also have to use some contrast and he does have a history of chronic kidney disease.  I will plan on seeing him back in September.  We would only consider revascularization if he develops critical limb ischemia with rest pain or nonhealing ulcer.  He will call sooner if something changes.  I have ordered follow-up ABIs when he returns in September.  BILATERAL CAROTID BRUITS: He does have bilateral carotid bruits.  His last duplex was in 2022 at which time he had no significant carotid disease.  I will obtain a follow-up carotid duplex scan when he returns in September.  HPI:   William [name]. is a pleasant 82 y.o. male who I have been following with stable claudication of both lower extremities and multilevel arterial occlusive disease.  I had last seen him in October 2022.  He then resurfaced with worsening symptoms and on 04/25/2022 underwent an arteriogram.  He had diffuse aortoiliac occlusive disease with severe calcific disease bilaterally.  He also had bulky plaques in both common femoral arteries and superficial femoral artery occlusions.  His symptoms were worse on the right side.  I felt the best option would be to address his inflow disease on the  right.  This would involve a right common femoral artery endarterectomy and possible profundoplasty.  He could also have an intraoperative arteriogram with limited contrast to evaluate his right common iliac artery which which could not be seen well with CO2.  I felt that an infrainguinal bypass would not be necessary hopefully.  He had a very small posterior tibial artery distally in 2019 which was his only patent tibial.  Prior to considering surgery he was sent for preoperative cardiac evaluation. He was seen by cardiology on 06/18/2022.  He has known multivessel disease and is status post coronary revascularization.  It was noted that he had stable angina and based on his risk stratification he had an 11% chance of major adverse cardiac event.  Thus he had an overall high risk.  On my history today, he states that his claudication in both legs is stable.  They are equal on both sides.  He can walk to our parking lot before experiencing symptoms.  He denies any history of rest pain or nonhealing ulcers.  He has had no recent chest pain.  He denies any history of stroke, TIAs, expressive or receptive aphasia, or amaurosis fugax.  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[facility])    Myocardial infarction ([place])    Peripheral arterial disease (HCC)    Suboptimal revascularization options    Family History  Problem Relation Age of Onset   Heart disease Father        Heart Disease before age 75   Pulmonary embolism Father    Deep vein thrombosis Father    Cancer Mother        Sarcoma    SOCIAL HISTORY: Social History   Tobacco Use   Smoking status: Former    Packs/day: 2.00    Years: 30.00    Additional pack years: 0.00    Total pack  years: 60.00    Types: Cigarettes    Start date: 06/03/1961    Quit date: 11/17/1991    Years since quitting: 30.6    Passive exposure: Never   Smokeless tobacco: Former    Types: Snuff, Chew  Substance Use Topics   Alcohol use: Not Currently    Alcohol/week: 2.0 - 4.0 standard drinks of alcohol    Types: 2 - 4 Standard drinks or equivalent per week    Comment: used to, quit 2012    No Known Allergies  Current Outpatient Medications  Medication Sig Dispense Refill   amLODipine (NORVASC) 5 MG tablet Take 2 tablets (10 mg total) by mouth daily. (Patient taking differently: Take 5 mg by mouth 2 (two) times daily.) 90 tablet 3   Ascorbic Acid (VITAMIN C) 1000 MG tablet Take 1,000 mg by mouth in the morning.     aspirin EC 81 MG tablet Take 1 tablet (81 mg total) by mouth daily.     budesonide-formoterol (SYMBICORT) 80-4.5 MCG/ACT inhaler Take 2 puffs first thing in am and then another 2 puffs about 12 hours later. 1 each 11   cholecalciferol (VITAMIN D) 25 MCG (1000 UNIT) tablet Take 1,000 Units by mouth in the morning.     ezetimibe (ZETIA) 10 MG tablet TAKE 1 TABLET BY MOUTH EVERY DAY 90 tablet 3   famotidine (PEPCID) 20 MG tablet Take 20 mg by mouth every evening.     fish oil-omega-3 fatty acids 1000 MG capsule Take 1,000 mg by mouth in the morning.     folic acid (FOLVITE) [id number] MCG tablet Take 400 mcg by mouth in the morning.     Garlic [id number] MG TBEC Take 2,000 mg by mouth in the morning.     isosorbide mononitrate (IMDUR) 60 MG 24 hr tablet TAKE 1 TABLET BY MOUTH EVERY DAY 90 tablet 1   metoprolol tartrate (LOPRESSOR) 50 MG tablet TAKE 1 TABLET BY MOUTH TWICE A DAY 180 tablet 1   Multiple Vitamin (MULTIVITAMIN WITH MINERALS) TABS tablet Take 1 tablet by mouth in the morning.     nitroGLYCERIN (NITROSTAT) 0.4 MG SL tablet Place 1 tablet (0.4 mg total) under the tongue every 5 (five) minutes as needed for chest pain. If pain does not resolve with rest. 50 tablet 3   NP THYROID 30 MG  tablet TAKE 1 TABLET (30 MG TOTAL) BY MOUTH DAILY BEFORE BREAKFAST. 90 tablet 1   omeprazole (PRILOSEC OTC) 20 MG tablet Take 1 tablet (20 mg total) by mouth daily. Take 30-60 min before first meal of the day     rosuvastatin (CRESTOR) 10 MG tablet TAKE 1 TABLET BY MOUTH EVERY DAY N 90 tablet 3   thiamine (VITAMIN B-1) 100 MG tablet Take 100 mg by mouth every evening.  ticagrelor (BRILINTA) 60 MG TABS tablet Take 1 tablet (60 mg total) by mouth 2 (two) times daily. 60 tablet 6   traMADol (ULTRAM) 50 MG tablet Take 1 tablet (50 mg total) by mouth every 6 (six) hours as needed. 16 tablet 0   valsartan (DIOVAN) 160 MG tablet TAKE 1 TABLET BY MOUTH EVERY DAY 90 tablet 1   No current facility-administered medications for this visit.    REVIEW OF SYSTEMS:  [X]  denotes positive finding, [ ]  denotes negative finding Cardiac  Comments:  Chest pain or chest pressure:    Shortness of breath upon exertion: x   Short of breath when lying flat:    Irregular heart rhythm:        Vascular    Pain in calf, thigh, or hip brought on by ambulation:    Pain in feet at night that wakes you up from your sleep:     Blood clot in your veins:    Leg swelling:         Pulmonary    Oxygen at home:    Productive cough:     Wheezing:         Neurologic    Sudden weakness in arms or legs:     Sudden numbness in arms or legs:     Sudden onset of difficulty speaking or slurred speech:    Temporary loss of vision in one eye:     Problems with dizziness:         Gastrointestinal    Blood in stool:     Vomited blood:         Genitourinary    Burning when urinating:     Blood in urine:        Psychiatric    Major depression:         Hematologic    Bleeding problems:    Problems with blood clotting too easily:        Skin    Rashes or ulcers:        Constitutional    Fever or chills:     PHYSICAL EXAM:   Vitals:   06/26/22 1037  BP: (!) 140/56  Pulse: (!) 58  Resp: 20  Temp: 98.2 F  (36.8 C)  SpO2: 92%  Weight: 165 lb (74.8 kg)  Height: 5\' 6"  (1.676 m)    GENERAL: The patient is a well-nourished male, in no acute distress. The vital signs are documented above. CARDIAC: There is a regular rate and rhythm.  VASCULAR: He has bilateral carotid bruits. I cannot palpate a right femoral pulse.  He does have a left femoral pulse. I cannot palpate pedal pulses. PULMONARY: There is good air exchange bilaterally without wheezing or rales. ABDOMEN: Soft and non-tender with normal pitched bowel sounds.  MUSCULOSKELETAL: There are no major deformities or cyanosis. NEUROLOGIC: No focal weakness or paresthesias are detected. SKIN: There are no ulcers or rashes noted. PSYCHIATRIC: The patient has a normal affect.  DATA:    AORTOGRAM: I reviewed his CO2 aortogram which was done on 04/25/2022.  This showed diffuse aortoiliac occlusive disease with severe calcific disease.  Images were not ideal given that this was all done with CO2.  LABS: Most recent labs I have show a creatinine of 2.2 on 04/25/2022.  [name] [facility] [phone]

## 2022-07-02 ENCOUNTER — Other Ambulatory Visit: Payer: Self-pay

## 2022-07-02 DIAGNOSIS — R0989 Other specified symptoms and signs involving the circulatory and respiratory systems: Secondary | ICD-10-CM

## 2022-07-02 DIAGNOSIS — I739 Peripheral vascular disease, unspecified: Secondary | ICD-10-CM

## 2022-07-02 DIAGNOSIS — I70219 Atherosclerosis of native arteries of extremities with intermittent claudication, unspecified extremity: Secondary | ICD-10-CM

## 2022-07-29 DIAGNOSIS — N1832 Chronic kidney disease, stage 3b: Secondary | ICD-10-CM | POA: Diagnosis not present

## 2022-08-08 DIAGNOSIS — E559 Vitamin D deficiency, unspecified: Secondary | ICD-10-CM | POA: Diagnosis not present

## 2022-08-08 DIAGNOSIS — E872 Acidosis, unspecified: Secondary | ICD-10-CM | POA: Diagnosis not present

## 2022-08-08 DIAGNOSIS — D472 Monoclonal gammopathy: Secondary | ICD-10-CM | POA: Diagnosis not present

## 2022-08-08 DIAGNOSIS — I739 Peripheral vascular disease, unspecified: Secondary | ICD-10-CM | POA: Diagnosis not present

## 2022-08-08 DIAGNOSIS — I129 Hypertensive chronic kidney disease with stage 1 through stage 4 chronic kidney disease, or unspecified chronic kidney disease: Secondary | ICD-10-CM | POA: Diagnosis not present

## 2022-08-08 DIAGNOSIS — I2510 Atherosclerotic heart disease of native coronary artery without angina pectoris: Secondary | ICD-10-CM | POA: Diagnosis not present

## 2022-08-08 DIAGNOSIS — D631 Anemia in chronic kidney disease: Secondary | ICD-10-CM | POA: Diagnosis not present

## 2022-08-08 DIAGNOSIS — N1832 Chronic kidney disease, stage 3b: Secondary | ICD-10-CM | POA: Diagnosis not present

## 2022-08-19 ENCOUNTER — Other Ambulatory Visit: Payer: Self-pay | Admitting: Cardiology

## 2022-08-21 ENCOUNTER — Other Ambulatory Visit: Payer: Self-pay | Admitting: Cardiology

## 2022-08-27 ENCOUNTER — Other Ambulatory Visit: Payer: Self-pay | Admitting: Internal Medicine

## 2022-08-27 ENCOUNTER — Other Ambulatory Visit: Payer: Self-pay | Admitting: Cardiology

## 2022-09-03 ENCOUNTER — Other Ambulatory Visit: Payer: Self-pay | Admitting: Cardiology

## 2022-09-03 ENCOUNTER — Other Ambulatory Visit: Payer: Self-pay | Admitting: Nurse Practitioner

## 2022-09-03 DIAGNOSIS — R5383 Other fatigue: Secondary | ICD-10-CM

## 2022-09-18 ENCOUNTER — Ambulatory Visit: Payer: Medicare Other | Attending: Nurse Practitioner | Admitting: Nurse Practitioner

## 2022-09-18 ENCOUNTER — Encounter: Payer: Self-pay | Admitting: Nurse Practitioner

## 2022-09-18 ENCOUNTER — Telehealth: Payer: Self-pay | Admitting: Nurse Practitioner

## 2022-09-18 VITALS — BP 132/60 | HR 71 | Ht 66.0 in | Wt 163.8 lb

## 2022-09-18 DIAGNOSIS — I739 Peripheral vascular disease, unspecified: Secondary | ICD-10-CM | POA: Diagnosis not present

## 2022-09-18 DIAGNOSIS — N183 Chronic kidney disease, stage 3 unspecified: Secondary | ICD-10-CM | POA: Diagnosis not present

## 2022-09-18 DIAGNOSIS — I10 Essential (primary) hypertension: Secondary | ICD-10-CM | POA: Diagnosis not present

## 2022-09-18 DIAGNOSIS — R0609 Other forms of dyspnea: Secondary | ICD-10-CM | POA: Insufficient documentation

## 2022-09-18 DIAGNOSIS — I6523 Occlusion and stenosis of bilateral carotid arteries: Secondary | ICD-10-CM | POA: Insufficient documentation

## 2022-09-18 DIAGNOSIS — R5382 Chronic fatigue, unspecified: Secondary | ICD-10-CM | POA: Insufficient documentation

## 2022-09-18 DIAGNOSIS — I723 Aneurysm of iliac artery: Secondary | ICD-10-CM | POA: Insufficient documentation

## 2022-09-18 DIAGNOSIS — J449 Chronic obstructive pulmonary disease, unspecified: Secondary | ICD-10-CM | POA: Insufficient documentation

## 2022-09-18 DIAGNOSIS — E785 Hyperlipidemia, unspecified: Secondary | ICD-10-CM | POA: Diagnosis not present

## 2022-09-18 DIAGNOSIS — I2510 Atherosclerotic heart disease of native coronary artery without angina pectoris: Secondary | ICD-10-CM | POA: Diagnosis not present

## 2022-09-18 MED ORDER — NITROGLYCERIN 0.4 MG SL SUBL: 0.4000 mg | SUBLINGUAL_TABLET | SUBLINGUAL | 3 refills | Status: DC | PRN

## 2022-09-18 NOTE — Patient Instructions (Addendum)
Medication Instructions:  Your physician recommends that you continue on your current medications as directed. Please refer to the Current Medication list given to you today.  Labwork: none  Testing/Procedures: ECHO/DOE  Follow-Up: Your physician recommends that you schedule a follow-up appointment in: 2-3 months  Any Other Special Instructions Will Be Listed Below (If Applicable). Your physician has requested that you have an echocardiogram. Echocardiography is a painless test that uses sound waves to create images of your heart. It provides your doctor with information about the size and shape of your heart and how well your heart's chambers and valves are working. This procedure takes approximately one hour. There are no restrictions for this procedure. Please do NOT wear cologne, perfume, aftershave, or lotions (deodorant is allowed). Please arrive 15 minutes prior to your appointment time.   If you need a refill on your cardiac medications before your next appointment, please call your pharmacy.

## 2022-09-18 NOTE — Telephone Encounter (Signed)
Checking percert on the following patient for testing scheduled at [facility].   ECHO - 10/31/2022

## 2022-09-18 NOTE — Progress Notes (Signed)
Office Visit    Patient Name: William [name]. Date of Encounter: 09/18/2022  PCP:  William Paddy, NP   [facility]  Cardiologist:  William Dell, MD  Advanced Practice Provider:  No care team member to display Electrophysiologist:  None   Chief Complaint    William Crimes. is a 82 y.o. male with a hx of CAD s/p CABG, carotid artery disease, hyperlipidemia, hypertension, PAD, COPD, history of iliac artery aneurysm, CKD, and history of monoclonal gammopathy, who presents today for 3 month follow-up.  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[facility])    Myocardial infarction (HCC)    Peripheral arterial disease (HCC)    Suboptimal revascularization options   Past Surgical History:  Procedure Laterality Date   ABDOMINAL AORTOGRAM W/LOWER EXTREMITY N/A 08/21/2017   Procedure: ABDOMINAL AORTOGRAM W/LOWER EXTREMITY;  Surgeon: William Hint, MD;  Location[facility] INVASIVE CV LAB;  Service: Cardiovascular;  Laterality: N/A;   ABDOMINAL AORTOGRAM W/LOWER EXTREMITY N/A 04/25/2022   Procedure: ABDOMINAL AORTOGRAM W/LOWER EXTREMITY;  Surgeon: William Hint, MD;  Location: [facility] INVASIVE CV LAB;  Service: Cardiovascular;  Laterality: N/A;   Bilateral renal  artery stenoses  04/23/2009   CARDIAC CATHETERIZATION N/A 05/21/2015   Procedure: Left Heart Cath and Coronary Angiography;  Surgeon: William Gess, MD;  Location: [facility] INVASIVE CV LAB;  Service: Cardiovascular;  Laterality: N/A;   CORONARY ARTERY BYPASS GRAFT N/A 05/22/2015   Procedure: CORONARY ARTERY BYPASS GRAFTING (CABG) LIMA-LAD and SVG-OM EVH RIGHT THIGH GREATER SAPHENOUS VEIN;  Surgeon: William Ovens, MD;   Location: [facility] OR;  Service: Open Heart Surgery;  Laterality: N/A;   CORONARY ATHERECTOMY N/A 12/21/2020   Procedure: CORONARY ATHERECTOMY;  Surgeon: William Hazel, MD;  Location: [facility] INVASIVE CV LAB;  Service: Cardiovascular;  Laterality: N/A;   CORONARY BALLOON ANGIOPLASTY N/A 12/06/2020   Procedure: CORONARY BALLOON ANGIOPLASTY;  Surgeon: William Hazel, MD;  Location: [facility] INVASIVE CV LAB;  Service: Cardiovascular;  Laterality: N/A;   CORONARY STENT INTERVENTION N/A 12/21/2020   Procedure: CORONARY STENT INTERVENTION;  Surgeon: William Hazel, MD;  Location: [facility] INVASIVE CV LAB;  Service: Cardiovascular;  Laterality: N/A;   CORONARY ULTRASOUND/IVUS N/A 12/21/2020   Procedure: Intravascular Ultrasound/IVUS;  Surgeon: William Hazel, MD;  Location: [facility] INVASIVE CV LAB;  Service: Cardiovascular;  Laterality: N/A;   HIATAL HERNIA REPAIR     LEFT HEART CATH AND CORS/GRAFTS ANGIOGRAPHY N/A 12/06/2020   Procedure: LEFT HEART CATH AND CORS/GRAFTS ANGIOGRAPHY;  Surgeon: William Hazel, MD;  Location: [facility] INVASIVE CV LAB;  Service: Cardiovascular;  Laterality: N/A;   Percutaneous translumnial angioplasty  04/23/2009   Catheterization of [place] external iliac artery with Left lower extremity runoff   TEE WITHOUT CARDIOVERSION N/A 05/22/2015   Procedure: TRANSESOPHAGEAL ECHOCARDIOGRAM (TEE);  Surgeon: William Ovens, MD;  Location: [facility] OR;  Service: Open Heart Surgery;  Laterality: N/A;    Allergies  No Known Allergies  History of Present Illness    William Herne. is a 82 y.o. male with a PMH as mentioned above.  Previous cardiovascular history of multivessel CAD,  s/p CABG in 2017 with LIMA-LAD, SVG-OM1.  History of subsequent graft disease, underwent DES x 3 overlapping within proximal to distal RCA in 2022, limited revascularization options.  Underwent artery grafting with femoral and iliac angiograms in January 2024, closely followed by William [name].  Last  seen by William [name] on June 18, 2022 for routine visit and preoperative cardiac evaluation.  Was being considered for revascularization to include right common femoral artery endarterectomy and possible profundoplasty, presumably under general anesthesia.  Patient noted stable angina and NYHA class II-III dyspnea.  Brilinta reduced to 60 mg twice daily.  Today presents for 35-month follow-up. Continues to note fatigue, stable and chronic DOE. Denies any chest pain, palpitations, syncope, presyncope, dizziness, orthopnea, PND, swelling or significant weight changes, acute bleeding, or claudication.   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   EKGs/Labs/Other Studies Reviewed:   The following studies were reviewed today:   EKG:  EKG is not ordered today.    Echo 09/2021:  1. Left ventricular ejection fraction, by estimation, is 65 to 70%. The  left ventricle has normal function. The left ventricle has no regional  wall motion abnormalities. There is mild left ventricular hypertrophy.  Left ventricular diastolic parameters  are consistent with Grade I diastolic dysfunction (impaired relaxation).  The average left ventricular global longitudinal strain is -19.9 %. The  global longitudinal strain is normal.   2. Right ventricular systolic function is normal. The right ventricular  size is normal. Tricuspid regurgitation signal is inadequate for assessing  PA pressure.   3. Left atrial size was mildly dilated.   4. The mitral valve is grossly normal. Trivial mitral valve  regurgitation.   5. The aortic valve is tricuspid. There is mild calcification of the  aortic valve. Aortic valve regurgitation is mild. Aortic valve  sclerosis/calcification is present, without any evidence of aortic  stenosis. Aortic regurgitation PHT measures 786 msec.   6. The inferior vena cava is normal in size with greater than 50%  respiratory variability, suggesting right atrial pressure of 3 mmHg.    Comparison(s): No significant change in LVEF.  Recent Labs: 11/14/2021: Platelets 320 02/27/2022: ALT 18; TSH 1.17 04/25/2022: BUN 41; Creatinine, Ser 2.20; Hemoglobin 12.9; Potassium 5.1; Sodium 138  Recent Lipid Panel    Component Value Date/Time   CHOL 101 02/27/2022 1105   TRIG 84.0 02/27/2022 1105   HDL 41.40 02/27/2022 1105   CHOLHDL 2 02/27/2022 1105   VLDL 16.8 02/27/2022 1105   LDLCALC 43 02/27/2022 1105   LDLCALC 81 07/04/2020 1018     Home Medications   Current Meds  Medication Sig   amLODipine (NORVASC) 5 MG tablet Take 2 tablets (10 mg total) by mouth daily. (Patient taking differently: Take 5 mg by mouth 2 (two) times daily.)   Ascorbic Acid (VITAMIN C) 1000 MG tablet Take 1,000 mg by mouth in the morning.   aspirin EC 81 MG tablet Take 1 tablet (81 mg total) by mouth daily.   budesonide-formoterol (SYMBICORT) 80-4.5 MCG/ACT inhaler Take 2 puffs first thing in am and then another 2 puffs about 12 hours later.   cholecalciferol (VITAMIN D) 25 MCG (1000 UNIT) tablet Take 1,000 Units by mouth in the morning.   ezetimibe (ZETIA) 10 MG tablet TAKE 1 TABLET BY MOUTH EVERY DAY   famotidine (PEPCID) 20 MG tablet Take 20 mg by mouth every evening.   fish oil-omega-3 fatty acids 1000 MG capsule Take 1,000 mg by mouth in the morning.  folic acid (FOLVITE) 400 MCG tablet Take 400 mcg by mouth in the morning.   Garlic 2000 MG TBEC Take 2,000 mg by mouth in the morning.   isosorbide mononitrate (IMDUR) 60 MG 24 hr tablet TAKE 1 TABLET BY MOUTH EVERY DAY   metoprolol tartrate (LOPRESSOR) 50 MG tablet TAKE 1 TABLET BY MOUTH TWICE A DAY   Multiple Vitamin (MULTIVITAMIN WITH MINERALS) TABS tablet Take 1 tablet by mouth in the morning.   NP THYROID 30 MG tablet TAKE 1 TABLET (30 MG TOTAL) BY MOUTH DAILY BEFORE BREAKFAST   omeprazole (PRILOSEC OTC) 20 MG tablet Take 1 tablet (20 mg total) by mouth daily. Take 30-60 min before first meal of the day   rosuvastatin (CRESTOR) 10 MG  tablet TAKE 1 TABLET BY MOUTH EVERY DAY   sodium bicarbonate 650 MG tablet Take 650 mg by mouth 2 (two) times daily.   thiamine (VITAMIN B-1) 100 MG tablet Take 100 mg by mouth every evening.    ticagrelor (BRILINTA) 60 MG TABS tablet Take 1 tablet (60 mg total) by mouth 2 (two) times daily.   traMADol (ULTRAM) 50 MG tablet Take 1 tablet (50 mg total) by mouth every 6 (six) hours as needed.   valsartan (DIOVAN) 160 MG tablet TAKE 1 TABLET BY MOUTH EVERY DAY   nitroGLYCERIN (NITROSTAT) 0.4 MG SL tablet Place 1 tablet (0.4 mg total) under the tongue every 5 (five) minutes as needed for chest pain. If pain does not resolve with rest.     Review of Systems    All other systems reviewed and are otherwise negative except as noted above.  Physical Exam    VS:  BP 132/60   Pulse 71   Ht 5\' 6"  (1.676 m)   Wt 163 lb 12.8 oz (74.3 kg)   SpO2 98%   BMI 26.44 kg/m  , BMI Body mass index is 26.44 kg/m.  Wt Readings from Last 3 Encounters:  09/18/22 163 lb 12.8 oz (74.3 kg)  06/26/22 165 lb (74.8 kg)  06/24/22 163 lb 12.8 oz (74.3 kg)     GEN: Well nourished, well developed, in no acute distress. HEENT: normal. Neck: Supple, no JVD, carotid bruits, or masses. Cardiac: S1/S2,RRR, no murmurs, rubs, or gallops. No clubbing, cyanosis, edema.  Radials/PT 2+ and equal bilaterally.  Respiratory:  Respirations regular and unlabored, clear to auscultation bilaterally. MS: No deformity or atrophy. Skin: Warm and dry, no rash. Neuro:  Strength and sensation are intact. Psych: Normal affect.  Assessment & Plan    CAD s/p CABG Stable with no anginal symptoms. No indication for ischemic evaluation. Continue Aspirin, Brilinta, Zetia, Imdur, Lopressor, rosuvastatin, valsartan, and will refill NTG. Heart healthy diet and regular cardiovascular exercise encouraged.   Carotid artery disease, HLD, PAD 1-39% stenosis along bilateral ICA's. Left subclavian artery was stenotic. [place] hx of multilevel  arterial occlusive dx. Underwent arteriography 03/2022, revealed moderate left renal artery stenosis, William [name] advised best option would be for revascularization via right common femoral artery endarterectomy and possible profundoplasty. Denies any issues. Continue to follow-up with William [name]. No medication changes at this time.   3. Hypertension BP stable. Discussed to monitor BP at home at least 2 hours after medications and sitting for 5-10 minutes. Continue current medication regimen. Heart healthy diet and regular cardiovascular exercise encouraged.   COPD, DOE Stable, chronic DOE. Continue current medication regimen and continue to follow-up with Dr. Sherene [name].   History of iliac artery aneurysm  Denies any issues. Continue  to follow-up with [facility].   CKD stage 3 Most recent labs revealed sCr at 1.60 (stable) with eGFR at 40.38. Avoid nephrotoxic agents. Will be getting labs with Hem/Onc. Continue to follow with PCP.  6. Fatigue  Etiology multifactorial, has been ongoing since last cardiac catheterization. Will update Echocardiogram at this time. Continue to follow-up with Hem/Onc as well as PCP.   Disposition: Follow up in 2-3 month(s) with William Dell, MD or APP.  Signed, Sharlene Dory, NP 09/18/2022, 11:46 AM [facility]

## 2022-10-09 ENCOUNTER — Ambulatory Visit (INDEPENDENT_AMBULATORY_CARE_PROVIDER_SITE_OTHER): Payer: Medicare Other | Admitting: Nurse Practitioner

## 2022-10-09 VITALS — BP 142/68 | HR 62 | Temp 98.6°F | Ht 66.0 in | Wt 166.5 lb

## 2022-10-09 DIAGNOSIS — I25118 Atherosclerotic heart disease of native coronary artery with other forms of angina pectoris: Secondary | ICD-10-CM

## 2022-10-09 DIAGNOSIS — J449 Chronic obstructive pulmonary disease, unspecified: Secondary | ICD-10-CM | POA: Diagnosis not present

## 2022-10-09 DIAGNOSIS — I10 Essential (primary) hypertension: Secondary | ICD-10-CM

## 2022-10-09 DIAGNOSIS — N1831 Chronic kidney disease, stage 3a: Secondary | ICD-10-CM

## 2022-10-09 DIAGNOSIS — D472 Monoclonal gammopathy: Secondary | ICD-10-CM | POA: Diagnosis not present

## 2022-10-09 NOTE — Assessment & Plan Note (Signed)
Chronic Continue follow-up with hematology

## 2022-10-09 NOTE — Assessment & Plan Note (Signed)
Chronic Stable Continue Symbicort Up-to-date on pneumonia vaccination, recommend flu vaccine for upcoming season

## 2022-10-09 NOTE — Assessment & Plan Note (Signed)
Chronic Blood pressure reasonable today on exam. Continue amlodipine 5 mg twice a day, Imdur 60 mg daily, metoprolol 50 mg twice a day, valsartan 100 mg daily.

## 2022-10-09 NOTE — Assessment & Plan Note (Signed)
Chronic Continue follow-up with cardiology.  Control risk factors, continue antihypertensives Continue aspirin 81 mg daily, continue Brilinta 60 mg twice a day, continue rosuvastatin 10 mg daily

## 2022-10-09 NOTE — Assessment & Plan Note (Signed)
Chronic, stable Continue follow-up with nephrology

## 2022-10-09 NOTE — Progress Notes (Signed)
Established Patient Office Visit  Subjective   Patient ID: William Kaser., male    DOB: [birth year]  Age: 82 y.o. MRN: [id number]  Chief Complaint  Patient presents with   Hypertension    Patient arrives for routine follow-up of chronic medical conditions.  He follows with cardiology, nephrology, oncology, and vascular surgery.  Continues to have intermittent claudication, but was identified to be at high risk for any surgical intervention at this time.  For now he is being treated with medical therapy and close monitoring.  Patient reports symptoms are stable.  Per chart review last metabolic panel identified stable kidney function.  He denies any recent chest pain or cardiac palpitations.  M spike was noted on labs with nephrologist and now patient is being monitored by hematologist with repeat labs periodically.  He decided to no longer follow-up with pulmonology for evaluation of his COPD.  Reports that his breathing is stable although he does did short of breath with physical exertion.     Review of Systems  Eyes:  Negative for blurred vision.  Respiratory:  Positive for shortness of breath. Negative for cough and wheezing.   Cardiovascular:  Positive for claudication. Negative for chest pain and palpitations.  Neurological:  Negative for dizziness and headaches.      Objective:     BP (!) 142/68   Pulse 62   Temp 98.6 F (37 C) (Temporal)   Ht 5\' 6"  (1.676 m)   Wt 166 lb 8 oz (75.5 kg)   SpO2 92%   BMI 26.87 kg/m  BP Readings from Last 3 Encounters:  10/09/22 (!) 142/68  09/18/22 132/60  06/26/22 (!) 140/56   Wt Readings from Last 3 Encounters:  10/09/22 166 lb 8 oz (75.5 kg)  09/18/22 163 lb 12.8 oz (74.3 kg)  06/26/22 165 lb (74.8 kg)      Physical Exam Vitals reviewed.  Constitutional:      Appearance: Normal appearance.  HENT:     Head: Normocephalic and atraumatic.  Cardiovascular:     Rate and Rhythm: Normal rate and regular rhythm.   Pulmonary:     Effort: Pulmonary effort is normal.     Breath sounds: Normal breath sounds.  Musculoskeletal:     Cervical back: Neck supple.  Skin:    General: Skin is warm and dry.  Neurological:     Mental Status: He is alert and oriented to person, place, and time.  Psychiatric:        Mood and Affect: Mood normal.        Behavior: Behavior normal.        Thought Content: Thought content normal.        Judgment: Judgment normal.      No results found for any visits on 10/09/22.    The ASCVD Risk score (Arnett DK, et al., 2019) failed to calculate for the following reasons:   The 2019 ASCVD risk score is only valid for ages 57 to 45   The patient has a prior MI or stroke diagnosis    Assessment & Plan:   Problem List Items Addressed This Visit       Cardiovascular and Mediastinum   Essential hypertension - Primary    Chronic Blood pressure reasonable today on exam. Continue amlodipine 5 mg twice a day, Imdur 60 mg daily, metoprolol 50 mg twice a day, valsartan 100 mg daily.      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  Control risk factors, continue antihypertensives Continue aspirin 81 mg daily, continue Brilinta 60 mg twice a day, continue rosuvastatin 10 mg daily        Respiratory   COPD GOLD 1/ AB pattern    Chronic Stable Continue Symbicort Up-to-date on pneumonia vaccination, recommend flu vaccine for upcoming season        Genitourinary   CKD stage 3a, GFR 45-59 ml/min (HCC)    Chronic, stable Continue follow-up with nephrology        Other   MGUS (monoclonal gammopathy of unknown significance)    Chronic Continue follow-up with hematology       Return in about 6 months (around 04/11/2023) for F/U with Bretta Fees.    [name], NP

## 2022-10-16 NOTE — Patient Instructions (Incomplete)
Please continue using your CPAP regularly. While your insurance requires that you use CPAP at least 4 hours each night on 70% of the nights, I recommend, that you not skip any nights and use it throughout the night if you can. Getting used to CPAP and staying with the treatment long term does take time and patience and discipline. Untreated obstructive sleep apnea when it is moderate to severe can have an adverse impact on cardiovascular health and raise her risk for heart disease, arrhythmias, hypertension, congestive heart failure, stroke and diabetes. Untreated obstructive sleep apnea causes sleep disruption, nonrestorative sleep, and sleep deprivation. This can have an impact on your day to day functioning and cause daytime sleepiness and impairment of cognitive function, memory loss, mood disturbance, and problems focussing. Using CPAP regularly can improve these symptoms.  We will update supply orders, today. I will send another mask refitting order to [facility]. Please reach out to schedule visit with [facility].   DME: [facility] Phone: [phone]  Follow up in 1 year

## 2022-10-16 NOTE — Progress Notes (Unsigned)
PATIENT: William [name]. DOB: 07-18-1940  REASON FOR VISIT: follow up HISTORY FROM: patient  No chief complaint on file.    HISTORY OF PRESENT ILLNESS:  10/16/22 ALL: Roddrick returns for follow up for OSA on CPAP. He was last seen 03/2022 and we sent him for mask refitting due to high leak and uncontrolled AHI. He reported receiving new supplies but did not have a mask refitting. Repeat eval of compliance data showed continued leak 05/2022 so we had him call [employer] to request mask refitting. Since,     04/08/2022 ALL: Aarush returns for follow up for OSA on CPAP. He reports doing well on therapy but states that he continues to have difficulty getting new supplies. He was able to get a new nasal mask but did not tolerate and has returned to nasal pillow. He reports not receiving new supplies since last visit 11/2021. He is unable to tighten headgear as it is "worn out." He is sleeping fairly well but does report over the past few months he is waking up around 2-3am and unable to return to sleep. He is followed closely by cardiology, pulmonology and PCP.     12/11/2021 ALL: Leonette Most returns for follow up for OSA on CPAP. He reports more difficulty tolerating CPAP over the past couple of months. He had three cardiac stents placed after our visit last year, 11/2020. Since, he has had difficulty with worsening shob. He feels that he has more trouble breathing at times when using CPAP. He admits that compliance has fallen off. He is currently using a nasal pillow and leak has worsening, significantly. We have always had higher leak, probably due to mask type and facial hair. AHI has remained well managed, however, it is elevated over the last 30 days to 13.4. He states that he has not received his new supplies for this month. He is anticipating pulmonary function testing soon. He has stopped all inhalers. He is hoping to have GI workup for possible GERD but cannot have scope now as he is on Brilenta.      12/11/2020 ALL: Calib returns for follow up for OSA on CPAP. He is doing well on therapy. He denies concerns with machine or supplies. Facial hair continues to contribute to large air leak. AHI is well managed and he is sleeping well.   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 Cardiology was unable to place stents. He also has left subclavian occlusion, followed by Dr Edilia Bo, vascular. He has planned orbital atherectomy and stenting of RCA on 12/21/2020. He continues asa and Brilinta. He was referred to PharmD for lipid management.     12/08/2019 ALL: William [name]. is a 82 y.o. male here today for follow up for OSA on CPAP.  He continues to do well with CPAP therapy.  He does note significant improvement in daytime energy.  He continues to note a leak on his mask.  He feels that nasal pillows are the best fit.  Facial hair is most likely contributing to an adequate mask seal.  Otherwise he is doing very well.  Compliance report dated 11/07/2019 through 12/06/2019 reveals that he used CPAP 29 of the past 30 days for compliance of 97%.  He used CPAP greater than 4 hours 28 of the past 30 days for compliance of 93%.  Average usage on days used was 8 hours and  25 minutes.  Residual AHI was 2.6 on a set pressure of 10 cm of water.  Leak continues to be elevated in the 95th percentile of 53.2 L/min.  HISTORY: (copied from my note on 03/21/2019)  William [name]. is a 82 y.o. male here today for follow up for OSA on CPAP. He reports that he is doing very well with CPAP therapy. He is using CPAP nightly. He does have a full mustache and beard. He feels that this is why he continues to note a leak. He feels that he has his mask and headgear as tight as he can tolerate.  He does note that he wakes up most mornings around 3:57 AM.  He states that he can sit on the side of the bed for a few minutes  and drifts right back off to sleep.  He denies any concerns of nocturia.  He has no trouble with insomnia.  He feels well rested when he wakes.   Compliance report dated 02/15/2019 through 03/16/2019 reveals that he has used CPAP 30 out of the last 30 days for compliance of 100%.  29 of the last 30 days he used CPAP greater than 4 hours for compliance of 97%.  Average usage was 7 hours and 23 minutes.  Residual AHI was 1.6 on a set pressure of 10 cm of water and an EPR of 1 leak noted in the 95th percentile of 43.1.     HISTORY: (copied from Dr Teofilo Pod note on 07/13/2018)   Mr. Sivils is a 82 year old right-handed gentleman with an underlying complex medical history of peripheral vascular disease with status post iliac stenting, coronary artery disease with status post CABG in 2017, hypertension, vitamin D deficiency, COPD, history of non-STEMI, hyperlipidemia, carotid artery disease, and overweight state, with whom I am conducting a virtual, phone based follow-up appointment today in lieu of a face-to-face visit for follow-up consultation of his obstructive sleep apnea after interim sleep study testing and starting CPAP therapy. The patient is unaccompanied today and joins from home. I first met him on 02/23/2018 at the request of his primary care physician, Dr. Karilyn Cota, at which time he reported snoring and excessive daytime somnolence. He was advised to proceed with sleep study testing. I went over his test results with him today. He had a baseline sleep study on 04/05/2018 which showed a sleep latency delayed, REM latency 56 minutes, sleep efficiency reduced at 50.6%, he had absence of slow-wave sleep and REM sleep was 17.8%, total AHI 24.8 per hour, supine AHI 60.4 per hour and REM AHI 52.5 per hour. Average oxygen saturation 92%, nadir of 81%. He had severe PLMS with mild arousals. He was invited for a CPAP titration study. This was on 04/21/2018. Sleep efficiency 57.6%, sleep latency delayed, REM latency  140 minutes. He had near absence of slow-wave sleep and REM sleep was normal at 22.7%. CPAP was titrated from 5 cm to 9 cm. On the final pressure his AHI was 0.5 per hour, O2 nadir of 89% with supine REM sleep achieved. Based on his test results I prescribed CPAP therapy for home use at a pressure of 10 cm.   He also had an interim brain MRI in December 2019 because of his report of intermittent diplopia. He had the study in [place], [place], brain MRI with and without contrast showed periventricular white matter changes, no abnormal enhancement.   Today, 07/13/2018: Please also see below for virtual visit documentation.   I reviewed his CPAP compliance  data from 06/12/2018 through 07/11/2018 which is a total of 30 days, during which time he used his machine every night with percent used days greater than 4 hours at 90%, indicating excellent compliance with an average usage of 5 hours and 39 minutes, residual AHI at goal at 2.3 per hour, leak high with the 95th percentile at 42.2 L/m on a pressure of 10 cm.   Previously:   02/23/2018: (He) reports snoring and excessive daytime somnolence. I reviewed your office note from 01/05/2018, when he saw Jiles Prows, nurse practitioner. His Epworth sleepiness score is 8 out of 24 today, fatigue score is 32 out of 63. He is widowed and lives alone, he has 2 children. 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 He drinks caffeine in the form of coffee, 2 per day on average. He had a BMP checked at your office on 01/19/2018 and I reviewed the results: BUN was 26, creatinine 0.94. He is trying to hydrate better. He reports a history of double vision for the past 5 years, symptoms have been infrequent, very brief, less than a minute at a time but have increased in frequency, maybe averaging once a month whereas he may have  had double vision once every few months in the past. He has seen ophthalmology for this. He denies any one-sided weakness or numbness or stroke like symptoms in the past. He is followed by cardiology. He was encouraged to consider a sleep study by his cardiologist as well in the past. He has never had a sleep study. He does snore loudly. He is not aware of any family history of OSA. His bedtime is around 8:30 and rise time around 3:30. He has nocturia about twice per average night and denies morning headaches.   REVIEW OF SYSTEMS: Out of a complete 14 system review of symptoms, the patient complains only of the following symptoms, shortness of breath, chest pain, sleep maintenance difficulty  and all other reviewed systems are negative.  ESS: 2/24   ALLERGIES: No Known Allergies  HOME MEDICATIONS: Outpatient Medications Prior to Visit  Medication Sig Dispense Refill   amLODipine (NORVASC) 5 MG tablet Take 2 tablets (10 mg total) by mouth daily. (Patient taking differently: Take 5 mg by mouth 2 (two) times daily.) 90 tablet 3   Ascorbic Acid (VITAMIN C) 1000 MG tablet Take 1,000 mg by mouth in the morning.     aspirin EC 81 MG tablet Take 1 tablet (81 mg total) by mouth daily.     budesonide-formoterol (SYMBICORT) 80-4.5 MCG/ACT inhaler Take 2 puffs first thing in am and then another 2 puffs about 12 hours later. 1 each 11   cholecalciferol (VITAMIN D) 25 MCG (1000 UNIT) tablet Take 1,000 Units by mouth in the morning.     ezetimibe (ZETIA) 10 MG tablet TAKE 1 TABLET BY MOUTH EVERY DAY 90 tablet 3   famotidine (PEPCID) 20 MG tablet Take 20 mg by mouth every evening.     fish oil-omega-3 fatty acids 1000 MG capsule Take 1,000 mg by mouth in the morning.     folic acid (FOLVITE) 400 MCG tablet Take 400 mcg by mouth in the morning.     Garlic 2000 MG TBEC Take 2,000 mg by mouth in the morning.     isosorbide mononitrate (IMDUR) 60 MG  24 hr tablet TAKE 1 TABLET BY MOUTH EVERY DAY 90 tablet 1    metoprolol tartrate (LOPRESSOR) 50 MG tablet TAKE 1 TABLET BY MOUTH TWICE A DAY 180 tablet 1   Multiple Vitamin (MULTIVITAMIN WITH MINERALS) TABS tablet Take 1 tablet by mouth in the morning.     nitroGLYCERIN (NITROSTAT) 0.4 MG SL tablet Place 1 tablet (0.4 mg total) under the tongue every 5 (five) minutes as needed for chest pain. If pain does not resolve with rest. 50 tablet 3   NP THYROID 30 MG tablet TAKE 1 TABLET (30 MG TOTAL) BY MOUTH DAILY BEFORE BREAKFAST 90 tablet 1   omeprazole (PRILOSEC OTC) 20 MG tablet Take 1 tablet (20 mg total) by mouth daily. Take 30-60 min before first meal of the day     rosuvastatin (CRESTOR) 10 MG tablet TAKE 1 TABLET BY MOUTH EVERY DAY 90 tablet 3   sodium bicarbonate 650 MG tablet Take 650 mg by mouth 2 (two) times daily.     thiamine (VITAMIN B-1) 100 MG tablet Take 100 mg by mouth every evening.      ticagrelor (BRILINTA) 60 MG TABS tablet Take 1 tablet (60 mg total) by mouth 2 (two) times daily. 60 tablet 6   traMADol (ULTRAM) 50 MG tablet Take 1 tablet (50 mg total) by mouth every 6 (six) hours as needed. 16 tablet 0   valsartan (DIOVAN) 160 MG tablet TAKE 1 TABLET BY MOUTH EVERY DAY 90 tablet 1   No facility-administered medications prior to visit.    PAST MEDICAL HISTORY: Past Medical History:  Diagnosis Date   Carotid artery disease (HCC)    Colon polyp    COPD (chronic obstructive pulmonary disease) (HCC)    Coronary artery disease    Multivessel status post CABG with LIMA to LAD and SVG to OM1 2017   Hyperlipidemia    Hypertension    Iliac artery aneurysm (HCC)    Myocardial infarction (HCC)    Peripheral arterial disease (HCC)    Suboptimal revascularization options    PAST SURGICAL HISTORY: Past Surgical History:  Procedure Laterality Date   ABDOMINAL AORTOGRAM W/LOWER EXTREMITY N/A 08/21/2017   Procedure: ABDOMINAL AORTOGRAM W/LOWER EXTREMITY;  Surgeon: Chuck Hint, MD;  Location: [facility] INVASIVE CV LAB;  Service:  Cardiovascular;  Laterality: N/A;   ABDOMINAL AORTOGRAM W/LOWER EXTREMITY N/A 04/25/2022   Procedure: ABDOMINAL AORTOGRAM W/LOWER EXTREMITY;  Surgeon: Chuck Hint, MD;  Location: [facility] INVASIVE CV LAB;  Service: Cardiovascular;  Laterality: N/A;   Bilateral renal  artery stenoses  04/23/2009   CARDIAC CATHETERIZATION N/A 05/21/2015   Procedure: Left Heart Cath and Coronary Angiography;  Surgeon: Runell Gess, MD;  Location: [facility] INVASIVE CV LAB;  Service: Cardiovascular;  Laterality: N/A;   CORONARY ARTERY BYPASS GRAFT N/A 05/22/2015   Procedure: CORONARY ARTERY BYPASS GRAFTING (CABG) LIMA-LAD and SVG-OM EVH RIGHT THIGH GREATER SAPHENOUS VEIN;  Surgeon: Delight Ovens, MD;  Location: [facility] OR;  Service: Open Heart Surgery;  Laterality: N/A;   CORONARY ATHERECTOMY N/A 12/21/2020   Procedure: CORONARY ATHERECTOMY;  Surgeon: Kathleene Hazel, MD;  Location: [facility] INVASIVE CV LAB;  Service: Cardiovascular;  Laterality: N/A;   CORONARY BALLOON ANGIOPLASTY N/A 12/06/2020   Procedure: CORONARY BALLOON ANGIOPLASTY;  Surgeon: Kathleene Hazel, MD;  Location: [facility] INVASIVE CV LAB;  Service: Cardiovascular;  Laterality: N/A;   CORONARY STENT INTERVENTION N/A 12/21/2020   Procedure: CORONARY STENT INTERVENTION;  Surgeon: Kathleene Hazel, MD;  Location: [facility] INVASIVE CV LAB;  Service: Cardiovascular;  Laterality: N/A;   CORONARY ULTRASOUND/IVUS N/A 12/21/2020   Procedure: Intravascular Ultrasound/IVUS;  Surgeon: Kathleene Hazel, MD;  Location: [facility] INVASIVE CV LAB;  Service: Cardiovascular;  Laterality: N/A;   HIATAL HERNIA REPAIR     LEFT HEART CATH AND CORS/GRAFTS ANGIOGRAPHY N/A 12/06/2020   Procedure: LEFT HEART CATH AND CORS/GRAFTS ANGIOGRAPHY;  Surgeon: Kathleene Hazel, MD;  Location: [facility] INVASIVE CV LAB;  Service: Cardiovascular;  Laterality: N/A;   Percutaneous translumnial angioplasty  04/23/2009   Catheterization of Lefst external iliac artery with Left lower extremity  runoff   TEE WITHOUT CARDIOVERSION N/A 05/22/2015   Procedure: TRANSESOPHAGEAL ECHOCARDIOGRAM (TEE);  Surgeon: Delight Ovens, MD;  Location: [facility] OR;  Service: Open Heart Surgery;  Laterality: N/A;    FAMILY HISTORY: Family History  Problem Relation Age of Onset   Heart disease Father        Heart Disease before age 66   Pulmonary embolism Father    Deep vein thrombosis Father    Cancer Mother        Sarcoma    SOCIAL HISTORY: Social History   Socioeconomic History   Marital status: Married    Spouse name: Not on file   Number of children: Not on file   Years of education: Not on file   Highest education level: Not on file  Occupational History   Not on file  Tobacco Use   Smoking status: Former    Current packs/day: 0.00    Average packs/day: 2.0 packs/day for 30.5 years (60.9 ttl pk-yrs)    Types: Cigarettes    Start date: 06/03/1961    Quit date: 11/17/1991    Years since quitting: 30.9    Passive exposure: Never   Smokeless tobacco: Former    Types: Snuff, Chew  Vaping Use   Vaping status: Never Used  Substance and Sexual Activity   Alcohol use: Not Currently    Alcohol/week: 2.0 - 4.0 standard drinks of alcohol    Types: 2 - 4 Standard drinks or equivalent per week    Comment: used to, quit 2012   Drug use: No   Sexual activity: Not Currently  Other Topics Concern   Not on file  Social History Narrative   Retired used to work in [employer]. Widower. Lives alone.   Social Determinants of Health   Financial Resource Strain: Low Risk  (12/30/2021)   Overall Financial Resource Strain (CARDIA)    Difficulty of Paying Living Expenses: Not hard at all  Food Insecurity: No Food Insecurity (06/03/2022)   Hunger Vital Sign    Worried About Running Out of Food in the Last Year: Never true    Ran Out of Food in the Last Year: Never true  Transportation Needs: No Transportation Needs (06/03/2022)   PRAPARE - Administrator, Civil Service (Medical): No     Lack of Transportation (Non-Medical): No  Physical Activity: Insufficiently Active (01/27/2022)   Exercise Vital Sign    Days of Exercise per Week: 3 days    Minutes of Exercise per Session: 10 min  Stress: No Stress Concern Present (12/30/2021)   [employer] of Occupational Health - Occupational Stress Questionnaire    Feeling of Stress : Not at all  Social Connections: Moderately Integrated (12/30/2021)   Social Connection and Isolation Panel [NHANES]    Frequency of Communication with Friends and Family: More than three times a week    Frequency of Social Gatherings with Friends and Family:  More than three times a week    Attends Religious Services: More than 4 times per year    Active Member of Clubs or Organizations: Yes    Attends Banker Meetings: More than 4 times per year    Marital Status: Widowed  Intimate Partner Violence: Not At Risk (06/03/2022)   Humiliation, Afraid, Rape, and Kick questionnaire    Fear of Current or Ex-Partner: No    Emotionally Abused: No    Physically Abused: No    Sexually Abused: No      PHYSICAL EXAM  There were no vitals filed for this visit.    There is no height or weight on file to calculate BMI.  Generalized: Well developed, in no acute distress  Cardiology: normal rate and rhythm, no murmur noted Respiratory: clear to auscultation bilaterally  Neurological examination  Mentation: Alert oriented to time, place, history taking. Follows all commands speech and language fluent Cranial nerve II-XII: Pupils were equal round reactive to light. Extraocular movements were full, visual field were full  Motor: The motor testing reveals 5 over 5 strength of all 4 extremities. Good symmetric motor tone is noted throughout.  Gait and station: Gait is normal.    DIAGNOSTIC DATA (LABS, IMAGING, TESTING) - I reviewed patient records, labs, notes, testing and imaging myself where available.      No data to display            Lab Results  Component Value Date   WBC 8.9 11/14/2021   HGB 12.9 (L) 04/25/2022   HCT 38.0 (L) 04/25/2022   MCV 94 11/14/2021   PLT 320 11/14/2021      Component Value Date/Time   NA 138 04/25/2022 0849   NA 140 11/14/2021 1353   K 5.1 04/25/2022 0849   CL 106 04/25/2022 0849   CO2 25 02/27/2022 1105   GLUCOSE 96 04/25/2022 0849   BUN 41 (H) 04/25/2022 0849   BUN 25 11/14/2021 1353   CREATININE 2.20 (H) 04/25/2022 0849   CREATININE 1.06 07/04/2020 1018   CALCIUM 9.7 06/03/2022 1016   PROT 7.3 02/27/2022 1105   ALBUMIN 4.4 02/27/2022 1105   AST 23 02/27/2022 1105   ALT 18 02/27/2022 1105   ALKPHOS 50 02/27/2022 1105   BILITOT 0.5 02/27/2022 1105   GFRNONAA 52 (L) 05/31/2021 1218   GFRNONAA 66 07/04/2020 1018   GFRAA 77 07/04/2020 1018   Lab Results  Component Value Date   CHOL 101 02/27/2022   HDL 41.40 02/27/2022   LDLCALC 43 02/27/2022   TRIG 84.0 02/27/2022   CHOLHDL 2 02/27/2022   Lab Results  Component Value Date   HGBA1C 6.0 (H) 05/21/2015   No results found for: "VITAMINB12" Lab Results  Component Value Date   TSH 1.17 02/27/2022     ASSESSMENT AND PLAN 82 y.o. year old male  has a past medical history of Carotid artery disease (HCC), Colon polyp, COPD (chronic obstructive pulmonary disease) (HCC), Coronary artery disease, Hyperlipidemia, Hypertension, Iliac artery aneurysm (HCC), Myocardial infarction (HCC), and Peripheral arterial disease (HCC). here with   No diagnosis found.   Scottie has had more difficulty with shortness of breath post cardiac stenting 11/2020. He is followed closely by pulmonology. Compliance report reveals excellent daily and optimal 4 hour usage. Leak has increased and AHI is no longer well managed. Now AHI 23/h. He did not tolerate nasal mask and prefers nasal pillow. I will ask [employer] to ensure proper size of nasal pillow and update  supplies. He was encouraged to continue monitoring for correct mask seal at home. I will  recheck download in 4-6 weeks. If AHI remains elevated, may consider titration study. He was encouraged to continue healthy lifestyle habits.  He will continue close follow-up with pulmonology, cardiology, vascular and primary care as directed.  He will follow-up with me in 4 months, sooner if needed.  He verbalizes understanding and agreement with this plan.  No orders of the defined types were placed in this encounter.     No orders of the defined types were placed in this encounter.     Shawnie Dapper, FNP-C 10/16/2022, 3:18 PM [facility] [address] [place], [place] [address] [phone]

## 2022-10-20 ENCOUNTER — Ambulatory Visit (INDEPENDENT_AMBULATORY_CARE_PROVIDER_SITE_OTHER): Payer: Medicare Other | Admitting: Family Medicine

## 2022-10-20 ENCOUNTER — Encounter: Payer: Self-pay | Admitting: Family Medicine

## 2022-10-20 VITALS — BP 160/49 | HR 67 | Ht 66.0 in | Wt 166.0 lb

## 2022-10-20 DIAGNOSIS — G4733 Obstructive sleep apnea (adult) (pediatric): Secondary | ICD-10-CM | POA: Diagnosis not present

## 2022-10-28 ENCOUNTER — Inpatient Hospital Stay: Payer: Medicare Other | Attending: Hematology

## 2022-10-28 DIAGNOSIS — Z7982 Long term (current) use of aspirin: Secondary | ICD-10-CM | POA: Insufficient documentation

## 2022-10-28 DIAGNOSIS — Z7902 Long term (current) use of antithrombotics/antiplatelets: Secondary | ICD-10-CM | POA: Diagnosis not present

## 2022-10-28 DIAGNOSIS — D472 Monoclonal gammopathy: Secondary | ICD-10-CM | POA: Diagnosis not present

## 2022-10-28 DIAGNOSIS — N1832 Chronic kidney disease, stage 3b: Secondary | ICD-10-CM | POA: Insufficient documentation

## 2022-10-28 DIAGNOSIS — Z79899 Other long term (current) drug therapy: Secondary | ICD-10-CM | POA: Diagnosis not present

## 2022-10-28 DIAGNOSIS — E611 Iron deficiency: Secondary | ICD-10-CM

## 2022-10-28 DIAGNOSIS — Z87891 Personal history of nicotine dependence: Secondary | ICD-10-CM | POA: Diagnosis not present

## 2022-10-28 DIAGNOSIS — I129 Hypertensive chronic kidney disease with stage 1 through stage 4 chronic kidney disease, or unspecified chronic kidney disease: Secondary | ICD-10-CM | POA: Insufficient documentation

## 2022-10-28 DIAGNOSIS — Z7951 Long term (current) use of inhaled steroids: Secondary | ICD-10-CM | POA: Insufficient documentation

## 2022-10-28 LAB — COMPREHENSIVE METABOLIC PANEL
ALT: 24 U/L (ref 0–44)
AST: 30 U/L (ref 15–41)
Albumin: 3.3 g/dL — ABNORMAL LOW (ref 3.5–5.0)
Alkaline Phosphatase: 46 U/L (ref 38–126)
Anion gap: 5 (ref 5–15)
BUN: 29 mg/dL — ABNORMAL HIGH (ref 8–23)
CO2: 20 mmol/L — ABNORMAL LOW (ref 22–32)
Calcium: 8.7 mg/dL — ABNORMAL LOW (ref 8.9–10.3)
Chloride: 112 mmol/L — ABNORMAL HIGH (ref 98–111)
Creatinine, Ser: 1.35 mg/dL — ABNORMAL HIGH (ref 0.61–1.24)
GFR, Estimated: 53 mL/min — ABNORMAL LOW (ref >60)
Glucose, Bld: 98 mg/dL (ref 70–99)
Potassium: 4 mmol/L (ref 3.5–5.1)
Sodium: 137 mmol/L (ref 135–145)
Total Bilirubin: 0.5 mg/dL (ref 0.3–1.2)
Total Protein: 6.6 g/dL (ref 6.5–8.1)

## 2022-10-28 LAB — CBC WITH DIFFERENTIAL/PLATELET
Abs Immature Granulocytes: 0.02 10*3/uL (ref 0.00–0.07)
Basophils Absolute: 0 10*3/uL (ref 0.0–0.1)
Basophils Relative: 1 %
Eosinophils Absolute: 0.5 10*3/uL (ref 0.0–0.5)
Eosinophils Relative: 7 %
HCT: 38.2 % — ABNORMAL LOW (ref 39.0–52.0)
Hemoglobin: 12.7 g/dL — ABNORMAL LOW (ref 13.0–17.0)
Immature Granulocytes: 0 %
Lymphocytes Relative: 17 %
Lymphs Abs: 1.3 10*3/uL (ref 0.7–4.0)
MCH: 31.4 pg (ref 26.0–34.0)
MCHC: 33.2 g/dL (ref 30.0–36.0)
MCV: 94.6 fL (ref 80.0–100.0)
Monocytes Absolute: 0.9 10*3/uL (ref 0.1–1.0)
Monocytes Relative: 11 %
Neutro Abs: 5.1 10*3/uL (ref 1.7–7.7)
Neutrophils Relative %: 64 %
Platelets: 289 10*3/uL (ref 150–400)
RBC: 4.04 MIL/uL — ABNORMAL LOW (ref 4.22–5.81)
RDW: 14.6 % (ref 11.5–15.5)
WBC: 7.9 10*3/uL (ref 4.0–10.5)
nRBC: 0 % (ref 0.0–0.2)

## 2022-10-28 LAB — IRON AND TIBC
Iron: 77 ug/dL (ref 45–182)
Saturation Ratios: 23 % (ref 17.9–39.5)
TIBC: 337 ug/dL (ref 250–450)
UIBC: 260 ug/dL

## 2022-10-28 LAB — FERRITIN: Ferritin: 46 ng/mL (ref 24–336)

## 2022-10-31 ENCOUNTER — Ambulatory Visit (HOSPITAL_COMMUNITY)
Admission: RE | Admit: 2022-10-31 | Discharge: 2022-10-31 | Disposition: A | Discharge status: Home / Self Care | Payer: Medicare Other | Source: Ambulatory Visit | Attending: Nurse Practitioner | Admitting: Nurse Practitioner

## 2022-10-31 DIAGNOSIS — R0609 Other forms of dyspnea: Secondary | ICD-10-CM

## 2022-10-31 DIAGNOSIS — I2510 Atherosclerotic heart disease of native coronary artery without angina pectoris: Secondary | ICD-10-CM | POA: Diagnosis not present

## 2022-10-31 LAB — ECHOCARDIOGRAM COMPLETE
AR max vel: 2.37 cm2
AV Area VTI: 2.39 cm2
AV Area mean vel: 2.36 cm2
AV Mean grad: 3 mmHg
AV Peak grad: 4.8 mmHg
Ao pk vel: 1.1 m/s
Area-P 1/2: 3.97 cm2
P 1/2 time: 720 msec
S' Lateral: 3.2 cm

## 2022-11-03 NOTE — Progress Notes (Signed)
[facility] [address] [place] [address]   Clinic Day:  11/04/2022  Referring physician: [name], NP  Patient Care Team: [name], NP as PCP - General (Nurse Practitioner) Jonelle Sidle, MD as PCP - Cardiology (Cardiology) Nyoka Cowden, MD as Consulting Physician (Pulmonary Disease) Arsenio Loader, [facility] as Consulting Physician (Optometry)   ASSESSMENT & PLAN:   Assessment:  1.  Monoclonal gammopathy: - Labs done by Dr. Ronalee Belts on 05/08/2022, SPEP showed 0.3 g of M spike. 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 - Creatinine 1.3, calcium 10.4 mildly elevated. - Immunofixation: IgM kappa.  Kappa light chains 47.4, ratio 1.81.  Beta-2 microglobulin 2.9.  LDH 148. The skeletal survey (06/03/2022): No evidence of lytic lesions.  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  2.  Social/family history: - Lives by himself and is independent of ADLs and IADLs.  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  Smoked 2 packs of cigarettes per day for 20 years and quit smoking 20 years ago. - Mother had metastatic sarcoma.  Sister had lung cancer.  Plan:  1.  IgM kappa monoclonal gammopathy: - He does not have any B symptoms.  No new pains reported. - Reviewed labs from 10/28/2022: Creatinine 1.35 at baseline.  Calcium normal at 8.7.  M spike is stable at 0.3 g.  Hemoglobin 12.7.  FLC ratio is normal at 1.5.  Kappa light chains are improved at 41 from 47. - Differential diagnosis includes lymphoplasmacytic lymphoma.  No "crab" features at this time.  No indication for further workup or treatment.  Recommend follow-up in 4 months with repeat labs.  If it remains stable at next visit, will consider switching to every 6 months for follow-up.  2.  Mild hypercalcemia: - Continue vitamin D 1000 units daily.  Calcium normalized at 8.7 today.   Orders Placed  This Encounter  Procedures   CBC with Differential    Standing Status:   Future    Standing Expiration Date:   11/04/2023   Comprehensive metabolic panel    Standing Status:   Future    Standing Expiration Date:   11/04/2023   Kappa/lambda light chains    Standing Status:   Future    Standing Expiration Date:   11/04/2023   Protein electrophoresis, serum    Standing Status:   Future    Standing Expiration Date:   11/04/2023   Lactate dehydrogenase    Standing Status:   Future    Standing Expiration Date:   11/04/2023     Mikeal Hawthorne R Teague,acting as a scribe for Doreatha Massed, MD.,have documented all relevant documentation on the behalf of Doreatha Massed, MD,as directed by  Doreatha Massed, MD while in the presence of Doreatha Massed, MD.  I, Doreatha Massed MD, have reviewed the above documentation for accuracy and completeness, and I agree with the above.     Doreatha Massed, MD   8/6/20241:56 PM  CHIEF COMPLAINT/PURPOSE OF CONSULT:   Diagnosis: Monoclonal gammopathy  Current Therapy: Observation  INTERVAL HISTORY:   William [name] is a 82 y.o. male presenting to clinic today for follow up of Monoclonal (M) protein disease, multiple 'M' protein. He was last seen by me on 06/24/22.  His most recent protein electrophoresis panel on 10/28/22 found elevated M spike protein at 0.3. Kappa free light chain was elevated at 41.0 and  lambda free light chains was elevated at 27.4 on 10/28/22.   Today, he states that he is doing well overall. His appetite level is at 100%. His energy level is at 10%.   PAST MEDICAL HISTORY:   Past Medical History: Past Medical History:  Diagnosis Date   Carotid artery disease (HCC)    Colon polyp    COPD (chronic obstructive pulmonary disease) (HCC)    Coronary artery disease    Multivessel status post CABG with LIMA to LAD and SVG to OM1 2017   Hyperlipidemia    Hypertension    Iliac artery aneurysm (HCC)    Myocardial  infarction (HCC)    Peripheral arterial disease (HCC)    Suboptimal revascularization options    Surgical History: Past Surgical History:  Procedure Laterality Date   ABDOMINAL AORTOGRAM W/LOWER EXTREMITY N/A 08/21/2017   Procedure: ABDOMINAL AORTOGRAM W/LOWER EXTREMITY;  Surgeon: Chuck Hint, MD;  Location: [facility] INVASIVE CV LAB;  Service: Cardiovascular;  Laterality: N/A;   ABDOMINAL AORTOGRAM W/LOWER EXTREMITY N/A 04/25/2022   Procedure: ABDOMINAL AORTOGRAM W/LOWER EXTREMITY;  Surgeon: Chuck Hint, MD;  Location: [facility] INVASIVE CV LAB;  Service: Cardiovascular;  Laterality: N/A;   Bilateral renal  artery stenoses  04/23/2009   CARDIAC CATHETERIZATION N/A 05/21/2015   Procedure: Left Heart Cath and Coronary Angiography;  Surgeon: Runell Gess, MD;  Location: [facility] INVASIVE CV LAB;  Service: Cardiovascular;  Laterality: N/A;   CORONARY ARTERY BYPASS GRAFT N/A 05/22/2015   Procedure: CORONARY ARTERY BYPASS GRAFTING (CABG) LIMA-LAD and SVG-OM EVH RIGHT THIGH GREATER SAPHENOUS VEIN;  Surgeon: Delight Ovens, MD;  Location: [facility] OR;  Service: Open Heart Surgery;  Laterality: N/A;   CORONARY ATHERECTOMY N/A 12/21/2020   Procedure: CORONARY ATHERECTOMY;  Surgeon: Kathleene Hazel, MD;  Location: [facility] INVASIVE CV LAB;  Service: Cardiovascular;  Laterality: N/A;   CORONARY BALLOON ANGIOPLASTY N/A 12/06/2020   Procedure: CORONARY BALLOON ANGIOPLASTY;  Surgeon: Kathleene Hazel, MD;  Location: [facility] INVASIVE CV LAB;  Service: Cardiovascular;  Laterality: N/A;   CORONARY STENT INTERVENTION N/A 12/21/2020   Procedure: CORONARY STENT INTERVENTION;  Surgeon: Kathleene Hazel, MD;  Location: [facility] INVASIVE CV LAB;  Service: Cardiovascular;  Laterality: N/A;   CORONARY ULTRASOUND/IVUS N/A 12/21/2020   Procedure: Intravascular Ultrasound/IVUS;  Surgeon: Kathleene Hazel, MD;  Location: [facility] INVASIVE CV LAB;  Service: Cardiovascular;  Laterality: N/A;   HIATAL HERNIA REPAIR      LEFT HEART CATH AND CORS/GRAFTS ANGIOGRAPHY N/A 12/06/2020   Procedure: LEFT HEART CATH AND CORS/GRAFTS ANGIOGRAPHY;  Surgeon: Kathleene Hazel, MD;  Location: [facility] INVASIVE CV LAB;  Service: Cardiovascular;  Laterality: N/A;   Percutaneous translumnial angioplasty  04/23/2009   Catheterization of Lefst external iliac artery with Left lower extremity runoff   TEE WITHOUT CARDIOVERSION N/A 05/22/2015   Procedure: TRANSESOPHAGEAL ECHOCARDIOGRAM (TEE);  Surgeon: Delight Ovens, MD;  Location: [facility] OR;  Service: Open Heart Surgery;  Laterality: N/A;    Social History: Social History   Socioeconomic History   Marital status: Married    Spouse name: Not on file   Number of children: Not on file   Years of education: Not on file   Highest education level: Not on file  Occupational History   Not on file  Tobacco Use   Smoking status: Former    Current packs/day: 0.00    Average packs/day: 2.0 packs/day for 30.5 years (60.9 ttl pk-yrs)    Types: Cigarettes    Start date: 06/03/1961  Quit date: 11/17/1991    Years since quitting: 30.9    Passive exposure: Never   Smokeless tobacco: Former    Types: Snuff, Chew  Vaping Use   Vaping status: Never Used  Substance and Sexual Activity   Alcohol use: Not Currently    Alcohol/week: 2.0 - 4.0 standard drinks of alcohol    Types: 2 - 4 Standard drinks or equivalent per week    Comment: used to, quit 2012   Drug use: No   Sexual activity: Not Currently  Other Topics Concern   Not on file  Social History Narrative   Retired used to work in [employer]. Widower. Lives alone.   Social Determinants of Health   Financial Resource Strain: Low Risk  (12/30/2021)   Overall Financial Resource Strain (CARDIA)    Difficulty of Paying Living Expenses: Not hard at all  Food Insecurity: No Food Insecurity (06/03/2022)   Hunger Vital Sign    Worried About Running Out of Food in the Last Year: Never true    Ran Out of Food in the Last Year: Never  true  Transportation Needs: No Transportation Needs (06/03/2022)   PRAPARE - Administrator, Civil Service (Medical): No    Lack of Transportation (Non-Medical): No  Physical Activity: Insufficiently Active (01/27/2022)   Exercise Vital Sign    Days of Exercise per Week: 3 days    Minutes of Exercise per Session: 10 min  Stress: No Stress Concern Present (12/30/2021)   [employer] of Occupational Health - Occupational Stress Questionnaire    Feeling of Stress : Not at all  Social Connections: Moderately Integrated (12/30/2021)   Social Connection and Isolation Panel [NHANES]    Frequency of Communication with Friends and Family: More than three times a week    Frequency of Social Gatherings with Friends and Family: More than three times a week    Attends Religious Services: More than 4 times per year    Active Member of [employer] or Organizations: Yes    Attends Banker Meetings: More than 4 times per year    Marital Status: Widowed  Intimate Partner Violence: Not At Risk (06/03/2022)   Humiliation, Afraid, Rape, and Kick questionnaire    Fear of Current or Ex-Partner: No    Emotionally Abused: No    Physically Abused: No    Sexually Abused: No    Family History: Family History  Problem Relation Age of Onset   Heart disease Father        Heart Disease before age 54   Pulmonary embolism Father    Deep vein thrombosis Father    Cancer Mother        Sarcoma    Current Medications:  Current Outpatient Medications:    amLODipine (NORVASC) 5 MG tablet, Take 2 tablets (10 mg total) by mouth daily. (Patient taking differently: Take 5 mg by mouth 2 (two) times daily.), Disp: 90 tablet, Rfl: 3   Ascorbic Acid (VITAMIN C) 1000 MG tablet, Take 1,000 mg by mouth in the morning., Disp: , Rfl:    aspirin EC 81 MG tablet, Take 1 tablet (81 mg total) by mouth daily., Disp: , Rfl:    budesonide-formoterol (SYMBICORT) 80-4.5 MCG/ACT inhaler, Take 2 puffs first thing in  am and then another 2 puffs about 12 hours later., Disp: 1 each, Rfl: 11   cholecalciferol (VITAMIN D) 25 MCG (1000 UNIT) tablet, Take 1,000 Units by mouth in the morning., Disp: , Rfl:  ezetimibe (ZETIA) 10 MG tablet, TAKE 1 TABLET BY MOUTH EVERY DAY, Disp: 90 tablet, Rfl: 3   famotidine (PEPCID) 20 MG tablet, Take 20 mg by mouth every evening., Disp: , Rfl:    fish oil-omega-3 fatty acids 1000 MG capsule, Take 1,000 mg by mouth in the morning., Disp: , Rfl:    folic acid (FOLVITE) 400 MCG tablet, Take 400 mcg by mouth in the morning., Disp: , Rfl:    Garlic 2000 MG TBEC, Take 2,000 mg by mouth in the morning., Disp: , Rfl:    isosorbide mononitrate (IMDUR) 60 MG 24 hr tablet, TAKE 1 TABLET BY MOUTH EVERY DAY, Disp: 90 tablet, Rfl: 1   metoprolol tartrate (LOPRESSOR) 50 MG tablet, TAKE 1 TABLET BY MOUTH TWICE A DAY, Disp: 180 tablet, Rfl: 1   Multiple Vitamin (MULTIVITAMIN WITH MINERALS) TABS tablet, Take 1 tablet by mouth in the morning., Disp: , Rfl:    NP THYROID 30 MG tablet, TAKE 1 TABLET (30 MG TOTAL) BY MOUTH DAILY BEFORE BREAKFAST, Disp: 90 tablet, Rfl: 1   omeprazole (PRILOSEC OTC) 20 MG tablet, Take 1 tablet (20 mg total) by mouth daily. Take 30-60 min before first meal of the day, Disp: , Rfl:    rosuvastatin (CRESTOR) 10 MG tablet, TAKE 1 TABLET BY MOUTH EVERY DAY, Disp: 90 tablet, Rfl: 3   sodium bicarbonate 650 MG tablet, Take 650 mg by mouth 2 (two) times daily., Disp: , Rfl:    thiamine (VITAMIN B-1) 100 MG tablet, Take 100 mg by mouth every evening. , Disp: , Rfl:    ticagrelor (BRILINTA) 60 MG TABS tablet, Take 1 tablet (60 mg total) by mouth 2 (two) times daily., Disp: 60 tablet, Rfl: 6   traMADol (ULTRAM) 50 MG tablet, Take 1 tablet (50 mg total) by mouth every 6 (six) hours as needed., Disp: 16 tablet, Rfl: 0   valsartan (DIOVAN) 160 MG tablet, TAKE 1 TABLET BY MOUTH EVERY DAY, Disp: 90 tablet, Rfl: 1   nitroGLYCERIN (NITROSTAT) 0.4 MG SL tablet, Place 1 tablet (0.4 mg  total) under the tongue every 5 (five) minutes as needed for chest pain. If pain does not resolve with rest. (Patient not taking: Reported on 11/04/2022), Disp: 50 tablet, Rfl: 3   Allergies: No Known Allergies  REVIEW OF SYSTEMS:   Review of Systems  Constitutional:  Negative for chills, fatigue and fever.  HENT:   Negative for lump/mass, mouth sores, nosebleeds, sore throat and trouble swallowing.   Eyes:  Negative for eye problems.  Respiratory:  Positive for shortness of breath. Negative for cough.   Cardiovascular:  Negative for chest pain, leg swelling and palpitations.  Gastrointestinal:  Negative for abdominal pain, constipation, diarrhea, nausea and vomiting.  Genitourinary:  Negative for bladder incontinence, difficulty urinating, dysuria, frequency, hematuria and nocturia.   Musculoskeletal:  Negative for arthralgias, back pain, flank pain, myalgias and neck pain.  Skin:  Negative for itching and rash.  Neurological:  Negative for dizziness, headaches and numbness.  Hematological:  Does not bruise/bleed easily.  Psychiatric/Behavioral:  Negative for depression, sleep disturbance and suicidal ideas. The patient is not nervous/anxious.   All other systems reviewed and are negative.    VITALS:   Blood pressure (!) 142/48, pulse 72, temperature (!) 97.1 F (36.2 C), temperature source Tympanic, resp. rate 20, weight 164 lb 3.2 oz (74.5 kg), SpO2 94%.  Wt Readings from Last 3 Encounters:  11/04/22 164 lb 3.2 oz (74.5 kg)  10/20/22 166 lb (75.3 kg)  10/09/22 166 lb 8 oz (75.5 kg)    Body mass index is 26.5 kg/m.   PHYSICAL EXAM:   Physical Exam Vitals and nursing note reviewed. Exam conducted with a chaperone present.  Constitutional:      Appearance: Normal appearance.  Cardiovascular:     Rate and Rhythm: Normal rate and regular rhythm.     Pulses: Normal pulses.     Heart sounds: Normal heart sounds.  Pulmonary:     Effort: Pulmonary effort is normal.     Breath  sounds: Normal breath sounds.  Abdominal:     Palpations: Abdomen is soft. There is no hepatomegaly, splenomegaly or mass.     Tenderness: There is no abdominal tenderness.  Musculoskeletal:     Right lower leg: No edema.     Left lower leg: No edema.  Lymphadenopathy:     Cervical: No cervical adenopathy.     Right cervical: No superficial, deep or posterior cervical adenopathy.    Left cervical: No superficial, deep or posterior cervical adenopathy.     Upper Body:     Right upper body: No supraclavicular or axillary adenopathy.     Left upper body: No supraclavicular or axillary adenopathy.  Neurological:     General: No focal deficit present.     Mental Status: He is alert and oriented to person, place, and time.  Psychiatric:        Mood and Affect: Mood normal.        Behavior: Behavior normal.     LABS:        Latest Ref Rng & Units 10/28/2022    2:44 PM 04/25/2022    8:49 AM 11/14/2021    1:53 PM  CBC  WBC 4.0 - 10.5 K/uL 7.9   8.9   Hemoglobin 13.0 - 17.0 g/dL 91.4  78.2  95.6   Hematocrit 39.0 - 52.0 % 38.2  38.0  32.9   Platelets 150 - 400 K/uL 289   320       Latest Ref Rng & Units 10/28/2022    2:44 PM 06/03/2022   10:16 AM 04/25/2022    8:49 AM  CMP  Glucose 70 - 99 mg/dL 98   96   BUN 8 - 23 mg/dL 29   41   Creatinine 2.13 - 1.24 mg/dL 0.86   5.78   Sodium 469 - 145 mmol/L 137   138   Potassium 3.5 - 5.1 mmol/L 4.0   5.1   Chloride 98 - 111 mmol/L 112   106   CO2 22 - 32 mmol/L 20     Calcium 8.9 - 10.3 mg/dL 8.7  9.7    Total Protein 6.5 - 8.1 g/dL 6.6     Total Bilirubin 0.3 - 1.2 mg/dL 0.5     Alkaline Phos 38 - 126 U/L 46     AST 15 - 41 U/L 30     ALT 0 - 44 U/L 24        No results found for: "CEA1", "CEA" / No results found for: "CEA1", "CEA" No results found for: "PSA1" No results found for: "GEX528" No results found for: "CAN125"  Lab Results  Component Value Date   TOTALPROTELP 6.0 10/28/2022   ALBUMINELP 3.3 10/28/2022   A1GS 0.2  10/28/2022   A2GS 0.8 10/28/2022   BETS 0.9 10/28/2022   GAMS 0.8 10/28/2022   MSPIKE 0.3 (H) 10/28/2022   SPEI Comment 10/28/2022   Lab Results  Component Value Date  TIBC 337 10/28/2022   TIBC 403 06/03/2022   FERRITIN 46 10/28/2022   FERRITIN 44 06/03/2022   IRONPCTSAT 23 10/28/2022   IRONPCTSAT 26 06/03/2022   Lab Results  Component Value Date   LDH 148 06/03/2022     STUDIES:   ECHOCARDIOGRAM COMPLETE  Result Date: 10/31/2022    ECHOCARDIOGRAM REPORT   Patient Name:   William [name]. Date of Exam: 10/31/2022 Medical Rec #:  [id number]           Height:       66.0 in Accession #:    [id number]          Weight:       166.0 lb Date of Birth:  05-Jan-1941            BSA:          1.848 m Patient Age:    82 years            BP:           160/49 mmHg Patient Gender: M                   HR:           67 bpm. [facility] Procedure: 2D Echo, Cardiac Doppler and Color Doppler Indications:    Dyspnea  History:        Patient has prior history of Echocardiogram examinations, most                 recent 01/30/2021. CAD, Prior CABG, COPD, Signs/Symptoms:Dyspnea;                 Risk Factors:Current Smoker.  Sonographer:    Darlys Gales Referring Phys: [id number] ELIZABETH PECK IMPRESSIONS  1. Left ventricular ejection fraction, by estimation, is 55 to 60%. The left ventricle has normal function. The left ventricle has no regional wall motion abnormalities. Left ventricular diastolic parameters are indeterminate.  2. Right ventricular systolic function is low normal. The right ventricular size is mildly enlarged.  3. Left atrial size was mildly dilated.  4. Mild mitral valve regurgitation.  5. The aortic valve was not well visualized. Aortic valve regurgitation is trivial. Aortic valve sclerosis is present, with no evidence of aortic valve stenosis.  6. The inferior vena cava is normal in size with greater than 50% respiratory variability, suggesting right atrial pressure of 3 mmHg.  FINDINGS  Left Ventricle: Left ventricular ejection fraction, by estimation, is 55 to 60%. The left ventricle has normal function. The left ventricle has no regional wall motion abnormalities. The left ventricular internal cavity size was normal in size. There is  no left ventricular hypertrophy. Left ventricular diastolic parameters are indeterminate. Right Ventricle: The right ventricular size is mildly enlarged. Right vetricular wall thickness was not assessed. Right ventricular systolic function is low normal. Left Atrium: Left atrial size was mildly dilated. Right Atrium: Right atrial size was normal in size. Pericardium: There is no evidence of pericardial effusion. Mitral Valve: Mild mitral annular calcification. Mild mitral valve regurgitation. Tricuspid Valve: The tricuspid valve is normal in structure. Tricuspid valve regurgitation is trivial. Aortic Valve: The aortic valve was not well visualized. Aortic valve regurgitation is trivial. Aortic regurgitation PHT measures 720 msec. Aortic valve sclerosis is present, with no evidence of aortic valve stenosis. Aortic valve mean gradient measures 3.0 mmHg. Aortic valve peak gradient measures 4.8 mmHg. Aortic valve area, by VTI measures 2.39 cm. Pulmonic Valve: The pulmonic valve  was not well visualized. Pulmonic valve regurgitation is not visualized. Aorta: The aortic root and ascending aorta are structurally normal, with no evidence of dilitation. Venous: The inferior vena cava is normal in size with greater than 50% respiratory variability, suggesting right atrial pressure of 3 mmHg. IAS/Shunts: No atrial level shunt detected by color flow Doppler.  LEFT VENTRICLE PLAX 2D LVIDd:         4.60 cm   Diastology LVIDs:         3.20 cm   LV e' medial:    6.64 cm/s LV PW:         0.90 cm   LV E/e' medial:  10.8 LV IVS:        1.10 cm   LV e' lateral:   6.42 cm/s LVOT diam:     2.00 cm   LV E/e' lateral: 11.2 LV SV:         69 LV SV Index:   38 LVOT Area:     3.14  cm  RIGHT VENTRICLE            IVC RV Basal diam:  4.50 cm    IVC diam: 1.70 cm RV Mid diam:    3.70 cm RV S prime:     9.36 cm/s TAPSE (M-mode): 1.8 cm LEFT ATRIUM           Index        RIGHT ATRIUM           Index LA Vol (A2C): 31.0 ml 16.78 ml/m  RA Area:     15.60 cm LA Vol (A4C): 68.1 ml 36.86 ml/m  RA Volume:   41.90 ml  22.68 ml/m  AORTIC VALVE AV Area (Vmax):    2.37 cm AV Area (Vmean):   2.36 cm AV Area (VTI):     2.39 cm AV Vmax:           110.00 cm/s AV Vmean:          79.900 cm/s AV VTI:            0.291 m AV Peak Grad:      4.8 mmHg AV Mean Grad:      3.0 mmHg LVOT Vmax:         82.90 cm/s LVOT Vmean:        60.000 cm/s LVOT VTI:          0.221 m LVOT/AV VTI ratio: 0.76 AI PHT:            720 msec  AORTA Ao Root diam: 3.50 cm Ao Asc diam:  3.60 cm MITRAL VALVE               TRICUSPID VALVE MV Area (PHT): 3.97 cm    TR Peak grad:   28.7 mmHg MV Decel Time: 191 msec    TR Vmax:        268.00 cm/s MV E velocity: 71.60 cm/s MV A velocity: 94.30 cm/s  SHUNTS MV E/A ratio:  0.76        Systemic VTI:  0.22 m                            Systemic Diam: 2.00 cm Dietrich Pates MD Electronically signed by Dietrich Pates MD Signature Date/Time: 10/31/2022/3:23:11 PM    Final

## 2022-11-04 ENCOUNTER — Inpatient Hospital Stay: Payer: Medicare Other | Attending: Hematology | Admitting: Hematology

## 2022-11-04 VITALS — BP 142/48 | HR 72 | Temp 97.1°F | Resp 20 | Wt 164.2 lb

## 2022-11-04 DIAGNOSIS — D472 Monoclonal gammopathy: Secondary | ICD-10-CM | POA: Insufficient documentation

## 2022-11-04 DIAGNOSIS — E611 Iron deficiency: Secondary | ICD-10-CM

## 2022-11-04 NOTE — Patient Instructions (Signed)
[facility] ?Discharge Instructions ? ? ?You were seen and examined today by Dr. Katragadda. ? ?He reviewed the results of your lab work which are normal/stable.  ? ?Return as scheduled. ? ? ?Thank you for choosing [facility] to provide your oncology and hematology care.  To afford each patient quality time with our provider, please arrive at least 15 minutes before your scheduled appointment time.  ? ?If you have a lab appointment with the Cancer Center please come in thru the Main Entrance and check in at the main information desk. ? ?You need to re-schedule your appointment should you arrive 10 or more minutes late.  We strive to give you quality time with our providers, and arriving late affects you and other patients whose appointments are after yours.  Also, if you no show three or more times for appointments you may be dismissed from the clinic at the providers discretion.     ?Again, thank you for choosing [facility].  Our hope is that these requests will decrease the amount of time that you wait before being seen by our physicians.       ?_____________________________________________________________ ? ?Should you have questions after your visit to [facility], please contact our office at [phone] and follow the prompts.  Our office hours are 8:00 a.m. and 4:30 p.m. Monday - Friday.  Please note that voicemails left after 4:00 p.m. may not be returned until the following business day.  We are closed weekends and major holidays.  You do have access to a nurse 24-7, just call the main number to the clinic [phone] and do not press any options, hold on the line and a nurse will answer the phone.   ? ?For prescription refill requests, have your pharmacy contact our office and allow 72 hours.   ? ?Due to Covid, you will need to wear a mask upon entering the hospital. If you do not have a mask, a mask  will be given to you at the Main Entrance upon arrival. For doctor visits, patients may have 1 support person age 18 or older with them. For treatment visits, patients can not have anyone with them due to social distancing guidelines and our immunocompromised population.  ? ?   ?

## 2022-11-26 ENCOUNTER — Other Ambulatory Visit: Payer: Self-pay | Admitting: Cardiology

## 2022-11-29 ENCOUNTER — Other Ambulatory Visit: Payer: Self-pay | Admitting: Internal Medicine

## 2022-12-03 ENCOUNTER — Other Ambulatory Visit: Payer: Self-pay | Admitting: Cardiology

## 2022-12-04 ENCOUNTER — Ambulatory Visit (INDEPENDENT_AMBULATORY_CARE_PROVIDER_SITE_OTHER)
Admission: RE | Admit: 2022-12-04 | Discharge: 2022-12-04 | Disposition: A | Discharge status: Home / Self Care | Payer: Medicare Other | Source: Ambulatory Visit | Attending: Vascular Surgery | Admitting: Vascular Surgery

## 2022-12-04 ENCOUNTER — Ambulatory Visit (HOSPITAL_COMMUNITY)
Admission: RE | Admit: 2022-12-04 | Discharge: 2022-12-04 | Disposition: A | Discharge status: Home / Self Care | Payer: Medicare Other | Source: Ambulatory Visit | Attending: Vascular Surgery | Admitting: Vascular Surgery

## 2022-12-04 ENCOUNTER — Other Ambulatory Visit: Payer: Self-pay

## 2022-12-04 ENCOUNTER — Ambulatory Visit (INDEPENDENT_AMBULATORY_CARE_PROVIDER_SITE_OTHER): Payer: Medicare Other | Admitting: Vascular Surgery

## 2022-12-04 ENCOUNTER — Encounter: Payer: Self-pay | Admitting: Vascular Surgery

## 2022-12-04 ENCOUNTER — Other Ambulatory Visit: Payer: Self-pay | Admitting: Cardiology

## 2022-12-04 ENCOUNTER — Ambulatory Visit: Payer: Medicare Other | Admitting: Vascular Surgery

## 2022-12-04 ENCOUNTER — Other Ambulatory Visit: Payer: Self-pay | Admitting: Nurse Practitioner

## 2022-12-04 ENCOUNTER — Encounter (HOSPITAL_COMMUNITY): Payer: Medicare Other

## 2022-12-04 VITALS — BP 163/77 | HR 56 | Temp 98.0°F | Resp 20 | Ht 66.0 in | Wt 162.0 lb

## 2022-12-04 DIAGNOSIS — I70219 Atherosclerosis of native arteries of extremities with intermittent claudication, unspecified extremity: Secondary | ICD-10-CM | POA: Insufficient documentation

## 2022-12-04 DIAGNOSIS — I6521 Occlusion and stenosis of right carotid artery: Secondary | ICD-10-CM

## 2022-12-04 DIAGNOSIS — I708 Atherosclerosis of other arteries: Secondary | ICD-10-CM | POA: Diagnosis not present

## 2022-12-04 DIAGNOSIS — I739 Peripheral vascular disease, unspecified: Secondary | ICD-10-CM

## 2022-12-04 DIAGNOSIS — R0989 Other specified symptoms and signs involving the circulatory and respiratory systems: Secondary | ICD-10-CM | POA: Diagnosis not present

## 2022-12-04 DIAGNOSIS — R5383 Other fatigue: Secondary | ICD-10-CM

## 2022-12-04 LAB — VAS US ABI WITH/WO TBI
Left ABI: 0.46
Right ABI: 0.26

## 2022-12-04 NOTE — Progress Notes (Signed)
REASON FOR VISIT:   Follow-up of her disease and peripheral arterial disease.  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  It is noted that the bifurcation is high and the stenosis is calcified.  He has no significant stenosis on the left.  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  Although he is asymptomatic, given the severity of the stenosis I think he should be considered for carotid endarterectomy or TCAR.  Based on his duplex the bifurcation is high and he may not be a surgical candidate.  In addition, he was evaluated by cardiology in March and felt to be high risk for any surgical procedure.  He also has significant COPD.  I have recommended that we get a CT angio of the head and neck in order to determine if he might potentially be a candidate for TCAR.  If he is, this could potentially be done under local anesthesia to lower his potential cardiac risk.  He feels strongly about trying to avoid a stroke and therefore would like to consider treatment despite his increased risk.  He has both coronary artery disease and significant COPD.  He is on aspirin, Brilinta, and a statin.  PERIPHERAL ARTERIAL DISEASE WITH CLAUDICATION: This patient has stable claudication of both legs and his symptoms have not progressed.  We had previously considered right femoral endarterectomy for a right common femoral artery occlusion but given that his symptoms are stable certainly we will hold off on any surgery.  He denies any rest pain or nonhealing wounds.  He has been evaluated by cardiology and felt to be high risk for surgery.  I have encouraged him to stay as active as possible.  He is not a smoker.    HPI:   William [name]. is a pleasant 82 y.o. male who had presented with rest pain of the right  foot.  He underwent an arteriogram which showed multilevel arterial occlusive disease.  On the right side, which was the more symptomatic side, there was a calcific plaque in the proximal common iliac artery however this was poorly defined by CO2 arteriography given his renal insufficiency.  The external iliac artery had a stent which was patent.  There was occlusion of the common femoral artery with reconstitution of the deep femoral artery.  The superficial femoral artery was occluded.  I felt that if he required revascularization his best option would be a right common femoral artery endarterectomy and possible profundoplasty.  He would potentially require intraoperative arteriogram with limited contrast to evaluate his proximal right common iliac artery stenosis.   I last saw him on 06/26/2022.  The symptoms in his right leg had improved so we decided to hold off on revascularization.  He would be high risk for surgery given his cardiac and pulmonary comorbidities.  I felt that we would only consider revascularization if he develops critical limb ischemia such as a nonhealing ulcer.  Since I saw him last, he has minimal calf claudication bilaterally.  He denies any thigh or hip claudication.  He denies any history of rest pain or nonhealing wounds.  He denies any history of stroke, TIAs, expressive or receptive aphasia, or amaurosis fugax.  He has no significant problems with dizziness.  His main complaint is significant dyspnea on exertion.  The patient  was seen by Dr. Diona Browner on 06/18/2022.  The patient was there for preoperative cardiac evaluation as we were considering right common femoral endarterectomy and profundoplasty.  The patient has known multivessel disease and is status post coronary revascularization.  He had stable angina.  His preoperative cardiac risk index was class IV with an 11% chance of major adverse cardiac event and thus overall high risk.  This was not felt to be a  significantly modifiable risk and further testing was not recommended.  Past Medical History:  Diagnosis Date   Carotid artery disease (HCC)    Colon polyp    COPD (chronic obstructive pulmonary disease) (HCC)    Coronary artery disease    Multivessel status post CABG with LIMA to LAD and SVG to OM1 2017   Hyperlipidemia    Hypertension    Iliac artery aneurysm (HCC)    Myocardial infarction (HCC)    Peripheral arterial disease (HCC)    Suboptimal revascularization options    Family History  Problem Relation Age of Onset   Heart disease Father        Heart Disease before age 59   Pulmonary embolism Father    Deep vein thrombosis Father    Cancer Mother        Sarcoma    SOCIAL HISTORY: Social History   Tobacco Use   Smoking status: Former    Current packs/day: 0.00    Average packs/day: 2.0 packs/day for 30.5 years (60.9 ttl pk-yrs)    Types: Cigarettes    Start date: 06/03/1961    Quit date: 11/17/1991    Years since quitting: 31.0    Passive exposure: Never   Smokeless tobacco: Former    Types: Snuff, Chew  Substance Use Topics   Alcohol use: Not Currently    Alcohol/week: 2.0 - 4.0 standard drinks of alcohol    Types: 2 - 4 Standard drinks or equivalent per week    Comment: used to, quit 2012    No Known Allergies  Current Outpatient Medications  Medication Sig Dispense Refill   amLODipine (NORVASC) 5 MG tablet Take 2 tablets (10 mg total) by mouth daily. (Patient taking differently: Take 5 mg by mouth 2 (two) times daily.) 90 tablet 3   Ascorbic Acid (VITAMIN C) 1000 MG tablet Take 1,000 mg by mouth in the morning.     aspirin EC 81 MG tablet Take 1 tablet (81 mg total) by mouth daily.     budesonide-formoterol (SYMBICORT) 80-4.5 MCG/ACT inhaler Take 2 puffs first thing in am and then another 2 puffs about 12 hours later. 1 each 11   cholecalciferol (VITAMIN D) 25 MCG (1000 UNIT) tablet Take 1,000 Units by mouth in the morning.     ezetimibe (ZETIA) 10 MG  tablet TAKE 1 TABLET BY MOUTH EVERY DAY 90 tablet 3   famotidine (PEPCID) 20 MG tablet Take 20 mg by mouth every evening.     fish oil-omega-3 fatty acids 1000 MG capsule Take 1,000 mg by mouth in the morning.     folic acid (FOLVITE) 400 MCG tablet Take 400 mcg by mouth in the morning.     Garlic 2000 MG TBEC Take 2,000 mg by mouth in the morning.     isosorbide mononitrate (IMDUR) 60 MG 24 hr tablet TAKE 1 TABLET BY MOUTH EVERY DAY 90 tablet 1   metoprolol tartrate (LOPRESSOR) 50 MG tablet TAKE 1 TABLET BY MOUTH TWICE A DAY 180 tablet 1   Multiple Vitamin (MULTIVITAMIN WITH MINERALS)  TABS tablet Take 1 tablet by mouth in the morning.     nitroGLYCERIN (NITROSTAT) 0.4 MG SL tablet Place 1 tablet (0.4 mg total) under the tongue every 5 (five) minutes as needed for chest pain. If pain does not resolve with rest. 50 tablet 3   NP THYROID 30 MG tablet TAKE 1 TABLET (30 MG TOTAL) BY MOUTH DAILY BEFORE BREAKFAST 90 tablet 1   omeprazole (PRILOSEC OTC) 20 MG tablet Take 1 tablet (20 mg total) by mouth daily. Take 30-60 min before first meal of the day     rosuvastatin (CRESTOR) 10 MG tablet TAKE 1 TABLET BY MOUTH EVERY DAY 90 tablet 3   sodium bicarbonate 650 MG tablet Take 650 mg by mouth 2 (two) times daily.     thiamine (VITAMIN B-1) 100 MG tablet Take 100 mg by mouth every evening.      ticagrelor (BRILINTA) 60 MG TABS tablet Take 1 tablet (60 mg total) by mouth 2 (two) times daily. 60 tablet 6   traMADol (ULTRAM) 50 MG tablet Take 1 tablet (50 mg total) by mouth every 6 (six) hours as needed. 16 tablet 0   valsartan (DIOVAN) 160 MG tablet Take 1 tablet (160 mg total) by mouth daily. **NEEDS APPT FOR FURTHER REFILLS** 30 tablet 0   No current facility-administered medications for this visit.    REVIEW OF SYSTEMS:  [X]  denotes positive finding, [ ]  denotes negative finding Cardiac  Comments:  Chest pain or chest pressure:    Shortness of breath upon exertion: x   Short of breath when lying  flat:    Irregular heart rhythm:        Vascular    Pain in calf, thigh, or hip brought on by ambulation:    Pain in feet at night that wakes you up from your sleep:     Blood clot in your veins:    Leg swelling:         Pulmonary    Oxygen at home:    Productive cough:     Wheezing:         Neurologic    Sudden weakness in arms or legs:     Sudden numbness in arms or legs:     Sudden onset of difficulty speaking or slurred speech:    Temporary loss of vision in one eye:     Problems with dizziness:         Gastrointestinal    Blood in stool:     Vomited blood:         Genitourinary    Burning when urinating:     Blood in urine:        Psychiatric    Major depression:         Hematologic    Bleeding problems:    Problems with blood clotting too easily:        Skin    Rashes or ulcers:        Constitutional    Fever or chills:     PHYSICAL EXAM:   Vitals:   12/04/22 1350 12/04/22 1352  BP: (!) 92/55 (!) 163/77  Pulse: (!) 56   Resp: 20   Temp: 98 F (36.7 C)   SpO2: 95%   Weight: 162 lb (73.5 kg)   Height: 5\' 6"  (1.676 m)     GENERAL: The patient is a well-nourished male, in no acute distress. The vital signs are documented above. CARDIAC: There is a regular rate and  rhythm.  VASCULAR: He has bilateral carotid bruits. I cannot palpate a right femoral pulse.  He has a palpable left femoral pulse. I can palpate pedal pulses. PULMONARY: There is good air exchange bilaterally without wheezing or rales. ABDOMEN: Soft and non-tender with normal pitched bowel sounds.  MUSCULOSKELETAL: There are no major deformities or cyanosis. NEUROLOGIC: No focal weakness or paresthesias are detected. SKIN: There are no ulcers or rashes noted. PSYCHIATRIC: The patient has a normal affect.  DATA:    CAROTID DUPLEX: I have independently interpreted his carotid duplex scan today.  On the right side he has a greater than 80% stenosis in the mid ICA.  The bifurcation is  noted to be high in the plaque is heavily calcified.  The right vertebral artery is patent with antegrade flow.  On the left side there is a less than 39% stenosis the left vertebral artery has retrograde flow.  ARTERIAL DOPPLER STUDY: I have independently interpreted his arterial Doppler study today.  On the right side there is a dampened monophasic posterior tibial and dorsalis pedis signal.  ABIs 26%.  Toe pressure is 26 mmHg.  On the left side there is a monophasic posterior tibial and dorsalis pedis signal.  ABIs 46%.  Toe pressures 34 mmHg.  Waverly Ferrari [facility] [phone]

## 2022-12-06 ENCOUNTER — Telehealth: Payer: Self-pay | Admitting: Family Medicine

## 2022-12-06 NOTE — Telephone Encounter (Signed)
Can you pleas attach 90 day compliance report? TY!

## 2022-12-08 ENCOUNTER — Ambulatory Visit: Payer: Medicare Other | Admitting: Nurse Practitioner

## 2022-12-16 NOTE — Addendum Note (Signed)
Addended by: Leilani Able, Briauna Gilmartin A on: 12/16/2022 02:32 PM   Modules accepted: Orders

## 2022-12-17 NOTE — Addendum Note (Signed)
Addended by: Leilani Able, Jaimarie Rapozo A on: 12/17/2022 03:16 PM   Modules accepted: Orders

## 2022-12-18 ENCOUNTER — Other Ambulatory Visit: Payer: Medicare Other

## 2022-12-18 ENCOUNTER — Ambulatory Visit
Admission: RE | Admit: 2022-12-18 | Discharge: 2022-12-18 | Disposition: A | Discharge status: Home / Self Care | Payer: Medicare Other | Source: Ambulatory Visit | Attending: Vascular Surgery | Admitting: Vascular Surgery

## 2022-12-18 ENCOUNTER — Ambulatory Visit: Payer: Medicare Other | Admitting: Vascular Surgery

## 2022-12-18 ENCOUNTER — Inpatient Hospital Stay: Admission: RE | Admit: 2022-12-18 | Payer: Medicare Other | Source: Ambulatory Visit

## 2022-12-18 ENCOUNTER — Other Ambulatory Visit: Payer: Self-pay | Admitting: Vascular Surgery

## 2022-12-18 ENCOUNTER — Encounter: Payer: Self-pay | Admitting: Vascular Surgery

## 2022-12-18 DIAGNOSIS — I6521 Occlusion and stenosis of right carotid artery: Secondary | ICD-10-CM

## 2022-12-18 DIAGNOSIS — I621 Nontraumatic extradural hemorrhage: Secondary | ICD-10-CM | POA: Diagnosis not present

## 2022-12-18 DIAGNOSIS — I708 Atherosclerosis of other arteries: Secondary | ICD-10-CM

## 2022-12-18 DIAGNOSIS — I739 Peripheral vascular disease, unspecified: Secondary | ICD-10-CM

## 2022-12-18 DIAGNOSIS — J439 Emphysema, unspecified: Secondary | ICD-10-CM | POA: Diagnosis not present

## 2022-12-18 MED ORDER — IOPAMIDOL (ISOVUE-370) INJECTION 76%
500.0000 mL | Freq: Once | INTRAVENOUS | Status: AC | PRN
  Administered 2022-12-18: 60 mL via INTRAVENOUS

## 2022-12-18 MED ORDER — IOPAMIDOL (ISOVUE-370) INJECTION 76%: 500.0000 mL | Freq: Once | INTRAVENOUS | Status: DC | PRN

## 2022-12-18 NOTE — Progress Notes (Signed)
Patient name: William [name]. DOB: [birth year] Sex: male    Referring Provider is William Paddy, NP  PCP is William Paddy, NP  REASON FOR VIRTUAL VISIT: Follow-up after CT angiogram.  I connected with William [name]. on 12/18/22 at  4:00 PM EDT by a telephone visit and verified that I am speaking with the correct person using two identifiers. I discussed the limitations of evaluation and management by telemedicine and the availability of in person appointments. The patient expressed understanding and agreed to proceed.  Location: Patient: Home Provider: Office  HPI: William [name]. is a 82 y.o. male who I last saw on 12/04/2022.  This patient had carotid bruits which prompted a CT scan which showed a greater than 80% right carotid stenosis.  It was noted that the bifurcation was high and the stenosis was heavily calcified.  There was no significant stenosis on the left.  He was asymptomatic.  However, given the severity the stenosis I felt that he should be considered for TCAR.  Of note, we were considering lower extremity revascularization previously and he underwent cardiac evaluation in March and was felt to be high risk for any surgical procedure.  He also has significant COPD.  I recommended a CT angiogram of the neck to see if he was a candidate for TCAR which could potentially be done under local anesthetic given his cardiac risk.  At that time he was on aspirin, Brilinta, and a statin.  The patient states that he has not had any focal weakness or paresthesias.  He denies any expressive or receptive aphasia or amaurosis fugax.  He remains on aspirin, Brilinta, and a statin.  Current Outpatient Medications  Medication Sig Dispense Refill   amLODipine (NORVASC) 5 MG tablet Take 2 tablets (10 mg total) by mouth daily. (Patient taking differently: Take 5 mg by mouth 2 (two) times daily.) 90 tablet 3   Ascorbic Acid (VITAMIN C) 1000 MG tablet Take 1,000 mg by mouth in the morning.      aspirin EC 81 MG tablet Take 1 tablet (81 mg total) by mouth daily.     budesonide-formoterol (SYMBICORT) 80-4.5 MCG/ACT inhaler Take 2 puffs first thing in am and then another 2 puffs about 12 hours later. 1 each 11   cholecalciferol (VITAMIN D) 25 MCG (1000 UNIT) tablet Take 1,000 Units by mouth in the morning.     ezetimibe (ZETIA) 10 MG tablet TAKE 1 TABLET BY MOUTH EVERY DAY 90 tablet 3   famotidine (PEPCID) 20 MG tablet Take 20 mg by mouth every evening.     fish oil-omega-3 fatty acids 1000 MG capsule Take 1,000 mg by mouth in the morning.     folic acid (FOLVITE) 400 MCG tablet Take 400 mcg by mouth in the morning.     Garlic 2000 MG TBEC Take 2,000 mg by mouth in the morning.     isosorbide mononitrate (IMDUR) 60 MG 24 hr tablet TAKE 1 TABLET BY MOUTH EVERY DAY 90 tablet 1   metoprolol tartrate (LOPRESSOR) 50 MG tablet TAKE 1 TABLET BY MOUTH TWICE A DAY 180 tablet 1   Multiple Vitamin (MULTIVITAMIN WITH MINERALS) TABS tablet Take 1 tablet by mouth in the morning.     nitroGLYCERIN (NITROSTAT) 0.4 MG SL tablet Place 1 tablet (0.4 mg total) under the tongue every 5 (five) minutes as needed for chest pain. If pain does not resolve with rest. 50 tablet 3   NP THYROID 30  MG tablet TAKE 1 TABLET (30 MG TOTAL) BY MOUTH DAILY BEFORE BREAKFAST 90 tablet 1   omeprazole (PRILOSEC OTC) 20 MG tablet Take 1 tablet (20 mg total) by mouth daily. Take 30-60 min before first meal of the day     rosuvastatin (CRESTOR) 10 MG tablet TAKE 1 TABLET BY MOUTH EVERY DAY 90 tablet 3   sodium bicarbonate 650 MG tablet Take 650 mg by mouth 2 (two) times daily.     thiamine (VITAMIN B-1) 100 MG tablet Take 100 mg by mouth every evening.      ticagrelor (BRILINTA) 60 MG TABS tablet Take 1 tablet (60 mg total) by mouth 2 (two) times daily. 60 tablet 6   traMADol (ULTRAM) 50 MG tablet Take 1 tablet (50 mg total) by mouth every 6 (six) hours as needed. 16 tablet 0   valsartan (DIOVAN) 160 MG tablet Take 1 tablet (160  mg total) by mouth daily. **NEEDS APPT FOR FURTHER REFILLS** 30 tablet 0   No current facility-administered medications for this visit.   Facility-Administered Medications Ordered in Other Visits  Medication Dose Route Frequency Provider Last Rate Last Admin   iopamidol (ISOVUE-370) 76 % injection 500 mL  500 mL Intravenous Once PRN Chuck Hint, MD       REVIEW OF SYSTEMS: Arly.Keller ] denotes positive finding; [  ] denotes negative finding  CARDIOVASCULAR:  [ ]  chest pain   [ ]  dyspnea on exertion  [ ]  leg swelling  CONSTITUTIONAL:  [ ]  fever   [ ]  chills   OBSERVATIONS/OBJECTIVE: There were no vitals filed for this visit. The patient was alert and oriented on the phone. He was not short of breath.  DATA: I did look at the images of his CT angiogram that was done today.  This has not yet been read by radiology.  On the right side, which is the side of concern, there is a very tight right carotid stenosis which is heavily calcified.  The bifurcation is high and the plaque extends well into the internal carotid artery.  I do not think that this is surgically accessible.  He has calcific plaque on the left side but no critical stenosis.  There is some calcific disease in the common carotid artery on the right which is somewhat tortuous.  MEDICAL ISSUES:  ASYMPTOMATIC GREATER THAN 80% RIGHT CAROTID STENOSIS: This patient has an asymptomatic greater than 80% right carotid stenosis.  He is at high risk for surgery based on his previous cardiac evaluation.  The carotid stenosis does not appear to be surgically accessible.  I think his 2 options are either continued maximal medical therapy versus consideration for TCAR under local anesthesia.  My concern is that the plaque is heavily calcified and he may not be a candidate for TCAR.  There is also some calcific disease in the common carotid artery which is slightly tortuous.  I will review these films with my partners.  I explained that I will  contact him once we have reviewed the films and can make further recommendations.  For now he will continue his aspirin, Brilinta Brilinta, and statin.  FOLLOW UP INSTRUCTIONS:   I discussed the assessment and treatment plan with the patient. The patient was provided an opportunity to ask questions and all were answered. The patient agreed with the plan and demonstrated an understanding of the instructions. The patient was advised to call back or seek an in-person evaluation if the symptoms worsen or if the condition fails  to improve as anticipated.  Waverly Ferrari [facility]

## 2022-12-24 ENCOUNTER — Telehealth (INDEPENDENT_AMBULATORY_CARE_PROVIDER_SITE_OTHER): Payer: Self-pay | Admitting: Vascular Surgery

## 2022-12-24 NOTE — Telephone Encounter (Signed)
VASCULAR SURGERY:  This patient had a carotid bruit which prompted a carotid duplex scan that showed a greater than 80% right carotid stenosis.  He underwent a CT angiogram of the neck which showed a heavily calcified high lesion on the left.  He has previously been evaluated by cardiology for potential lower extremity revascularization was felt to be high risk.  The details of his history please refer to my note on 12/18/2022.  I discussed the situation with the patient on the phone today.  I have reviewed the films with Dr. Randie Heinz.  I have explained that the options are to proceed with TCAR on the right although it would be associated with slightly increased risk given the heavily calcified plaque.  He would would likely require use of the shockwave device at the time of the procedure.  Ideally this would be done under local anesthesia.  Alternatively he can get a follow-up duplex scan in 6 months and only consider TCAR if he develops new right hemispheric symptoms.  We have reviewed the potential symptoms of cerebrovascular disease.  He has discussed this with his son and he would prefer to have a follow-up duplex scan in 6 months and see Dr. Randie Heinz at that time.  He will let us know if he has any new neurologic symptoms.  Cari Caraway, MD 11:25 AM

## 2022-12-26 ENCOUNTER — Other Ambulatory Visit: Payer: Self-pay | Admitting: Internal Medicine

## 2023-01-02 ENCOUNTER — Ambulatory Visit (INDEPENDENT_AMBULATORY_CARE_PROVIDER_SITE_OTHER): Payer: Medicare Other

## 2023-01-02 VITALS — Ht 66.0 in | Wt 158.0 lb

## 2023-01-02 DIAGNOSIS — Z0000 Encounter for general adult medical examination without abnormal findings: Secondary | ICD-10-CM

## 2023-01-02 NOTE — Patient Instructions (Addendum)
William [name] , Thank you for taking time to come for your Medicare Wellness Visit. I appreciate your ongoing commitment to your health goals. Please review the following plan we discussed and let me know if I can assist you in the future.   Referrals/Orders/Follow-Ups/Clinician Recommendations: You are due for a Flu vaccine and your 2nd dose of the Shingles vaccine.  Also you are due for a yearly eye exam.  It was nice speaking with you today.  Keep up the good work.  Each day, aim for 6 glasses of water, plenty of protein in your diet and try to get up and walk/ stretch every hour for 5-10 minutes at a time.    This is a list of the screening recommended for you and due dates:  Health Maintenance  Topic Date Due   Zoster (Shingles) Vaccine (2 of 2) 07/11/2022   Flu Shot  10/30/2022   Medicare Annual Wellness Visit  01/02/2024   DTaP/Tdap/Td vaccine (2 - Td or Tdap) 01/06/2028   Pneumonia Vaccine  Completed   HPV Vaccine  Aged Out   COVID-19 Vaccine  Discontinued   Hepatitis C Screening  Discontinued    Advanced directives: (Copy Requested) Please bring a copy of your health care power of attorney and living will to the office to be added to your chart at your convenience.  Next Medicare Annual Wellness Visit scheduled for next year: Yes

## 2023-01-02 NOTE — Progress Notes (Signed)
Subjective:   William [name]. is a 82 y.o. male who presents for Medicare Annual/Subsequent preventive examination.  Visit Complete: Virtual  I connected with  William [name]. on 01/02/23 by a audio enabled telemedicine application and verified that I am speaking with the correct person using two identifiers.  Patient Location: Home  Provider Location: Office/Clinic  I discussed the limitations of evaluation and management by telemedicine. The patient expressed understanding and agreed to proceed.  Because this visit was a virtual/telehealth visit, some criteria may be missing or patient reported. Any vitals not documented were not able to be obtained and vitals that have been documented are patient reported.    Cardiac Risk Factors include: advanced age (>31men, >36 women);male gender;hypertension;Other (see comment), Risk factor comments: Periperal vascular disease, NSTEMI, CAD, CKD, COPD GOLD     Objective:    Today's Vitals   01/02/23 0816  Weight: 158 lb (71.7 kg)  Height: 5\' 6"  (1.676 m)   Body mass index is 25.5 kg/m.     01/02/2023    8:24 AM 11/04/2022   11:51 AM 06/24/2022   11:11 AM 06/03/2022    9:48 AM 12/30/2021    2:08 PM 03/07/2021   12:47 PM 12/21/2020    1:14 PM  Advanced Directives  Does Patient Have a Medical Advance Directive? Yes Yes Yes Yes Yes Yes Yes  Type of Estate agent of [place];Living will Living will;Healthcare Power of [employer] Power of [place];Living will Healthcare Power of [place];Living will Healthcare Power of [place];Living will Living will;Healthcare Power of [employer] Power of [place];Living will  Does patient want to make changes to medical advance directive?  No - Patient declined No - Patient declined No - Patient declined  No - Patient declined No - Patient declined  Copy of Healthcare Power of Attorney in Chart? No - copy requested No - copy requested No - copy requested No - copy  requested No - copy requested  No - copy requested  Would patient like information on creating a medical advance directive?  No - Patient declined No - Patient declined        Current Medications (verified) Outpatient Encounter Medications as of 01/02/2023  Medication Sig   Ascorbic Acid (VITAMIN C) 1000 MG tablet Take 1,000 mg by mouth in the morning.   aspirin EC 81 MG tablet Take 1 tablet (81 mg total) by mouth daily.   budesonide-formoterol (SYMBICORT) 80-4.5 MCG/ACT inhaler Take 2 puffs first thing in am and then another 2 puffs about 12 hours later.   cholecalciferol (VITAMIN D) 25 MCG (1000 UNIT) tablet Take 1,000 Units by mouth in the morning.   ezetimibe (ZETIA) 10 MG tablet TAKE 1 TABLET BY MOUTH EVERY DAY   famotidine (PEPCID) 20 MG tablet Take 20 mg by mouth every evening.   fish oil-omega-3 fatty acids 1000 MG capsule Take 1,000 mg by mouth in the morning.   folic acid (FOLVITE) 400 MCG tablet Take 400 mcg by mouth in the morning.   Garlic 2000 MG TBEC Take 2,000 mg by mouth in the morning.   isosorbide mononitrate (IMDUR) 60 MG 24 hr tablet TAKE 1 TABLET BY MOUTH EVERY DAY   metoprolol tartrate (LOPRESSOR) 50 MG tablet TAKE 1 TABLET BY MOUTH TWICE A DAY   Multiple Vitamin (MULTIVITAMIN WITH MINERALS) TABS tablet Take 1 tablet by mouth in the morning.   nitroGLYCERIN (NITROSTAT) 0.4 MG SL tablet Place 1 tablet (0.4 mg total) under the tongue  every 5 (five) minutes as needed for chest pain. If pain does not resolve with rest.   NP THYROID 30 MG tablet TAKE 1 TABLET (30 MG TOTAL) BY MOUTH DAILY BEFORE BREAKFAST   omeprazole (PRILOSEC OTC) 20 MG tablet Take 1 tablet (20 mg total) by mouth daily. Take 30-60 min before first meal of the day   rosuvastatin (CRESTOR) 10 MG tablet TAKE 1 TABLET BY MOUTH EVERY DAY   sodium bicarbonate 650 MG tablet Take 650 mg by mouth 2 (two) times daily.   thiamine (VITAMIN B-1) 100 MG tablet Take 100 mg by mouth every evening.    ticagrelor (BRILINTA)  60 MG TABS tablet Take 1 tablet (60 mg total) by mouth 2 (two) times daily.   traMADol (ULTRAM) 50 MG tablet Take 1 tablet (50 mg total) by mouth every 6 (six) hours as needed.   valsartan (DIOVAN) 160 MG tablet Take 1 tablet (160 mg total) by mouth daily. NEEDS APPT FOR FURTHER REFILLS   amLODipine (NORVASC) 5 MG tablet Take 2 tablets (10 mg total) by mouth daily. (Patient not taking: Reported on 01/02/2023)   No facility-administered encounter medications on file as of 01/02/2023.    Allergies (verified) Patient has no known allergies.   History: Past Medical History:  Diagnosis Date   Carotid artery disease (HCC)    Colon polyp    COPD (chronic obstructive pulmonary disease) (HCC)    Coronary artery disease    Multivessel status post CABG with LIMA to LAD and SVG to OM1 2017   Hyperlipidemia    Hypertension    Iliac artery aneurysm (HCC)    Myocardial infarction (HCC)    Peripheral arterial disease (HCC)    Suboptimal revascularization options   Past Surgical History:  Procedure Laterality Date   ABDOMINAL AORTOGRAM W/LOWER EXTREMITY N/A 08/21/2017   Procedure: ABDOMINAL AORTOGRAM W/LOWER EXTREMITY;  Surgeon: Chuck Hint, MD;  Location: [facility] INVASIVE CV LAB;  Service: Cardiovascular;  Laterality: N/A;   ABDOMINAL AORTOGRAM W/LOWER EXTREMITY N/A 04/25/2022   Procedure: ABDOMINAL AORTOGRAM W/LOWER EXTREMITY;  Surgeon: Chuck Hint, MD;  Location: [facility] INVASIVE CV LAB;  Service: Cardiovascular;  Laterality: N/A;   Bilateral renal  artery stenoses  04/23/2009   CARDIAC CATHETERIZATION N/A 05/21/2015   Procedure: Left Heart Cath and Coronary Angiography;  Surgeon: Runell Gess, MD;  Location: [facility]VASIVE CV LAB;  Service: Cardiovascular;  Laterality: N/A;   CORONARY ARTERY BYPASS GRAFT N/A 05/22/2015   Procedure: CORONARY ARTERY BYPASS GRAFTING (CABG) LIMA-LAD and SVG-OM EVH RIGHT THIGH GREATER SAPHENOUS VEIN;  Surgeon: Delight Ovens, MD;  Location: [facility] OR;   Service: Open Heart Surgery;  Laterality: N/A;   CORONARY ATHERECTOMY N/A 12/21/2020   Procedure: CORONARY ATHERECTOMY;  Surgeon: Kathleene Hazel, MD;  Location: [facility] INVASIVE CV LAB;  Service: Cardiovascular;  Laterality: N/A;   CORONARY BALLOON ANGIOPLASTY N/A 12/06/2020   Procedure: CORONARY BALLOON ANGIOPLASTY;  Surgeon: Kathleene Hazel, MD;  Location: [facility] INVASIVE CV LAB;  Service: Cardiovascular;  Laterality: N/A;   CORONARY STENT INTERVENTION N/A 12/21/2020   Procedure: CORONARY STENT INTERVENTION;  Surgeon: Kathleene Hazel, MD;  Location: [facility] INVASIVE CV LAB;  Service: Cardiovascular;  Laterality: N/A;   CORONARY ULTRASOUND/IVUS N/A 12/21/2020   Procedure: Intravascular Ultrasound/IVUS;  Surgeon: Kathleene Hazel, MD;  Location: [facility] INVASIVE CV LAB;  Service: Cardiovascular;  Laterality: N/A;   HIATAL HERNIA REPAIR     LEFT HEART CATH AND CORS/GRAFTS ANGIOGRAPHY N/A 12/06/2020   Procedure: LEFT HEART CATH  AND CORS/GRAFTS ANGIOGRAPHY;  Surgeon: Kathleene Hazel, MD;  Location: [facility] INVASIVE CV LAB;  Service: Cardiovascular;  Laterality: N/A;   Percutaneous translumnial angioplasty  04/23/2009   Catheterization of [place]efst external iliac artery with Left lower extremity runoff   TEE WITHOUT CARDIOVERSION N/A 05/22/2015   Procedure: TRANSESOPHAGEAL ECHOCARDIOGRAM (TEE);  Surgeon: Delight Ovens, MD;  Location: [facility] OR;  Service: Open Heart Surgery;  Laterality: N/A;   Family History  Problem Relation Age of Onset   Heart disease Father        Heart Disease before age 43   Pulmonary embolism Father    Deep vein thrombosis Father    Cancer Mother        Sarcoma   Social History   Socioeconomic History   Marital status: Widowed    Spouse name: Not on file   Number of children: 2   Years of education: Not on file   Highest education level: Not on file  Occupational History   Occupation: Retired  Tobacco Use   Smoking status: Former    Current packs/day: 0.00     Average packs/day: 2.0 packs/day for 30.5 years (60.9 ttl pk-yrs)    Types: Cigarettes    Start date: 06/03/1961    Quit date: 11/17/1991    Years since quitting: 31.1    Passive exposure: Never   Smokeless tobacco: Former    Types: Snuff, Chew  Vaping Use   Vaping status: Never Used  Substance and Sexual Activity   Alcohol use: Not Currently    Alcohol/week: 2.0 - 4.0 standard drinks of alcohol    Types: 2 - 4 Standard drinks or equivalent per week    Comment: used to, quit 2012   Drug use: No   Sexual activity: Not Currently  Other Topics Concern   Not on file  Social History Narrative   Retired used to work in [employer]. Widower. Lives alone.   Social Determinants of Health   Financial Resource Strain: Low Risk  (01/02/2023)   Overall Financial Resource Strain (CARDIA)    Difficulty of Paying Living Expenses: Not hard at all  Food Insecurity: No Food Insecurity (01/02/2023)   Hunger Vital Sign    Worried About Running Out of Food in the Last Year: Never true    Ran Out of Food in the Last Year: Never true  Transportation Needs: No Transportation Needs (01/02/2023)   PRAPARE - Administrator, Civil Service (Medical): No    Lack of Transportation (Non-Medical): No  Physical Activity: Inactive (01/02/2023)   Exercise Vital Sign    Days of Exercise per Week: 0 days    Minutes of Exercise per Session: 0 min  Stress: No Stress Concern Present (01/02/2023)   [employer] of Occupational Health - Occupational Stress Questionnaire    Feeling of Stress : Not at all  Social Connections: Moderately Isolated (01/02/2023)   Social Connection and Isolation Panel [NHANES]    Frequency of Communication with Friends and Family: Once a week    Frequency of Social Gatherings with Friends and Family: More than three times a week    Attends Religious Services: Never    Database administrator or Organizations: Yes    Attends Engineer, structural: More than 4 times  per year    Marital Status: Widowed    Tobacco Counseling Counseling given: Not Answered   Clinical Intake:  Pre-visit preparation completed: Yes        BMI -  recorded: 25.5 Nutritional Risks: None  How often do you need to have someone help you when you read instructions, pamphlets, or other written materials from your doctor or pharmacy?: 1 - Never  Interpreter Needed?: No  Information entered by :: William [name], RMA   Activities of Daily Living    01/02/2023    8:20 AM 01/27/2022    2:46 PM  In your present state of health, do you have any difficulty performing the following activities:  Hearing? 0 0  Vision? 0 0  Difficulty concentrating or making decisions? 0 0  Walking or climbing stairs? 0 1  Dressing or bathing? 0 0  Doing errands, shopping? 0 0  Preparing Food and eating ? N N  Using the Toilet? N N  In the past six months, have you accidently leaked urine? N N  Do you have problems with loss of bowel control? N N  Managing your Medications? N N  Managing your Finances? N N  Housekeeping or managing your Housekeeping? William [name]    Patient Care Team: [name], NP as PCP - General (Nurse Practitioner) Jonelle Sidle, MD as PCP - Cardiology (Cardiology) Chuck Hint, MD (Inactive) as Consulting Physician (Vascular Surgery) [facility], [facility] as Consulting Physician (Optometry)  Indicate any recent Medical Services you may have received from other than [facility] providers in the past year (date may be approximate).     Assessment:   This is a routine wellness examination for [place].  Hearing/Vision screen Hearing Screening - Comments:: Denies hearing difficulties   Vision Screening - Comments:: Denies vision issues   Goals Addressed               This Visit's Progress     Patient Stated (pt-stated)        Hope to be better with cholesterol levels      Depression Screen    01/02/2023    8:30 AM 10/09/2022    10:00 AM 06/03/2022    9:53 AM 04/10/2022   10:22 AM 02/27/2022   10:27 AM 01/27/2022    2:45 PM 12/30/2021    1:35 PM  PHQ 2/9 Scores  PHQ - 2 Score 0 0 1 0 0 0 3  PHQ- 9 Score 0   0 0  6    Fall Risk    01/02/2023    8:25 AM 10/09/2022   10:00 AM 04/10/2022   10:21 AM 02/27/2022   10:26 AM 01/27/2022    2:43 PM  Fall Risk   Falls in the past year? 0 0 0 0 0  Number falls in past yr: 0 0 0 0 0  Injury with Fall? 0 0 0 0 0  Risk for fall due to : No Fall Risks No Fall Risks No Fall Risks No Fall Risks Impaired balance/gait;Impaired mobility  Risk for fall due to: Comment     Has PVD  Follow up Falls prevention discussed Falls evaluation completed Falls evaluation completed Falls evaluation completed     MEDICARE RISK AT HOME: Medicare Risk at Home Any stairs in or around the home?: Yes If so, are there any without handrails?: Yes Home free of loose throw rugs in walkways, pet beds, electrical cords, etc?: Yes Adequate lighting in your home to reduce risk of falls?: Yes Life alert?: No Use of a cane, walker or w/c?: No Grab bars in the bathroom?: No Shower chair or bench in shower?: No Elevated toilet seat or a handicapped  toilet?: No  TIMED UP AND GO:  Was the test performed?  No    Cognitive Function:      01/25/2019   11:20 AM  Montreal Cognitive Assessment   Visuospatial/ Executive (0/5) 3  Naming (0/3) 2  Attention: Read list of digits (0/2) 2  Attention: Read list of letters (0/1) 1  Attention: Serial 7 subtraction starting at 100 (0/3) 3  Language: Repeat phrase (0/2) 0  Language : Fluency (0/1) 0  Abstraction (0/2) 2  Delayed Recall (0/5) 2  Orientation (0/6) 5  Total 20  Adjusted Score (based on education) 21      01/02/2023    8:25 AM 12/30/2021    1:36 PM 01/26/2020   10:22 AM  6CIT Screen  What Year? 0 points 0 points 0 points  What month? 0 points 0 points 0 points  What time? 0 points 0 points 0 points  Count back from 20 0 points 0 points 0  points  Months in reverse 0 points 0 points 0 points  Repeat phrase 0 points 0 points 0 points  Total Score 0 points 0 points 0 points    Immunizations Immunization History  Administered Date(s) Administered   Fluad Quad(high Dose 65+) 01/25/2019, 01/26/2020, 12/22/2020, 12/30/2021   Moderna Sars-Covid-2 Vaccination 05/21/2019, 02/04/2020, 02/29/2020, 08/13/2020   PNEUMOCOCCAL CONJUGATE-20 11/28/2021   Pfizer(Comirnaty)Fall Seasonal Vaccine 12 years and older 05/16/2022   Pneumococcal Polysaccharide-23 01/28/2017   Tdap 01/05/2018    TDAP status: Up to date  Flu Vaccine status: Due, Education has been provided regarding the importance of this vaccine. Advised may receive this vaccine at local pharmacy or Health Dept. Aware to provide a copy of the vaccination record if obtained from local pharmacy or Health Dept. Verbalized acceptance and understanding.  Pneumococcal vaccine status: Up to date  Covid-19 vaccine status: Completed vaccines  Qualifies for Shingles Vaccine? Yes   Zostavax completed Yes   Shingrix Completed?: No.    Education has been provided regarding the importance of this vaccine. Patient has been advised to call insurance company to determine out of pocket expense if they have not yet received this vaccine. Advised may also receive vaccine at local pharmacy or Health Dept. Verbalized acceptance and understanding.  Screening Tests Health Maintenance  Topic Date Due   Zoster Vaccines- Shingrix (2 of 2) 07/11/2022   INFLUENZA VACCINE  10/30/2022   Medicare Annual Wellness (AWV)  01/02/2024   DTaP/Tdap/Td (2 - Td or Tdap) 01/06/2028   Pneumonia Vaccine 27+ Years old  Completed   HPV VACCINES  Aged Out   COVID-19 Vaccine  Discontinued   Hepatitis C Screening  Discontinued    Health Maintenance  Health Maintenance Due  Topic Date Due   Zoster Vaccines- Shingrix (2 of 2) 07/11/2022   INFLUENZA VACCINE  10/30/2022    Colorectal cancer screening: No longer  required.   Lung Cancer Screening: (Low Dose CT Chest recommended if Age 36-80 years, 20 pack-year currently smoking OR have quit w/in 15years.) does not qualify.   Lung Cancer Screening Referral: N/A  Additional Screening:  Hepatitis C Screening: does not qualify; Completed 07/04/2020  Vision Screening: Recommended annual ophthalmology exams for early detection of glaucoma and other disorders of the eye. Is the patient up to date with their annual eye exam?  No  Who is the provider or what is the name of the office in which the patient attends annual eye exams? [facility] [place] If pt is not established with a  provider, would they like to be referred to a provider to establish care? No .   Dental Screening: Recommended annual dental exams for proper oral hygiene   Community Resource Referral / Chronic Care Management: CRR required this visit?  No   CCM required this visit?  No     Plan:     I have personally reviewed and noted the following in the patient's chart:   Medical and social history Use of alcohol, tobacco or illicit drugs  Current medications and supplements including opioid prescriptions. Patient is not currently taking opioid prescriptions. Functional ability and status Nutritional status Physical activity Advanced directives List of other physicians Hospitalizations, surgeries, and ER visits in previous 12 months Vitals Screenings to include cognitive, depression, and falls Referrals and appointments  In addition, I have reviewed and discussed with patient certain preventive protocols, quality metrics, and best practice recommendations. A written personalized care plan for preventive services as well as general preventive health recommendations were provided to patient.     William [name], CMA   01/02/2023   After Visit Summary: (Mail) Due to this being a telephonic visit, the after visit summary with patients personalized plan was offered  to patient via mail   Nurse Notes: Patient is due for a Flu vaccine and his 2nd Shingles dose.  Patient is also due for a yearly eye exam, as he stated that he has not had a check in about 10 years.  Patient had no other concerns to address today.

## 2023-01-05 ENCOUNTER — Encounter: Payer: Self-pay | Admitting: Nurse Practitioner

## 2023-01-05 ENCOUNTER — Ambulatory Visit: Payer: Medicare Other | Attending: Nurse Practitioner | Admitting: Nurse Practitioner

## 2023-01-05 VITALS — BP 160/61 | HR 62 | Ht 66.0 in | Wt 161.8 lb

## 2023-01-05 DIAGNOSIS — R0609 Other forms of dyspnea: Secondary | ICD-10-CM | POA: Insufficient documentation

## 2023-01-05 DIAGNOSIS — I6523 Occlusion and stenosis of bilateral carotid arteries: Secondary | ICD-10-CM | POA: Insufficient documentation

## 2023-01-05 DIAGNOSIS — I739 Peripheral vascular disease, unspecified: Secondary | ICD-10-CM | POA: Diagnosis not present

## 2023-01-05 DIAGNOSIS — J449 Chronic obstructive pulmonary disease, unspecified: Secondary | ICD-10-CM | POA: Insufficient documentation

## 2023-01-05 DIAGNOSIS — I2510 Atherosclerotic heart disease of native coronary artery without angina pectoris: Secondary | ICD-10-CM | POA: Diagnosis not present

## 2023-01-05 DIAGNOSIS — I723 Aneurysm of iliac artery: Secondary | ICD-10-CM | POA: Diagnosis not present

## 2023-01-05 DIAGNOSIS — N183 Chronic kidney disease, stage 3 unspecified: Secondary | ICD-10-CM | POA: Diagnosis not present

## 2023-01-05 DIAGNOSIS — E785 Hyperlipidemia, unspecified: Secondary | ICD-10-CM | POA: Insufficient documentation

## 2023-01-05 DIAGNOSIS — I10 Essential (primary) hypertension: Secondary | ICD-10-CM | POA: Insufficient documentation

## 2023-01-05 NOTE — Progress Notes (Addendum)
Office Visit    Patient Name: William [name]. Date of Encounter: 01/05/2023 PCP:  [name], NP [facility]  Cardiologist:  Nona Dell, MD  Advanced Practice Provider:  No care team member to display Electrophysiologist:  None   Chief Complaint    William [name]. is a 82 y.o. male with a hx of CAD s/p CABG, carotid artery disease, hyperlipidemia, hypertension, PAD, COPD, history of iliac artery aneurysm, CKD, and history of monoclonal gammopathy, who presents today for scheduled follow-up. Previous cardiovascular history of multivessel CAD, s/p CABG in 2017 with LIMA-LAD, SVG-OM1.  History of subsequent graft disease, underwent DES x 3 overlapping within proximal to distal RCA in 2022, limited revascularization options.  Underwent artery grafting with femoral and iliac angiograms in January 2024, closely followed by Dr. Durwin Nora.  Last seen by Dr. Diona Browner on June 18, 2022 for routine visit and preoperative cardiac evaluation.  Was being considered for revascularization to include right common femoral artery endarterectomy and possible profundoplasty, presumably under general anesthesia.  Patient noted stable angina and NYHA class II-III dyspnea.  Brilinta reduced to 60 mg twice daily.  Today presents for scheduled follow-up.  Says he is doing the same since I last saw him in the office on September 18, 2022.  Continues to note chronic, stable DOE.  He says he has some confusion whether or not he has COPD.  He was told in the past by his pulmonologist that he does not have COPD. Denies any chest pain, palpitations, syncope, presyncope, dizziness, orthopnea, PND, swelling or significant weight changes, acute bleeding, or claudication.   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  He enjoys going to car shows in his free time.   EKGs/Labs/Other Studies Reviewed:   The following studies were reviewed today:   EKG:   EKG  Interpretation Date/Time:  Monday January 05 2023 09:58:55 EDT Ventricular Rate:  63 PR Interval:  152 QRS Duration:  82 QT Interval:  394 QTC Calculation: 403 R Axis:   66  Text Interpretation: Normal sinus rhythm Nonspecific ST abnormality When compared with ECG of 22-Dec-2020 04:35, No significant change was found Confirmed by Sharlene Dory [phone]) on 01/05/2023 10:07:22 AM   Carotid duplex 11/2022: Right carotid: Velocities in right ICA are consistent with 80 to 99% stenosis.  ECA appears > 50% stenosed.   Left carotid: Velocities in the left ICA are consistent with a 1 to 39% stenosis.  Vertebrals: Right vertebral artery demonstrates antegrade flow.  Left vertebral artery demonstrates retrograde flow.  Subclavian's: At normal flow hemodynamics are seen in the right subclavian artery.  Left subclavian flow is monophasic.  ABI's 11/2022: Summary:  Right: Resting right ankle-brachial index indicates critical limb  ischemia. The right toe-brachial index is abnormal.   Left: Resting left ankle-brachial index indicates severe left lower  extremity arterial disease. The left toe-brachial index is abnormal.  Echo 10/2022: 1. Left ventricular ejection fraction, by estimation, is 55 to 60%. The  left ventricle has normal function. The left ventricle has no regional  wall motion abnormalities. Left ventricular diastolic parameters are  indeterminate.   2. Right ventricular systolic function is low normal. The right  ventricular size is mildly enlarged.   3. Left atrial size was mildly dilated.   4. Mild mitral valve regurgitation.   5. The aortic valve was not well visualized. Aortic valve regurgitation  is trivial. Aortic valve sclerosis is present, with no evidence of aortic  valve stenosis.   6. The inferior vena cava is normal in size with greater than 50%  respiratory variability, suggesting right atrial pressure of 3 mmHg.  Review of Systems    All other systems reviewed and  are otherwise negative except as noted above.  Physical Exam    VS:  BP (!) 160/61 (BP Location: Right Arm, Patient Position: Sitting, Cuff Size: Normal)   Pulse 62   Ht 5\' 6"  (1.676 m)   Wt 161 lb 12.8 oz (73.4 kg)   SpO2 95%   BMI 26.12 kg/m  , BMI Body mass index is 26.12 kg/m.  Wt Readings from Last 3 Encounters:  01/05/23 161 lb 12.8 oz (73.4 kg)  01/02/23 158 lb (71.7 kg)  12/04/22 162 lb (73.5 kg)     GEN: Well nourished, well developed, in no acute distress. HEENT: normal. Neck: Supple, no JVD, carotid bruits, or masses. Cardiac: S1/S2,RRR, no murmurs, rubs, or gallops. No clubbing, cyanosis, edema.  Radials/PT 2+ and equal bilaterally.  Respiratory:  Respirations regular and unlabored, clear to auscultation bilaterally. MS: No deformity or atrophy. Skin: Warm and dry, no rash. Neuro:  Strength and sensation are intact. Psych: Normal affect.  Assessment & Plan    CAD s/p CABG Stable with no anginal symptoms. No indication for ischemic evaluation. Continue Aspirin, Brilinta, Zetia, Imdur, Lopressor, rosuvastatin, valsartan, and NTG PRN. Heart healthy diet and regular cardiovascular exercise as tolerated encouraged.   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 Underwent arteriography 03/2022, revealed moderate left renal artery stenosis, Dr. Edilia Bo advised best option would be for revascularization via right common femoral artery endarterectomy and possible profundoplasty.  Recent vascular ultrasound studies as seen above.  Pt spoke with Dr. Edilia Bo with [facility] on December 18, 2022.  Dr. Edilia Bo stated that his carotid stenosis does not appear to be surgically accessible.  2 options included continued maximal medical therapy versus consideration for TCAR under local anesthesia.  Was felt that the plaque was heavily calcified and not a candidate for TCAR. Denies any  issues/symptoms at this time. Continue to follow-up with Dr. Edilia Bo. No medication changes at this time.   3. Hypertension BP elevated in office today, however BP home readings are at goal. Discussed to monitor BP at home at least 2 hours after medications and sitting for 5-10 minutes.  Given BP log and salty 6 diet sheet.  Continue current medication regimen.  Discussed SBP goal is less than 140.  He understands that he will contact our office if his SBP remains consistently elevated  > 140. Would trial restarting low dose Norvasc if that were the case. Heart healthy diet and regular cardiovascular exercise encouraged.   COPD, DOE Stable, chronic DOE.  Discussed diagnosis of COPD with Dr. Sherene Sires in 2017.  Recent echocardiogram report as seen above was benign.  Continue current medication regimen and continue to follow-up with PCP.   History of iliac artery aneurysm  Denies any issues. Continue to follow-up with [facility].   CKD stage 3 Most recent labs revealed sCr at 1.35 (stable) with eGFR at 53. Avoid nephrotoxic agents. Will be getting labs with Hem/Onc. Continue to follow with PCP.   Disposition: Follow up in 6 month(s) with Nona Dell, MD or APP.  Signed, Sharlene Dory, NP 01/05/2023, 10:31 AM [facility]

## 2023-01-05 NOTE — Patient Instructions (Signed)
Medication Instructions:  Your physician recommends that you continue on your current medications as directed. Please refer to the Current Medication list given to you today.  Complete Blood Pressure Log   *If you need a refill on your cardiac medications before your next appointment, please call your pharmacy*   Lab Work: NONE   If you have labs (blood work) drawn today and your tests are completely normal, you will receive your results only by: MyChart Message (if you have MyChart) OR A paper copy in the mail If you have any lab test that is abnormal or we need to change your treatment, we will call you to review the results.   Testing/Procedures: NONE    Follow-Up: At [facility], you and your health needs are our priority.  As part of our continuing mission to provide you with exceptional heart care, we have created designated Provider Care Teams.  These Care Teams include your primary Cardiologist (physician) and Advanced Practice Providers (APPs -  Physician Assistants and Nurse Practitioners) who all work together to provide you with the care you need, when you need it.  We recommend signing up for the patient portal called "MyChart".  Sign up information is provided on this After Visit Summary.  MyChart is used to connect with patients for Virtual Visits (Telemedicine).  Patients are able to view lab/test results, encounter notes, upcoming appointments, etc.  Non-urgent messages can be sent to your provider as well.   To learn more about what you can do with MyChart, go to [email or web address].    Your next appointment:   6 month(s)  Provider:   You may see Nona Dell, MD or the following Advanced Practice Provider on your designated Care Team:   Sharlene Dory, NP    Other Instructions Thank you for choosing [facility]!

## 2023-01-07 DIAGNOSIS — Z23 Encounter for immunization: Secondary | ICD-10-CM | POA: Diagnosis not present

## 2023-01-31 ENCOUNTER — Other Ambulatory Visit: Payer: Self-pay | Admitting: Internal Medicine

## 2023-02-02 DIAGNOSIS — N1832 Chronic kidney disease, stage 3b: Secondary | ICD-10-CM | POA: Diagnosis not present

## 2023-02-09 DIAGNOSIS — D631 Anemia in chronic kidney disease: Secondary | ICD-10-CM | POA: Diagnosis not present

## 2023-02-09 DIAGNOSIS — E872 Acidosis, unspecified: Secondary | ICD-10-CM | POA: Diagnosis not present

## 2023-02-09 DIAGNOSIS — E559 Vitamin D deficiency, unspecified: Secondary | ICD-10-CM | POA: Diagnosis not present

## 2023-02-09 DIAGNOSIS — I129 Hypertensive chronic kidney disease with stage 1 through stage 4 chronic kidney disease, or unspecified chronic kidney disease: Secondary | ICD-10-CM | POA: Diagnosis not present

## 2023-02-09 DIAGNOSIS — N1832 Chronic kidney disease, stage 3b: Secondary | ICD-10-CM | POA: Diagnosis not present

## 2023-02-09 DIAGNOSIS — D472 Monoclonal gammopathy: Secondary | ICD-10-CM | POA: Diagnosis not present

## 2023-02-09 DIAGNOSIS — I739 Peripheral vascular disease, unspecified: Secondary | ICD-10-CM | POA: Diagnosis not present

## 2023-02-09 DIAGNOSIS — I2510 Atherosclerotic heart disease of native coronary artery without angina pectoris: Secondary | ICD-10-CM | POA: Diagnosis not present

## 2023-02-09 DIAGNOSIS — Z23 Encounter for immunization: Secondary | ICD-10-CM | POA: Diagnosis not present

## 2023-03-04 ENCOUNTER — Other Ambulatory Visit: Payer: Self-pay | Admitting: Cardiology

## 2023-03-05 ENCOUNTER — Inpatient Hospital Stay: Payer: Medicare Other | Attending: Hematology

## 2023-03-05 DIAGNOSIS — Z79899 Other long term (current) drug therapy: Secondary | ICD-10-CM | POA: Diagnosis not present

## 2023-03-05 DIAGNOSIS — Z7982 Long term (current) use of aspirin: Secondary | ICD-10-CM | POA: Diagnosis not present

## 2023-03-05 DIAGNOSIS — Z87891 Personal history of nicotine dependence: Secondary | ICD-10-CM | POA: Diagnosis not present

## 2023-03-05 DIAGNOSIS — E611 Iron deficiency: Secondary | ICD-10-CM

## 2023-03-05 DIAGNOSIS — D472 Monoclonal gammopathy: Secondary | ICD-10-CM | POA: Diagnosis not present

## 2023-03-05 LAB — CBC WITH DIFFERENTIAL/PLATELET
Abs Immature Granulocytes: 0.02 10*3/uL (ref 0.00–0.07)
Basophils Absolute: 0 10*3/uL (ref 0.0–0.1)
Basophils Relative: 1 %
Eosinophils Absolute: 0.6 10*3/uL — ABNORMAL HIGH (ref 0.0–0.5)
Eosinophils Relative: 8 %
HCT: 43.2 % (ref 39.0–52.0)
Hemoglobin: 13.7 g/dL (ref 13.0–17.0)
Immature Granulocytes: 0 %
Lymphocytes Relative: 14 %
Lymphs Abs: 1 10*3/uL (ref 0.7–4.0)
MCH: 30.8 pg (ref 26.0–34.0)
MCHC: 31.7 g/dL (ref 30.0–36.0)
MCV: 97.1 fL (ref 80.0–100.0)
Monocytes Absolute: 0.7 10*3/uL (ref 0.1–1.0)
Monocytes Relative: 10 %
Neutro Abs: 4.9 10*3/uL (ref 1.7–7.7)
Neutrophils Relative %: 67 %
Platelets: 281 10*3/uL (ref 150–400)
RBC: 4.45 MIL/uL (ref 4.22–5.81)
RDW: 14.1 % (ref 11.5–15.5)
WBC: 7.3 10*3/uL (ref 4.0–10.5)
nRBC: 0 % (ref 0.0–0.2)

## 2023-03-05 LAB — COMPREHENSIVE METABOLIC PANEL
ALT: 19 U/L (ref 0–44)
AST: 24 U/L (ref 15–41)
Albumin: 3.4 g/dL — ABNORMAL LOW (ref 3.5–5.0)
Alkaline Phosphatase: 44 U/L (ref 38–126)
Anion gap: 9 (ref 5–15)
BUN: 26 mg/dL — ABNORMAL HIGH (ref 8–23)
CO2: 25 mmol/L (ref 22–32)
Calcium: 9.3 mg/dL (ref 8.9–10.3)
Chloride: 107 mmol/L (ref 98–111)
Creatinine, Ser: 1.15 mg/dL (ref 0.61–1.24)
GFR, Estimated: >60 mL/min (ref >60)
Glucose, Bld: 86 mg/dL (ref 70–99)
Potassium: 3.9 mmol/L (ref 3.5–5.1)
Sodium: 141 mmol/L (ref 135–145)
Total Bilirubin: 0.6 mg/dL (ref <1.2)
Total Protein: 6.7 g/dL (ref 6.5–8.1)

## 2023-03-05 LAB — LACTATE DEHYDROGENASE: LDH: 145 U/L (ref 98–192)

## 2023-03-06 LAB — KAPPA/LAMBDA LIGHT CHAINS
Kappa free light chain: 49.5 mg/L — ABNORMAL HIGH (ref 3.3–19.4)
Kappa, lambda light chain ratio: 1.6 (ref 0.26–1.65)
Lambda free light chains: 31 mg/L — ABNORMAL HIGH (ref 5.7–26.3)

## 2023-03-10 LAB — PROTEIN ELECTROPHORESIS, SERUM
A/G Ratio: 1.1 (ref 0.7–1.7)
Albumin ELP: 3.3 g/dL (ref 2.9–4.4)
Alpha-1-Globulin: 0.2 g/dL (ref 0.0–0.4)
Alpha-2-Globulin: 0.8 g/dL (ref 0.4–1.0)
Beta Globulin: 0.8 g/dL (ref 0.7–1.3)
Gamma Globulin: 1 g/dL (ref 0.4–1.8)
Globulin, Total: 2.9 g/dL (ref 2.2–3.9)
M-Spike, %: 0.3 g/dL — ABNORMAL HIGH
Total Protein ELP: 6.2 g/dL (ref 6.0–8.5)

## 2023-03-10 NOTE — Progress Notes (Signed)
[facility] [address], [place] [address]    Clinic Day:  03/11/2023  Referring physician: [name], NP  Patient Care Team: [name], NP as PCP - General (Nurse Practitioner) Jonelle Sidle, MD as PCP - Cardiology (Cardiology) Chuck Hint, MD (Inactive) as Consulting Physician (Vascular Surgery) [place] as Consulting Physician (Optometry)   ASSESSMENT & PLAN:   Assessment: 1.  Monoclonal gammopathy: - Labs done by Dr. Ronalee Belts on 05/08/2022, SPEP showed 0.3 g of M spike. 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 - Creatinine 1.3, calcium 10.4 mildly elevated. - Immunofixation: IgM kappa.  Kappa light chains 47.4, ratio 1.81.  Beta-2 microglobulin 2.9.  LDH 148. The skeletal survey (06/03/2022): No evidence of lytic lesions.  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   2.  Social/family history: - Lives by himself and is independent of ADLs and IADLs.  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  Smoked 2 packs of cigarettes per day for 20 years and quit smoking 20 years ago. - Mother had metastatic sarcoma.  Sister had lung cancer.    Plan: 1.  IgM kappa monoclonal gammopathy: - He does not report any B symptoms in the last 6 months.  No new onset bone pains reported. - Reviewed labs from 03/05/2023: LFTs and creatinine are normal.  Calcium is normal.  CBC was normal.  M spike is 0.3 g and stable.  FLC ratio is normal at 1.6.  Kappa light chains are 49.5, slightly increased from 41 at last visit. - No indication for treatment as no "crab" features.  Recommend follow-up in 6 months with repeat myeloma labs.  Will also repeat skeletal survey prior to next visit.   2.  Mild hypercalcemia: - Continue vitamin D 1000 units daily.  Will plan on repeating vitamin D at next visit.  Calcium is normal at 9.3.    Orders Placed  This Encounter  Procedures   DG Bone Survey Met    Standing Status:   Future    Standing Expiration Date:   03/10/2024    Order Specific Question:   Reason for Exam (SYMPTOM  OR DIAGNOSIS REQUIRED)    Answer:   MGUS    Order Specific Question:   Preferred imaging location?    Answer:   [facility]   CBC with Differential    Standing Status:   Future    Standing Expiration Date:   03/10/2024   Comprehensive metabolic panel    Standing Status:   Future    Standing Expiration Date:   03/10/2024   Kappa/lambda light chains    Standing Status:   Future    Standing Expiration Date:   03/10/2024   Protein electrophoresis, serum    Standing Status:   Future    Standing Expiration Date:   03/10/2024   VITAMIN D 25 Hydroxy (Vit-D Deficiency, Fractures)    Standing Status:   Future    Standing Expiration Date:   03/10/2024      I,Katie Daubenspeck,acting as a scribe for Doreatha Massed, MD.,have documented all relevant documentation on the behalf of Doreatha Massed, MD,as directed by  Doreatha Massed, MD while in the presence of Doreatha Massed, MD.   I, Doreatha Massed MD, have reviewed the above documentation for accuracy and completeness, and I agree with the above.   Doreatha Massed,  MD   12/11/20244:37 PM  CHIEF COMPLAINT:   Diagnosis: Monoclonal gammopathy   Current Therapy:  Observation    HISTORY OF PRESENT ILLNESS:   Oncology History   No history exists.     INTERVAL HISTORY:   William [name] is a 82 y.o. male presenting to clinic today for follow up of Monoclonal gammopathy. He was last seen by me on 11/04/22.  Today, he states that he is doing well overall. His appetite level is at 85%. His energy level is at 80%.  PAST MEDICAL HISTORY:   Past Medical History: Past Medical History:  Diagnosis Date   Carotid artery disease (HCC)    Colon polyp    COPD (chronic obstructive pulmonary disease) (HCC)    Coronary artery disease     Multivessel status post CABG with LIMA to LAD and SVG to OM1 2017   Hyperlipidemia    Hypertension    Iliac artery aneurysm (HCC)    Myocardial infarction (HCC)    Peripheral arterial disease (HCC)    Suboptimal revascularization options    Surgical History: Past Surgical History:  Procedure Laterality Date   ABDOMINAL AORTOGRAM W/LOWER EXTREMITY N/A 08/21/2017   Procedure: ABDOMINAL AORTOGRAM W/LOWER EXTREMITY;  Surgeon: Chuck Hint, MD;  Location: [facility] INVASIVE CV LAB;  Service: Cardiovascular;  Laterality: N/A;   ABDOMINAL AORTOGRAM W/LOWER EXTREMITY N/A 04/25/2022   Procedure: ABDOMINAL AORTOGRAM W/LOWER EXTREMITY;  Surgeon: Chuck Hint, MD;  Location: [facility] INVASIVE CV LAB;  Service: Cardiovascular;  Laterality: N/A;   Bilateral renal  artery stenoses  04/23/2009   CARDIAC CATHETERIZATION N/A 05/21/2015   Procedure: Left Heart Cath and Coronary Angiography;  Surgeon: Runell Gess, MD;  Location: [facility] INVASIVE CV LAB;  Service: Cardiovascular;  Laterality: N/A;   CORONARY ARTERY BYPASS GRAFT N/A 05/22/2015   Procedure: CORONARY ARTERY BYPASS GRAFTING (CABG) LIMA-LAD and SVG-OM EVH RIGHT THIGH GREATER SAPHENOUS VEIN;  Surgeon: Delight Ovens, MD;  Location: [facility] OR;  Service: Open Heart Surgery;  Laterality: N/A;   CORONARY ATHERECTOMY N/A 12/21/2020   Procedure: CORONARY ATHERECTOMY;  Surgeon: Kathleene Hazel, MD;  Location: [facility] INVASIVE CV LAB;  Service: Cardiovascular;  Laterality: N/A;   CORONARY BALLOON ANGIOPLASTY N/A 12/06/2020   Procedure: CORONARY BALLOON ANGIOPLASTY;  Surgeon: Kathleene Hazel, MD;  Location: [facility] INVASIVE CV LAB;  Service: Cardiovascular;  Laterality: N/A;   CORONARY STENT INTERVENTION N/A 12/21/2020   Procedure: CORONARY STENT INTERVENTION;  Surgeon: Kathleene Hazel, MD;  Location: [facility] INVASIVE CV LAB;  Service: Cardiovascular;  Laterality: N/A;   CORONARY ULTRASOUND/IVUS N/A 12/21/2020   Procedure: Intravascular  Ultrasound/IVUS;  Surgeon: Kathleene Hazel, MD;  Location: [facility] INVASIVE CV LAB;  Service: Cardiovascular;  Laterality: N/A;   HIATAL HERNIA REPAIR     LEFT HEART CATH AND CORS/GRAFTS ANGIOGRAPHY N/A 12/06/2020   Procedure: LEFT HEART CATH AND CORS/GRAFTS ANGIOGRAPHY;  Surgeon: Kathleene Hazel, MD;  Location: [facility] INVASIVE CV LAB;  Service: Cardiovascular;  Laterality: N/A;   Percutaneous translumnial angioplasty  04/23/2009   Catheterization of Lefst external iliac artery with Left lower extremity runoff   TEE WITHOUT CARDIOVERSION N/A 05/22/2015   Procedure: TRANSESOPHAGEAL ECHOCARDIOGRAM (TEE);  Surgeon: Delight Ovens, MD;  Location: [facility] OR;  Service: Open Heart Surgery;  Laterality: N/A;    Social History: Social History   Socioeconomic History   Marital status: Widowed    Spouse name: Not on file   Number of children: 2   Years of education: Not on file   Highest  education level: Not on file  Occupational History   Occupation: Retired  Tobacco Use   Smoking status: Former    Current packs/day: 0.00    Average packs/day: 2.0 packs/day for 30.5 years (60.9 ttl pk-yrs)    Types: Cigarettes    Start date: 06/03/1961    Quit date: 11/17/1991    Years since quitting: 31.3    Passive exposure: Never   Smokeless tobacco: Former    Types: Snuff, Chew  Vaping Use   Vaping status: Never Used  Substance and Sexual Activity   Alcohol use: Not Currently    Alcohol/week: 2.0 - 4.0 standard drinks of alcohol    Types: 2 - 4 Standard drinks or equivalent per week    Comment: used to, quit 2012   Drug use: No   Sexual activity: Not Currently  Other Topics Concern   Not on file  Social History Narrative   Retired used to work in [employer]. Widower. Lives alone.   Social Determinants of Health   Financial Resource Strain: Low Risk  (01/02/2023)   Overall Financial Resource Strain (CARDIA)    Difficulty of Paying Living Expenses: Not hard at all  Food Insecurity: No Food  Insecurity (01/02/2023)   Hunger Vital Sign    Worried About Running Out of Food in the Last Year: Never true    Ran Out of Food in the Last Year: Never true  Transportation Needs: No Transportation Needs (01/02/2023)   PRAPARE - Administrator, Civil Service (Medical): No    Lack of Transportation (Non-Medical): No  Physical Activity: Inactive (01/02/2023)   Exercise Vital Sign    Days of Exercise per Week: 0 days    Minutes of Exercise per Session: 0 min  Stress: No Stress Concern Present (01/02/2023)   [employer] of Occupational Health - Occupational Stress Questionnaire    Feeling of Stress : Not at all  Social Connections: Moderately Isolated (01/02/2023)   Social Connection and Isolation Panel [NHANES]    Frequency of Communication with Friends and Family: Once a week    Frequency of Social Gatherings with Friends and Family: More than three times a week    Attends Religious Services: Never    Database administrator or Organizations: Yes    Attends Engineer, structural: More than 4 times per year    Marital Status: Widowed  Intimate Partner Violence: Patient Unable To Answer (01/02/2023)   Humiliation, Afraid, Rape, and Kick questionnaire    Fear of Current or Ex-Partner: Patient unable to answer    Emotionally Abused: Patient unable to answer    Physically Abused: Patient unable to answer    Sexually Abused: Patient unable to answer    Family History: Family History  Problem Relation Age of Onset   Heart disease Father        Heart Disease before age 75   Pulmonary embolism Father    Deep vein thrombosis Father    Cancer Mother        Sarcoma    Current Medications:  Current Outpatient Medications:    amLODipine (NORVASC) 5 MG tablet, Take 2 tablets (10 mg total) by mouth daily., Disp: 90 tablet, Rfl: 3   Ascorbic Acid (VITAMIN C) 1000 MG tablet, Take 1,000 mg by mouth in the morning., Disp: , Rfl:    aspirin EC 81 MG tablet, Take 1 tablet  (81 mg total) by mouth daily., Disp: , Rfl:    BRILINTA 60 MG TABS  tablet, TAKE 1 TABLET BY MOUTH 2 TIMES DAILY., Disp: 60 tablet, Rfl: 6   budesonide-formoterol (SYMBICORT) 80-4.5 MCG/ACT inhaler, Take 2 puffs first thing in am and then another 2 puffs about 12 hours later., Disp: 1 each, Rfl: 11   cholecalciferol (VITAMIN D) 25 MCG (1000 UNIT) tablet, Take 1,000 Units by mouth in the morning., Disp: , Rfl:    ezetimibe (ZETIA) 10 MG tablet, TAKE 1 TABLET BY MOUTH EVERY DAY, Disp: 90 tablet, Rfl: 3   famotidine (PEPCID) 20 MG tablet, Take 20 mg by mouth every evening., Disp: , Rfl:    fish oil-omega-3 fatty acids 1000 MG capsule, Take 1,000 mg by mouth in the morning., Disp: , Rfl:    folic acid (FOLVITE) 400 MCG tablet, Take 400 mcg by mouth in the morning., Disp: , Rfl:    Garlic 2000 MG TBEC, Take 2,000 mg by mouth in the morning., Disp: , Rfl:    isosorbide mononitrate (IMDUR) 60 MG 24 hr tablet, TAKE 1 TABLET BY MOUTH EVERY DAY, Disp: 90 tablet, Rfl: 1   metoprolol tartrate (LOPRESSOR) 50 MG tablet, TAKE 1 TABLET BY MOUTH TWICE A DAY, Disp: 180 tablet, Rfl: 1   Multiple Vitamin (MULTIVITAMIN WITH MINERALS) TABS tablet, Take 1 tablet by mouth in the morning., Disp: , Rfl:    nitroGLYCERIN (NITROSTAT) 0.4 MG SL tablet, Place 1 tablet (0.4 mg total) under the tongue every 5 (five) minutes as needed for chest pain. If pain does not resolve with rest., Disp: 50 tablet, Rfl: 3   NP THYROID 30 MG tablet, TAKE 1 TABLET (30 MG TOTAL) BY MOUTH DAILY BEFORE BREAKFAST, Disp: 90 tablet, Rfl: 1   omeprazole (PRILOSEC OTC) 20 MG tablet, Take 1 tablet (20 mg total) by mouth daily. Take 30-60 min before first meal of the day, Disp: , Rfl:    rosuvastatin (CRESTOR) 10 MG tablet, TAKE 1 TABLET BY MOUTH EVERY DAY, Disp: 90 tablet, Rfl: 3   sodium bicarbonate 650 MG tablet, Take 650 mg by mouth 2 (two) times daily., Disp: , Rfl:    thiamine (VITAMIN B-1) 100 MG tablet, Take 100 mg by mouth every evening. , Disp: ,  Rfl:    traMADol (ULTRAM) 50 MG tablet, Take 1 tablet (50 mg total) by mouth every 6 (six) hours as needed., Disp: 16 tablet, Rfl: 0   valsartan (DIOVAN) 160 MG tablet, Take 1 tablet (160 mg total) by mouth daily. NEEDS APPT FOR FURTHER REFILLS, Disp: 30 tablet, Rfl: 0   Allergies: No Known Allergies  REVIEW OF SYSTEMS:   Review of Systems  Constitutional:  Negative for chills, fatigue and fever.  HENT:   Negative for lump/mass, mouth sores, nosebleeds, sore throat and trouble swallowing.   Eyes:  Negative for eye problems.  Respiratory:  Positive for shortness of breath. Negative for cough.   Cardiovascular:  Negative for chest pain, leg swelling and palpitations.  Gastrointestinal:  Negative for abdominal pain, constipation, diarrhea, nausea and vomiting.  Genitourinary:  Negative for bladder incontinence, difficulty urinating, dysuria, frequency, hematuria and nocturia.   Musculoskeletal:  Negative for arthralgias, back pain, flank pain, myalgias and neck pain.  Skin:  Negative for itching and rash.  Neurological:  Negative for dizziness, headaches and numbness.  Hematological:  Does not bruise/bleed easily.  Psychiatric/Behavioral:  Positive for sleep disturbance. Negative for depression and suicidal ideas. The patient is not nervous/anxious.   All other systems reviewed and are negative.    VITALS:   Blood pressure (!) 169/56, pulse  68, temperature (!) 97.4 F (36.3 C), temperature source Tympanic, resp. rate 20, weight 161 lb 3.2 oz (73.1 kg), SpO2 97%.  Wt Readings from Last 3 Encounters:  03/11/23 161 lb 3.2 oz (73.1 kg)  01/05/23 161 lb 12.8 oz (73.4 kg)  01/02/23 158 lb (71.7 kg)    Body mass index is 26.02 kg/m.  Performance status (ECOG): 1 - Symptomatic but completely ambulatory  PHYSICAL EXAM:   Physical Exam Vitals and nursing note reviewed. Exam conducted with a chaperone present.  Constitutional:      Appearance: Normal appearance.  Cardiovascular:      Rate and Rhythm: Normal rate and regular rhythm.     Pulses: Normal pulses.     Heart sounds: Normal heart sounds.  Pulmonary:     Effort: Pulmonary effort is normal.     Breath sounds: Normal breath sounds.  Abdominal:     Palpations: Abdomen is soft. There is no hepatomegaly, splenomegaly or mass.     Tenderness: There is no abdominal tenderness.  Musculoskeletal:     Right lower leg: No edema.     Left lower leg: No edema.  Lymphadenopathy:     Cervical: No cervical adenopathy.     Right cervical: No superficial, deep or posterior cervical adenopathy.    Left cervical: No superficial, deep or posterior cervical adenopathy.     Upper Body:     Right upper body: No supraclavicular or axillary adenopathy.     Left upper body: No supraclavicular or axillary adenopathy.  Neurological:     General: No focal deficit present.     Mental Status: He is alert and oriented to person, place, and time.  Psychiatric:        Mood and Affect: Mood normal.        Behavior: Behavior normal.     LABS:      Latest Ref Rng & Units 03/05/2023   10:59 AM 10/28/2022    2:44 PM 04/25/2022    8:49 AM  CBC  WBC 4.0 - 10.5 K/uL 7.3  7.9    Hemoglobin 13.0 - 17.0 g/dL 16.1  09.6  04.5   Hematocrit 39.0 - 52.0 % 43.2  38.2  38.0   Platelets 150 - 400 K/uL 281  289        Latest Ref Rng & Units 03/05/2023   10:59 AM 10/28/2022    2:44 PM 06/03/2022   10:16 AM  CMP  Glucose 70 - 99 mg/dL 86  98    BUN 8 - 23 mg/dL 26  29    Creatinine 4.09 - 1.24 mg/dL 8.11  9.14    Sodium 782 - 145 mmol/L 141  137    Potassium 3.5 - 5.1 mmol/L 3.9  4.0    Chloride 98 - 111 mmol/L 107  112    CO2 22 - 32 mmol/L 25  20    Calcium 8.9 - 10.3 mg/dL 9.3  8.7  9.7   Total Protein 6.5 - 8.1 g/dL 6.7  6.6    Total Bilirubin <1.2 mg/dL 0.6  0.5    Alkaline Phos 38 - 126 U/L 44  46    AST 15 - 41 U/L 24  30    ALT 0 - 44 U/L 19  24       No results found for: "CEA1", "CEA" / No results found for: "CEA1", "CEA" No  results found for: "PSA1" No results found for: "NFA213" No results found for: "YQM578"  Lab  Results  Component Value Date   TOTALPROTELP 6.2 03/05/2023   ALBUMINELP 3.3 03/05/2023   A1GS 0.2 03/05/2023   A2GS 0.8 03/05/2023   BETS 0.8 03/05/2023   GAMS 1.0 03/05/2023   MSPIKE 0.3 (H) 03/05/2023   SPEI Comment 03/05/2023   Lab Results  Component Value Date   TIBC 337 10/28/2022   TIBC 403 06/03/2022   FERRITIN 46 10/28/2022   FERRITIN 44 06/03/2022   IRONPCTSAT 23 10/28/2022   IRONPCTSAT 26 06/03/2022   Lab Results  Component Value Date   LDH 145 03/05/2023   LDH 148 06/03/2022     STUDIES:   No results found.

## 2023-03-11 ENCOUNTER — Inpatient Hospital Stay (HOSPITAL_BASED_OUTPATIENT_CLINIC_OR_DEPARTMENT_OTHER): Payer: Medicare Other | Admitting: Hematology

## 2023-03-11 VITALS — BP 169/56 | HR 68 | Temp 97.4°F | Resp 20 | Wt 161.2 lb

## 2023-03-11 DIAGNOSIS — E559 Vitamin D deficiency, unspecified: Secondary | ICD-10-CM | POA: Diagnosis not present

## 2023-03-11 DIAGNOSIS — D472 Monoclonal gammopathy: Secondary | ICD-10-CM | POA: Diagnosis not present

## 2023-03-11 DIAGNOSIS — Z79899 Other long term (current) drug therapy: Secondary | ICD-10-CM | POA: Diagnosis not present

## 2023-03-11 DIAGNOSIS — Z87891 Personal history of nicotine dependence: Secondary | ICD-10-CM | POA: Diagnosis not present

## 2023-03-11 DIAGNOSIS — Z7982 Long term (current) use of aspirin: Secondary | ICD-10-CM | POA: Diagnosis not present

## 2023-03-11 NOTE — Patient Instructions (Addendum)
[facility] at [facility] Discharge Instructions   You were seen and examined today by Dr. Ellin Saba.  He reviewed the results of your lab work. All results are normal/stable.   We will see you back in 6 months. We will repeat lab work prior to this visit.   Return as scheduled.    Thank you for choosing [facility] at [facility] to provide your oncology and hematology care.  To afford each patient quality time with our provider, please arrive at least 15 minutes before your scheduled appointment time.   If you have a lab appointment with the Cancer Center please come in thru the Main Entrance and check in at the main information desk.  You need to re-schedule your appointment should you arrive 10 or more minutes late.  We strive to give you quality time with our providers, and arriving late affects you and other patients whose appointments are after yours.  Also, if you no show three or more times for appointments you may be dismissed from the clinic at the providers discretion.     Again, thank you for choosing [facility].  Our hope is that these requests will decrease the amount of time that you wait before being seen by our physicians.       _____________________________________________________________  Should you have questions after your visit to [facility], please contact our office at [phone] and follow the prompts.  Our office hours are 8:00 a.m. and 4:30 p.m. Monday - Friday.  Please note that voicemails left after 4:00 p.m. may not be returned until the following business day.  We are closed weekends and major holidays.  You do have access to a nurse 24-7, just call the main number to the clinic [phone] and do not press any options, hold on the line and a nurse will answer the phone.    For prescription refill requests, have your pharmacy contact our office and allow 72 hours.    Due to Covid,  you will need to wear a mask upon entering the hospital. If you do not have a mask, a mask will be given to you at the Main Entrance upon arrival. For doctor visits, patients may have 1 support person age 62 or older with them. For treatment visits, patients can not have anyone with them due to social distancing guidelines and our immunocompromised population.

## 2023-04-16 ENCOUNTER — Ambulatory Visit: Payer: Medicare Other | Admitting: Nurse Practitioner

## 2023-05-06 ENCOUNTER — Ambulatory Visit: Payer: Medicare Other | Admitting: Nurse Practitioner

## 2023-05-06 VITALS — BP 120/84 | HR 74 | Temp 98.1°F | Ht 66.0 in | Wt 161.0 lb

## 2023-05-06 DIAGNOSIS — I25118 Atherosclerotic heart disease of native coronary artery with other forms of angina pectoris: Secondary | ICD-10-CM | POA: Diagnosis not present

## 2023-05-06 DIAGNOSIS — E785 Hyperlipidemia, unspecified: Secondary | ICD-10-CM

## 2023-05-06 DIAGNOSIS — R5383 Other fatigue: Secondary | ICD-10-CM | POA: Diagnosis not present

## 2023-05-06 DIAGNOSIS — I10 Essential (primary) hypertension: Secondary | ICD-10-CM | POA: Diagnosis not present

## 2023-05-06 LAB — LIPID PANEL
Cholesterol: 102 mg/dL (ref 0–200)
HDL: 41.6 mg/dL (ref >39.00)
LDL Cholesterol: 35 mg/dL (ref 0–99)
NonHDL: 60.07
Total CHOL/HDL Ratio: 2
Triglycerides: 127 mg/dL (ref 0.0–149.0)
VLDL: 25.4 mg/dL (ref 0.0–40.0)

## 2023-05-06 LAB — COMPREHENSIVE METABOLIC PANEL
ALT: 18 U/L (ref 0–53)
AST: 23 U/L (ref 0–37)
Albumin: 3.9 g/dL (ref 3.5–5.2)
Alkaline Phosphatase: 55 U/L (ref 39–117)
BUN: 18 mg/dL (ref 6–23)
CO2: 29 meq/L (ref 19–32)
Calcium: 9.2 mg/dL (ref 8.4–10.5)
Chloride: 103 meq/L (ref 96–112)
Creatinine, Ser: 1.18 mg/dL (ref 0.40–1.50)
GFR: 57.7 mL/min — ABNORMAL LOW (ref >60.00)
Glucose, Bld: 92 mg/dL (ref 70–99)
Potassium: 4.5 meq/L (ref 3.5–5.1)
Sodium: 140 meq/L (ref 135–145)
Total Bilirubin: 0.7 mg/dL (ref 0.2–1.2)
Total Protein: 5.7 g/dL — ABNORMAL LOW (ref 6.0–8.3)

## 2023-05-06 LAB — TSH: TSH: 1.98 u[IU]/mL (ref 0.35–5.50)

## 2023-05-06 LAB — T3, FREE: T3, Free: 3.2 pg/mL (ref 2.3–4.2)

## 2023-05-06 LAB — T4, FREE: Free T4: 0.85 ng/dL (ref 0.60–1.60)

## 2023-05-06 NOTE — Progress Notes (Signed)
 Established Patient Office Visit  Subjective   Patient ID: William [name]., male    DOB: 1940-12-10  Age: 83 y.o. MRN: [id number]  Chief Complaint  Patient presents with   Medical Management of Chronic Issues    6 month follow up, BP reading high     Patient has today for follow-up.  CAD/hypertension/hyperlipidemia: Patient has history of NSTEMI as well as peripheral vascular disease.  Currently treated with aspirin  81 mg daily, Brilinta  60 mg twice daily, Zetia  10 mg daily, fish oil 1000 mg daily, Imdur  60 mg daily, metoprolol  50 mg twice daily, rosuvastatin  10 mg daily.  He was on valsartan  160 mg daily and amlodipine  10 mg daily, but reports he was told to stop this by a previous provider.  He is not sure who recommended this.  He does monitor blood pressure at home every morning and reports first a.m. blood pressure reading systolically is 1 60-1 70, but as the day progresses will lowered to 130-140.  No chest pain, worsening shortness of breath, dizziness, headache, blurry vision.  Fatigue: He is also treated with NP thyroid  for off label treatment for his fatigue.  No palpitations reported today.    Review of Systems  Eyes:  Negative for blurred vision and double vision (reports one episode 1 month ago, but spontaneously resolved).  Cardiovascular:  Negative for chest pain and palpitations.  Neurological:  Negative for dizziness and headaches.      Objective:     BP 120/84   Pulse 74   Temp 98.1 F (36.7 C) (Temporal)   Ht 5' 6 (1.676 m)   Wt 161 lb (73 kg)   SpO2 94%   BMI 25.99 kg/m  BP Readings from Last 3 Encounters:  05/06/23 120/84  03/11/23 (!) 169/56  01/05/23 (!) 160/61   Wt Readings from Last 3 Encounters:  05/06/23 161 lb (73 kg)  03/11/23 161 lb 3.2 oz (73.1 kg)  01/05/23 161 lb 12.8 oz (73.4 kg)      Physical Exam Vitals reviewed.  Constitutional:      Appearance: Normal appearance.  HENT:     Head: Normocephalic and atraumatic.   Cardiovascular:     Rate and Rhythm: Normal rate and regular rhythm.  Pulmonary:     Effort: Pulmonary effort is normal.     Breath sounds: Normal breath sounds.  Musculoskeletal:     Cervical back: Neck supple.  Skin:    General: Skin is warm and dry.  Neurological:     Mental Status: He is alert and oriented to person, place, and time.  Psychiatric:        Mood and Affect: Mood normal.        Behavior: Behavior normal.        Thought Content: Thought content normal.        Judgment: Judgment normal.      No results found for any visits on 05/06/23.  Last lipids Lab Results  Component Value Date   CHOL 101 02/27/2022   HDL 41.40 02/27/2022   LDLCALC 43 02/27/2022   TRIG 84.0 02/27/2022   CHOLHDL 2 02/27/2022      The ASCVD Risk score (Arnett DK, et al., 2019) failed to calculate for the following reasons:   The 2019 ASCVD risk score is only valid for ages 89 to 43   Risk score cannot be calculated because patient has a medical history suggesting prior/existing ASCVD    Assessment & Plan:   Problem List  Items Addressed This Visit       Cardiovascular and Mediastinum   Essential hypertension   Chronic, stable Continue follow-up with cardiology. Continue aspirin  81 mg daily, Brilinta  60 mg twice daily, Zetia  10 mg daily, rosuvastatin  10 mg daily, fish oil 1000 mg daily, Imdur  60 mg daily, metoprolol  twice daily. Today's blood pressure reading within range.  He will monitor her blood pressure readings at home may consider getting a new blood pressure cuff, he will reach out to me with at home readings if they remain elevated.  Will consider restarting amlodipine  5 mg daily if needed.      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 Continue aspirin  81 mg daily, Brilinta  60 mg twice daily, Zetia  10 mg daily, rosuvastatin  10 mg daily, fish oil 1000 mg daily, Imdur  60 mg daily,  metoprolol  twice daily. Today's blood pressure reading within range.  He will monitor her blood pressure readings at home may consider getting a new blood pressure cuff, he will reach out to me with at home readings if they remain elevated.  Will consider restarting amlodipine  5 mg daily if needed. Collect lipid panel today, patient reports last meal was about 6 hours ago.  Last LDL 43, further recommendations may be made based upon the results.        Other   Fatigue   Chronic Check thyroid  panel, consider adjustment of NP thyroid  based on these results.      Relevant Orders   TSH   T3, free   T4, free   Other Visit Diagnoses       Hyperlipidemia, unspecified hyperlipidemia type    -  Primary   Relevant Orders   Lipid panel   Comprehensive metabolic panel       Return in about 8 months (around 01/03/2024).    Lauraine FORBES Pereyra, NP

## 2023-05-06 NOTE — Assessment & Plan Note (Addendum)
 Chronic, stable Continue follow-up with cardiology. Continue aspirin  81 mg daily, Brilinta  60 mg twice daily, Zetia  10 mg daily, rosuvastatin  10 mg daily, fish oil 1000 mg daily, Imdur  60 mg daily, metoprolol  twice daily. Today's blood pressure reading within range.  He will monitor her blood pressure readings at home may consider getting a new blood pressure cuff, he will reach out to me with at home readings if they remain elevated.  Will consider restarting amlodipine  5 mg daily if needed. Collect lipid panel today, patient reports last meal was about 6 hours ago.  Last LDL 43, further recommendations may be made based upon the results.

## 2023-05-06 NOTE — Assessment & Plan Note (Signed)
 Chronic Check thyroid  panel, consider adjustment of NP thyroid  based on these results.

## 2023-05-06 NOTE — Assessment & Plan Note (Signed)
 Chronic, stable Continue follow-up with cardiology. Continue aspirin  81 mg daily, Brilinta  60 mg twice daily, Zetia  10 mg daily, rosuvastatin  10 mg daily, fish oil 1000 mg daily, Imdur  60 mg daily, metoprolol  twice daily. Today's blood pressure reading within range.  He will monitor her blood pressure readings at home may consider getting a new blood pressure cuff, he will reach out to me with at home readings if they remain elevated.  Will consider restarting amlodipine  5 mg daily if needed.

## 2023-06-15 ENCOUNTER — Other Ambulatory Visit: Payer: Self-pay

## 2023-06-15 DIAGNOSIS — I6521 Occlusion and stenosis of right carotid artery: Secondary | ICD-10-CM

## 2023-06-21 ENCOUNTER — Encounter: Payer: Self-pay | Admitting: Nurse Practitioner

## 2023-06-24 ENCOUNTER — Encounter: Payer: Self-pay | Admitting: Vascular Surgery

## 2023-06-24 ENCOUNTER — Ambulatory Visit (HOSPITAL_COMMUNITY)
Admission: RE | Admit: 2023-06-24 | Discharge: 2023-06-24 | Disposition: A | Discharge status: Home / Self Care | Payer: Medicare Other | Source: Ambulatory Visit | Attending: Vascular Surgery | Admitting: Vascular Surgery

## 2023-06-24 ENCOUNTER — Ambulatory Visit (INDEPENDENT_AMBULATORY_CARE_PROVIDER_SITE_OTHER): Payer: Medicare Other | Admitting: Vascular Surgery

## 2023-06-24 VITALS — BP 195/81 | HR 75 | Temp 98.2°F | Wt 155.0 lb

## 2023-06-24 DIAGNOSIS — R0989 Other specified symptoms and signs involving the circulatory and respiratory systems: Secondary | ICD-10-CM

## 2023-06-24 DIAGNOSIS — I70221 Atherosclerosis of native arteries of extremities with rest pain, right leg: Secondary | ICD-10-CM

## 2023-06-24 DIAGNOSIS — I6521 Occlusion and stenosis of right carotid artery: Secondary | ICD-10-CM | POA: Insufficient documentation

## 2023-06-24 NOTE — Progress Notes (Signed)
 Patient ID: William [name]., male   DOB: 08/03/1940, 83 y.o.   MRN: [id number]  Reason for Consult: Follow-up   Referred by William Paddy, NP  Subjective:     HPI:  William [name]. is a 83 y.o. male history of right external iliac artery stent with known common femoral artery occlusion.  He also has high-grade right ICA stenosis which has been followed by Dr. Edilia Bo.  He remains on aspirin, Brilinta and statin.  He does not have any history of stroke, TIA or amaurosis.  He has been considered for TCAR given his high risk cardiopulmonary status but given the high calcification has elected for medical management.  He does have known bilateral carotid bruits.  He follows up today for evaluation of carotid stenosis.  Mostly complains of right greater than left lower extremity pain.  He denies tissue loss or ulceration.  He states that currently he can at least walk to the parking lot.  He does have pain at night which occasionally wakes him from sleep.  Other than external leg artery stenting in the past he has no previous history of vascular interventions.  Past Medical History:  Diagnosis Date   Carotid artery disease (HCC)    Colon polyp    COPD (chronic obstructive pulmonary disease) (HCC)    Coronary artery disease    Multivessel status post CABG with LIMA to LAD and SVG to OM1 2017   Hyperlipidemia    Hypertension    Iliac artery aneurysm (HCC)    Myocardial infarction (HCC)    Peripheral arterial disease (HCC)    Suboptimal revascularization options   Family History  Problem Relation Age of Onset   Heart disease Father        Heart Disease before age 45   Pulmonary embolism Father    Deep vein thrombosis Father    Cancer Mother        Sarcoma   Past Surgical History:  Procedure Laterality Date   ABDOMINAL AORTOGRAM W/LOWER EXTREMITY N/A 08/21/2017   Procedure: ABDOMINAL AORTOGRAM W/LOWER EXTREMITY;  Surgeon: Chuck Hint, MD;  Location: [facility] INVASIVE CV LAB;   Service: Cardiovascular;  Laterality: N/A;   ABDOMINAL AORTOGRAM W/LOWER EXTREMITY N/A 04/25/2022   Procedure: ABDOMINAL AORTOGRAM W/LOWER EXTREMITY;  Surgeon: Chuck Hint, MD;  Location: [facility] INVASIVE CV LAB;  Service: Cardiovascular;  Laterality: N/A;   Bilateral renal  artery stenoses  04/23/2009   CARDIAC CATHETERIZATION N/A 05/21/2015   Procedure: Left Heart Cath and Coronary Angiography;  Surgeon: Runell Gess, MD;  Location: [facility] INVASIVE CV LAB;  Service: Cardiovascular;  Laterality: N/A;   CORONARY ARTERY BYPASS GRAFT N/A 05/22/2015   Procedure: CORONARY ARTERY BYPASS GRAFTING (CABG) LIMA-LAD and SVG-OM EVH RIGHT THIGH GREATER SAPHENOUS VEIN;  Surgeon: Delight Ovens, MD;  Location: [facility] OR;  Service: Open Heart Surgery;  Laterality: N/A;   CORONARY ATHERECTOMY N/A 12/21/2020   Procedure: CORONARY ATHERECTOMY;  Surgeon: Kathleene Hazel, MD;  Location: [facility] INVASIVE CV LAB;  Service: Cardiovascular;  Laterality: N/A;   CORONARY BALLOON ANGIOPLASTY N/A 12/06/2020   Procedure: CORONARY BALLOON ANGIOPLASTY;  Surgeon: Kathleene Hazel, MD;  Location: [facility] INVASIVE CV LAB;  Service: Cardiovascular;  Laterality: N/A;   CORONARY STENT INTERVENTION N/A 12/21/2020   Procedure: CORONARY STENT INTERVENTION;  Surgeon: Kathleene Hazel, MD;  Location: [facility] INVASIVE CV LAB;  Service: Cardiovascular;  Laterality: N/A;   CORONARY ULTRASOUND/IVUS N/A 12/21/2020   Procedure: Intravascular Ultrasound/IVUS;  Surgeon: Clifton James,  Nile Dear, MD;  Location: [facility] INVASIVE CV LAB;  Service: Cardiovascular;  Laterality: N/A;   HIATAL HERNIA REPAIR     LEFT HEART CATH AND CORS/GRAFTS ANGIOGRAPHY N/A 12/06/2020   Procedure: LEFT HEART CATH AND CORS/GRAFTS ANGIOGRAPHY;  Surgeon: Kathleene Hazel, MD;  Location: [facility] INVASIVE CV LAB;  Service: Cardiovascular;  Laterality: N/A;   Percutaneous translumnial angioplasty  04/23/2009   Catheterization of Lefst external iliac artery with Left lower  extremity runoff   TEE WITHOUT CARDIOVERSION N/A 05/22/2015   Procedure: TRANSESOPHAGEAL ECHOCARDIOGRAM (TEE);  Surgeon: Delight Ovens, MD;  Location: [facility] OR;  Service: Open Heart Surgery;  Laterality: N/A;    Short Social History:  Social History   Tobacco Use   Smoking status: Former    Current packs/day: 0.00    Average packs/day: 2.0 packs/day for 30.5 years (60.9 ttl pk-yrs)    Types: Cigarettes    Start date: 06/03/1961    Quit date: 11/17/1991    Years since quitting: 31.6    Passive exposure: Never   Smokeless tobacco: Former    Types: Snuff, Chew  Substance Use Topics   Alcohol use: Not Currently    Alcohol/week: 2.0 - 4.0 standard drinks of alcohol    Types: 2 - 4 Standard drinks or equivalent per week    Comment: used to, quit 2012    No Known Allergies  Current Outpatient Medications  Medication Sig Dispense Refill   Ascorbic Acid (VITAMIN C) 1000 MG tablet Take 1,000 mg by mouth in the morning.     aspirin EC 81 MG tablet Take 1 tablet (81 mg total) by mouth daily.     BRILINTA 60 MG TABS tablet TAKE 1 TABLET BY MOUTH 2 TIMES DAILY. 60 tablet 6   budesonide-formoterol (SYMBICORT) 80-4.5 MCG/ACT inhaler Take 2 puffs first thing in am and then another 2 puffs about 12 hours later. 1 each 11   cholecalciferol (VITAMIN D) 25 MCG (1000 UNIT) tablet Take 1,000 Units by mouth in the morning.     ezetimibe (ZETIA) 10 MG tablet TAKE 1 TABLET BY MOUTH EVERY DAY 90 tablet 3   famotidine (PEPCID) 20 MG tablet Take 20 mg by mouth every evening.     fish oil-omega-3 fatty acids 1000 MG capsule Take 1,000 mg by mouth in the morning.     folic acid (FOLVITE) 400 MCG tablet Take 400 mcg by mouth in the morning.     Garlic 2000 MG TBEC Take 2,000 mg by mouth in the morning.     isosorbide mononitrate (IMDUR) 60 MG 24 hr tablet TAKE 1 TABLET BY MOUTH EVERY DAY 90 tablet 1   metoprolol tartrate (LOPRESSOR) 50 MG tablet TAKE 1 TABLET BY MOUTH TWICE A DAY 180 tablet 1   Multiple  Vitamin (MULTIVITAMIN WITH MINERALS) TABS tablet Take 1 tablet by mouth in the morning.     nitroGLYCERIN (NITROSTAT) 0.4 MG SL tablet Place 1 tablet (0.4 mg total) under the tongue every 5 (five) minutes as needed for chest pain. If pain does not resolve with rest. 50 tablet 3   NP THYROID 30 MG tablet TAKE 1 TABLET (30 MG TOTAL) BY MOUTH DAILY BEFORE BREAKFAST 90 tablet 1   omeprazole (PRILOSEC OTC) 20 MG tablet Take 1 tablet (20 mg total) by mouth daily. Take 30-60 min before first meal of the day     rosuvastatin (CRESTOR) 10 MG tablet TAKE 1 TABLET BY MOUTH EVERY DAY 90 tablet 3   sodium bicarbonate 650 MG  tablet Take 650 mg by mouth 2 (two) times daily.     thiamine (VITAMIN B-1) 100 MG tablet Take 100 mg by mouth every evening.      traMADol (ULTRAM) 50 MG tablet Take 1 tablet (50 mg total) by mouth every 6 (six) hours as needed. 16 tablet 0   amLODipine (NORVASC) 5 MG tablet Take 2 tablets (10 mg total) by mouth daily. (Patient not taking: Reported on 06/24/2023) 90 tablet 3   valsartan (DIOVAN) 160 MG tablet Take 1 tablet (160 mg total) by mouth daily. NEEDS APPT FOR FURTHER REFILLS (Patient not taking: Reported on 06/24/2023) 30 tablet 0   No current facility-administered medications for this visit.    Review of Systems  Constitutional:  Constitutional negative. HENT: HENT negative.  Eyes: Eyes negative.  Respiratory: Positive for shortness of breath.  Cardiovascular: Cardiovascular negative.  GI: Gastrointestinal negative.  Musculoskeletal: Positive for leg pain.  Neurological: Neurological negative. Hematologic: Hematologic/lymphatic negative.  Psychiatric: Psychiatric negative.        Objective:  Objective   Vitals:   06/24/23 1546 06/24/23 1548  BP: 94/60 (!) 195/81  Pulse: 75   Temp: 98.2 F (36.8 C)   SpO2: 95%   Weight: 155 lb (70.3 kg)    Body mass index is 25.02 kg/m.  Physical Exam HENT:     Head: Normocephalic.     Nose: Nose normal.  Eyes:      Pupils: Pupils are equal, round, and reactive to light.  Cardiovascular:     Rate and Rhythm: Normal rate.     Pulses:          Radial pulses are 2+ on the right side and 0 on the left side.       Femoral pulses are 0 on the right side and 1+ on the left side. Pulmonary:     Effort: Pulmonary effort is normal.  Abdominal:     General: Abdomen is flat.  Musculoskeletal:     Right lower leg: No edema.     Left lower leg: No edema.  Skin:    General: Skin is warm.     Capillary Refill: Capillary refill takes more than 3 seconds.  Neurological:     General: No focal deficit present.     Mental Status: He is alert.  Psychiatric:        Mood and Affect: Mood normal.        Thought Content: Thought content normal.     Data: Right Carotid Findings:  +----------+--------+--------+--------+------------------+--------+           PSV cm/sEDV cm/sStenosisPlaque DescriptionComments  +----------+--------+--------+--------+------------------+--------+  CCA Prox  118     6               heterogenous                +----------+--------+--------+--------+------------------+--------+  CCA Mid   71      9               heterogenous                +----------+--------+--------+--------+------------------+--------+  CCA Distal89      12              heterogenous                +----------+--------+--------+--------+------------------+--------+  ICA Prox  209     0               calcific  NWV       +----------+--------+--------+--------+------------------+--------+  ICA Mid   398     66      40-59%                              +----------+--------+--------+--------+------------------+--------+  ICA Distal60      18                                tortuous  +----------+--------+--------+--------+------------------+--------+  ECA      459     0       >50%    heterogenous                 +----------+--------+--------+--------+------------------+--------+   +----------+--------+-------+----------------+-------------------+           PSV cm/sEDV cmsDescribe        Arm Pressure (mmHG)  +----------+--------+-------+----------------+-------------------+  [id number]    19     Multiphasic, [id number]                  +----------+--------+-------+----------------+-------------------+   +---------+--------+--+--------+-+---------+  VertebralPSV cm/s64EDV cm/s0Antegrade  +---------+--------+--+--------+-+---------+      Left Carotid Findings:  +----------+--------+--------+--------+------------------+--------+           PSV cm/sEDV cm/sStenosisPlaque DescriptionComments  +----------+--------+--------+--------+------------------+--------+  CCA Prox  106     22              heterogenous                +----------+--------+--------+--------+------------------+--------+  CCA Mid   172     33              heterogenous                +----------+--------+--------+--------+------------------+--------+  CCA Distal178     28              heterogenous                +----------+--------+--------+--------+------------------+--------+  ICA Prox  158     45      40-59%  heterogenous                +----------+--------+--------+--------+------------------+--------+  ICA Mid   150     43      40-59%                              +----------+--------+--------+--------+------------------+--------+  ICA Distal139     44      40-59%                    curve     +----------+--------+--------+--------+------------------+--------+  ECA      506     0       >50%                                +----------+--------+--------+--------+------------------+--------+   +----------+--------+--------+--------+-------------------+           PSV cm/sEDV cm/sDescribeArm Pressure (mmHG)   +----------+--------+--------+--------+-------------------+  Subclavian70     8               78                   +----------+--------+--------+--------+-------------------+   +---------+--------+--+--------+-+  VertebralPSV cm/s57EDV cm/s1  +---------+--------+--+--------+-+  Summary:  Right Carotid: Velocities in the right ICA are consistent with a 60-79%                 stenosis. Extensive calcific plaque prevents thorough                 visualization. The ECA appears >50% stenosed.   Left Carotid: Velocities in the left ICA are consistent with a 40-59%  stenosis.               The ECA appears >50% stenosed.   Vertebrals:  Right vertebral artery demonstrates antegrade flow. Left  vertebral              artery demonstrates retrograde flow.  Subclavians: Normal flow hemodynamics were seen in the right subclavian  artery.              Monophasic left subclavian artery flow.      Assessment/Plan:    83 year old male with history as above.  1.  Carotid disease: We have discussed medical management versus carotid endarterectomy versus carotid stenting.  Given the highly calcific nature of his carotid disease we have elected to continue medical management.  We did discuss the signs and symptoms of stroke, TIA or amaurosis and he demonstrates good understanding and would need emergent medical evaluation.  2.  Peripheral arterial disease: Patient currently with known right common femoral artery occlusion and left SFA occlusion with heavily calcified common and external iliac arteries by most recent angiography.  ABIs in the past were severely depressed bilaterally.  Plan for CTA and repeat ABIs and follow-up after.  We discussed the need to continue walking and protecting his feet diligently.    Maeola Harman MD [facility]

## 2023-06-25 ENCOUNTER — Telehealth: Payer: Self-pay | Admitting: Nurse Practitioner

## 2023-06-25 NOTE — Telephone Encounter (Signed)
 Patient has not changed any medications recently he is taking everything as prescribed unless stated in med list.  Patient stopped taking amlodipine on 02/18/23 he states Dr.McDowell had him stop it Dr.Wert had him stop valsartan 10/24  Patient states his BP has begin being elevated for about 3 weeks now  Patient states his BP was 200/? At the doctor yesterday at 3 PM  Patient states he takes his BP before and after medications and provided the same numbers listed as earlier.  Advised patient to take his BP 2-3 hours after taking his BP medications and log it.  Patient states his son is a Teacher, early years/pre and is concerned as to why Dr.McDowell stopped patients Amlodipine.

## 2023-06-25 NOTE — Telephone Encounter (Signed)
 Pt c/o BP issue: STAT if pt c/o blurred vision, one-sided weakness or slurred speech.  STAT if BP is GREATER than 180/120 TODAY.  STAT if BP is LESS than 90/60 and SYMPTOMATIC TODAY  1. What is your BP concern? High  2. Have you taken any BP medication today?no  3. What are your last 5 BP readings? 148/66, 146/72, 145/69, 148/66, 147/70  4. Are you having any other symptoms (ex. Dizziness, headache, blurred vision, passed out)? no

## 2023-06-26 ENCOUNTER — Other Ambulatory Visit: Payer: Self-pay | Admitting: *Deleted

## 2023-06-26 DIAGNOSIS — I70221 Atherosclerosis of native arteries of extremities with rest pain, right leg: Secondary | ICD-10-CM

## 2023-06-26 NOTE — Telephone Encounter (Signed)
 Left voice mail to call back

## 2023-06-26 NOTE — Telephone Encounter (Signed)
 I spoke with Dr.Wert's CMA Annabell Sabal stated I don't see that in his last ov, we have not see him since 1/24 (in regards to patient's valsartan)  Looking back at that visit the medication was on patient List and was discontinue by Dr.Wert on 06/02/22 but does not give a reason as to why.

## 2023-06-26 NOTE — Telephone Encounter (Signed)
 Patient is Over due for follow up with Dr.Wert and is do for follow up in our office next month. Do you want patient to resume both valsartan and Amlodipine?

## 2023-06-26 NOTE — Telephone Encounter (Signed)
Pt calling for an update.

## 2023-06-26 NOTE — Telephone Encounter (Signed)
 Dr.Wert's CMA states that Valsartan was never D/C it was filled for enough until he could be seen I the office.

## 2023-06-26 NOTE — Telephone Encounter (Signed)
 Looking at visit on 06/24/23 Patient reported not taking amlodopine at OV with Dr.Cain

## 2023-06-29 MED ORDER — AMLODIPINE BESYLATE 5 MG PO TABS
5.0000 mg | ORAL_TABLET | Freq: Every day | ORAL | 0 refills | Status: DC
Start: 2023-06-29 — End: 2023-06-29

## 2023-06-29 MED ORDER — AMLODIPINE BESYLATE 5 MG PO TABS: 5.0000 mg | ORAL_TABLET | Freq: Every day | ORAL | 0 refills | Status: DC

## 2023-06-29 NOTE — Telephone Encounter (Signed)
 Patient informed and verbalized understanding of plan. Rx sent to patient preferred pharmacy for Amlodipine 5 Mg daily  Patient calling PCP to schedule;le appointment with their office Sending to our schedulers to schedule with either Dr.McDowell or APP shortly after visit with PCP patient is overdue to see [place].

## 2023-07-02 ENCOUNTER — Other Ambulatory Visit: Payer: Self-pay

## 2023-07-02 DIAGNOSIS — I70221 Atherosclerosis of native arteries of extremities with rest pain, right leg: Secondary | ICD-10-CM

## 2023-07-22 ENCOUNTER — Ambulatory Visit: Attending: Cardiology | Admitting: Cardiology

## 2023-07-22 ENCOUNTER — Encounter: Payer: Self-pay | Admitting: Cardiology

## 2023-07-22 VITALS — BP 148/54 | HR 80 | Ht 66.0 in | Wt 157.8 lb

## 2023-07-22 DIAGNOSIS — E782 Mixed hyperlipidemia: Secondary | ICD-10-CM | POA: Diagnosis not present

## 2023-07-22 DIAGNOSIS — I10 Essential (primary) hypertension: Secondary | ICD-10-CM | POA: Diagnosis not present

## 2023-07-22 DIAGNOSIS — I739 Peripheral vascular disease, unspecified: Secondary | ICD-10-CM | POA: Insufficient documentation

## 2023-07-22 DIAGNOSIS — I25119 Atherosclerotic heart disease of native coronary artery with unspecified angina pectoris: Secondary | ICD-10-CM | POA: Diagnosis not present

## 2023-07-22 DIAGNOSIS — Z79899 Other long term (current) drug therapy: Secondary | ICD-10-CM | POA: Diagnosis not present

## 2023-07-22 MED ORDER — LOSARTAN POTASSIUM 25 MG PO TABS: 25.0000 mg | ORAL_TABLET | Freq: Every day | ORAL | 1 refills | Status: DC

## 2023-07-22 NOTE — Patient Instructions (Addendum)
 Medication Instructions:  Your physician has recommended you make the following change in your medication:  Start losartan  25 mg daily Continue all other medications as prescribed  Labwork: BMET in 2 weeks. (Due around 08/05/2023) Non-fasting [facility] ([address] [place])  Testing/Procedures: none  Follow-Up: Your physician recommends that you schedule a follow-up appointment in: 6 months  Any Other Special Instructions Will Be Listed Below (If Applicable).  If you need a refill on your cardiac medications before your next appointment, please call your pharmacy.

## 2023-07-22 NOTE — Progress Notes (Signed)
 Cardiology Office Note  Date: 07/22/2023   ID: Pamelia Boehringer., DOB [birth year], MRN [id number]  History of Present Illness: Remberto Lienhard. is an 83 y.o. male last seen in October 2024 by Ms. Clementine Cutting NP, I reviewed the note.  I last saw him in March 2024.  He is here for a routine visit.  Interval chart reviewed including concerns about his antihypertensive regimen, still some confusion from my perspective at least about when exactly and by whom he was taken off Norvasc  and Diovan .  In the interim however he has resumed Norvasc  at 5 mg daily, we went over his home blood pressure measurements which generally show systolics in the 140s.  He is following with [facility], saw Dr. Vikki Graves recently with plan for peripheral CTA in early May.  It sounds like revascularization options are fairly limited however.  He continues to complain of claudication.  No active angina at this time or interval nitroglycerin  use.  We did go over the remainder of his cardiac medications.  Lipids have been well-controlled, his LDL was 35 in February.  We discussed adding back ARB at low dose in the form of Cozaar  25 mg daily as both complementary antihypertensive and also for renal protection.  Physical Exam: VS:  BP (!) 148/54   Pulse 80   Ht 5\' 6"  (1.676 m)   Wt 157 lb 12.8 oz (71.6 kg)   SpO2 93%   BMI 25.47 kg/m , BMI Body mass index is 25.47 kg/m.  Wt Readings from Last 3 Encounters:  07/22/23 157 lb 12.8 oz (71.6 kg)  06/24/23 155 lb (70.3 kg)  05/06/23 161 lb (73 kg)    General: Patient appears comfortable at rest. HEENT: Conjunctiva and lids normal. Neck: Supple, no elevated JVP. Lungs: Clear to auscultation, nonlabored breathing at rest. Cardiac: Regular rate and rhythm, no S3, 1/6 systolic murmur. Extremities: No pitting edema, decreased distal pulses, no ulcerations.  ECG:  An ECG dated 01/05/2023 was personally reviewed today and demonstrated:  Sinus rhythm with nonspecific ST  changes.  Labwork: 03/05/2023: Hemoglobin 13.7; Platelets 281 05/06/2023: ALT 18; AST 23; BUN 18; Creatinine, Ser 1.18; Potassium 4.5; Sodium 140; TSH 1.98     Component Value Date/Time   CHOL 102 05/06/2023 1546   TRIG 127.0 05/06/2023 1546   HDL 41.60 05/06/2023 1546   CHOLHDL 2 05/06/2023 1546   VLDL 25.4 05/06/2023 1546   LDLCALC 35 05/06/2023 1546   LDLCALC 81 07/04/2020 1018   Other Studies Reviewed Today:  Carotid Dopplers 06/24/2023: Summary:  Right Carotid: Velocities in the right ICA are consistent with a 60-79%                 stenosis. Extensive calcific plaque prevents thorough                 visualization. The ECA appears >50% stenosed.   Left Carotid: Velocities in the left ICA are consistent with a 40-59%  stenosis.               The ECA appears >50% stenosed.   Vertebrals:  Right vertebral artery demonstrates antegrade flow. Left  vertebral              artery demonstrates retrograde flow.  Subclavians: Normal flow hemodynamics were seen in the right subclavian  artery.              Monophasic left subclavian artery flow.   Assessment and Plan:  1.  Multivessel CAD  status post CABG with LIMA to LAD and SVG to OM1 in 2017.  He has subsequently documented graft disease and ultimately underwent placement of DES x 3 overlapping within the proximal to distal RCA in September 2022 with otherwise limited revascularization options.  LVEF 65 to 70% by echocardiogram in July 2023.  Not describe any worsening angina or interval nitroglycerin  use.  Plan to continue medical therapy and observation for now.  He is on aspirin  81 mg daily, Brilinta  60 mg twice daily, Zetia  10 mg daily, Imdur  60 mg daily, Lopressor  50 mg twice daily, Crestor  10 mg daily, and as needed nitroglycerin .   2.  Mixed hyperlipidemia.  LDL 35 in February.  Continue Crestor  10 mg daily and Zetia  10 mg daily.   3.  PAD and carotid artery disease, followed by [facility].  March office note from Dr. Vikki Graves reviewed.   He is being managed medically at this time with limited revascularization options.  He is scheduled for peripheral CTA in early May by Dr. Vikki Graves.  Has known right common femoral artery occlusion and left SFA occlusion with heavily calcified common and external iliac arteries.  Continue antiplatelet regimen and statin.   4.  Primary hypertension.  Per discussion today he is taking Norvasc  5 mg daily along with Lopressor  50 mg twice daily.  Has not been on Diovan  for quite some time.  Plan to start Cozaar  25 mg daily, continue to track blood pressure at home.  Would check BMET in 2 weeks.   5.  CKD stage 3a.  Creatinine 1.18 with GFR 57 in February.  Disposition:  Follow up  6 months.  Signed, Gerard Knight, M.D., F.A.C.C. [facility]

## 2023-07-28 DIAGNOSIS — R0989 Other specified symptoms and signs involving the circulatory and respiratory systems: Secondary | ICD-10-CM | POA: Diagnosis not present

## 2023-07-28 DIAGNOSIS — J189 Pneumonia, unspecified organism: Secondary | ICD-10-CM | POA: Diagnosis not present

## 2023-07-28 DIAGNOSIS — S3219XA Other fracture of sacrum, initial encounter for closed fracture: Secondary | ICD-10-CM | POA: Diagnosis not present

## 2023-07-28 DIAGNOSIS — E279 Disorder of adrenal gland, unspecified: Secondary | ICD-10-CM | POA: Diagnosis not present

## 2023-07-28 DIAGNOSIS — J90 Pleural effusion, not elsewhere classified: Secondary | ICD-10-CM | POA: Diagnosis not present

## 2023-07-28 DIAGNOSIS — S32059A Unspecified fracture of fifth lumbar vertebra, initial encounter for closed fracture: Secondary | ICD-10-CM | POA: Diagnosis not present

## 2023-07-28 DIAGNOSIS — S2249XA Multiple fractures of ribs, unspecified side, initial encounter for closed fracture: Secondary | ICD-10-CM | POA: Diagnosis not present

## 2023-07-28 DIAGNOSIS — S32592A Other specified fracture of left pubis, initial encounter for closed fracture: Secondary | ICD-10-CM | POA: Diagnosis not present

## 2023-07-28 DIAGNOSIS — I2510 Atherosclerotic heart disease of native coronary artery without angina pectoris: Secondary | ICD-10-CM | POA: Diagnosis not present

## 2023-07-28 DIAGNOSIS — R918 Other nonspecific abnormal finding of lung field: Secondary | ICD-10-CM | POA: Diagnosis not present

## 2023-07-28 DIAGNOSIS — S32502A Unspecified fracture of left pubis, initial encounter for closed fracture: Secondary | ICD-10-CM | POA: Diagnosis not present

## 2023-07-28 DIAGNOSIS — S32009A Unspecified fracture of unspecified lumbar vertebra, initial encounter for closed fracture: Secondary | ICD-10-CM | POA: Diagnosis not present

## 2023-07-28 DIAGNOSIS — M25562 Pain in left knee: Secondary | ICD-10-CM | POA: Diagnosis not present

## 2023-07-28 DIAGNOSIS — I3481 Nonrheumatic mitral (valve) annulus calcification: Secondary | ICD-10-CM | POA: Diagnosis not present

## 2023-07-28 DIAGNOSIS — K661 Hemoperitoneum: Secondary | ICD-10-CM | POA: Diagnosis present

## 2023-07-28 DIAGNOSIS — N1831 Chronic kidney disease, stage 3a: Secondary | ICD-10-CM | POA: Diagnosis present

## 2023-07-28 DIAGNOSIS — S35329A Unspecified injury of splenic vein, initial encounter: Secondary | ICD-10-CM | POA: Diagnosis present

## 2023-07-28 DIAGNOSIS — N320 Bladder-neck obstruction: Secondary | ICD-10-CM | POA: Diagnosis not present

## 2023-07-28 DIAGNOSIS — I959 Hypotension, unspecified: Secondary | ICD-10-CM | POA: Diagnosis present

## 2023-07-28 DIAGNOSIS — I10 Essential (primary) hypertension: Secondary | ICD-10-CM | POA: Diagnosis not present

## 2023-07-28 DIAGNOSIS — E162 Hypoglycemia, unspecified: Secondary | ICD-10-CM | POA: Diagnosis not present

## 2023-07-28 DIAGNOSIS — G8918 Other acute postprocedural pain: Secondary | ICD-10-CM | POA: Diagnosis not present

## 2023-07-28 DIAGNOSIS — S01511A Laceration without foreign body of lip, initial encounter: Secondary | ICD-10-CM | POA: Diagnosis not present

## 2023-07-28 DIAGNOSIS — Z23 Encounter for immunization: Secondary | ICD-10-CM | POA: Diagnosis not present

## 2023-07-28 DIAGNOSIS — D649 Anemia, unspecified: Secondary | ICD-10-CM | POA: Diagnosis not present

## 2023-07-28 DIAGNOSIS — Z7982 Long term (current) use of aspirin: Secondary | ICD-10-CM | POA: Diagnosis not present

## 2023-07-28 DIAGNOSIS — S80211A Abrasion, right knee, initial encounter: Secondary | ICD-10-CM | POA: Diagnosis not present

## 2023-07-28 DIAGNOSIS — M7989 Other specified soft tissue disorders: Secondary | ICD-10-CM | POA: Diagnosis not present

## 2023-07-28 DIAGNOSIS — I723 Aneurysm of iliac artery: Secondary | ICD-10-CM | POA: Diagnosis not present

## 2023-07-28 DIAGNOSIS — J9691 Respiratory failure, unspecified with hypoxia: Secondary | ICD-10-CM | POA: Diagnosis not present

## 2023-07-28 DIAGNOSIS — J449 Chronic obstructive pulmonary disease, unspecified: Secondary | ICD-10-CM | POA: Diagnosis not present

## 2023-07-28 DIAGNOSIS — E039 Hypothyroidism, unspecified: Secondary | ICD-10-CM | POA: Diagnosis present

## 2023-07-28 DIAGNOSIS — J9601 Acute respiratory failure with hypoxia: Secondary | ICD-10-CM | POA: Diagnosis not present

## 2023-07-28 DIAGNOSIS — S27321A Contusion of lung, unilateral, initial encounter: Secondary | ICD-10-CM | POA: Diagnosis not present

## 2023-07-28 DIAGNOSIS — E785 Hyperlipidemia, unspecified: Secondary | ICD-10-CM | POA: Diagnosis not present

## 2023-07-28 DIAGNOSIS — S27322A Contusion of lung, bilateral, initial encounter: Secondary | ICD-10-CM | POA: Diagnosis present

## 2023-07-28 DIAGNOSIS — E2749 Other adrenocortical insufficiency: Secondary | ICD-10-CM | POA: Diagnosis present

## 2023-07-28 DIAGNOSIS — J9811 Atelectasis: Secondary | ICD-10-CM | POA: Diagnosis not present

## 2023-07-28 DIAGNOSIS — R079 Chest pain, unspecified: Secondary | ICD-10-CM | POA: Diagnosis not present

## 2023-07-28 DIAGNOSIS — D72829 Elevated white blood cell count, unspecified: Secondary | ICD-10-CM | POA: Diagnosis not present

## 2023-07-28 DIAGNOSIS — R0603 Acute respiratory distress: Secondary | ICD-10-CM | POA: Diagnosis not present

## 2023-07-28 DIAGNOSIS — S2241XA Multiple fractures of ribs, right side, initial encounter for closed fracture: Secondary | ICD-10-CM | POA: Diagnosis not present

## 2023-07-28 DIAGNOSIS — S32058A Other fracture of fifth lumbar vertebra, initial encounter for closed fracture: Secondary | ICD-10-CM | POA: Diagnosis not present

## 2023-07-28 DIAGNOSIS — S32119A Unspecified Zone I fracture of sacrum, initial encounter for closed fracture: Secondary | ICD-10-CM | POA: Diagnosis present

## 2023-07-28 DIAGNOSIS — J984 Other disorders of lung: Secondary | ICD-10-CM | POA: Diagnosis not present

## 2023-07-28 DIAGNOSIS — S37812A Contusion of adrenal gland, initial encounter: Secondary | ICD-10-CM | POA: Diagnosis not present

## 2023-07-28 DIAGNOSIS — R339 Retention of urine, unspecified: Secondary | ICD-10-CM | POA: Diagnosis present

## 2023-07-28 DIAGNOSIS — G458 Other transient cerebral ischemic attacks and related syndromes: Secondary | ICD-10-CM | POA: Diagnosis present

## 2023-07-28 DIAGNOSIS — E872 Acidosis, unspecified: Secondary | ICD-10-CM | POA: Diagnosis not present

## 2023-07-28 DIAGNOSIS — Z955 Presence of coronary angioplasty implant and graft: Secondary | ICD-10-CM | POA: Diagnosis not present

## 2023-07-28 DIAGNOSIS — S0011XA Contusion of right eyelid and periocular area, initial encounter: Secondary | ICD-10-CM | POA: Diagnosis not present

## 2023-07-28 DIAGNOSIS — S36899A Unspecified injury of other intra-abdominal organs, initial encounter: Secondary | ICD-10-CM | POA: Diagnosis not present

## 2023-07-28 DIAGNOSIS — S80212A Abrasion, left knee, initial encounter: Secondary | ICD-10-CM | POA: Diagnosis not present

## 2023-07-28 DIAGNOSIS — S3210XA Unspecified fracture of sacrum, initial encounter for closed fracture: Secondary | ICD-10-CM | POA: Diagnosis not present

## 2023-07-28 DIAGNOSIS — R319 Hematuria, unspecified: Secondary | ICD-10-CM | POA: Diagnosis not present

## 2023-07-28 DIAGNOSIS — S32110A Nondisplaced Zone I fracture of sacrum, initial encounter for closed fracture: Secondary | ICD-10-CM | POA: Diagnosis not present

## 2023-07-28 DIAGNOSIS — Z87891 Personal history of nicotine dependence: Secondary | ICD-10-CM | POA: Diagnosis not present

## 2023-07-28 DIAGNOSIS — S0990XA Unspecified injury of head, initial encounter: Secondary | ICD-10-CM | POA: Diagnosis not present

## 2023-07-28 DIAGNOSIS — Z9911 Dependence on respirator [ventilator] status: Secondary | ICD-10-CM | POA: Diagnosis not present

## 2023-07-28 DIAGNOSIS — R0689 Other abnormalities of breathing: Secondary | ICD-10-CM | POA: Diagnosis not present

## 2023-07-28 DIAGNOSIS — I129 Hypertensive chronic kidney disease with stage 1 through stage 4 chronic kidney disease, or unspecified chronic kidney disease: Secondary | ICD-10-CM | POA: Diagnosis present

## 2023-07-28 DIAGNOSIS — I161 Hypertensive emergency: Secondary | ICD-10-CM | POA: Diagnosis not present

## 2023-07-28 DIAGNOSIS — Z043 Encounter for examination and observation following other accident: Secondary | ICD-10-CM | POA: Diagnosis not present

## 2023-07-28 DIAGNOSIS — S37819A Unspecified injury of adrenal gland, initial encounter: Secondary | ICD-10-CM | POA: Diagnosis not present

## 2023-07-28 DIAGNOSIS — S37818A Other injury of adrenal gland, initial encounter: Secondary | ICD-10-CM | POA: Diagnosis not present

## 2023-07-28 DIAGNOSIS — R7401 Elevation of levels of liver transaminase levels: Secondary | ICD-10-CM | POA: Diagnosis not present

## 2023-07-28 DIAGNOSIS — J9602 Acute respiratory failure with hypercapnia: Secondary | ICD-10-CM | POA: Diagnosis not present

## 2023-07-28 DIAGNOSIS — S0511XA Contusion of eyeball and orbital tissues, right eye, initial encounter: Secondary | ICD-10-CM | POA: Diagnosis not present

## 2023-07-28 DIAGNOSIS — Z9989 Dependence on other enabling machines and devices: Secondary | ICD-10-CM | POA: Diagnosis not present

## 2023-07-28 DIAGNOSIS — I517 Cardiomegaly: Secondary | ICD-10-CM | POA: Diagnosis not present

## 2023-07-28 DIAGNOSIS — R109 Unspecified abdominal pain: Secondary | ICD-10-CM | POA: Diagnosis not present

## 2023-07-28 DIAGNOSIS — D62 Acute posthemorrhagic anemia: Secondary | ICD-10-CM | POA: Diagnosis not present

## 2023-07-28 DIAGNOSIS — G4733 Obstructive sleep apnea (adult) (pediatric): Secondary | ICD-10-CM | POA: Diagnosis not present

## 2023-07-28 DIAGNOSIS — R4182 Altered mental status, unspecified: Secondary | ICD-10-CM | POA: Diagnosis not present

## 2023-07-28 DIAGNOSIS — R739 Hyperglycemia, unspecified: Secondary | ICD-10-CM | POA: Diagnosis not present

## 2023-07-28 DIAGNOSIS — S27329A Contusion of lung, unspecified, initial encounter: Secondary | ICD-10-CM | POA: Diagnosis not present

## 2023-07-28 DIAGNOSIS — M1712 Unilateral primary osteoarthritis, left knee: Secondary | ICD-10-CM | POA: Diagnosis not present

## 2023-07-28 DIAGNOSIS — S3792XA Contusion of unspecified urinary and pelvic organ, initial encounter: Secondary | ICD-10-CM | POA: Diagnosis not present

## 2023-07-28 DIAGNOSIS — S41112A Laceration without foreign body of left upper arm, initial encounter: Secondary | ICD-10-CM | POA: Diagnosis not present

## 2023-07-28 DIAGNOSIS — Z951 Presence of aortocoronary bypass graft: Secondary | ICD-10-CM | POA: Diagnosis not present

## 2023-07-28 DIAGNOSIS — R0602 Shortness of breath: Secondary | ICD-10-CM | POA: Diagnosis not present

## 2023-07-28 DIAGNOSIS — I708 Atherosclerosis of other arteries: Secondary | ICD-10-CM | POA: Diagnosis not present

## 2023-07-29 ENCOUNTER — Telehealth: Payer: Self-pay | Admitting: Cardiology

## 2023-07-29 NOTE — Telephone Encounter (Signed)
I did not need this encounter. °

## 2023-08-03 ENCOUNTER — Other Ambulatory Visit: Payer: Self-pay

## 2023-08-03 DIAGNOSIS — I70221 Atherosclerosis of native arteries of extremities with rest pain, right leg: Secondary | ICD-10-CM

## 2023-08-05 ENCOUNTER — Ambulatory Visit: Admitting: Vascular Surgery

## 2023-08-06 ENCOUNTER — Ambulatory Visit (HOSPITAL_COMMUNITY)

## 2023-08-11 DIAGNOSIS — D509 Iron deficiency anemia, unspecified: Secondary | ICD-10-CM | POA: Diagnosis not present

## 2023-08-11 DIAGNOSIS — Z7982 Long term (current) use of aspirin: Secondary | ICD-10-CM | POA: Diagnosis not present

## 2023-08-11 DIAGNOSIS — Z95820 Peripheral vascular angioplasty status with implants and grafts: Secondary | ICD-10-CM | POA: Diagnosis not present

## 2023-08-11 DIAGNOSIS — R6521 Severe sepsis with septic shock: Secondary | ICD-10-CM | POA: Diagnosis not present

## 2023-08-11 DIAGNOSIS — J449 Chronic obstructive pulmonary disease, unspecified: Secondary | ICD-10-CM | POA: Diagnosis not present

## 2023-08-11 DIAGNOSIS — S32058D Other fracture of fifth lumbar vertebra, subsequent encounter for fracture with routine healing: Secondary | ICD-10-CM | POA: Diagnosis not present

## 2023-08-11 DIAGNOSIS — J9601 Acute respiratory failure with hypoxia: Secondary | ICD-10-CM | POA: Diagnosis not present

## 2023-08-11 DIAGNOSIS — S2241XD Multiple fractures of ribs, right side, subsequent encounter for fracture with routine healing: Secondary | ICD-10-CM | POA: Diagnosis not present

## 2023-08-11 DIAGNOSIS — S32058A Other fracture of fifth lumbar vertebra, initial encounter for closed fracture: Secondary | ICD-10-CM | POA: Diagnosis not present

## 2023-08-11 DIAGNOSIS — S3210XA Unspecified fracture of sacrum, initial encounter for closed fracture: Secondary | ICD-10-CM | POA: Diagnosis not present

## 2023-08-11 DIAGNOSIS — N320 Bladder-neck obstruction: Secondary | ICD-10-CM | POA: Diagnosis not present

## 2023-08-11 DIAGNOSIS — I10 Essential (primary) hypertension: Secondary | ICD-10-CM | POA: Diagnosis not present

## 2023-08-11 DIAGNOSIS — S32436S Nondisplaced fracture of anterior column [iliopubic] of unspecified acetabulum, sequela: Secondary | ICD-10-CM | POA: Diagnosis not present

## 2023-08-11 DIAGNOSIS — S32502A Unspecified fracture of left pubis, initial encounter for closed fracture: Secondary | ICD-10-CM | POA: Diagnosis not present

## 2023-08-11 DIAGNOSIS — N179 Acute kidney failure, unspecified: Secondary | ICD-10-CM | POA: Diagnosis not present

## 2023-08-11 DIAGNOSIS — K219 Gastro-esophageal reflux disease without esophagitis: Secondary | ICD-10-CM | POA: Diagnosis not present

## 2023-08-11 DIAGNOSIS — Z87891 Personal history of nicotine dependence: Secondary | ICD-10-CM | POA: Diagnosis not present

## 2023-08-11 DIAGNOSIS — R2689 Other abnormalities of gait and mobility: Secondary | ICD-10-CM | POA: Diagnosis not present

## 2023-08-11 DIAGNOSIS — E785 Hyperlipidemia, unspecified: Secondary | ICD-10-CM | POA: Diagnosis not present

## 2023-08-11 DIAGNOSIS — N183 Chronic kidney disease, stage 3 unspecified: Secondary | ICD-10-CM | POA: Diagnosis not present

## 2023-08-11 DIAGNOSIS — Z66 Do not resuscitate: Secondary | ICD-10-CM | POA: Diagnosis not present

## 2023-08-11 DIAGNOSIS — Z7409 Other reduced mobility: Secondary | ICD-10-CM | POA: Diagnosis not present

## 2023-08-11 DIAGNOSIS — I129 Hypertensive chronic kidney disease with stage 1 through stage 4 chronic kidney disease, or unspecified chronic kidney disease: Secondary | ICD-10-CM | POA: Diagnosis not present

## 2023-08-11 DIAGNOSIS — I739 Peripheral vascular disease, unspecified: Secondary | ICD-10-CM | POA: Diagnosis not present

## 2023-08-11 DIAGNOSIS — E872 Acidosis, unspecified: Secondary | ICD-10-CM | POA: Diagnosis not present

## 2023-08-11 DIAGNOSIS — S37818D Other injury of adrenal gland, subsequent encounter: Secondary | ICD-10-CM | POA: Diagnosis not present

## 2023-08-11 DIAGNOSIS — G4733 Obstructive sleep apnea (adult) (pediatric): Secondary | ICD-10-CM | POA: Diagnosis not present

## 2023-08-11 DIAGNOSIS — Z515 Encounter for palliative care: Secondary | ICD-10-CM | POA: Diagnosis not present

## 2023-08-11 DIAGNOSIS — E039 Hypothyroidism, unspecified: Secondary | ICD-10-CM | POA: Diagnosis not present

## 2023-08-11 DIAGNOSIS — M79641 Pain in right hand: Secondary | ICD-10-CM | POA: Diagnosis not present

## 2023-08-11 DIAGNOSIS — Z452 Encounter for adjustment and management of vascular access device: Secondary | ICD-10-CM | POA: Diagnosis not present

## 2023-08-11 DIAGNOSIS — I2510 Atherosclerotic heart disease of native coronary artery without angina pectoris: Secondary | ICD-10-CM | POA: Diagnosis not present

## 2023-08-11 DIAGNOSIS — E8721 Acute metabolic acidosis: Secondary | ICD-10-CM | POA: Diagnosis not present

## 2023-08-11 DIAGNOSIS — R1310 Dysphagia, unspecified: Secondary | ICD-10-CM | POA: Diagnosis not present

## 2023-08-11 DIAGNOSIS — R040 Epistaxis: Secondary | ICD-10-CM | POA: Diagnosis not present

## 2023-08-11 DIAGNOSIS — E782 Mixed hyperlipidemia: Secondary | ICD-10-CM | POA: Diagnosis not present

## 2023-08-11 DIAGNOSIS — S27329A Contusion of lung, unspecified, initial encounter: Secondary | ICD-10-CM | POA: Diagnosis not present

## 2023-08-11 DIAGNOSIS — Z955 Presence of coronary angioplasty implant and graft: Secondary | ICD-10-CM | POA: Diagnosis not present

## 2023-08-11 DIAGNOSIS — I252 Old myocardial infarction: Secondary | ICD-10-CM | POA: Diagnosis not present

## 2023-08-11 DIAGNOSIS — A419 Sepsis, unspecified organism: Secondary | ICD-10-CM | POA: Diagnosis not present

## 2023-08-11 DIAGNOSIS — S2241XA Multiple fractures of ribs, right side, initial encounter for closed fracture: Secondary | ICD-10-CM | POA: Diagnosis not present

## 2023-08-11 DIAGNOSIS — S27321A Contusion of lung, unilateral, initial encounter: Secondary | ICD-10-CM | POA: Diagnosis not present

## 2023-08-11 DIAGNOSIS — N1831 Chronic kidney disease, stage 3a: Secondary | ICD-10-CM | POA: Diagnosis not present

## 2023-08-11 DIAGNOSIS — S27329D Contusion of lung, unspecified, subsequent encounter: Secondary | ICD-10-CM | POA: Diagnosis not present

## 2023-08-11 DIAGNOSIS — S3219XD Other fracture of sacrum, subsequent encounter for fracture with routine healing: Secondary | ICD-10-CM | POA: Diagnosis not present

## 2023-08-11 DIAGNOSIS — S0689AD Other specified intracranial injury with loss of consciousness status unknown, subsequent encounter: Secondary | ICD-10-CM | POA: Diagnosis not present

## 2023-08-11 DIAGNOSIS — Z951 Presence of aortocoronary bypass graft: Secondary | ICD-10-CM | POA: Diagnosis not present

## 2023-08-12 ENCOUNTER — Ambulatory Visit: Admitting: Vascular Surgery

## 2023-08-12 ENCOUNTER — Encounter (HOSPITAL_COMMUNITY)

## 2023-08-16 DIAGNOSIS — K219 Gastro-esophageal reflux disease without esophagitis: Secondary | ICD-10-CM | POA: Diagnosis not present

## 2023-08-16 DIAGNOSIS — R6521 Severe sepsis with septic shock: Secondary | ICD-10-CM | POA: Diagnosis not present

## 2023-08-16 DIAGNOSIS — E039 Hypothyroidism, unspecified: Secondary | ICD-10-CM | POA: Diagnosis not present

## 2023-08-16 DIAGNOSIS — S2241XD Multiple fractures of ribs, right side, subsequent encounter for fracture with routine healing: Secondary | ICD-10-CM | POA: Diagnosis not present

## 2023-08-16 DIAGNOSIS — N320 Bladder-neck obstruction: Secondary | ICD-10-CM | POA: Diagnosis not present

## 2023-08-16 DIAGNOSIS — R1310 Dysphagia, unspecified: Secondary | ICD-10-CM | POA: Diagnosis not present

## 2023-08-16 DIAGNOSIS — R040 Epistaxis: Secondary | ICD-10-CM | POA: Diagnosis not present

## 2023-08-16 DIAGNOSIS — I129 Hypertensive chronic kidney disease with stage 1 through stage 4 chronic kidney disease, or unspecified chronic kidney disease: Secondary | ICD-10-CM | POA: Diagnosis not present

## 2023-08-16 DIAGNOSIS — R2689 Other abnormalities of gait and mobility: Secondary | ICD-10-CM | POA: Diagnosis not present

## 2023-08-16 DIAGNOSIS — J9601 Acute respiratory failure with hypoxia: Secondary | ICD-10-CM | POA: Diagnosis not present

## 2023-08-16 DIAGNOSIS — J449 Chronic obstructive pulmonary disease, unspecified: Secondary | ICD-10-CM | POA: Diagnosis not present

## 2023-08-16 DIAGNOSIS — A419 Sepsis, unspecified organism: Secondary | ICD-10-CM | POA: Diagnosis not present

## 2023-08-16 DIAGNOSIS — E872 Acidosis, unspecified: Secondary | ICD-10-CM | POA: Diagnosis not present

## 2023-08-16 DIAGNOSIS — I739 Peripheral vascular disease, unspecified: Secondary | ICD-10-CM | POA: Diagnosis not present

## 2023-08-16 DIAGNOSIS — S3219XD Other fracture of sacrum, subsequent encounter for fracture with routine healing: Secondary | ICD-10-CM | POA: Diagnosis not present

## 2023-08-16 DIAGNOSIS — G4733 Obstructive sleep apnea (adult) (pediatric): Secondary | ICD-10-CM | POA: Diagnosis not present

## 2023-08-16 DIAGNOSIS — N1831 Chronic kidney disease, stage 3a: Secondary | ICD-10-CM | POA: Diagnosis not present

## 2023-08-16 DIAGNOSIS — S32058D Other fracture of fifth lumbar vertebra, subsequent encounter for fracture with routine healing: Secondary | ICD-10-CM | POA: Diagnosis not present

## 2023-08-16 DIAGNOSIS — S37818D Other injury of adrenal gland, subsequent encounter: Secondary | ICD-10-CM | POA: Diagnosis not present

## 2023-08-16 DIAGNOSIS — N179 Acute kidney failure, unspecified: Secondary | ICD-10-CM | POA: Diagnosis not present

## 2023-08-16 DIAGNOSIS — E785 Hyperlipidemia, unspecified: Secondary | ICD-10-CM | POA: Diagnosis not present

## 2023-08-16 DIAGNOSIS — Z515 Encounter for palliative care: Secondary | ICD-10-CM | POA: Diagnosis not present

## 2023-08-16 DIAGNOSIS — S27329D Contusion of lung, unspecified, subsequent encounter: Secondary | ICD-10-CM | POA: Diagnosis not present

## 2023-08-16 DIAGNOSIS — I2510 Atherosclerotic heart disease of native coronary artery without angina pectoris: Secondary | ICD-10-CM | POA: Diagnosis not present

## 2023-08-16 DIAGNOSIS — Z66 Do not resuscitate: Secondary | ICD-10-CM | POA: Diagnosis not present

## 2023-08-19 ENCOUNTER — Telehealth: Payer: Self-pay | Admitting: Family Medicine

## 2023-08-19 NOTE — Telephone Encounter (Signed)
 Called to r/s 10/19/23 appt due to NP being out. Spoke with a family member of patient who stated he is deceased. I have cancelled all appointments for [facility]. Angie, can you mark his chart? Thank you!

## 2023-08-28 ENCOUNTER — Other Ambulatory Visit: Payer: Self-pay | Admitting: Cardiology

## 2023-08-30 DEATH — deceased

## 2023-09-02 ENCOUNTER — Inpatient Hospital Stay: Payer: Medicare Other

## 2023-09-09 ENCOUNTER — Inpatient Hospital Stay: Payer: Medicare Other | Admitting: Hematology

## 2023-09-24 ENCOUNTER — Other Ambulatory Visit: Payer: Self-pay | Admitting: Cardiology

## 2023-10-19 ENCOUNTER — Ambulatory Visit: Payer: Medicare Other | Admitting: Family Medicine

## 2024-02-19 ENCOUNTER — Telehealth: Payer: Self-pay

## 2024-02-19 NOTE — Telephone Encounter (Signed)
 Letter sent to patient's home address in order to prompt to call our office for scheduling of CT Scan.  Patient saw Dr. Sheree on 3/26 but has not had the ordered CT Scan yet.    CT Scan sheet sent to scanning.
